# Patient Record
Sex: Male | Born: 1985 | Race: White | Hispanic: No | Marital: Single | Smoking: Current every day smoker
Health system: Southern US, Community
[De-identification: ages and names within clinical notes are randomized; demographics above are authoritative.]

## PROBLEM LIST (undated history)

## (undated) DIAGNOSIS — F1220 Cannabis dependence, uncomplicated: Secondary | ICD-10-CM

## (undated) DIAGNOSIS — E109 Type 1 diabetes mellitus without complications: Secondary | ICD-10-CM

## (undated) DIAGNOSIS — F12188 Cannabis abuse with other cannabis-induced disorder: Secondary | ICD-10-CM

## (undated) HISTORY — PX: TONSILLECTOMY: SUR1361

---

## 2014-10-04 ENCOUNTER — Inpatient Hospital Stay (HOSPITAL_COMMUNITY): Payer: Self-pay

## 2014-10-04 ENCOUNTER — Encounter (HOSPITAL_COMMUNITY): Payer: Self-pay | Admitting: Emergency Medicine

## 2014-10-04 ENCOUNTER — Inpatient Hospital Stay (HOSPITAL_COMMUNITY)
Admission: EM | Admit: 2014-10-04 | Discharge: 2014-10-04 | DRG: 639 | Disposition: A | Discharge status: Home / Self Care | Payer: Self-pay | Attending: Internal Medicine | Admitting: Internal Medicine

## 2014-10-04 DIAGNOSIS — Z794 Long term (current) use of insulin: Secondary | ICD-10-CM

## 2014-10-04 DIAGNOSIS — E860 Dehydration: Secondary | ICD-10-CM | POA: Diagnosis present

## 2014-10-04 DIAGNOSIS — D72829 Elevated white blood cell count, unspecified: Secondary | ICD-10-CM | POA: Diagnosis present

## 2014-10-04 DIAGNOSIS — E119 Type 2 diabetes mellitus without complications: Secondary | ICD-10-CM

## 2014-10-04 DIAGNOSIS — F17210 Nicotine dependence, cigarettes, uncomplicated: Secondary | ICD-10-CM | POA: Diagnosis present

## 2014-10-04 DIAGNOSIS — E872 Acidosis, unspecified: Secondary | ICD-10-CM

## 2014-10-04 DIAGNOSIS — E1110 Type 2 diabetes mellitus with ketoacidosis without coma: Secondary | ICD-10-CM | POA: Diagnosis present

## 2014-10-04 DIAGNOSIS — E1310 Other specified diabetes mellitus with ketoacidosis without coma: Principal | ICD-10-CM | POA: Diagnosis present

## 2014-10-04 DIAGNOSIS — IMO0001 Reserved for inherently not codable concepts without codable children: Secondary | ICD-10-CM

## 2014-10-04 LAB — COMPREHENSIVE METABOLIC PANEL
ALBUMIN: 4.7 g/dL (ref 3.5–5.2)
ALT: 13 U/L (ref 0–53)
ANION GAP: 33 — AB (ref 5–15)
AST: 14 U/L (ref 0–37)
Alkaline Phosphatase: 102 U/L (ref 39–117)
BUN: 21 mg/dL (ref 6–23)
CALCIUM: 9.6 mg/dL (ref 8.4–10.5)
CO2: 8 mEq/L — CL (ref 19–32)
CREATININE: 0.86 mg/dL (ref 0.50–1.35)
Chloride: 91 mEq/L — ABNORMAL LOW (ref 96–112)
GFR calc Af Amer: >90 mL/min (ref >90)
GFR calc non Af Amer: >90 mL/min (ref >90)
Glucose, Bld: 348 mg/dL — ABNORMAL HIGH (ref 70–99)
Potassium: 5.1 mEq/L (ref 3.7–5.3)
Sodium: 132 mEq/L — ABNORMAL LOW (ref 137–147)
Total Bilirubin: 0.9 mg/dL (ref 0.3–1.2)
Total Protein: 7.9 g/dL (ref 6.0–8.3)

## 2014-10-04 LAB — BLOOD GAS, VENOUS
Acid-base deficit: 22.5 mmol/L — ABNORMAL HIGH (ref 0.0–2.0)
Bicarbonate: 4.9 mEq/L — ABNORMAL LOW (ref 20.0–24.0)
O2 SAT: 96.1 %
PH VEN: 7.17 — AB (ref 7.250–7.300)
PO2 VEN: 95.2 mmHg — AB (ref 30.0–45.0)
Patient temperature: 98.6
TCO2: 4.8 mmol/L (ref 0–100)
pCO2, Ven: 14 mmHg — ABNORMAL LOW (ref 45.0–50.0)

## 2014-10-04 LAB — CBC
HEMATOCRIT: 48.7 % (ref 39.0–52.0)
HEMATOCRIT: 45.8 % (ref 39.0–52.0)
Hemoglobin: 15.9 g/dL (ref 13.0–17.0)
Hemoglobin: 14.7 g/dL (ref 13.0–17.0)
MCH: 31.1 pg (ref 26.0–34.0)
MCH: 30.6 pg (ref 26.0–34.0)
MCHC: 32.6 g/dL (ref 30.0–36.0)
MCHC: 32.1 g/dL (ref 30.0–36.0)
MCV: 95.3 fL (ref 78.0–100.0)
MCV: 95.4 fL (ref 78.0–100.0)
Platelets: 231 10*3/uL (ref 150–400)
Platelets: 197 10*3/uL (ref 150–400)
RBC: 5.11 MIL/uL (ref 4.22–5.81)
RBC: 4.8 MIL/uL (ref 4.22–5.81)
RDW: 13.1 % (ref 11.5–15.5)
RDW: 12.9 % (ref 11.5–15.5)
WBC: 11.4 10*3/uL — ABNORMAL HIGH (ref 4.0–10.5)
WBC: 11.8 10*3/uL — AB (ref 4.0–10.5)

## 2014-10-04 LAB — URINALYSIS, ROUTINE W REFLEX MICROSCOPIC
Bilirubin Urine: NEGATIVE
Glucose, UA: >1000 mg/dL — AB
Hgb urine dipstick: NEGATIVE
LEUKOCYTES UA: NEGATIVE
NITRITE: NEGATIVE
Protein, ur: NEGATIVE mg/dL
Specific Gravity, Urine: 1.028 (ref 1.005–1.030)
Urobilinogen, UA: 0.2 mg/dL (ref 0.0–1.0)
pH: 5 (ref 5.0–8.0)

## 2014-10-04 LAB — BASIC METABOLIC PANEL
Anion gap: 26 — ABNORMAL HIGH (ref 5–15)
Anion gap: 23 — ABNORMAL HIGH (ref 5–15)
BUN: 19 mg/dL (ref 6–23)
BUN: 16 mg/dL (ref 6–23)
CHLORIDE: 98 meq/L (ref 96–112)
CO2: 8 meq/L — AB (ref 19–32)
CO2: 11 mEq/L — ABNORMAL LOW (ref 19–32)
Calcium: 8.4 mg/dL (ref 8.4–10.5)
Calcium: 7.8 mg/dL — ABNORMAL LOW (ref 8.4–10.5)
Chloride: 100 mEq/L (ref 96–112)
Creatinine, Ser: 0.88 mg/dL (ref 0.50–1.35)
Creatinine, Ser: 0.77 mg/dL (ref 0.50–1.35)
GFR calc Af Amer: >90 mL/min (ref >90)
GFR calc non Af Amer: >90 mL/min (ref >90)
Glucose, Bld: 261 mg/dL — ABNORMAL HIGH (ref 70–99)
Glucose, Bld: 265 mg/dL — ABNORMAL HIGH (ref 70–99)
POTASSIUM: 4.6 meq/L (ref 3.7–5.3)
Potassium: 5 mEq/L (ref 3.7–5.3)
SODIUM: 134 meq/L — AB (ref 137–147)
Sodium: 132 mEq/L — ABNORMAL LOW (ref 137–147)

## 2014-10-04 LAB — BLOOD GAS, ARTERIAL
ACID-BASE DEFICIT: 17.3 mmol/L — AB (ref 0.0–2.0)
BICARBONATE: 9 meq/L — AB (ref 20.0–24.0)
Drawn by: 235321
FIO2: 0.21 %
O2 Saturation: 97.8 %
PCO2 ART: 22.9 mmHg — AB (ref 35.0–45.0)
Patient temperature: 98.6
TCO2: 8.3 mmol/L (ref 0–100)
pH, Arterial: 7.222 — ABNORMAL LOW (ref 7.350–7.450)
pO2, Arterial: 111 mmHg — ABNORMAL HIGH (ref 80.0–100.0)

## 2014-10-04 LAB — CBG MONITORING, ED
GLUCOSE-CAPILLARY: 255 mg/dL — AB (ref 70–99)
GLUCOSE-CAPILLARY: 264 mg/dL — AB (ref 70–99)
Glucose-Capillary: 168 mg/dL — ABNORMAL HIGH (ref 70–99)
Glucose-Capillary: 220 mg/dL — ABNORMAL HIGH (ref 70–99)
Glucose-Capillary: 238 mg/dL — ABNORMAL HIGH (ref 70–99)
Glucose-Capillary: 260 mg/dL — ABNORMAL HIGH (ref 70–99)
Glucose-Capillary: 262 mg/dL — ABNORMAL HIGH (ref 70–99)
Glucose-Capillary: 322 mg/dL — ABNORMAL HIGH (ref 70–99)

## 2014-10-04 LAB — RAPID URINE DRUG SCREEN, HOSP PERFORMED
Amphetamines: NOT DETECTED
BARBITURATES: NOT DETECTED
Benzodiazepines: NOT DETECTED
COCAINE: POSITIVE — AB
OPIATES: NOT DETECTED
Tetrahydrocannabinol: NOT DETECTED

## 2014-10-04 LAB — MAGNESIUM: Magnesium: 1.8 mg/dL (ref 1.5–2.5)

## 2014-10-04 LAB — URINE MICROSCOPIC-ADD ON

## 2014-10-04 MED ORDER — SODIUM CHLORIDE 0.9 % IV SOLN
INTRAVENOUS | Status: DC
  Administered 2014-10-04: 4 [IU]/h via INTRAVENOUS
  Filled 2014-10-04: qty 2.5

## 2014-10-04 MED ORDER — DEXTROSE-NACL 5-0.45 % IV SOLN
INTRAVENOUS | Status: DC
  Administered 2014-10-04: 19:00:00 via INTRAVENOUS

## 2014-10-04 MED ORDER — SODIUM CHLORIDE 0.9 % IV SOLN
1000.0000 mL | INTRAVENOUS | Status: DC
  Administered 2014-10-04: 1000 mL via INTRAVENOUS

## 2014-10-04 MED ORDER — SODIUM CHLORIDE 0.9 % IV SOLN
1000.0000 mL | Freq: Once | INTRAVENOUS | Status: AC
  Administered 2014-10-04: 1000 mL via INTRAVENOUS

## 2014-10-04 MED ORDER — SODIUM CHLORIDE 0.9 % IV SOLN
INTRAVENOUS | Status: DC
  Administered 2014-10-04: 19:00:00 via INTRAVENOUS

## 2014-10-04 MED ORDER — ENOXAPARIN SODIUM 40 MG/0.4ML ~~LOC~~ SOLN
40.0000 mg | SUBCUTANEOUS | Status: DC
  Filled 2014-10-04: qty 0.4

## 2014-10-04 MED ORDER — SODIUM CHLORIDE 0.9 % IV BOLUS (SEPSIS)
1000.0000 mL | Freq: Once | INTRAVENOUS | Status: AC
  Administered 2014-10-04: 1000 mL via INTRAVENOUS

## 2014-10-04 MED ORDER — SODIUM CHLORIDE 0.9 % IV SOLN
INTRAVENOUS | Status: DC
  Administered 2014-10-04: 2 [IU]/h via INTRAVENOUS
  Filled 2014-10-04: qty 2.5

## 2014-10-04 MED ORDER — POTASSIUM CHLORIDE 10 MEQ/100ML IV SOLN
10.0000 meq | INTRAVENOUS | Status: AC
  Administered 2014-10-04 (×2): 10 meq via INTRAVENOUS
  Filled 2014-10-04 (×2): qty 100

## 2014-10-04 MED ORDER — SODIUM CHLORIDE 0.9 % IV SOLN
INTRAVENOUS | Status: AC
  Administered 2014-10-04: 1000 mL via INTRAVENOUS

## 2014-10-04 MED ORDER — DEXTROSE-NACL 5-0.45 % IV SOLN: INTRAVENOUS | Status: DC

## 2014-10-04 NOTE — ED Provider Notes (Signed)
CSN: [id number]     Arrival date & time 10/04/14  1358 History   First MD Initiated Contact with Patient 10/04/14 1505     Chief Complaint  Patient presents with  . Diabetic Ketoacidosis?   Marland Kitchen Hyperglycemia     (Consider location/radiation/quality/duration/timing/severity/associated sxs/prior Treatment) The history is provided by the patient and medical records. No language interpreter was used.     Zachary Vazquez is a 28 y.o. male  with a hx of IDDM presents to the Emergency Department complaining of gradual, persistent, progressively worsening generalized weakness and feeling dehydrated onset 2 days.   Pt reports he is traveling from [place] and hasn't had his insulin in 3 days.  Associated symptoms include fatigue, polyuria and polydipsia.  Patient reports history of same with his last hospitalization in March 2015. He reports he's been an insulin-dependent diabetic for the last 3 years with intermittent bouts of DKA.  No aggravating or alleviating factors. Patient denies fever, chills, headache, neck pain, chest pain, shortness of breath, abdominal pain, nausea, vomiting, diarrhea, syncope, dysuria.   Past Medical History  Diagnosis Date  . Diabetes mellitus without complication    Past Surgical History  Procedure Laterality Date  . Tonsillectomy     History reviewed. No pertinent family history. History  Substance Use Topics  . Smoking status: Current Every Day Smoker -- 1.00 packs/day    Types: Cigarettes  . Smokeless tobacco: Not on file  . Alcohol Use: Yes    Review of Systems  Constitutional: Positive for fatigue. Negative for fever, diaphoresis, appetite change and unexpected weight change.  HENT: Negative for mouth sores.   Eyes: Negative for visual disturbance.  Respiratory: Negative for cough, chest tightness, shortness of breath and wheezing.   Cardiovascular: Negative for chest pain.  Gastrointestinal: Negative for nausea, vomiting, abdominal pain, diarrhea and  constipation.  Endocrine: Positive for polydipsia and polyuria.  Genitourinary: Negative for dysuria, urgency, frequency and hematuria.  Musculoskeletal: Negative for back pain and neck stiffness.  Skin: Negative for rash.  Allergic/Immunologic: Negative for immunocompromised state.  Neurological: Positive for weakness. Negative for syncope, light-headedness and headaches.  Hematological: Does not bruise/bleed easily.  Psychiatric/Behavioral: Negative for sleep disturbance. The patient is not nervous/anxious.       Allergies  Clindamycin/lincomycin  Home Medications   Prior to Admission medications   Medication Sig Start Date End Date Taking? Authorizing Provider  insulin aspart (NOVOLOG) 100 UNIT/ML injection Inject 2-15 Units into the skin 3 (three) times daily with meals.   Yes Historical Provider, MD  insulin glargine (LANTUS) 100 UNIT/ML injection Inject 28 Units into the skin at bedtime.   Yes Historical Provider, MD   BP 102/52 mmHg  Pulse 94  Temp(Src) 97.6 F (36.4 C) (Oral)  Resp 20  Ht 6' (1.829 m)  Wt 160 lb (72.576 kg)  BMI 21.70 kg/m2  SpO2 100% Physical Exam  Constitutional: He appears well-developed and well-nourished. No distress.  Awake, alert, nontoxic appearance  HENT:  Head: Normocephalic and atraumatic.  Mouth/Throat: Oropharynx is clear and moist. Mucous membranes are dry. No oropharyngeal exudate.  Dry mucous membranes  Eyes: Conjunctivae are normal. No scleral icterus.  Neck: Normal range of motion. Neck supple.  Cardiovascular: Normal rate, regular rhythm and intact distal pulses.   Pulmonary/Chest: Effort normal and breath sounds normal. No respiratory distress. He has no wheezes.  Equal chest expansion  Abdominal: Soft. Bowel sounds are normal. He exhibits no mass. There is no tenderness. There is no rebound and no guarding.  Musculoskeletal: Normal range of motion. He exhibits no edema.  Neurological: He is alert.  Speech is clear and goal  oriented Moves extremities without ataxia  Skin: Skin is warm and dry. He is not diaphoretic.  Psychiatric: He has a normal mood and affect.  Nursing note and vitals reviewed.   ED Course  Procedures (including critical care time) Labs Review Labs Reviewed  CBC - Abnormal; Notable for the following:    WBC 11.4 (*)    All other components within normal limits  COMPREHENSIVE METABOLIC PANEL - Abnormal; Notable for the following:    Sodium 132 (*)    Chloride 91 (*)    CO2 8 (*)    Glucose, Bld 348 (*)    Anion gap 33 (*)    All other components within normal limits  URINALYSIS, ROUTINE W REFLEX MICROSCOPIC - Abnormal; Notable for the following:    Glucose, UA >1000 (*)    Ketones, ur >80 (*)    All other components within normal limits  BLOOD GAS, VENOUS - Abnormal; Notable for the following:    pH, Ven 7.170 (*)    pCO2, Ven 14.0 (*)    pO2, Ven 95.2 (*)    Bicarbonate 4.9 (*)    Acid-base deficit 22.5 (*)    All other components within normal limits  CBG MONITORING, ED - Abnormal; Notable for the following:    Glucose-Capillary 322 (*)    All other components within normal limits  CBG MONITORING, ED - Abnormal; Notable for the following:    Glucose-Capillary 255 (*)    All other components within normal limits  CBG MONITORING, ED - Abnormal; Notable for the following:    Glucose-Capillary 260 (*)    All other components within normal limits  URINE MICROSCOPIC-ADD ON    Imaging Review No results found.   EKG Interpretation None      CRITICAL CARE Performed by: Dierdre ForthMuthersbaugh, Aarik Blank Total critical care time: 40min Critical care time was exclusive of separately billable procedures and treating other patients. Critical care was necessary to treat or prevent imminent or life-threatening deterioration. Critical care was time spent personally by me on the following activities: development of treatment plan with patient and/or surrogate as well as nursing, discussions  with consultants, evaluation of patient's response to treatment, examination of patient, obtaining history from patient or surrogate, ordering and performing treatments and interventions, ordering and review of laboratory studies, ordering and review of radiographic studies, pulse oximetry and re-evaluation of patient's condition.   MDM   Final diagnoses:  Diabetic ketoacidosis without coma associated with other specified diabetes mellitus  Acidosis, metabolic  IDDM (insulin dependent diabetes mellitus)   Zachary Vazquez presents with fatigue, history of insulin-dependent diabetes and no insulin for the last 3 days.  Labs with mild leukocytosis, significantly low CO2 on the CMP and an anion gap of 33. Glucose level 348. Awaiting VBG or UA however suspect DKA. Will give fluids and plan for glucose stabilizer and admission.  4:15PM PH 7.17 and Bicarb 4.9.  Pt to be admitted.  Fluids and glucostabilizer running at this time.  6:00PM Discussed with Dr. Susie CassetteAbrol who has concerns about pH.  Will consult PCCM.  Pt reports he feels much better.    6:19 PM Disucssed with PCCM who recommends step-down bed.  Reconsulted with Dr. Susie CassetteAbrol who will accept for [facility] - pt to step-down.   The patient was discussed with and seen by Dr. Gwendolyn GrantWalden who agrees with the treatment plan.  Zachary ClientHannah Wilbert Schouten, PA-C 10/04/14 1822  Elwin MochaBlair Walden, MD 10/04/14 2118

## 2014-10-04 NOTE — ED Notes (Signed)
Pt. Giving a cup of water

## 2014-10-04 NOTE — H&P (Addendum)
[facility] History and Physical  Nayden Kunsman ZOX:[id number] DOBGwenyth Ober: 04/29/1986 DOA: 10/04/2014  Referring physician:   PCP: No primary care provider on file.   Chief Complaint: Dehydration  HPI:  28 year old male with a history of insulin-dependent diabetes mellitus who presents to the ER with persistent progressive worsening generalized weakness for the last 2 days. Patient is visiting from [place] and has not taken his insulin in the last 3 days. Denies any intercurrent illness, he will, shortness of breath, chest pain. He does complain of polyuria polydipsia and fatigue. He does have a previous history of hospitalization for DKA, most recently in March. In the ED the patient was found to have a pH of 7.17 on venous blood gas and a bicarbonate of 8.Marland Kitchen. PCCM evaluated the patient and thought that the patient could be admitted by [facility] hospitalist on a step down unit. PCCM  Aware .    Review of Systems  Constitutional: Positive for fatigue. Negative for fever, diaphoresis, appetite change and unexpected weight change.  HENT: Negative for mouth sores.  Eyes: Negative for visual disturbance.  Respiratory: Negative for cough, chest tightness, shortness of breath and wheezing.  Cardiovascular: Negative for chest pain.  Gastrointestinal: Negative for nausea, vomiting, abdominal pain, diarrhea and constipation.  Endocrine: Positive for polydipsia and polyuria.  Genitourinary: Negative for dysuria, urgency, frequency and hematuria.  Musculoskeletal: Negative for back pain and neck stiffness.  Skin: Negative for rash.  Allergic/Immunologic: Negative for immunocompromised state.  Neurological: Positive for weakness. Negative for syncope, light-headedness and headaches.  Hematological: Does not bruise/bleed easily.  Psychiatric/Behavioral: Negative for sleep disturbance. The patient is not nervous/anxious.       Past Medical History  Diagnosis Date  . Diabetes mellitus without  complication      Past Surgical History  Procedure Laterality Date  . Tonsillectomy        Social History:  reports that he has been smoking Cigarettes.  He has been smoking about 1.00 pack per day. He does not have any smokeless tobacco history on file. He reports that he drinks alcohol. His drug history is not on file.    Allergies  Allergen Reactions  . Clindamycin/Lincomycin Anaphylaxis    History reviewed. No pertinent family history.   Prior to Admission medications   Medication Sig Start Date End Date Taking? Authorizing Provider  insulin aspart (NOVOLOG) 100 UNIT/ML injection Inject 2-15 Units into the skin 3 (three) times daily with meals.   Yes Historical Provider, MD  insulin glargine (LANTUS) 100 UNIT/ML injection Inject 28 Units into the skin at bedtime.   Yes Historical Provider, MD     Physical Exam: Filed Vitals:   10/04/14 1600 10/04/14 1630 10/04/14 1700 10/04/14 1730  BP: 108/77 125/59 137/67 102/52  Pulse: 100 92 99 94  Temp:      TempSrc:      Resp:      Height:      Weight:      SpO2: 100% 100% 100% 100%   Constitutional: He appears well-developed and well-nourished. No distress.  Awake, alert, nontoxic appearance  HENT:  Head: Normocephalic and atraumatic.  Mouth/Throat: Oropharynx is clear and moist. Mucous membranes are dry. No oropharyngeal exudate.  Dry mucous membranes  Eyes: Conjunctivae are normal. No scleral icterus.  Neck: Normal range of motion. Neck supple.  Cardiovascular: Normal rate, regular rhythm and intact distal pulses.  Pulmonary/Chest: Effort normal and breath sounds normal. No respiratory distress. He has no wheezes.  Equal chest expansion  Abdominal: Soft.  Bowel sounds are normal. He exhibits no mass. There is no tenderness. There is no rebound and no guarding.  Musculoskeletal: Normal range of motion. He exhibits no edema.  Neurological: He is alert.  Speech is clear and goal oriented Moves extremities without  ataxia  Skin: Skin is warm and dry. He is not diaphoretic.  Psychiatric: He has a normal mood and affect.  Nursing note and vitals reviewed.       Labs on Admission:    Basic Metabolic Panel:  Recent Labs Lab 10/04/14 1425  NA 132*  K 5.1  CL 91*  CO2 8*  GLUCOSE 348*  BUN 21  CREATININE 0.86  CALCIUM 9.6   Liver Function Tests:  Recent Labs Lab 10/04/14 1425  AST 14  ALT 13  ALKPHOS 102  BILITOT 0.9  PROT 7.9  ALBUMIN 4.7   No results for input(s): LIPASE, AMYLASE in the last 168 hours. No results for input(s): AMMONIA in the last 168 hours. CBC:  Recent Labs Lab 10/04/14 1425  WBC 11.4*  HGB 15.9  HCT 48.7  MCV 95.3  PLT 231   Cardiac Enzymes: No results for input(s): CKTOTAL, CKMB, CKMBINDEX, TROPONINI in the last 168 hours.  BNP (last 3 results) No results for input(s): PROBNP in the last 8760 hours.    CBG:  Recent Labs Lab 10/04/14 1405 10/04/14 1656 10/04/14 1803  GLUCAP 322* 255* 260*    Radiological Exams on Admission: No results found.  EKG: Independently reviewed.  Assessment/Plan Active Problems:   DKA (diabetic ketoacidoses)   Severe Diabetic ketoacidosis Patient will be admitted to step down per DKa protocol Labs with mild leukocytosis, significantly low CO2 on the CMP and an anion gap of 33. Glucose level 348. Patient will be started on insulin drip Aggressive IV hydration, transition to D5 half normal with CBG less than 250 Repeat ABG tonight, ABG in the morning BMP every 4 hours, per DKA protocol Patient on NovoLog and Lantus at home Diabetic coordinator consult Chest x-ray to rule out underlying infection   Code Status:   full Family Communication: bedside Disposition Plan: admit   Time spent: 70 mins   [facility] CreekBROL,Chassie Pennix [facility] Pager [phone]  If 7PM-7AM, please contact night-coverage [email or web address] Password [email or web address] 10/04/2014, 6:42 PM

## 2014-10-04 NOTE — ED Notes (Addendum)
Per patient-hasn't taken insulin in three days (visiting from [place]). Feels weak, dehydrated and has been in DKA before-says this is how it feels. Denies SOB, dizziness, chest pain. Feels weak. No other questions/concerns. RR even/unlabored. Awaiting MD.

## 2014-10-04 NOTE — ED Notes (Signed)
Patient repeatedly tells this nurse that he is going home.  Educated him about the risks of leaving without insulin at home.

## 2014-10-04 NOTE — ED Notes (Signed)
Pt. Is unable to go to the restroom at this time, but is aware that we need a urine specimen.

## 2014-10-05 LAB — HEMOGLOBIN A1C
HEMOGLOBIN A1C: 12.5 % — AB (ref <5.7)
Mean Plasma Glucose: 312 mg/dL — ABNORMAL HIGH (ref <117)

## 2016-06-30 ENCOUNTER — Inpatient Hospital Stay
Admission: EM | Admit: 2016-06-30 | Discharge: 2016-07-02 | DRG: 638 | Disposition: A | Discharge status: Home / Self Care | Payer: Medicaid - Out of State | Attending: Internal Medicine | Admitting: Internal Medicine

## 2016-06-30 ENCOUNTER — Encounter: Payer: Self-pay | Admitting: *Deleted

## 2016-06-30 DIAGNOSIS — Z888 Allergy status to other drugs, medicaments and biological substances status: Secondary | ICD-10-CM

## 2016-06-30 DIAGNOSIS — N179 Acute kidney failure, unspecified: Secondary | ICD-10-CM | POA: Diagnosis present

## 2016-06-30 DIAGNOSIS — R000 Tachycardia, unspecified: Secondary | ICD-10-CM | POA: Diagnosis present

## 2016-06-30 DIAGNOSIS — R7989 Other specified abnormal findings of blood chemistry: Secondary | ICD-10-CM | POA: Diagnosis present

## 2016-06-30 DIAGNOSIS — E876 Hypokalemia: Secondary | ICD-10-CM | POA: Diagnosis present

## 2016-06-30 DIAGNOSIS — E1010 Type 1 diabetes mellitus with ketoacidosis without coma: Principal | ICD-10-CM | POA: Diagnosis present

## 2016-06-30 DIAGNOSIS — D72829 Elevated white blood cell count, unspecified: Secondary | ICD-10-CM | POA: Diagnosis present

## 2016-06-30 DIAGNOSIS — F17210 Nicotine dependence, cigarettes, uncomplicated: Secondary | ICD-10-CM | POA: Diagnosis present

## 2016-06-30 DIAGNOSIS — Z794 Long term (current) use of insulin: Secondary | ICD-10-CM

## 2016-06-30 HISTORY — DX: Type 1 diabetes mellitus without complications: E10.9

## 2016-06-30 LAB — CBC WITH DIFFERENTIAL/PLATELET
BASOS PCT: 0 %
Basophils Absolute: 0.1 10*3/uL (ref 0–0.1)
Eosinophils Absolute: 0.1 10*3/uL (ref 0–0.7)
Eosinophils Relative: 1 %
HCT: 46.6 % (ref 40.0–52.0)
HEMOGLOBIN: 15.4 g/dL (ref 13.0–18.0)
Lymphocytes Relative: 16 %
Lymphs Abs: 2.3 10*3/uL (ref 1.0–3.6)
MCH: 32.2 pg (ref 26.0–34.0)
MCHC: 32.9 g/dL (ref 32.0–36.0)
MCV: 97.8 fL (ref 80.0–100.0)
MONOS PCT: 4 %
Monocytes Absolute: 0.6 10*3/uL (ref 0.2–1.0)
NEUTROS ABS: 11.6 10*3/uL — AB (ref 1.4–6.5)
NEUTROS PCT: 79 %
Platelets: 301 10*3/uL (ref 150–440)
RBC: 4.77 MIL/uL (ref 4.40–5.90)
RDW: 13.2 % (ref 11.5–14.5)
WBC: 14.6 10*3/uL — ABNORMAL HIGH (ref 3.8–10.6)

## 2016-06-30 LAB — COMPREHENSIVE METABOLIC PANEL
ALBUMIN: 4.7 g/dL (ref 3.5–5.0)
ALK PHOS: 83 U/L (ref 38–126)
ALT: 68 U/L — AB (ref 17–63)
AST: 116 U/L — ABNORMAL HIGH (ref 15–41)
Anion gap: 26 — ABNORMAL HIGH (ref 5–15)
BILIRUBIN TOTAL: 3.3 mg/dL — AB (ref 0.3–1.2)
BUN: 19 mg/dL (ref 6–20)
CALCIUM: 9.3 mg/dL (ref 8.9–10.3)
CO2: 11 mmol/L — AB (ref 22–32)
CREATININE: 1.28 mg/dL — AB (ref 0.61–1.24)
Chloride: 97 mmol/L — ABNORMAL LOW (ref 101–111)
GFR calc Af Amer: >60 mL/min (ref >60)
GFR calc non Af Amer: >60 mL/min (ref >60)
GLUCOSE: 545 mg/dL — AB (ref 65–99)
Potassium: 4.8 mmol/L (ref 3.5–5.1)
Sodium: 134 mmol/L — ABNORMAL LOW (ref 135–145)
TOTAL PROTEIN: 7.7 g/dL (ref 6.5–8.1)

## 2016-06-30 LAB — BLOOD GAS, VENOUS
Acid-base deficit: 16.6 mmol/L — ABNORMAL HIGH (ref 0.0–2.0)
BICARBONATE: 10.7 mmol/L — AB (ref 20.0–28.0)
O2 Saturation: 62.8 %
PO2 VEN: 43 mmHg (ref 32.0–45.0)
Patient temperature: 37
pCO2, Ven: 30 mmHg — ABNORMAL LOW (ref 44.0–60.0)
pH, Ven: 7.16 — CL (ref 7.250–7.430)

## 2016-06-30 LAB — GLUCOSE, CAPILLARY: Glucose-Capillary: 577 mg/dL (ref 65–99)

## 2016-06-30 MED ORDER — ONDANSETRON HCL 4 MG/2ML IJ SOLN
4.0000 mg | Freq: Once | INTRAMUSCULAR | Status: AC
Start: 2016-06-30 — End: 2016-06-30
  Administered 2016-06-30: 4 mg via INTRAVENOUS
  Filled 2016-06-30: qty 2

## 2016-06-30 MED ORDER — SODIUM CHLORIDE 0.9 % IV SOLN
INTRAVENOUS | Status: DC
  Administered 2016-07-01: 4.6 [IU]/h via INTRAVENOUS
  Filled 2016-06-30: qty 2.5

## 2016-06-30 MED ORDER — SODIUM CHLORIDE 0.9 % IV BOLUS (SEPSIS)
2000.0000 mL | Freq: Once | INTRAVENOUS | Status: AC
  Administered 2016-06-30: 2000 mL via INTRAVENOUS

## 2016-06-30 NOTE — ED Notes (Signed)
Pt was dx with dm 4 yrs ago. Has no insurance so only takes his meds at night and does not check his sugar. Has been admitted with dka in the past. C/o n/v and weakness.

## 2016-06-30 NOTE — ED Triage Notes (Addendum)
Pt presents w/ cbg > 500, vomiting x 3, pale and weak. Pt does not monitor blood sugar at home, just takes his insulin as it is rx'd at night. Pt is pale and slightly diaphoretic, nauseated and vomiting in triage.

## 2016-06-30 NOTE — ED Provider Notes (Signed)
[facility] Emergency Department Provider Note   ____________________________________________   First MD Initiated Contact with Patient 06/30/16 2247     (approximate)  I have reviewed the triage vital signs and the nursing notes.   HISTORY  Chief Complaint Hyperglycemia and Emesis   HPI Zachary Vazquez is a 30 y.o. male with a history of insulin-dependent diabetes who is presenting to the emergency department today with nausea vomiting and weakness. He says that he knows he is in DKA. Has had DKA as recently as several months ago. The patient says he does not check his blood sugar at home but eats whatever he wants and takes his insulin without any reference from an Accu-Chek. He says that he has vomited multiple times today. Denies any pain.   Past Medical History:  Diagnosis Date  . Diabetes mellitus without complication [facility])     Patient Active Problem List   Diagnosis Date Noted  . DKA (diabetic ketoacidoses) (HCC) 10/04/2014    Past Surgical History:  Procedure Laterality Date  . TONSILLECTOMY      Prior to Admission medications   Medication Sig Start Date End Date Taking? Authorizing Provider  insulin aspart (NOVOLOG) 100 UNIT/ML injection Inject 2-15 Units into the skin 3 (three) times daily with meals.    Historical Provider, MD  insulin glargine (LANTUS) 100 UNIT/ML injection Inject 28 Units into the skin at bedtime.    Historical Provider, MD    Allergies Clindamycin/lincomycin  History reviewed. No pertinent family history.  Social History Social History  Substance Use Topics  . Smoking status: Current Every Day Smoker    Packs/day: 1.00    Types: Cigarettes  . Smokeless tobacco: Never Used  . Alcohol use Yes    Review of Systems Constitutional: No fever/chills Eyes: No visual changes. ENT: No sore throat. Cardiovascular: Denies chest pain. Respiratory: Denies shortness of breath. Gastrointestinal: No abdominal pain.     No diarrhea.  No constipation. Genitourinary: Negative for dysuria. Musculoskeletal: Negative for back pain. Skin: Negative for rash. Neurological: Negative for headaches, focal weakness or numbness.  10-point ROS otherwise negative.  ____________________________________________   PHYSICAL EXAM:  VITAL SIGNS: ED Triage Vitals  Enc Vitals Group     BP 06/30/16 2230 (!) 143/67     Pulse Rate 06/30/16 2230 (!) 116     Resp 06/30/16 2230 (!) 26     Temp 06/30/16 2230 98.3 F (36.8 C)     Temp Source 06/30/16 2230 Oral     SpO2 06/30/16 2230 98 %     Weight 06/30/16 2233 170 lb (77.1 kg)     Height 06/30/16 2233 5\' 11"  (1.803 m)     Head Circumference --      Peak Flow --      Pain Score --      Pain Loc --      Pain Edu? --      Excl. in GC? --     Constitutional: Alert and oriented. Weak and ill-appearing. Eyes: Conjunctivae are normal. PERRL. EOMI. Head: Atraumatic. Nose: No congestion/rhinnorhea. Mouth/Throat: Mucous membranes are moist.  Oropharynx non-erythematous. Neck: No stridor.   Cardiovascular: Tachycardic, regular rhythm. Grossly normal heart sounds.  Respiratory: Normal respiratory effort.  No retractions. Lungs CTAB. Gastrointestinal: Soft and nontender. No distention. No abdominal bruits. No CVA tenderness. Musculoskeletal: No lower extremity tenderness nor edema.  No joint effusions. Neurologic:  Normal speech and language. No gross focal neurologic deficits are appreciated.  Skin:  Skin is  warm, dry and intact. No rash noted. Psychiatric: Mood and affect are normal. Speech and behavior are normal.  ____________________________________________   LABS (all labs ordered are listed, but only abnormal results are displayed)  Labs Reviewed  GLUCOSE, CAPILLARY - Abnormal; Notable for the following:       Result Value   Glucose-Capillary 577 (*)    All other components within normal limits  BLOOD GAS, VENOUS - Abnormal; Notable for the following:     pH, Ven 7.16 (*)    pCO2, Ven 30 (*)    Bicarbonate 10.7 (*)    Acid-base deficit 16.6 (*)    All other components within normal limits  CBC WITH DIFFERENTIAL/PLATELET - Abnormal; Notable for the following:    WBC 14.6 (*)    Neutro Abs 11.6 (*)    All other components within normal limits  COMPREHENSIVE METABOLIC PANEL - Abnormal; Notable for the following:    Sodium 134 (*)    Chloride 97 (*)    CO2 11 (*)    Glucose, Bld 545 (*)    Creatinine, Ser 1.28 (*)    AST 116 (*)    ALT 68 (*)    Total Bilirubin 3.3 (*)    Anion gap 26 (*)    All other components within normal limits  URINALYSIS COMPLETEWITH MICROSCOPIC ([facility] ONLY)   ____________________________________________  EKG   ____________________________________________  RADIOLOGY   ____________________________________________   PROCEDURES  Procedure(s) performed:  CRITICAL CARE Performed by: Arelia LongestSchaevitz,  Namish Krise M   Total critical care time: 35 minutes  Critical care time was exclusive of separately billable procedures and treating other patients.  Critical care was necessary to treat or prevent imminent or life-threatening deterioration.  Critical care was time spent personally by me on the following activities: development of treatment plan with patient and/or surrogate as well as nursing, discussions with consultants, evaluation of patient's response to treatment, examination of patient, obtaining history from patient or surrogate, ordering and performing treatments and interventions, ordering and review of laboratory studies, ordering and review of radiographic studies, pulse oximetry and re-evaluation of patient's condition.   Procedures  Critical Care performed: ____________________________________________   INITIAL IMPRESSION / ASSESSMENT AND PLAN / ED COURSE  Pertinent labs & imaging results that were available during my care of the patient were reviewed by me and considered in my medical decision  making (see chart for details).  ----------------------------------------- 11:05 PM on 06/30/2016 -----------------------------------------  Patient still pending metabolic panel results at this time. Started on 2 L of bolused IV normal saline. Presentation consistent with DKA. Signed out to Dr. Zenda AlpersWebster. Plan to likely admit on insulin drip.  Clinical Course     ____________________________________________   FINAL CLINICAL IMPRESSION(S) / ED DIAGNOSES  DKA.    NEW MEDICATIONS STARTED DURING THIS VISIT:  New Prescriptions   No medications on file     Note:  This document was prepared using Dragon voice recognition software and may include unintentional dictation errors.    Myrna Blazeravid Matthew Kylen Ismael, MD 06/30/16 [phone]

## 2016-07-01 ENCOUNTER — Encounter: Payer: Self-pay | Admitting: Adult Health

## 2016-07-01 DIAGNOSIS — E1010 Type 1 diabetes mellitus with ketoacidosis without coma: Principal | ICD-10-CM

## 2016-07-01 LAB — HEPATIC FUNCTION PANEL
ALK PHOS: 79 U/L (ref 38–126)
ALT: 68 U/L — ABNORMAL HIGH (ref 17–63)
AST: 93 U/L — ABNORMAL HIGH (ref 15–41)
Albumin: 4.4 g/dL (ref 3.5–5.0)
BILIRUBIN TOTAL: 1.6 mg/dL — AB (ref 0.3–1.2)
Total Protein: 7.6 g/dL (ref 6.5–8.1)

## 2016-07-01 LAB — URINE DRUG SCREEN, QUALITATIVE (ARMC ONLY)
Amphetamines, Ur Screen: NOT DETECTED
BARBITURATES, UR SCREEN: NOT DETECTED
BENZODIAZEPINE, UR SCRN: NOT DETECTED
Cannabinoid 50 Ng, Ur ~~LOC~~: POSITIVE — AB
Cocaine Metabolite,Ur ~~LOC~~: NOT DETECTED
MDMA (Ecstasy)Ur Screen: NOT DETECTED
Methadone Scn, Ur: NOT DETECTED
OPIATE, UR SCREEN: NOT DETECTED
PHENCYCLIDINE (PCP) UR S: NOT DETECTED
Tricyclic, Ur Screen: NOT DETECTED

## 2016-07-01 LAB — URINALYSIS COMPLETE WITH MICROSCOPIC (ARMC ONLY)
BACTERIA UA: NONE SEEN
BILIRUBIN URINE: NEGATIVE
Hgb urine dipstick: NEGATIVE
LEUKOCYTES UA: NEGATIVE
Nitrite: NEGATIVE
Protein, ur: NEGATIVE mg/dL
RBC / HPF: NONE SEEN RBC/hpf (ref 0–5)
SQUAMOUS EPITHELIAL / LPF: NONE SEEN
Specific Gravity, Urine: 1.018 (ref 1.005–1.030)
WBC, UA: NONE SEEN WBC/hpf (ref 0–5)
pH: 5 (ref 5.0–8.0)

## 2016-07-01 LAB — GLUCOSE, CAPILLARY
GLUCOSE-CAPILLARY: 172 mg/dL — AB (ref 65–99)
GLUCOSE-CAPILLARY: 190 mg/dL — AB (ref 65–99)
GLUCOSE-CAPILLARY: 197 mg/dL — AB (ref 65–99)
GLUCOSE-CAPILLARY: 209 mg/dL — AB (ref 65–99)
GLUCOSE-CAPILLARY: 328 mg/dL — AB (ref 65–99)
GLUCOSE-CAPILLARY: 351 mg/dL — AB (ref 65–99)
GLUCOSE-CAPILLARY: 522 mg/dL — AB (ref 65–99)
Glucose-Capillary: 333 mg/dL — ABNORMAL HIGH (ref 65–99)
Glucose-Capillary: 160 mg/dL — ABNORMAL HIGH (ref 65–99)
Glucose-Capillary: 149 mg/dL — ABNORMAL HIGH (ref 65–99)
Glucose-Capillary: 159 mg/dL — ABNORMAL HIGH (ref 65–99)
Glucose-Capillary: 148 mg/dL — ABNORMAL HIGH (ref 65–99)
Glucose-Capillary: 167 mg/dL — ABNORMAL HIGH (ref 65–99)
Glucose-Capillary: 182 mg/dL — ABNORMAL HIGH (ref 65–99)
Glucose-Capillary: 297 mg/dL — ABNORMAL HIGH (ref 65–99)
Glucose-Capillary: 347 mg/dL — ABNORMAL HIGH (ref 65–99)

## 2016-07-01 LAB — MRSA PCR SCREENING: MRSA by PCR: NEGATIVE

## 2016-07-01 LAB — PHOSPHORUS: PHOSPHORUS: 2.9 mg/dL (ref 2.5–4.6)

## 2016-07-01 LAB — BASIC METABOLIC PANEL
ANION GAP: 17 — AB (ref 5–15)
ANION GAP: 11 (ref 5–15)
BUN: 17 mg/dL (ref 6–20)
BUN: 14 mg/dL (ref 6–20)
CALCIUM: 8.5 mg/dL — AB (ref 8.9–10.3)
CALCIUM: 8.2 mg/dL — AB (ref 8.9–10.3)
CHLORIDE: 110 mmol/L (ref 101–111)
CHLORIDE: 109 mmol/L (ref 101–111)
CO2: 11 mmol/L — ABNORMAL LOW (ref 22–32)
CO2: 14 mmol/L — ABNORMAL LOW (ref 22–32)
CREATININE: 1.24 mg/dL (ref 0.61–1.24)
CREATININE: 0.98 mg/dL (ref 0.61–1.24)
GFR calc non Af Amer: >60 mL/min (ref >60)
GFR calc non Af Amer: >60 mL/min (ref >60)
Glucose, Bld: 204 mg/dL — ABNORMAL HIGH (ref 65–99)
Glucose, Bld: 157 mg/dL — ABNORMAL HIGH (ref 65–99)
Potassium: 4.8 mmol/L (ref 3.5–5.1)
Potassium: 4.2 mmol/L (ref 3.5–5.1)
SODIUM: 138 mmol/L (ref 135–145)
SODIUM: 134 mmol/L — AB (ref 135–145)

## 2016-07-01 LAB — LACTIC ACID, PLASMA: Lactic Acid, Venous: 1.3 mmol/L (ref 0.5–1.9)

## 2016-07-01 LAB — LIPASE, BLOOD: LIPASE: 15 U/L (ref 11–51)

## 2016-07-01 LAB — MAGNESIUM: MAGNESIUM: 1.9 mg/dL (ref 1.7–2.4)

## 2016-07-01 LAB — HEMOGLOBIN A1C: Hgb A1c MFr Bld: 10.9 % — ABNORMAL HIGH (ref 4.0–6.0)

## 2016-07-01 MED ORDER — SODIUM CHLORIDE 0.9 % IV SOLN
INTRAVENOUS | Status: DC
  Administered 2016-07-01 (×2): via INTRAVENOUS

## 2016-07-01 MED ORDER — INSULIN ASPART 100 UNIT/ML ~~LOC~~ SOLN
0.0000 [IU] | Freq: Three times a day (TID) | SUBCUTANEOUS | Status: DC
  Administered 2016-07-01: 2 [IU] via SUBCUTANEOUS
  Administered 2016-07-01: 5 [IU] via SUBCUTANEOUS
  Filled 2016-07-01: qty 5
  Filled 2016-07-01: qty 2

## 2016-07-01 MED ORDER — INSULIN ASPART 100 UNIT/ML ~~LOC~~ SOLN
0.0000 [IU] | Freq: Three times a day (TID) | SUBCUTANEOUS | Status: DC
  Administered 2016-07-01: 7 [IU] via SUBCUTANEOUS
  Administered 2016-07-02: 1 [IU] via SUBCUTANEOUS
  Administered 2016-07-02: 2 [IU] via SUBCUTANEOUS
  Filled 2016-07-01: qty 9
  Filled 2016-07-01 (×2): qty 2
  Filled 2016-07-01: qty 1
  Filled 2016-07-01: qty 2

## 2016-07-01 MED ORDER — INSULIN REGULAR HUMAN 100 UNIT/ML IJ SOLN: 2.0000 [IU] | Freq: Three times a day (TID) | INTRAMUSCULAR | 11 refills | Status: DC

## 2016-07-01 MED ORDER — INSULIN REGULAR HUMAN 100 UNIT/ML IJ SOLN
INTRAMUSCULAR | Status: DC
  Administered 2016-07-01: 1.6 [IU]/h via INTRAVENOUS

## 2016-07-01 MED ORDER — HEPARIN SODIUM (PORCINE) 5000 UNIT/ML IJ SOLN
5000.0000 [IU] | Freq: Three times a day (TID) | INTRAMUSCULAR | Status: DC
  Administered 2016-07-01 – 2016-07-02 (×4): 5000 [IU] via SUBCUTANEOUS
  Filled 2016-07-01 (×4): qty 1

## 2016-07-01 MED ORDER — INSULIN GLARGINE 100 UNIT/ML ~~LOC~~ SOLN
48.0000 [IU] | Freq: Every day | SUBCUTANEOUS | 11 refills | Status: DC
Start: 2016-07-01 — End: 2016-07-02

## 2016-07-01 MED ORDER — POTASSIUM CHLORIDE 10 MEQ/100ML IV SOLN
10.0000 meq | INTRAVENOUS | Status: AC
  Administered 2016-07-01 (×2): 10 meq via INTRAVENOUS
  Filled 2016-07-01 (×2): qty 100

## 2016-07-01 MED ORDER — INSULIN ASPART 100 UNIT/ML ~~LOC~~ SOLN
2.0000 [IU] | Freq: Three times a day (TID) | SUBCUTANEOUS | Status: DC
  Administered 2016-07-01 – 2016-07-02 (×4): 2 [IU] via SUBCUTANEOUS
  Filled 2016-07-01 (×4): qty 2

## 2016-07-01 MED ORDER — INSULIN GLARGINE 100 UNIT/ML ~~LOC~~ SOLN
20.0000 [IU] | Freq: Once | SUBCUTANEOUS | Status: AC
  Administered 2016-07-01: 20 [IU] via SUBCUTANEOUS
  Filled 2016-07-01: qty 0.2

## 2016-07-01 MED ORDER — INSULIN GLARGINE 100 UNIT/ML ~~LOC~~ SOLN
25.0000 [IU] | Freq: Every day | SUBCUTANEOUS | Status: DC
  Administered 2016-07-01: 25 [IU] via SUBCUTANEOUS
  Filled 2016-07-01 (×4): qty 0.25

## 2016-07-01 MED ORDER — DEXTROSE-NACL 5-0.45 % IV SOLN
INTRAVENOUS | Status: DC
  Administered 2016-07-01 (×2): via INTRAVENOUS

## 2016-07-01 NOTE — ED Provider Notes (Signed)
-----------------------------------------   1:10 AM on 07/01/2016 -----------------------------------------   Blood pressure (!) 143/79, pulse (!) 126, temperature 98.3 F (36.8 C), temperature source Oral, resp. rate (!) 26, height 5\' 11"  (1.803 m), weight 170 lb (77.1 kg), SpO2 97 %.  Assuming care from Dr. Pershing ProudSchaevitz.  In short, Zachary Vazquez is a 30 y.o. male with a chief complaint of Hyperglycemia and Emesis .  Refer to the original H&P for additional details.  The current plan of care is to follow up the results of the blood work and admit the patient.  It appears that the patient is in DKA. His pH is 7.16 and his bicarbonate is 11. I did start the patient on an insulin drip. The patient has a creatinine of 1.28 and an AST of 116 with an ALT of 68. The patient remains tachycardic but I will add some maintenance fluid and admit him to the intensive care unit. I did speak with the ICU attending who accepts the patient to the intensive care unit service.    Rebecka ApleyAllison P Webster, MD 07/01/16 0111

## 2016-07-01 NOTE — ED Notes (Signed)
Glucose witnessed by April, RN

## 2016-07-01 NOTE — Progress Notes (Signed)
Dr. Allena KatzPatel notified of bmp results. Orders for long acting insulin placed. Per Dr. Allena KatzPatel, if patient does well after lunch may be discharged. SW/CM to see patient about insulin outpatient. Zachary KusterBrandi R Mansfield

## 2016-07-01 NOTE — Progress Notes (Signed)
Patient transferring to [facility]. Report called to Clydie BraunKaren. Patient to be moved shortly. Patient has no complaints at this time. Trudee KusterBrandi R Mansfield

## 2016-07-01 NOTE — ED Notes (Signed)
Insulin independently verified by the RN

## 2016-07-01 NOTE — ED Notes (Signed)
Insulin drip rate independently verified by this rn and vanessa ashley, rn.

## 2016-07-01 NOTE — Care Management Note (Signed)
Case Management Note  Patient Details  Name: Zachary Vazquez MRN: [id number] Date of Birth: [birth year]  Subjective/Objective:                  Met with patient prior to discharge to home today as he appears to be self-pay. He states he has not had Medicaid for over 2 years. He agrees to assistance with [facility], [place], and [facility]. Patient states he lives with a long-term friend and house-hold income is less that $3,000/month. He states he has transportation and able to drive. His contact number is [phone]. He states he is aware of cost for glucometer supplies at [place] and states that his glucometer works and he already has supplies. He states he does not have Lantus but has "another insulin at home". He states he has trouble paying for his diabetic meds.  Action/Plan: I have instructed patient that [place] assistance is a one-time deal and if he didn't have to use it this visit may benefit him in the future. Medication management clinic is closed today due to the holiday. Patient has been instructed to follow up with [facility] tomorrow to become established. Application to both [facility] and [place] has been shared and explained to patient. I also shared Good[employer] discount card with patient. I attempted to put in [facility] assistance for patient and it would not go through. It says that patient is receiving COBRA health coverage. Patient will NOT be able to use MATCH assistance at this time. He has been made aware. No further RNCM needs.   Expected Discharge Date:  07/03/16               Expected Discharge Plan:     In-House Referral:     Discharge planning Services  CM Consult, Medication Assistance  Post Acute Care Choice:    Choice offered to:  Patient  DME Arranged:    DME Agency:     HH Arranged:    [place] Agency:     Status of Service:  In process, will continue to follow  If discussed at Long Length of Stay Meetings, dates discussed:    Additional Comments:  Marshell Garfinkel, RN 07/01/2016, 10:10 AM

## 2016-07-01 NOTE — H&P (Signed)
PULMONARY / CRITICAL CARE MEDICINE   Name: Zachary Vazquez MRN: [id number] DOB: 01/12/1986    ADMISSION DATE:  06/30/2016   REFERRING MD: Dr. Zenda AlpersWebster  CHIEF COMPLAINT:  "I felt really sick"  HISTORY OF PRESENT ILLNESS:   This is a 30 y/o caucasian male with a known h/o T1DM who presents with nausea, vomiting and weakness. He states that he ran out of insulin. He does not monitor his blood glucose and just takes his insulin at night. At the ED his blood glucose level was 545 mg/dl with an anion gap of 26, and a venous pH of 7.16. He does not have insurance and states that it is financially challenging for him to buy his insulin.  He reports significant improvement in nausea and vomiting.   PAST MEDICAL HISTORY :  He  has a past medical history of Type 1 diabetes mellitus (HCC).  PAST SURGICAL HISTORY: He  has a past surgical history that includes Tonsillectomy.  Allergies  Allergen Reactions  . Clindamycin/Lincomycin Anaphylaxis    No current facility-administered medications on file prior to encounter.    Current Outpatient Prescriptions on File Prior to Encounter  Medication Sig  . insulin glargine (LANTUS) 100 UNIT/ML injection Inject 48 Units into the skin at bedtime.     FAMILY HISTORY:  His has no family status information on file.    SOCIAL HISTORY: He  reports that he has been smoking Cigarettes.  He has been smoking about 1.00 pack per day. He has never used smokeless tobacco. He reports that he drinks alcohol. He reports that he uses drugs, including Marijuana.  REVIEW OF SYSTEMS:   Constitutional: Negative for fever and chills.  HENT: Negative for congestion and rhinorrhea.  Eyes: Negative for redness and visual disturbance.  Respiratory: Negative for shortness of breath and wheezing.  Cardiovascular: Negative for chest pain and palpitations.  Gastrointestinal: Positive  for nausea , vomiting and abdominal cramps but negative for loose stools Genitourinary:  Negative for dysuria and urgency.  Endocrine: Reports polyuria, polyphagia but denies heat intolerance Musculoskeletal: Negative for myalgias and arthralgias.  Skin: Negative for pallor and wound.  Neurological: Negative for dizziness and headaches   SUBJECTIVE:   VITAL SIGNS: BP 139/88   Pulse (!) 157   Temp 98.3 F (36.8 C) (Oral)   Resp (!) 26   Ht 5\' 11"  (1.803 m)   Wt 170 lb (77.1 kg)   SpO2 98%   BMI 23.71 kg/m   HEMODYNAMICS:    VENTILATOR SETTINGS:    INTAKE / OUTPUT: No intake/output data recorded.  PHYSICAL EXAMINATION: General: Acutely ill looking Neuro: AAO X4, no focal deficits HEENT: [place]/AT, PERRLA, conjunctivae pink, oral mucosa dry Cardiovascular: RRR, S1/S2, no MRG Lungs:  CTAB Abdomen: Non-distended, normal BS, no palpable organomegaly Musculoskeletal:  No deformities, +rom Extremities: No cyanosis, +2 pulses, no edema Skin: Warm and dry; no rash  LABS:  BMET  Recent Labs Lab 06/30/16 2259  NA 134*  K 4.8  CL 97*  CO2 11*  BUN 19  CREATININE 1.28*  GLUCOSE 545*    Electrolytes  Recent Labs Lab 06/30/16 2259  CALCIUM 9.3    CBC  Recent Labs Lab 06/30/16 2259  WBC 14.6*  HGB 15.4  HCT 46.6  PLT 301    Coag's No results for input(s): APTT, INR in the last 168 hours.  Sepsis Markers No results for input(s): LATICACIDVEN, PROCALCITON, O2SATVEN in the last 168 hours.  ABG No results for input(s): PHART, PCO2ART, PO2ART in  the last 168 hours.  Liver Enzymes  Recent Labs Lab 06/30/16 2259  AST 116*  ALT 68*  ALKPHOS 83  BILITOT 3.3*  ALBUMIN 4.7    Cardiac Enzymes No results for input(s): TROPONINI, PROBNP in the last 168 hours.  Glucose  Recent Labs Lab 06/30/16 2220 07/01/16 0029 07/01/16 0156  GLUCAP 577* 522* 328*    Imaging No results found.   STUDIES:  None  CULTURES: None  ANTIBIOTICS: None  SIGNIFICANT EVENTS: 07/01/16>ED with nausea, vomiting and  hyperglycemia  LINES/TUBES: PIVs  DISCUSSION: 30 y/o male presenting with DKA secondary to non-adherence  ASSESSMENT / PLAN:  CARDIOVASCULAR A:  Tachycardia-2/2volume depletion P:  Hemodynamics per ICU Volume replacement per DKA protocol EKG  RENAL A:   AKI-baseline creatinine is 0.9 P:   Trend creatinine IV and oral hydration as tolerated Monitor and correct electrolyte imbalances  GASTROINTESTINAL A:   Nausea and vomiting Elevated LFTs P:   Prn zofran Resume regular diet Trend LFTs  INFECTIOUS A:   Leukocytosis-afebrile P:   Monitor CBC and culture if febrile  ENDOCRINE A:   DKA Uncontrolled T1DM   P:   IV hydration per DKA protocol Monitor and replace potassium Insulin infusion and titrate to achieve blood glucose levels200mg /dl Social service consult for medication assistance and linkage to community resources Diabetes education consult for diabetes self-management  Pulmonary A:   Metabolic acidosis P:   Monitor respiratory status closely.  No indication for respiratory support at this time    Disposition and family update: Admit to icu. No family at bedside. Patient updated on current treatment plan   Magdalene S. [facility] ANP-BC Pulmonary and Critical Care Medicine [facility] Pager [phone] or [phone] 07/01/2016, 2:50 AM   Pt seen and examined with NP, agree with assessment and plan. Acute dka, on insulin drip. Pt otherwise stable, will transfer to hospitalist service, d/w Dr. Allena Katz.   Wells Guiles, M.D.  07/01/2016

## 2016-07-01 NOTE — Discharge Instructions (Signed)
°  DIET:  °Diabetic diet ° °DISCHARGE CONDITION:  °Stable ° °ACTIVITY:  °Activity as tolerated ° °OXYGEN:  °Home Oxygen: No. °  °Oxygen Delivery: room air ° °DISCHARGE LOCATION:  °home  ° ° °ADDITIONAL DISCHARGE INSTRUCTION: ° ° °If you experience worsening of your admission symptoms, develop shortness of breath, life threatening emergency, suicidal or homicidal thoughts you must seek medical attention immediately by calling 911 or calling your MD immediately  if symptoms less severe. ° °You Must read complete instructions/literature along with all the possible adverse reactions/side effects for all the Medicines you take and that have been prescribed to you. Take any new Medicines after you have completely understood and accpet all the possible adverse reactions/side effects.  ° °Please note ° °You were cared for by a hospitalist during your hospital stay. If you have any questions about your discharge medications or the care you received while you were in the hospital after you are discharged, you can call the unit and asked to speak with the hospitalist on call if the hospitalist that took care of you is not available. Once you are discharged, your primary care physician will handle any further medical issues. Please note that NO REFILLS for any discharge medications will be authorized once you are discharged, as it is imperative that you return to your primary care physician (or establish a relationship with a primary care physician if you do not have one) for your aftercare needs so that they can reassess your need for medications and monitor your lab values. ° ° °

## 2016-07-01 NOTE — Care Management (Addendum)
Message left for patient on his phone to go online to [email or web address] for discount card on Lantus. I have faxed discharge medications to Medication management (for tomorrow) IF they can assist patient since he has COBRA. Judy Hanks RN is going to contact Dr. Allena KatzPatel about DM meds also. I have emailed Darel HongJudy for copy of discounts cards as I am blocked in our system and cannot obtain copies of these cards through Lantus web page.

## 2016-07-01 NOTE — ED Notes (Signed)
Insulin dose independently verified by this rn.

## 2016-07-01 NOTE — Progress Notes (Signed)
[facility]                                                                                                                                                                                            Patient Demographics   Zachary OberBrandon Vazquez, is a 30 y.o. male, DOB - 01/02/1986, ZOX:[id number]  Admit date - 06/30/2016   Admitting Physician Shane CrutchPradeep Ramachandran, MD  Outpatient Primary MD for the patient is No PCP Per Patient   LOS - 0  Subjective:Patient feeling still little nauseous heart rate elevated no chest pain or palpitations     Review of Systems:   CONSTITUTIONAL: No documented fever. No fatigue, weakness. No weight gain, no weight loss.  EYES: No blurry or double vision.  ENT: No tinnitus. No postnasal drip. No redness of the oropharynx.  RESPIRATORY: No cough, no wheeze, no hemoptysis. No dyspnea.  CARDIOVASCULAR: No chest pain. No orthopnea. No palpitations. No syncope.  GASTROINTESTINAL: Positive nausea, no vomiting or diarrhea. No abdominal pain. No melena or hematochezia.  GENITOURINARY: No dysuria or hematuria.  ENDOCRINE: No polyuria or nocturia. No heat or cold intolerance.  HEMATOLOGY: No anemia. No bruising. No bleeding.  INTEGUMENTARY: No rashes. No lesions.  MUSCULOSKELETAL: No arthritis. No swelling. No gout.  NEUROLOGIC: No numbness, tingling, or ataxia. No seizure-type activity.  PSYCHIATRIC: No anxiety. No insomnia. No ADD.    Vitals:   Vitals:   07/01/16 0900 07/01/16 1000 07/01/16 1100 07/01/16 1200  BP: 120/81 (!) 95/51 104/60 114/73  Pulse: (!) 113 (!) 101 (!) 103 (!) 102  Resp: (!) 23 19 18 20   Temp:    98.1 F (36.7 C)  TempSrc:    Oral  SpO2: 99% 97% 97% 97%  Weight:      Height:        Wt Readings from Last 3 Encounters:  07/01/16 153 lb 7 oz (69.6 kg)  10/04/14 160 lb (72.6 kg)     Intake/Output Summary (Last 24 hours) at 07/01/16 1309 Last data filed at 07/01/16 1100  Gross per 24 hour   Intake          1188.23 ml  Output              625 ml  Net           563.23 ml    Physical Exam:   GENERAL: Pleasant-appearing in no apparent distress.  HEAD, EYES, EARS, NOSE AND THROAT: Atraumatic, normocephalic. Extraocular muscles are intact. Pupils equal and reactive to light. Sclerae anicteric. No conjunctival injection. No oro-pharyngeal erythema.  NECK: Supple. There is no jugular  venous distention. No bruits, no lymphadenopathy, no thyromegaly.  HEART: Regular rate and rhythm,. No murmurs, no rubs, no clicks.  LUNGS: Clear to auscultation bilaterally. No rales or rhonchi. No wheezes.  ABDOMEN: Soft, flat, nontender, nondistended. Has good bowel sounds. No hepatosplenomegaly appreciated.  EXTREMITIES: No evidence of any cyanosis, clubbing, or peripheral edema.  +2 pedal and radial pulses bilaterally.  NEUROLOGIC: The patient is alert, awake, and oriented x3 with no focal motor or sensory deficits appreciated bilaterally.  SKIN: Moist and warm with no rashes appreciated.  Psych: Not anxious, depressed LN: No inguinal LN enlargement    Antibiotics   Anti-infectives    None      Medications   Scheduled Meds: . heparin  5,000 Units Subcutaneous Q8H  . insulin aspart  0-9 Units Subcutaneous TID WC  . insulin aspart  2 Units Subcutaneous TID WC   Continuous Infusions: . sodium chloride Stopped (07/01/16 0400)  . dextrose 5 % and 0.45% NaCl Stopped (07/01/16 1224)  . insulin (NOVOLIN-R) infusion Stopped (07/01/16 1224)   PRN Meds:.   Data Review:   Micro Results Recent Results (from the past 240 hour(s))  MRSA PCR Screening     Status: None   Collection Time: 07/01/16  6:30 AM  Result Value Ref Range Status   MRSA by PCR NEGATIVE NEGATIVE Final    Comment:        The GeneXpert MRSA Assay (FDA approved for NASAL specimens only), is one component of a comprehensive MRSA colonization surveillance program. It is not intended to diagnose MRSA infection nor to  guide or monitor treatment for MRSA infections.     Radiology Reports No results found.   CBC  Recent Labs Lab 06/30/16 2259  WBC 14.6*  HGB 15.4  HCT 46.6  PLT 301  MCV 97.8  MCH 32.2  MCHC 32.9  RDW 13.2  LYMPHSABS 2.3  MONOABS 0.6  EOSABS 0.1  BASOSABS 0.1    Chemistries   Recent Labs Lab 06/30/16 2259 07/01/16 0423 07/01/16 0835  NA 134* 138 134*  K 4.8 4.8 4.2  CL 97* 110 109  CO2 11* 11* 14*  GLUCOSE 545* 204* 157*  BUN 19 17 14   CREATININE 1.28* 1.24 0.98  CALCIUM 9.3 8.5* 8.2*  MG  --  1.9  --   AST 116* 93*  --   ALT 68* 68*  --   ALKPHOS 83 79  --   BILITOT 3.3* 1.6*  --    ------------------------------------------------------------------------------------------------------------------ estimated creatinine clearance is 109.5 mL/min (by C-G formula based on SCr of 0.98 mg/dL). ------------------------------------------------------------------------------------------------------------------ No results for input(s): HGBA1C in the last 72 hours. ------------------------------------------------------------------------------------------------------------------ No results for input(s): CHOL, HDL, LDLCALC, TRIG, CHOLHDL, LDLDIRECT in the last 72 hours. ------------------------------------------------------------------------------------------------------------------ No results for input(s): TSH, T4TOTAL, T3FREE, THYROIDAB in the last 72 hours.  Invalid input(s): FREET3 ------------------------------------------------------------------------------------------------------------------ No results for input(s): VITAMINB12, FOLATE, FERRITIN, TIBC, IRON, RETICCTPCT in the last 72 hours.  Coagulation profile No results for input(s): INR, PROTIME in the last 168 hours.  No results for input(s): DDIMER in the last 72 hours.  Cardiac Enzymes No results for input(s): CKMB, TROPONINI, MYOGLOBIN in the last 168 hours.  Invalid input(s):  CK ------------------------------------------------------------------------------------------------------------------ Invalid input(s): POCBNP    Assessment & Plan   Pt is 30 y.o with DM1 admitted with DKA  1. DKA resolved Patient restarted on half of dose of his regular insulin this morning I will give him rest of the insulin dose today in the evening and then  tomorrow he'll need to start his usual home dose  Heart rate elevated I'll continue fluids Will need social worker assistance with discharge medications Will unable to get this done today  2. Sinus tachycardia continue IV fluids monitoring     Code Status Orders        Start     Ordered   07/01/16 0243  Full code  Continuous     07/01/16 0245    Code Status History    Date Active Date Inactive Code Status Order ID Comments User Context   10/04/2014  6:41 PM 10/05/2014  3:15 AM Full Code [id number]  Richarda Overlie, MD ED           Consults  None DVT Prophylaxis  Lovenox  Lab Results  Component Value Date   PLT 301 06/30/2016     Time Spent in minutes   Greater than 50% of time spent in care coordination and counseling patient regarding the condition and plan of care.   Auburn Bilberry M.D on 07/01/2016 at 1:09 PM  Between 7am to 6pm - Pager - [phone]  After 6pm go to [email or web address] - password [email or web address] [facility]  [facility]   Office  [phone]

## 2016-07-01 NOTE — Progress Notes (Signed)
Inpatient Diabetes Program Recommendations  AACE/ADA: New Consensus Statement on Inpatient Glycemic Control (2015)  Target Ranges:  Prepandial:   less than 140 mg/dL      Peak postprandial:   less than 180 mg/dL (1-2 hours)      Critically ill patients:  140 - 180 mg/dL   Lab Results  Component Value Date   GLUCAP 197 (H) 07/01/2016   HGBA1C 12.5 (H) 10/04/2014    Review of Glycemic Control  Diabetes history: DM 1 Outpatient Diabetes medications:  Current orders for Inpatient glycemic control: Lantus 20 units  Inpatient Diabetes Program Recommendations:  Patient is type 1, so please consider increase in Lantus insulin to 80% of basal home dose =38 units, adding meal coverage 4-5 units if eats 50%, and Novolog correction 0-9 units tid. If correction ordered, please give when insulin drip discontinued. Spoke with case manager and patient is unsure what type of insulin he has @ home. Sent copy of copay card for free month of Lantus per [employer] and also copy of 0$ copay card to case manager and she plans to find cards for patient in case he needs on discharge. Discussed with Dr. Allena KatzPatel and plans to have patient take additional insulin after discharged home.  Thank you, Zachary FischerJudy E. Jocabed Cheese, RN, MSN, CDE Inpatient Glycemic Control Team Team Pager [phone] (8am-5pm) 07/01/2016 11:44 AM

## 2016-07-02 LAB — BASIC METABOLIC PANEL
Anion gap: 5 (ref 5–15)
BUN: 13 mg/dL (ref 6–20)
CALCIUM: 8.2 mg/dL — AB (ref 8.9–10.3)
CO2: 24 mmol/L (ref 22–32)
Chloride: 107 mmol/L (ref 101–111)
Creatinine, Ser: 0.76 mg/dL (ref 0.61–1.24)
GFR calc Af Amer: >60 mL/min (ref >60)
GLUCOSE: 192 mg/dL — AB (ref 65–99)
POTASSIUM: 3.3 mmol/L — AB (ref 3.5–5.1)
Sodium: 136 mmol/L (ref 135–145)

## 2016-07-02 LAB — GLUCOSE, CAPILLARY
GLUCOSE-CAPILLARY: 190 mg/dL — AB (ref 65–99)
Glucose-Capillary: 137 mg/dL — ABNORMAL HIGH (ref 65–99)

## 2016-07-02 LAB — HEMOGLOBIN A1C: HEMOGLOBIN A1C: 11.5 % — AB (ref 4.0–6.0)

## 2016-07-02 MED ORDER — INSULIN ASPART 100 UNIT/ML ~~LOC~~ SOLN: 0.0000 [IU] | Freq: Three times a day (TID) | SUBCUTANEOUS | 11 refills | Status: DC

## 2016-07-02 MED ORDER — INSULIN ASPART 100 UNIT/ML ~~LOC~~ SOLN: 2.0000 [IU] | Freq: Three times a day (TID) | SUBCUTANEOUS | 11 refills | Status: DC

## 2016-07-02 MED ORDER — INSULIN GLARGINE 100 UNIT/ML ~~LOC~~ SOLN: 30.0000 [IU] | Freq: Every day | SUBCUTANEOUS | 11 refills | Status: DC

## 2016-07-02 NOTE — Care Management (Signed)
Verified that patient does not have COBRA insurance, and is in fact a self pay patient.  Plan for patient to discharge with Lantus and Novolog.  I have spoken with Medication Management and they will be able to provide both Lantus and Novolog at discharge.  Patient states that he has DM supplies at home.  DM educator has met with patient and provided resources for supplies.  Patient is aware that he needs to complete applications for Medication Management and [place] and follow up in order to continue receiving assistance.  RNCM signing off

## 2016-07-02 NOTE — Discharge Summary (Signed)
Zachary Vazquez, is a 30 y.o. male  DOB 08/14/1986  MRN [id number].  Admission date:  06/30/2016  Admitting Physician  Shane CrutchPradeep Ramachandran, MD  Discharge Date:  07/02/2016   Primary MD  No PCP Per Patient  Recommendations for primary care physician for things to follow:  [facility]. His manager has given [facility].   Admission Diagnosis  Diabetic ketoacidosis without coma associated with type 1 diabetes mellitus (HCC) [E10.10]   Discharge Diagnosis  Diabetic ketoacidosis without coma associated with type 1 diabetes mellitus (HCC) [E10.10]    Active Problems:   DKA, type 1 (HCC)      Past Medical History:  Diagnosis Date  . Type 1 diabetes mellitus (HCC)     Past Surgical History:  Procedure Laterality Date  . TONSILLECTOMY         History of present illness and  Hospital Course:     Kindly see H&P for history of present illness and admission details, please review complete Labs, Consult reports and Test reports for all details in brief  HPI  from the history and physical done on the day of admission  30 year old the male patient with history of type 1 diabetes mellitus came in because of nausea, vomiting, DKA. Patient out of insulin, and he came here admitted for DKA so he is admitted to ICU. Anion gap 26 on admission, pH 7.16, blood glucose 545.  Hospital Course  #1 diabetes mellitus type 1 with DKA: Patient received insulin drip., IV fluids, admitted to intensive intensive care unit, followed the blood sugars every 1 hour as per protocol. Ration done in gap closed with help of IV insulin, IV hydration. Patient is moved out of ICU. Seen by diabetic coordinator. Patient acute renal failure, metabolic acidosis all improved with IV insulin, IV hydration.  Patient hemoglobin A1c 10.9. Patient has no primary endocrinologist, he is financially challenged. Patient has been having issues with getting the insulin . Patient is given prescription for Lantus, SSI with coverage, mealtime insulin coverage. Patient will get the Lantus, insulin from medication management clinic. And the according to the case manager Stephanie  medication management has enough supply of Lantus. If the patient does not qualify for medication assistance going forward, he will need to be switching to generic 70/30 and regular insulin. He understands this.  We also gave  H[facility]. #2 hypokalemia: Very mild expected to improve with diet alone. #3 acute renal failure' improved.   Discharge Condition: stable   Follow UP  Follow-up Information    case manager to provide with list Follow up in 5 day(s).        [facility]. Schedule an appointment as soon as possible for a visit in 8 day(s).             Discharge Instructions  and  Discharge Medications       Medication List    STOP taking these medications   insulin regular 100 units/mL injection Commonly known as:  NOVOLIN R,HUMULIN R     TAKE these medications   insulin aspart 100 UNIT/ML injection Commonly known as:  novoLOG Inject 0-9 Units into the skin 4 (four) times daily -  before meals and at bedtime.   insulin aspart 100 UNIT/ML injection Commonly known as:  novoLOG Inject 2 Units into the skin 3 (three) times daily with meals.   insulin glargine 100 UNIT/ML injection Commonly known as:  LANTUS Inject 0.3 mLs (30 Units total) into the skin at bedtime. What changed:  how much to take         Diet and Activity recommendation: See Discharge Instructions above   Consults obtained - none   Major procedures and Radiology Reports - PLEASE review detailed and final reports for all details, in brief -     No results found.  Micro Results    Recent Results (from  the past 240 hour(s))  MRSA PCR Screening     Status: None   Collection Time: 07/01/16  6:30 AM  Result Value Ref Range Status   MRSA by PCR NEGATIVE NEGATIVE Final    Comment:        The GeneXpert MRSA Assay (FDA approved for NASAL specimens only), is one component of a comprehensive MRSA colonization surveillance program. It is not intended to diagnose MRSA infection nor to guide or monitor treatment for MRSA infections.        Today   Subjective:   [name] today has no headache,no chest abdominal pain,no new weakness tingling or numbness, feels much better wants to go home today.   Objective:   Blood pressure 119/75, pulse 70, temperature 98.4 F (36.9 C), temperature source Oral, resp. rate 16, height 6' (1.829 m), weight 69.6 kg (153 lb 7 oz), SpO2 99 %.   Intake/Output Summary (Last 24 hours) at 07/02/16 1250 Last data filed at 07/02/16 0900  Gross per 24 hour  Intake          2640.08 ml  Output                0 ml  Net          2640.08 ml    Exam Awake Alert, Oriented x 3, No new F.N deficits, Normal affect [place].AT,PERRAL Supple Neck,No JVD, No cervical lymphadenopathy appriciated.  Symmetrical Chest wall movement, Good air movement bilaterally, CTAB RRR,No Gallops,Rubs or new Murmurs, No Parasternal Heave +ve B.Sounds, Abd Soft, Non tender, No organomegaly appriciated, No rebound -guarding or rigidity. No Cyanosis, Clubbing or edema, No new Rash or bruise  Data Review   CBC w Diff: Lab Results  Component Value Date   WBC 14.6 (H) 06/30/2016   HGB 15.4 06/30/2016   HCT 46.6 06/30/2016   PLT 301 06/30/2016   LYMPHOPCT 16 06/30/2016   MONOPCT 4 06/30/2016   EOSPCT 1 06/30/2016   BASOPCT 0 06/30/2016    CMP: Lab Results  Component Value Date   NA 136 07/02/2016   K 3.3 (L) 07/02/2016   CL 107 07/02/2016   CO2 24 07/02/2016   BUN 13 07/02/2016   CREATININE 0.76 07/02/2016   PROT 7.6 07/01/2016   ALBUMIN 4.4 07/01/2016   BILITOT 1.6 (H)  07/01/2016   ALKPHOS 79 07/01/2016   AST 93 (H) 07/01/2016   ALT 68 (H) 07/01/2016  .   Total Time in preparing paper work, data evaluation and todays exam - 35 minutes  Lejend Dalby M.D on 07/02/2016 at 12:50 PM    Note: This dictation was prepared with Dragon dictation along with smaller phrase technology. Any transcriptional errors that result from this process are unintentional.

## 2016-07-02 NOTE — Progress Notes (Signed)
Patient discharged to home as ordered. Patient given hard copy of the prescriptions and instructed to take hard copy to medication management clinic to obtain medication. Patient also instructed to [facility]. Patient is alert and oriented, no acute distress noted. IV discontinued site clean dry and intact. Telemetry discontinued as ordered.

## 2016-07-02 NOTE — Care Management (Signed)
[facility] from ICU to her unit. I'm told that these coupons can be used with private insurance; patient apparently is paying for [employer] insurance. I have not heard [facility] from [facility] as they were closed yesterday and have not opened yet today.

## 2016-07-02 NOTE — Progress Notes (Signed)
Spoke with patient about diabetes and home regimen for diabetes control. Patient reports that he was diagnosed with DM1 about 4 years ago and he initially had [place]. However, he has not had any type of insurance for over 2 years. Patient currently does not have a PCP and has [place] about 2[place]3 weeks and is planning to stay here. Patient has been on Lantus and Novolog in the past but most recently he was taking 50/50 insulin that he got from a friend because he was not able to afford to go to the doctor or to get insulin. Patient reports that he was taking 50/50 50 units at bedtime. Discussed insulin in detail (basal, bolus, correction) and how they are generally taken for glycemic control.  Patient reports that he has not been checking his glucose but he states that he has at least 2 glucometers and he has test strips along with other testing supplies at home.  Inquired about prior A1C and patient reports that his last A1C that was checked by a doctor was in the 8 or 9% range. Discussed A1C results (10.9% on 07/01/16) and explained that his current A1C indicates an average glucose of 266 mg/dl over the past 2-3 months. Discussed glucose and A1C goals. Discussed basic pathophysiology of DM Type 1, basic home care, importance of checking CBGs and maintaining good CBG control to prevent long-term and short-term complications. Stressed to the patient that he was very young and if he did not get his diabetes under control he will be at very high risk of developing complications in years to come. Discussed importance of checking CBGs and maintaining good CBG control to prevent long-term and short-term complications. Explained how hyperglycemia leads to damage within blood vessels which lead to the common complications seen with uncontrolled diabetes. Discussed impact of nutrition, exercise, stress, sickness, and medications on diabetes control. Discussed Novolin 70/30, R, NPH which can be  purchased at [facility] for $25 per vial without a prescription. Provided patient with handout information on [employer] products.  Patient was very appreciative of information discussed and states that he truly wants to take care of his diabetes but the lack of insurance is a barrier to being able to afford medication and seeing doctors. Provided emotional support and discussed the [facility] and encouraged patient to fill out the application he has at bedside for both to see if he could qualify for assistance with medication cost and for follow up at [facility].  Patient verbalized understanding of information discussed and he states that he has no further questions at this time related to diabetes.   Discuss patient outpatient plan with Judeth CornfieldStephanie, RN, CM and with Dr. Luberta MutterKonidena. Since [facility] has Lantus and Novolog patient will be discharged on Lantus and Novolog at this time. If patient does not qualify for medication assistant going forward, patient will need to be switched to generic 70/30 and Regular. Informed patient of discharge plan.  Thanks, Orlando PennerMarie Jermesha Sottile, RN, MSN, CDE Diabetes Coordinator Inpatient Diabetes Program [phone] (Team Pager from 8am to 5pm) [phone] ([facility] office) [phone] [facility] office) [phone] [facility] office)

## 2016-07-31 ENCOUNTER — Ambulatory Visit: Payer: Medicaid - Out of State

## 2016-10-07 ENCOUNTER — Inpatient Hospital Stay
Admission: EM | Admit: 2016-10-07 | Discharge: 2016-10-08 | DRG: 639 | Disposition: A | Discharge status: Home / Self Care | Payer: Self-pay | Attending: Internal Medicine | Admitting: Internal Medicine

## 2016-10-07 DIAGNOSIS — F17210 Nicotine dependence, cigarettes, uncomplicated: Secondary | ICD-10-CM | POA: Diagnosis present

## 2016-10-07 DIAGNOSIS — R112 Nausea with vomiting, unspecified: Secondary | ICD-10-CM

## 2016-10-07 DIAGNOSIS — R1011 Right upper quadrant pain: Secondary | ICD-10-CM | POA: Diagnosis present

## 2016-10-07 DIAGNOSIS — E1010 Type 1 diabetes mellitus with ketoacidosis without coma: Principal | ICD-10-CM | POA: Diagnosis present

## 2016-10-07 DIAGNOSIS — R109 Unspecified abdominal pain: Secondary | ICD-10-CM

## 2016-10-07 DIAGNOSIS — N62 Hypertrophy of breast: Secondary | ICD-10-CM | POA: Diagnosis present

## 2016-10-07 DIAGNOSIS — Z794 Long term (current) use of insulin: Secondary | ICD-10-CM

## 2016-10-07 DIAGNOSIS — E1110 Type 2 diabetes mellitus with ketoacidosis without coma: Secondary | ICD-10-CM | POA: Diagnosis present

## 2016-10-07 DIAGNOSIS — T383X6A Underdosing of insulin and oral hypoglycemic [antidiabetic] drugs, initial encounter: Secondary | ICD-10-CM | POA: Diagnosis present

## 2016-10-07 DIAGNOSIS — Y929 Unspecified place or not applicable: Secondary | ICD-10-CM

## 2016-10-07 DIAGNOSIS — R197 Diarrhea, unspecified: Secondary | ICD-10-CM

## 2016-10-07 DIAGNOSIS — Z91138 Patient's unintentional underdosing of medication regimen for other reason: Secondary | ICD-10-CM

## 2016-10-07 LAB — COMPREHENSIVE METABOLIC PANEL
ALK PHOS: 73 U/L (ref 38–126)
ALT: 34 U/L (ref 17–63)
ANION GAP: 23 — AB (ref 5–15)
AST: 35 U/L (ref 15–41)
Albumin: 5.4 g/dL — ABNORMAL HIGH (ref 3.5–5.0)
BUN: 20 mg/dL (ref 6–20)
CALCIUM: 9.8 mg/dL (ref 8.9–10.3)
CO2: 14 mmol/L — AB (ref 22–32)
Chloride: 97 mmol/L — ABNORMAL LOW (ref 101–111)
Creatinine, Ser: 1.24 mg/dL (ref 0.61–1.24)
GFR calc non Af Amer: >60 mL/min (ref >60)
Glucose, Bld: 303 mg/dL — ABNORMAL HIGH (ref 65–99)
Potassium: 3.8 mmol/L (ref 3.5–5.1)
SODIUM: 134 mmol/L — AB (ref 135–145)
TOTAL PROTEIN: 8.9 g/dL — AB (ref 6.5–8.1)
Total Bilirubin: 2.4 mg/dL — ABNORMAL HIGH (ref 0.3–1.2)

## 2016-10-07 LAB — CBC WITH DIFFERENTIAL/PLATELET
Basophils Absolute: 0 10*3/uL (ref 0–0.1)
Basophils Relative: 0 %
EOS ABS: 0 10*3/uL (ref 0–0.7)
EOS PCT: 0 %
HCT: 53.9 % — ABNORMAL HIGH (ref 40.0–52.0)
HEMOGLOBIN: 18 g/dL (ref 13.0–18.0)
LYMPHS ABS: 1.7 10*3/uL (ref 1.0–3.6)
Lymphocytes Relative: 15 %
MCH: 31.2 pg (ref 26.0–34.0)
MCHC: 33.4 g/dL (ref 32.0–36.0)
MCV: 93.4 fL (ref 80.0–100.0)
MONO ABS: 0.4 10*3/uL (ref 0.2–1.0)
MONOS PCT: 4 %
Neutro Abs: 9.2 10*3/uL — ABNORMAL HIGH (ref 1.4–6.5)
Neutrophils Relative %: 81 %
PLATELETS: 304 10*3/uL (ref 150–440)
RBC: 5.77 MIL/uL (ref 4.40–5.90)
RDW: 12.4 % (ref 11.5–14.5)
WBC: 11.4 10*3/uL — ABNORMAL HIGH (ref 3.8–10.6)

## 2016-10-07 LAB — BASIC METABOLIC PANEL
ANION GAP: 11 (ref 5–15)
BUN: 19 mg/dL (ref 6–20)
CHLORIDE: 103 mmol/L (ref 101–111)
CO2: 21 mmol/L — AB (ref 22–32)
CREATININE: 1.04 mg/dL (ref 0.61–1.24)
Calcium: 8.8 mg/dL — ABNORMAL LOW (ref 8.9–10.3)
GFR calc non Af Amer: >60 mL/min (ref >60)
Glucose, Bld: 121 mg/dL — ABNORMAL HIGH (ref 65–99)
POTASSIUM: 3.7 mmol/L (ref 3.5–5.1)
SODIUM: 135 mmol/L (ref 135–145)

## 2016-10-07 LAB — GLUCOSE, CAPILLARY
Glucose-Capillary: 286 mg/dL — ABNORMAL HIGH (ref 65–99)
Glucose-Capillary: 146 mg/dL — ABNORMAL HIGH (ref 65–99)

## 2016-10-07 MED ORDER — SODIUM CHLORIDE 0.9 % IV BOLUS (SEPSIS)
1000.0000 mL | Freq: Once | INTRAVENOUS | Status: AC
  Administered 2016-10-07: 1000 mL via INTRAVENOUS

## 2016-10-07 MED ORDER — ONDANSETRON HCL 4 MG/2ML IJ SOLN
4.0000 mg | Freq: Once | INTRAMUSCULAR | Status: AC
  Administered 2016-10-07: 4 mg via INTRAVENOUS

## 2016-10-07 MED ORDER — SODIUM CHLORIDE 0.9 % IV SOLN
INTRAVENOUS | Status: DC
  Filled 2016-10-07: qty 2.5

## 2016-10-07 MED ORDER — ONDANSETRON HCL 4 MG/2ML IJ SOLN
INTRAMUSCULAR | Status: AC
  Filled 2016-10-07: qty 2

## 2016-10-07 MED ORDER — ONDANSETRON HCL 4 MG/2ML IJ SOLN
4.0000 mg | Freq: Once | INTRAMUSCULAR | Status: AC
  Administered 2016-10-07: 4 mg via INTRAVENOUS
  Filled 2016-10-07: qty 2

## 2016-10-07 NOTE — ED Notes (Signed)
Pt ambulatory to triage without difficulty or distress noted; pt st concerned he may be in DKA; charge nurse notified; will check FSBS and obtain IV access for IVF administration

## 2016-10-07 NOTE — ED Triage Notes (Signed)
Pt presents to ED via POV in WC with c/o DKA-like s/x's. Pt reports being a Type 1 diabetic and having N/V/D x3 days. Pt reports last time checking his CBG was sometime around lunchtime yesterday. Pt is actively vomiting in Triage, denies any change in urinary frequency. Pt reports last BM was 1 week ago prior to diarrhea today. Pt denies any c/o CP or SHOB. Pt reports last dose of Insulin was 2 hrs ago (pt reports 5U Humalog and 60U of 50/50). Pt CGB in Triage is 286 mg/dL.

## 2016-10-07 NOTE — ED Notes (Addendum)
Per Dr. Emmit PomfretHugelmeyer hold Insulin Drip until second basic metabolic panel results.

## 2016-10-07 NOTE — ED Notes (Addendum)
Pt c/o hyperglycemia, pt states he had n/v since yesterday afternoon.  Pt states he has been sleepy and fatigue, checking glucose at home has been high. Pt A&Ox4. Pt in NAD at this time. VS stable

## 2016-10-07 NOTE — ED Provider Notes (Signed)
[facility] Emergency Department Provider Note  ____________________________________________  Time seen: Approximately 9:56 PM  I have reviewed the triage vital signs and the nursing notes.   HISTORY  Chief Complaint Diabetic Ketoacidosis    HPI Gwenyth OberBrandon Gitlin is a 30 y.o. male of a history of type 1 diabetes presenting with nausea vomiting and diarrhea, DKA. The patient reports that he got home from work at 5:30 PM yesterday and immediately had multiple episodes of nausea and vomiting with diffuse abdominal cramping. He slept for more than 20 hours, and then "I knew I was in DKA so I took a bowl load of insulin to try to not have to come to the ER." However, his symptoms persisted. He denies any fever, chills, urinary symptoms.   Past Medical History:  Diagnosis Date  . Type 1 diabetes mellitus [facility](HCC)     Patient Active Problem List   Diagnosis Date Noted  . DKA, type 1 (HCC) 07/01/2016  . DKA (diabetic ketoacidoses) (HCC) 10/04/2014    Past Surgical History:  Procedure Laterality Date  . TONSILLECTOMY      Current Outpatient Rx  . Order #: [id number] Class: Print  . Order #: [id number] Class: Print  . Order #: [id number] Class: Print    Allergies Clindamycin/lincomycin  No family history on file.  Social History Social History  Substance Use Topics  . Smoking status: Current Every Day Smoker    Packs/day: 1.00    Years: 15.00    Types: Cigarettes  . Smokeless tobacco: Never Used  . Alcohol use No    Review of Systems Constitutional: No fever/chills.No lightheadedness or syncope Eyes: No visual changes. ENT: No sore throat. No congestion or rhinorrhea. Cardiovascular: Denies chest pain. Denies palpitations. Respiratory: Denies shortness of breath.  No cough. Gastrointestinal: Positive mild diffuse abdominal cramping.  Positive nausea, positive vomiting.  Positive diarrhea.  No constipation. Genitourinary: Negative for  dysuria. Musculoskeletal: Negative for back pain. Skin: Negative for rash. Neurological: Negative for headaches. No focal numbness, tingling or weakness.  Endocrine:Positive DKA  10-point ROS otherwise negative.  ____________________________________________   PHYSICAL EXAM:  VITAL SIGNS: ED Triage Vitals  Enc Vitals Group     BP 10/07/16 2033 (!) 115/101     Pulse Rate 10/07/16 2033 (!) 135     Resp 10/07/16 2033 20     Temp 10/07/16 2033 98.6 F (37 C)     Temp Source 10/07/16 2033 Oral     SpO2 10/07/16 2033 97 %     Weight 10/07/16 2033 150 lb (68 kg)     Height 10/07/16 2033 6' (1.829 m)     Head Circumference --      Peak Flow --      Pain Score 10/07/16 2034 0     Pain Loc --      Pain Edu? --      Excl. in GC? --     Constitutional: Alert and oriented. Well appearing and in no acute distress. Answers questions appropriately. Eyes: Conjunctivae are normal.  EOMI. No scleral icterus. Head: Atraumatic. Nose: No congestion/rhinnorhea. Mouth/Throat: Mucous membranes are Dry.  Neck: No stridor.  Supple.   Cardiovascular: Normal rate, regular rhythm. No murmurs, rubs or gallops.  Respiratory: Normal respiratory effort.  No accessory muscle use or retractions. Lungs CTAB.  No wheezes, rales or ronchi. Gastrointestinal: Soft, and nondistended.  Minimal tenderness in the right upper quadrant without Murphy sign. No guarding or rebound.  No peritoneal signs. Musculoskeletal: No LE edema.  Neurologic:  A&Ox3.  Speech is clear.  Face and smile are symmetric.  EOMI.  Moves all extremities well. Skin:  Skin is warm, dry and intact. No rash noted. Psychiatric: Mood and affect are normal. Speech and behavior are normal.  Normal judgement.  ____________________________________________   LABS (all labs ordered are listed, but only abnormal results are displayed)  Labs Reviewed  GLUCOSE, CAPILLARY - Abnormal; Notable for the following:       Result Value   Glucose-Capillary  286 (*)    All other components within normal limits  CBC WITH DIFFERENTIAL/PLATELET - Abnormal; Notable for the following:    WBC 11.4 (*)    HCT 53.9 (*)    Neutro Abs 9.2 (*)    All other components within normal limits  COMPREHENSIVE METABOLIC PANEL - Abnormal; Notable for the following:    Sodium 134 (*)    Chloride 97 (*)    CO2 14 (*)    Glucose, Bld 303 (*)    Total Protein 8.9 (*)    Albumin 5.4 (*)    Total Bilirubin 2.4 (*)    Anion gap 23 (*)    All other components within normal limits  URINALYSIS, COMPLETE (UACMP) WITH MICROSCOPIC  BLOOD GAS, VENOUS   ____________________________________________  EKG  Not indicated ____________________________________________  RADIOLOGY  No results found.  ____________________________________________   PROCEDURES  Procedure(s) performed: None  Procedures  Critical Care performed: No ____________________________________________   INITIAL IMPRESSION / ASSESSMENT AND PLAN / ED COURSE  Pertinent labs & imaging results that were available during my care of the patient were reviewed by me and considered in my medical decision making (see chart for details).  30 y.o. L with a history of type 1 diabetes and recurrent DKA resenting with nausea vomiting and diarrhea, and DKA. Overall, the patient has stable vital signs and a reassuring examination. He has some minimal tenderness in the right upper quadrant, but it is less likely that he is having an acute gallbladder pathology. I have let the hospitalist no, so that he can receive serial abdominal examinations. In the meantime, we will initiate an insulin drip, intravenous fluids, and symptomatic treatment. The patient is admitted to the hospital for further evaluation and treatment.  ____________________________________________  FINAL CLINICAL IMPRESSION(S) / ED DIAGNOSES  Final diagnoses:  Diabetic ketoacidosis without coma associated with type 1 diabetes mellitus (HCC)   Nausea vomiting and diarrhea    Clinical Course       NEW MEDICATIONS STARTED DURING THIS VISIT:  New Prescriptions   No medications on file      Rockne MenghiniAnne-Caroline Bich Mchaney, MD 10/07/16 2202

## 2016-10-07 NOTE — ED Notes (Signed)
Family at bedside. 

## 2016-10-08 LAB — BLOOD GAS, VENOUS
ACID-BASE DEFICIT: 6.5 mmol/L — AB (ref 0.0–2.0)
BICARBONATE: 19.7 mmol/L — AB (ref 20.0–28.0)
PCO2 VEN: 41 mmHg — AB (ref 44.0–60.0)
PH VEN: 7.29 (ref 7.250–7.430)
Patient temperature: 37

## 2016-10-08 LAB — GLUCOSE, CAPILLARY
GLUCOSE-CAPILLARY: 63 mg/dL — AB (ref 65–99)
GLUCOSE-CAPILLARY: 77 mg/dL (ref 65–99)
Glucose-Capillary: 129 mg/dL — ABNORMAL HIGH (ref 65–99)
Glucose-Capillary: 198 mg/dL — ABNORMAL HIGH (ref 65–99)
Glucose-Capillary: 198 mg/dL — ABNORMAL HIGH (ref 65–99)
Glucose-Capillary: 255 mg/dL — ABNORMAL HIGH (ref 65–99)

## 2016-10-08 LAB — CBC
HEMATOCRIT: 42 % (ref 40.0–52.0)
HEMOGLOBIN: 14.8 g/dL (ref 13.0–18.0)
MCH: 32.2 pg (ref 26.0–34.0)
MCHC: 35.3 g/dL (ref 32.0–36.0)
MCV: 91.3 fL (ref 80.0–100.0)
Platelets: 241 10*3/uL (ref 150–440)
RBC: 4.6 MIL/uL (ref 4.40–5.90)
RDW: 12.2 % (ref 11.5–14.5)
WBC: 10.7 10*3/uL — ABNORMAL HIGH (ref 3.8–10.6)

## 2016-10-08 LAB — PHOSPHORUS: PHOSPHORUS: 3.2 mg/dL (ref 2.5–4.6)

## 2016-10-08 LAB — URINALYSIS, COMPLETE (UACMP) WITH MICROSCOPIC
BACTERIA UA: NONE SEEN
Bilirubin Urine: NEGATIVE
Hgb urine dipstick: NEGATIVE
Ketones, ur: 80 mg/dL — AB
LEUKOCYTES UA: NEGATIVE
NITRITE: NEGATIVE
PH: 6 (ref 5.0–8.0)
PROTEIN: NEGATIVE mg/dL
SPECIFIC GRAVITY, URINE: 1.03 (ref 1.005–1.030)
Squamous Epithelial / LPF: NONE SEEN

## 2016-10-08 LAB — COMPREHENSIVE METABOLIC PANEL
ALBUMIN: 3.7 g/dL (ref 3.5–5.0)
ALT: 20 U/L (ref 17–63)
AST: 16 U/L (ref 15–41)
Alkaline Phosphatase: 47 U/L (ref 38–126)
Anion gap: 9 (ref 5–15)
BUN: 18 mg/dL (ref 6–20)
CHLORIDE: 102 mmol/L (ref 101–111)
CO2: 22 mmol/L (ref 22–32)
CREATININE: 0.82 mg/dL (ref 0.61–1.24)
Calcium: 8.4 mg/dL — ABNORMAL LOW (ref 8.9–10.3)
GFR calc Af Amer: >60 mL/min (ref >60)
GLUCOSE: 218 mg/dL — AB (ref 65–99)
POTASSIUM: 3.8 mmol/L (ref 3.5–5.1)
SODIUM: 133 mmol/L — AB (ref 135–145)
Total Bilirubin: 1.3 mg/dL — ABNORMAL HIGH (ref 0.3–1.2)
Total Protein: 6.4 g/dL — ABNORMAL LOW (ref 6.5–8.1)

## 2016-10-08 LAB — MAGNESIUM: Magnesium: 1.6 mg/dL — ABNORMAL LOW (ref 1.7–2.4)

## 2016-10-08 MED ORDER — INSULIN GLARGINE 100 UNIT/ML ~~LOC~~ SOLN
30.0000 [IU] | Freq: Every day | SUBCUTANEOUS | Status: DC
  Administered 2016-10-08: 30 [IU] via SUBCUTANEOUS
  Filled 2016-10-08 (×2): qty 0.3

## 2016-10-08 MED ORDER — SENNOSIDES-DOCUSATE SODIUM 8.6-50 MG PO TABS: 1.0000 | ORAL_TABLET | Freq: Every evening | ORAL | Status: DC | PRN

## 2016-10-08 MED ORDER — ONDANSETRON HCL 4 MG/2ML IJ SOLN: 4.0000 mg | Freq: Four times a day (QID) | INTRAMUSCULAR | Status: DC | PRN

## 2016-10-08 MED ORDER — INSULIN ASPART 100 UNIT/ML ~~LOC~~ SOLN
4.0000 [IU] | Freq: Three times a day (TID) | SUBCUTANEOUS | Status: DC
  Administered 2016-10-08: 4 [IU] via SUBCUTANEOUS
  Filled 2016-10-08: qty 4

## 2016-10-08 MED ORDER — BISACODYL 5 MG PO TBEC
5.0000 mg | DELAYED_RELEASE_TABLET | Freq: Every day | ORAL | Status: DC | PRN
  Filled 2016-10-08: qty 1

## 2016-10-08 MED ORDER — ONDANSETRON HCL 4 MG PO TABS: 4.0000 mg | ORAL_TABLET | Freq: Four times a day (QID) | ORAL | Status: DC | PRN

## 2016-10-08 MED ORDER — MAGNESIUM CITRATE PO SOLN
1.0000 | Freq: Once | ORAL | Status: DC | PRN
  Filled 2016-10-08: qty 296

## 2016-10-08 MED ORDER — KCL IN DEXTROSE-NACL 20-5-0.9 MEQ/L-%-% IV SOLN
INTRAVENOUS | Status: DC
  Administered 2016-10-08 (×2): via INTRAVENOUS
  Filled 2016-10-08 (×6): qty 1000

## 2016-10-08 MED ORDER — ACETAMINOPHEN 650 MG RE SUPP: 650.0000 mg | Freq: Four times a day (QID) | RECTAL | Status: DC | PRN

## 2016-10-08 MED ORDER — SODIUM CHLORIDE 0.9% FLUSH
3.0000 mL | Freq: Two times a day (BID) | INTRAVENOUS | Status: DC
  Administered 2016-10-08: 3 mL via INTRAVENOUS

## 2016-10-08 MED ORDER — INSULIN NPH ISOPHANE & REGULAR (70-30) 100 UNIT/ML ~~LOC~~ SUSP: 20.0000 [IU] | Freq: Two times a day (BID) | SUBCUTANEOUS | 2 refills | Status: DC

## 2016-10-08 MED ORDER — ACETAMINOPHEN 325 MG PO TABS: 650.0000 mg | ORAL_TABLET | Freq: Four times a day (QID) | ORAL | Status: DC | PRN

## 2016-10-08 MED ORDER — ZOLPIDEM TARTRATE 5 MG PO TABS: 5.0000 mg | ORAL_TABLET | Freq: Every evening | ORAL | Status: DC | PRN

## 2016-10-08 MED ORDER — OXYCODONE HCL 5 MG PO TABS: 5.0000 mg | ORAL_TABLET | ORAL | Status: DC | PRN

## 2016-10-08 MED ORDER — ALBUTEROL SULFATE (2.5 MG/3ML) 0.083% IN NEBU: 2.5000 mg | INHALATION_SOLUTION | Freq: Four times a day (QID) | RESPIRATORY_TRACT | Status: DC | PRN

## 2016-10-08 MED ORDER — INSULIN ASPART 100 UNIT/ML ~~LOC~~ SOLN
0.0000 [IU] | Freq: Three times a day (TID) | SUBCUTANEOUS | Status: DC
  Administered 2016-10-08: 5 [IU] via SUBCUTANEOUS
  Administered 2016-10-08: 2 [IU] via SUBCUTANEOUS
  Filled 2016-10-08: qty 2
  Filled 2016-10-08: qty 5

## 2016-10-08 MED ORDER — INSULIN ASPART 100 UNIT/ML ~~LOC~~ SOLN: 0.0000 [IU] | Freq: Every day | SUBCUTANEOUS | Status: DC

## 2016-10-08 NOTE — Progress Notes (Signed)
Inpatient Diabetes Program Recommendations  AACE/ADA: New Consensus Statement on Inpatient Glycemic Control (2015)  Target Ranges:  Prepandial:   less than 140 mg/dL      Peak postprandial:   less than 180 mg/dL (1-2 hours)      Critically ill patients:  140 - 180 mg/dL   Results for Zachary Vazquez, Choice (MRN [id number]) as of 10/08/2016 10:08  Ref. Range 10/07/2016 20:26 10/07/2016 22:26 10/08/2016 00:34 10/08/2016 02:03 10/08/2016 02:40 10/08/2016 03:18 10/08/2016 08:02  Glucose-Capillary Latest Ref Range: 65 - 99 mg/dL 409286 (H) 811146 (H) 77 63 (L) 129 (H) 198 (H) 198 (H)   Review of Glycemic Control  Diabetes history: DM1 Outpatient Diabetes medications: 50/50 60 units at bedtime Current orders for Inpatient glycemic control: Lantus 30 units QHS, Novolog 0-9 units TID with meals, Novolog 0-5 units QHS  Inpatient Diabetes Program Recommendations: Insulin - Basal: Please consider discontinuing Lantus and ordering 70/30 20 units BID (will provide a total of 28 units for basal and 12 units for meal coverage per day) starting with breakfast on 10/09/16 since patient has already received Lantus 30 units this morning.  Outpatient Referral: Encouraged patient to [facility]. Insulin-Meal Coverage: Please consider ordering Novolog 4 units TID with meals for meal coverage if patient eats at least 50% of meal. However, please discontinue meal coverage after supper today if 70/30 ordered as recommended and started tomorrow morning.  Spoke with patient about diabetes and home regimen for diabetes control. Diabetes Coordinator talked with patient on 07/02/16 during last hospital admission for DKA (see note for details). Patient reports that he was diagnosed with DM1 about 4 years ago and he initially had [place]. However, he has not had any type of insurance for over 2 years. Patient currently does not have a PCP and no insurance.   Patient states that he is not able to get insurance with his current job.  Patient has been on Lantus and Novolog in the past but most recently he was taking 50/50 insulin that he got from a friend because he was not able to afford to go to the doctor or to get insulin. Patient reports that he was taking 50/50 60 units at bedtime. Discussed insulin in detail (basal, bolus, correction) and how they are generally taken for glycemic control especially for a person with Type 1 diabetes. Discussed 50/50 insulin and explained that taking it once a day is not sufficient for insulin needs since he makes no insulin at all and it does not last 24 hours.  Discussed glucose and A1C goals. Discussed basic pathophysiology of DM Type 1, basic home care, importance of checking CBGs and maintaining good CBG control to prevent long-term and short-term complications. Stressed to the patient that he was very young and if he did not get his diabetes under control he will be at very high risk of developing complications in years to come. Discussed importance of checking CBGs and maintaining good CBG control to prevent long-term and short-term complications. Explained how hyperglycemia leads to damage within blood vessels which lead to the common complications seen with uncontrolled diabetes. Discussed impact of nutrition, exercise, stress, sickness, and medications on diabetes control. Discussed NOVOLIN 70/30, NOVOLIN R, NOVOLIN NPH which can be purchased at [facility] for $25 per vial without a prescription.  Patient stated that the 50/50 insulin he had would expire in February and he plans to get rid of it once it expires and  is planning to start using NOVOLIN 70/30 insulin. Patient was very appreciative of information discussed and states that he truly wants to take care of his diabetes but the lack of insurance is a barrier to being able to afford medication and seeing doctors.  Inquired if [facility] after discharge from the hospital in September and he states that he was told he made too much money for any assistance. Encouraged patient to check with both the [facility] and [facility] again to see if he can get any assistance with medications and can establish care. Patient [place] so also [facility]. Strongly encourage patient to establish care with MD to assist with improving glycemic control. Also encouraged patient to consider using NOVOLIN 70/30 BID since [facility] for $25 per vial.   Patient verbalized understanding of information discussed and he states that he has no further questions at this time related to diabetes.   Thanks, Orlando PennerMarie Nancie Bocanegra, RN, MSN, CDE Diabetes Coordinator Inpatient Diabetes Program [phone] (Team Pager from 8am to 5pm)

## 2016-10-08 NOTE — Care Management Note (Signed)
Case Management Note  Patient Details  Name: Zachary Vazquez MRN: [id number] Date of Birth: Apr 15, 1986  Subjective/Objective: Met with patient at bedside. He is laying in bed and talkative. I reminded him last visit that he was referred to [facility] and [facility]. He reports he did not follow up with them. New application give and email sent to both agencies with patient referral. He does reports he is working and making $15 dollars per hour. Diabetes Coordinator has given him other resources if the [facility] and [facility] fall though. Encouarged patient to request lowest cost insulin in the event he has to pay out of pocket. He has a glucometer. Has been using a friends insulin.                  Action/Plan Please order Novolin 70/30 per Diabetes Coordinator recommendations due to low cost.     Expected Discharge Date:                  Expected Discharge Plan:  Home/Self Care  In-House Referral:     Discharge planning Services  CM Consult, [facility], Medication Assistance  Post Acute Care Choice:    Choice offered to:     DME Arranged:    DME Agency:     HH Arranged:    HH Agency:     Status of Service:  Completed, signed off  If discussed at [employer], dates discussed:    Additional Comments:  Jolly Mango, RN 10/08/2016, 11:51 AM

## 2016-10-08 NOTE — Progress Notes (Signed)
Discharge instructions reviewed with patient. MD called to clarify insulin regimen as patient transitioning to 70/30. Dr. Renae GlossWieting returned call and questions answered regarding administration of medication. Patient verbalized understanding and encouraged to still keep blood sugar log to take with him to next appointment. Patient states that he will continue to look for a PCP. Patient reminded to watch for signs of hypoglycemia, hyperglycemia, diabetic neuropathy and changes in vision. Vital signs stable. IVx2 dc'ed, catheter intact. Telemetry discontinued- CCMD tech aware. Patient requesting to walk out- Patient states that he is calling ride to come pick him up who will be here momentarily. Rx given to patient as well as work note. Patient left the unit ambulatory with this nurse.

## 2016-10-08 NOTE — Progress Notes (Signed)
Patient ID: Zachary Vazquez, male   DOB: 05/31/1986, 30 y.o.   MRN: [id number]  [facility] [place] at [facility]        Zachary OberBrandon Belasco was admitted to the Hospital on 10/07/2016 and Discharged  10/08/2016 and should be excused from work/school   for 4  days starting 10/07/2016 , may return to work/school without any restrictions.  Alford HighlandWIETING, Sherhonda Gaspar M.D on 10/08/2016,at 11:31 AM  [facility] - [place] at [facility]    Office  [phone]

## 2016-10-08 NOTE — ED Notes (Signed)
Pt provided with graham crackers, peanut butter, regular sprite, and whole milk to assist with sugar. CBG 63 at this time.

## 2016-10-08 NOTE — Discharge Summary (Addendum)
[facility] - [place] at [facility]   PATIENT NAME: Zachary Vazquez    MR#:  [id number]  DATE OF BIRTH:  05/19/1986  DATE OF ADMISSION:  10/07/2016 ADMITTING PHYSICIAN: Tonye RoyaltyAlexis Hugelmeyer, DO  DATE OF DISCHARGE: 10/08/2016  PRIMARY CARE PHYSICIAN: [facility]   ADMISSION DIAGNOSIS:  Nausea vomiting and diarrhea [R11.2, R19.7] Abdominal pain [R10.9] Diabetic ketoacidosis without coma associated with type 1 diabetes mellitus (HCC) [E10.10]  DISCHARGE DIAGNOSIS:  Active Problems:   DKA (diabetic ketoacidoses) (HCC)   SECONDARY DIAGNOSIS:   Past Medical History:  Diagnosis Date  . Type 1 diabetes mellitus (HCC)     HOSPITAL COURSE:   1. Diabetic ketoacidosis. Patient was given IV fluid hydration and started on an insulin drip. Lantus insulin was prescribed with short acting insulin.  Hemoglobin A1c was ordered but still pending. Seen by diabetic coordinator and they recommended Novolin 70/30 insulin 20 units twice a day which I will start tomorrow. Patient states that he has glucometer and all his diabetic supplies at home. Note for work prescribed. 2. Abdominal pain, nausea vomiting diarrhea likely all secondary to diabetic ketoacidosis this has resolved and he tolerated diet. He refused ultrasound of the abdomen today. 3. Right-sided gynecomastia felt like a cystic type lesion right breast. Patient states that it's been there his entire life. Follow-up this as outpatient as needed.  DISCHARGE CONDITIONS:   Satisfactory  CONSULTS OBTAINED:   none  DRUG ALLERGIES:   Allergies  Allergen Reactions  . Clindamycin/Lincomycin Anaphylaxis    DISCHARGE MEDICATIONS:   Current Discharge Medication List    START taking these medications   Details  insulin NPH-regular Human (NOVOLIN 70/30) (70-30) 100 UNIT/ML injection Inject 20 Units into the skin 2 (two) times daily with a meal. Qty: 10 mL, Refills: 2      CONTINUE these medications which have NOT  CHANGED   Details  insulin aspart (NOVOLOG) 100 UNIT/ML injection Inject 0-9 Units into the skin 4 (four) times daily -  before meals and at bedtime. Qty: 10 mL, Refills: 11    insulin glargine (LANTUS) 100 UNIT/ML injection Inject 0.3 mLs (30 Units total) into the skin at bedtime. Qty: 10 mL, Refills: 11         DISCHARGE INSTRUCTIONS:   Follow-up open or clinic 2 weeks  If you experience worsening of your admission symptoms, develop shortness of breath, life threatening emergency, suicidal or homicidal thoughts you must seek medical attention immediately by calling 911 or calling your MD immediately  if symptoms less severe.  You Must read complete instructions/literature along with all the possible adverse reactions/side effects for all the Medicines you take and that have been prescribed to you. Take any new Medicines after you have completely understood and accept all the possible adverse reactions/side effects.   Please note  You were cared for by a hospitalist during your hospital stay. If you have any questions about your discharge medications or the care you received while you were in the hospital after you are discharged, you can call the unit and asked to speak with the hospitalist on call if the hospitalist that took care of you is not available. Once you are discharged, your primary care physician will handle any further medical issues. Please note that NO REFILLS for any discharge medications will be authorized once you are discharged, as it is imperative that you return to your primary care physician (or establish a relationship with a primary care physician if you do not have  one) for your aftercare needs so that they can reassess your need for medications and monitor your lab values.    Today   CHIEF COMPLAINT:   Chief Complaint  Patient presents with  . Diabetic Ketoacidosis    HISTORY OF PRESENT ILLNESS:  Zachary Vazquez  is a 30 y.o. male with a known history of  Diabetes presents with diabetic ketoacidosis   VITAL SIGNS:  Blood pressure 129/71, pulse 79, temperature 97.3 F (36.3 C), temperature source Oral, resp. rate 16, height 6' (1.829 m), weight 68 kg (150 lb), SpO2 98 %.  I/O:   Intake/Output Summary (Last 24 hours) at 10/08/16 1401 Last data filed at 10/08/16 1300  Gross per 24 hour  Intake          2331.67 ml  Output              450 ml  Net          1881.67 ml    PHYSICAL EXAMINATION:  GENERAL:  30 y.o.-year-old patient lying in the bed with no acute distress.  EYES: Pupils equal, round, reactive to light and accommodation. No scleral icterus. Extraocular muscles intact.  HEENT: Head atraumatic, normocephalic. Oropharynx and nasopharynx clear.  NECK:  Supple, no jugular venous distention. No thyroid enlargement, no tenderness.  LUNGS: Normal breath sounds bilaterally, no wheezing, rales,rhonchi or crepitation. No use of accessory muscles of respiration.  CARDIOVASCULAR: S1, S2 normal. No murmurs, rubs, or gallops.  ABDOMEN: Soft, non-tender, non-distended. Bowel sounds present. No organomegaly or mass.  EXTREMITIES: No pedal edema, cyanosis, or clubbing.  NEUROLOGIC: Cranial nerves II through XII are intact. Muscle strength 5/5 in all extremities. Sensation intact. Gait not checked.  PSYCHIATRIC: The patient is alert and oriented x 3.  SKIN: No obvious rash, lesion, or ulcer. Right breast cystic lesion mobile without pain to palpation  DATA REVIEW:   CBC  Recent Labs Lab 10/08/16 0445  WBC 10.7*  HGB 14.8  HCT 42.0  PLT 241    Chemistries   Recent Labs Lab 10/08/16 0445  NA 133*  K 3.8  CL 102  CO2 22  GLUCOSE 218*  BUN 18  CREATININE 0.82  CALCIUM 8.4*  MG 1.6*  AST 16  ALT 20  ALKPHOS 47  BILITOT 1.3*     Microbiology Results  Results for orders placed or performed during the hospital encounter of 06/30/16  MRSA PCR Screening     Status: None   Collection Time: 07/01/16  6:30 AM  Result Value Ref  Range Status   MRSA by PCR NEGATIVE NEGATIVE Final    Comment:        The GeneXpert MRSA Assay (FDA approved for NASAL specimens only), is one component of a comprehensive MRSA colonization surveillance program. It is not intended to diagnose MRSA infection nor to guide or monitor treatment for MRSA infections.        Management plans discussed with the patient, And he is in agreement.  CODE STATUS:     Code Status Orders        Start     Ordered   10/08/16 0200  Full code  Continuous     10/08/16 0159    Code Status History    Date Active Date Inactive Code Status Order ID Comments User Context   07/01/2016  2:46 AM 07/02/2016  6:17 PM Full Code [id number]  Lewie Loron, NP ED   10/04/2014  6:41 PM 10/05/2014  3:15 AM Full Code [id number]  Richarda OverlieNayana Abrol, MD ED      TOTAL TIME TAKING CARE OF THIS PATIENT: 35 minutes.    Alford HighlandWIETING, Swati Granberry M.D on 10/08/2016 at 2:01 PM  Between 7am to 6pm - Pager - [phone]  After 6pm go to [email or web address] - password [email or web address]  [facility] Office  [phone]  CC: Primary care physician; [facility]

## 2016-10-08 NOTE — H&P (Addendum)
[facility] [place] @ [facility] Admission History and Physical [employer], D.O.  ---------------------------------------------------------------------------------------------------------------------   PATIENT NAME: Zachary Vazquez MR#: [id number] DATE OF BIRTH: 26-Feb-1986 DATE OF ADMISSION: 10/07/2016 PRIMARY CARE PHYSICIAN: No PCP Per Patient  REQUESTING/REFERRING PHYSICIAN: ED Dr. Sharma Covert  CHIEF COMPLAINT: Chief Complaint  Patient presents with  . Diabetic Ketoacidosis    HISTORY OF PRESENT ILLNESS:  Zachary Vazquez is a 30 y.o. male with a known history of Type 1 diabetes, insulin-dependent since age 70 presents to the emergency department complaining of DKA.  Patient was in a usual state of health until yesterday when he describes generalized weakness, muscle aches, nausea, vomiting, nonbloody nonbilious, diffuse abdominal cramping worse at the right upper quadrant and midepigastrium. He reports that prior to the onset of his symptoms he did not take his insulin because he was drunk and forgot. This is similar to his episodes of DKA in the past. Prior to coming to the emergency department he took "a boatload of insulin" to avoid an emergency department visit however symptoms persisted prompting ED visit.   Patient is mostly compliant with his medications. However he has no insurance, no primary care provider and therefore obtaining his medications has been more difficult recently.  Last episode of DKA was in 2015.  Of note he reports chronic severe constipation with one bowel movement every 6 days and only if he takes Ex-Lax. He denies any baseline abdominal pain, distention. But he does report blood in his stools when he has hard bowel movements. He has not sought any medical attention for his constipation.  Otherwise there has been no change in status. Patient has been taking medication as prescribed and there has been no recent change in medication or diet.  There has been  no recent illness, travel or sick contacts.    Patient denies fevers/chills, chest pain, shortness of breath, N/V/C/D, dysuria/frequency, changes in mental status.   EMS/ED COURSE:   Patient received insulin drip, Zofran, IV fluids.  PAST MEDICAL HISTORY: Past Medical History:  Diagnosis Date  . Type 1 diabetes mellitus (HCC)       PAST SURGICAL HISTORY: Past Surgical History:  Procedure Laterality Date  . TONSILLECTOMY        SOCIAL HISTORY: Social History  Substance Use Topics  . Smoking status: Current Every Day Smoker    Packs/day: 1.00    Years: 15.00    Types: Cigarettes  . Smokeless tobacco: Never Used  . Alcohol use No   Patient reports that he binge drinks socially about once per month.    FAMILY HISTORY: Family History  Problem Relation Age of Onset  . Hypertension Mother   . Diabetes Mellitus I Brother      MEDICATIONS AT HOME: Prior to Admission medications   Medication Sig Start Date End Date Taking? Authorizing Provider  insulin aspart (NOVOLOG) 100 UNIT/ML injection Inject 0-9 Units into the skin 4 (four) times daily -  before meals and at bedtime. 07/02/16   Katha Hamming, MD  insulin aspart (NOVOLOG) 100 UNIT/ML injection Inject 2 Units into the skin 3 (three) times daily with meals. 07/02/16   Katha Hamming, MD  insulin glargine (LANTUS) 100 UNIT/ML injection Inject 0.3 mLs (30 Units total) into the skin at bedtime. 07/02/16   Katha Hamming, MD      DRUG ALLERGIES: Allergies  Allergen Reactions  . Clindamycin/Lincomycin Anaphylaxis     REVIEW OF SYSTEMS: CONSTITUTIONAL: Positive fatigue, weakness, Negative fever, chills, weight gain/loss, headache EYES: No blurry  or double vision. ENT: No tinnitus, postnasal drip, redness or soreness of the oropharynx. RESPIRATORY: No dyspnea, cough, wheeze, hemoptysis. CARDIOVASCULAR: No chest pain, orthopnea, palpitations, syncope. GASTROINTESTINAL: Positive nausea, vomiting,  constipation,abdominal pain, hematochezia with constipation.. No hematemesis, melena. GENITOURINARY: No dysuria, frequency, hematuria. ENDOCRINE: No polyuria or nocturia. No heat or cold intolerance. HEMATOLOGY: No anemia, bruising, bleeding. INTEGUMENTARY: No rashes, ulcers, lesions. MUSCULOSKELETAL: No pain, arthritis, swelling, gout. NEUROLOGIC: No numbness, tingling, weakness or ataxia. No seizure-type activity. PSYCHIATRIC: No anxiety, depression, insomnia.  PHYSICAL EXAMINATION: VITAL SIGNS: Blood pressure 108/71, pulse (!) 106, temperature 98.6 F (37 C), temperature source Oral, resp. rate 18, height 6' (1.829 m), weight 68 kg (150 lb), SpO2 100 %.  GENERAL: 30 y.o.-year-old white male patient, well-developed, well-nourished lying in the bed in no acute distress.  Pleasant and cooperative.   HEENT: Head atraumatic, normocephalic. Pupils equal, round, reactive to light and accommodation. No scleral icterus. Extraocular muscles intact. Oropharynx is clear. Mucus membranes moist. NECK: Supple, full range of motion. No JVD, no bruit heard. No cervical lymphadenopathy. CHEST: Normal breath sounds bilaterally. No wheezing, rales, rhonchi or crackles. No use of accessory muscles of respiration.  No reproducible chest wall tenderness.  CARDIOVASCULAR: S1, S2 normal. No murmurs, rubs, or gallops appreciated. Cap refill <2 seconds. ABDOMEN: Soft,  nondistended, positive right upper quadrant tenderness to palpation. No rebound, guarding, rigidity. Normoactive bowel sounds present in all four quadrants. No organomegaly or mass. EXTREMITIES: Full range of motion. No pedal edema, cyanosis, or clubbing. NEUROLOGIC: Cranial nerves II through XII are grossly intact with no focal sensorimotor deficit. Muscle strength 5/5 in all extremities. Sensation intact. Gait not checked. PSYCHIATRIC: The patient is alert and oriented x 3. Normal affect, mood, thought content. SKIN: Warm, dry, and intact without  obvious rash, lesion, or ulcer.  LABORATORY PANEL:  CBC  Recent Labs Lab 10/07/16 2028  WBC 11.4*  HGB 18.0  HCT 53.9*  PLT 304   ----------------------------------------------------------------------------------------------------------------- Chemistries  Recent Labs Lab 10/07/16 2028 10/07/16 2320  NA 134* 135  K 3.8 3.7  CL 97* 103  CO2 14* 21*  GLUCOSE 303* 121*  BUN 20 19  CREATININE 1.24 1.04  CALCIUM 9.8 8.8*  AST 35  --   ALT 34  --   ALKPHOS 73  --   BILITOT 2.4*  --    ------------------------------------------------------------------------------------------------------------------ Cardiac Enzymes No results for input(s): TROPONINI in the last 168 hours. ------------------------------------------------------------------------------------------------------------------  IMPRESSION AND PLAN:  This is a 30 y.o. male with a history of Type 1 diabetes, constipation now being admitted with:  1. DKA, mild-Admit to MedSurg with telemetry monitoring for aggressive IV fluid hydration and electrolyte monitoring, blood glucose checks q2h. Follow-up BMP done in the emergency department revealed closure the anion gap with a normal glucose. We'll therefore overlap IV insulin with subcutaneous and change his fluids to D5 normal saline with 20 potassium. Repeat BMP in 4 hours.  We'll check hemoglobin A1c. I have also contacted care management regarding insurance issues and obtaining medications. 2. Right upper quadrant abdominal pain with elevated bilirubin-we'll obtain abdominal ultrasound. 3. Chronic constipation with hematochezia (not actively bleeding)-likely secondary to gastroparesis. Bowel regimen for constipation. Monitor for bleeding.  Patient only outpatient GI follow-up to address.   Diet/Nutrition: Carb modified  Fluids: D5NS with KCl DVT Px: SCDs and early ambulation Code Status: Full  All the records are reviewed and case discussed with ED provider.  Management plans discussed with the patient and/or family who express understanding and agree with  plan of care.   TOTAL TIME TAKING CARE OF THIS PATIENT: 60 minutes.   Kayon Dozier D.O. on 10/08/2016 at 12:45 AM Between 7am to 6pm - Pager - [phone] After 6pm go to [email or web address] - [email or web address] [email or web address] [facility] [place] [facility] Office [phone] CC: Primary care physician; No PCP Per Patient     Note: This dictation was prepared with Dragon dictation along with smaller phrase technology. Any transcriptional errors that result from this process are unintentional.

## 2016-10-08 NOTE — Progress Notes (Signed)
Ultrasound called to inquire about patient's NPO status and when the last time patient had eaten. Patient stated that the last time he ate was around 0330 this morning. Patient was advised not to eat, however patient states that US was for his gallbladder and patient is not currently having any issues with his gallbladder and is refusing Ultrasound at this time. US tech made aware of patient's refusal.

## 2016-10-09 LAB — HEMOGLOBIN A1C
Hgb A1c MFr Bld: 11.8 % — ABNORMAL HIGH (ref 4.8–5.6)
MEAN PLASMA GLUCOSE: 292 mg/dL

## 2017-03-30 ENCOUNTER — Encounter (HOSPITAL_COMMUNITY): Payer: Self-pay

## 2017-03-30 ENCOUNTER — Inpatient Hospital Stay (HOSPITAL_COMMUNITY): Payer: Self-pay

## 2017-03-30 ENCOUNTER — Inpatient Hospital Stay (HOSPITAL_COMMUNITY)
Admission: EM | Admit: 2017-03-30 | Discharge: 2017-03-30 | DRG: 638 | Discharge status: Against Medical Advice | Payer: Self-pay | Attending: Internal Medicine | Admitting: Internal Medicine

## 2017-03-30 DIAGNOSIS — E0810 Diabetes mellitus due to underlying condition with ketoacidosis without coma: Secondary | ICD-10-CM

## 2017-03-30 DIAGNOSIS — F17210 Nicotine dependence, cigarettes, uncomplicated: Secondary | ICD-10-CM | POA: Diagnosis present

## 2017-03-30 DIAGNOSIS — R9431 Abnormal electrocardiogram [ECG] [EKG]: Secondary | ICD-10-CM | POA: Diagnosis present

## 2017-03-30 DIAGNOSIS — Z5321 Procedure and treatment not carried out due to patient leaving prior to being seen by health care provider: Secondary | ICD-10-CM

## 2017-03-30 DIAGNOSIS — E1010 Type 1 diabetes mellitus with ketoacidosis without coma: Principal | ICD-10-CM | POA: Diagnosis present

## 2017-03-30 DIAGNOSIS — F17200 Nicotine dependence, unspecified, uncomplicated: Secondary | ICD-10-CM | POA: Diagnosis present

## 2017-03-30 DIAGNOSIS — E109 Type 1 diabetes mellitus without complications: Secondary | ICD-10-CM | POA: Diagnosis present

## 2017-03-30 DIAGNOSIS — N179 Acute kidney failure, unspecified: Secondary | ICD-10-CM | POA: Diagnosis present

## 2017-03-30 DIAGNOSIS — D72829 Elevated white blood cell count, unspecified: Secondary | ICD-10-CM | POA: Diagnosis present

## 2017-03-30 DIAGNOSIS — D72828 Other elevated white blood cell count: Secondary | ICD-10-CM

## 2017-03-30 DIAGNOSIS — Z720 Tobacco use: Secondary | ICD-10-CM

## 2017-03-30 DIAGNOSIS — Z833 Family history of diabetes mellitus: Secondary | ICD-10-CM

## 2017-03-30 DIAGNOSIS — E1110 Type 2 diabetes mellitus with ketoacidosis without coma: Secondary | ICD-10-CM | POA: Diagnosis present

## 2017-03-30 DIAGNOSIS — Z881 Allergy status to other antibiotic agents status: Secondary | ICD-10-CM

## 2017-03-30 LAB — ECHOCARDIOGRAM COMPLETE
E decel time: 239 msec
E/e' ratio: 5.84
FS: 37 % (ref 28–44)
Height: 71 in
IVS/LV PW RATIO, ED: 0.94
LA ID, A-P, ES: 30 mm
LA diam end sys: 30 mm
LA diam index: 1.58 cm/m2
LA vol A4C: 21.7 ml
LA vol index: 15.8 mL/m2
LA vol: 30.1 mL
LV E/e' medial: 5.84
LV E/e'average: 5.84
LV PW d: 8.79 mm — AB (ref 0.6–1.1)
LV e' LATERAL: 13 cm/s
LVOT SV: 59 mL
LVOT VTI: 18.9 cm
LVOT area: 3.14 cm2
LVOT diameter: 20 mm
LVOT peak grad rest: 5 mmHg
LVOT peak vel: 117 cm/s
Lateral S' vel: 13 cm/s
MV Dec: 239
MV Peak grad: 2 mmHg
MV pk A vel: 57.5 m/s
MV pk E vel: 75.9 m/s
TAPSE: 21.7 mm
TDI e' lateral: 13
TDI e' medial: 12.3
Weight: 2511.48 oz

## 2017-03-30 LAB — COMPREHENSIVE METABOLIC PANEL
ALK PHOS: 52 U/L (ref 38–126)
ALT: 24 U/L (ref 17–63)
AST: 19 U/L (ref 15–41)
Albumin: 3.2 g/dL — ABNORMAL LOW (ref 3.5–5.0)
Anion gap: 10 (ref 5–15)
BUN: 12 mg/dL (ref 6–20)
CALCIUM: 8.1 mg/dL — AB (ref 8.9–10.3)
CO2: 18 mmol/L — AB (ref 22–32)
CREATININE: 0.89 mg/dL (ref 0.61–1.24)
Chloride: 108 mmol/L (ref 101–111)
Glucose, Bld: 137 mg/dL — ABNORMAL HIGH (ref 65–99)
Potassium: 3.5 mmol/L (ref 3.5–5.1)
SODIUM: 136 mmol/L (ref 135–145)
Total Bilirubin: 1.3 mg/dL — ABNORMAL HIGH (ref 0.3–1.2)
Total Protein: 5.3 g/dL — ABNORMAL LOW (ref 6.5–8.1)

## 2017-03-30 LAB — CBG MONITORING, ED
GLUCOSE-CAPILLARY: 443 mg/dL — AB (ref 65–99)
GLUCOSE-CAPILLARY: 305 mg/dL — AB (ref 65–99)
GLUCOSE-CAPILLARY: 260 mg/dL — AB (ref 65–99)
GLUCOSE-CAPILLARY: 195 mg/dL — AB (ref 65–99)
Glucose-Capillary: 498 mg/dL — ABNORMAL HIGH (ref 65–99)

## 2017-03-30 LAB — BASIC METABOLIC PANEL
ANION GAP: 12 (ref 5–15)
ANION GAP: 26 — AB (ref 5–15)
ANION GAP: 9 (ref 5–15)
BUN: 17 mg/dL (ref 6–20)
BUN: 12 mg/dL (ref 6–20)
BUN: 9 mg/dL (ref 6–20)
CALCIUM: 10.1 mg/dL (ref 8.9–10.3)
CO2: 15 mmol/L — ABNORMAL LOW (ref 22–32)
CO2: 21 mmol/L — AB (ref 22–32)
CO2: 18 mmol/L — ABNORMAL LOW (ref 22–32)
CREATININE: 1.59 mg/dL — AB (ref 0.61–1.24)
Calcium: 8.2 mg/dL — ABNORMAL LOW (ref 8.9–10.3)
Calcium: 8.3 mg/dL — ABNORMAL LOW (ref 8.9–10.3)
Chloride: 93 mmol/L — ABNORMAL LOW (ref 101–111)
Chloride: 107 mmol/L (ref 101–111)
Chloride: 103 mmol/L (ref 101–111)
Creatinine, Ser: 1.11 mg/dL (ref 0.61–1.24)
Creatinine, Ser: 0.99 mg/dL (ref 0.61–1.24)
GFR calc non Af Amer: 57 mL/min — ABNORMAL LOW (ref >60)
GFR calc non Af Amer: >60 mL/min (ref >60)
GFR calc non Af Amer: >60 mL/min (ref >60)
Glucose, Bld: 436 mg/dL — ABNORMAL HIGH (ref 65–99)
Glucose, Bld: 162 mg/dL — ABNORMAL HIGH (ref 65–99)
Glucose, Bld: 300 mg/dL — ABNORMAL HIGH (ref 65–99)
POTASSIUM: 4.1 mmol/L (ref 3.5–5.1)
Potassium: 4.9 mmol/L (ref 3.5–5.1)
Potassium: 3.8 mmol/L (ref 3.5–5.1)
Sodium: 134 mmol/L — ABNORMAL LOW (ref 135–145)
Sodium: 137 mmol/L (ref 135–145)
Sodium: 133 mmol/L — ABNORMAL LOW (ref 135–145)

## 2017-03-30 LAB — GLUCOSE, CAPILLARY
GLUCOSE-CAPILLARY: 129 mg/dL — AB (ref 65–99)
GLUCOSE-CAPILLARY: 271 mg/dL — AB (ref 65–99)
Glucose-Capillary: 165 mg/dL — ABNORMAL HIGH (ref 65–99)
Glucose-Capillary: 142 mg/dL — ABNORMAL HIGH (ref 65–99)
Glucose-Capillary: 129 mg/dL — ABNORMAL HIGH (ref 65–99)
Glucose-Capillary: 134 mg/dL — ABNORMAL HIGH (ref 65–99)
Glucose-Capillary: 129 mg/dL — ABNORMAL HIGH (ref 65–99)
Glucose-Capillary: 182 mg/dL — ABNORMAL HIGH (ref 65–99)
Glucose-Capillary: 237 mg/dL — ABNORMAL HIGH (ref 65–99)

## 2017-03-30 LAB — URINALYSIS, ROUTINE W REFLEX MICROSCOPIC
BACTERIA UA: NONE SEEN
Bilirubin Urine: NEGATIVE
Glucose, UA: >=500 mg/dL — AB
Hgb urine dipstick: NEGATIVE
KETONES UR: 80 mg/dL — AB
Leukocytes, UA: NEGATIVE
NITRITE: NEGATIVE
PH: 5 (ref 5.0–8.0)
PROTEIN: NEGATIVE mg/dL
Specific Gravity, Urine: 1.026 (ref 1.005–1.030)
Squamous Epithelial / LPF: NONE SEEN

## 2017-03-30 LAB — CBC
HCT: 50.2 % (ref 39.0–52.0)
HCT: 37.9 % — ABNORMAL LOW (ref 39.0–52.0)
HEMOGLOBIN: 12.6 g/dL — AB (ref 13.0–17.0)
HEMOGLOBIN: 16.9 g/dL (ref 13.0–17.0)
MCH: 29.9 pg (ref 26.0–34.0)
MCH: 31 pg (ref 26.0–34.0)
MCHC: 33.7 g/dL (ref 30.0–36.0)
MCHC: 33.2 g/dL (ref 30.0–36.0)
MCV: 92.1 fL (ref 78.0–100.0)
MCV: 90 fL (ref 78.0–100.0)
Platelets: 327 10*3/uL (ref 150–400)
Platelets: 200 10*3/uL (ref 150–400)
RBC: 5.45 MIL/uL (ref 4.22–5.81)
RBC: 4.21 MIL/uL — AB (ref 4.22–5.81)
RDW: 12.4 % (ref 11.5–15.5)
RDW: 12.5 % (ref 11.5–15.5)
WBC: 17.4 10*3/uL — ABNORMAL HIGH (ref 4.0–10.5)
WBC: 11.6 10*3/uL — AB (ref 4.0–10.5)

## 2017-03-30 LAB — I-STAT VENOUS BLOOD GAS, ED
Acid-base deficit: 14 mmol/L — ABNORMAL HIGH (ref 0.0–2.0)
Bicarbonate: 12.6 mmol/L — ABNORMAL LOW (ref 20.0–28.0)
O2 SAT: 17 %
PH VEN: 7.209 — AB (ref 7.250–7.430)
TCO2: 14 mmol/L (ref 0–100)
pCO2, Ven: 31.7 mmHg — ABNORMAL LOW (ref 44.0–60.0)
pO2, Ven: 16 mmHg — CL (ref 32.0–45.0)

## 2017-03-30 LAB — HIV ANTIBODY (ROUTINE TESTING W REFLEX): HIV Screen 4th Generation wRfx: NONREACTIVE

## 2017-03-30 LAB — PHOSPHORUS: Phosphorus: 4.9 mg/dL — ABNORMAL HIGH (ref 2.5–4.6)

## 2017-03-30 LAB — MAGNESIUM: Magnesium: 1.8 mg/dL (ref 1.7–2.4)

## 2017-03-30 LAB — MRSA PCR SCREENING: MRSA by PCR: NEGATIVE

## 2017-03-30 MED ORDER — POTASSIUM CHLORIDE 10 MEQ/100ML IV SOLN
10.0000 meq | INTRAVENOUS | Status: AC
  Administered 2017-03-30: 10 meq via INTRAVENOUS
  Filled 2017-03-30 (×2): qty 100

## 2017-03-30 MED ORDER — HEPARIN SODIUM (PORCINE) 5000 UNIT/ML IJ SOLN
5000.0000 [IU] | Freq: Three times a day (TID) | INTRAMUSCULAR | Status: DC
  Administered 2017-03-30 (×2): 5000 [IU] via SUBCUTANEOUS
  Filled 2017-03-30 (×2): qty 1

## 2017-03-30 MED ORDER — INSULIN GLARGINE 100 UNIT/ML ~~LOC~~ SOLN
10.0000 [IU] | Freq: Every day | SUBCUTANEOUS | Status: DC
  Administered 2017-03-30: 10 [IU] via SUBCUTANEOUS
  Filled 2017-03-30: qty 0.1

## 2017-03-30 MED ORDER — INSULIN REGULAR HUMAN 100 UNIT/ML IJ SOLN
INTRAMUSCULAR | Status: DC
  Administered 2017-03-30: 3.8 [IU]/h via INTRAVENOUS
  Filled 2017-03-30: qty 1

## 2017-03-30 MED ORDER — SODIUM CHLORIDE 0.9 % IV SOLN: INTRAVENOUS | Status: DC

## 2017-03-30 MED ORDER — SODIUM CHLORIDE 0.9 % IV SOLN
INTRAVENOUS | Status: DC
  Filled 2017-03-30: qty 1

## 2017-03-30 MED ORDER — SODIUM CHLORIDE 0.9 % IV BOLUS (SEPSIS)
1000.0000 mL | Freq: Once | INTRAVENOUS | Status: AC
  Administered 2017-03-30: 1000 mL via INTRAVENOUS

## 2017-03-30 MED ORDER — DEXTROSE-NACL 5-0.45 % IV SOLN
INTRAVENOUS | Status: DC
Start: 2017-03-30 — End: 2017-03-30
  Administered 2017-03-30: 1000 mL via INTRAVENOUS

## 2017-03-30 MED ORDER — INSULIN ASPART 100 UNIT/ML ~~LOC~~ SOLN
0.0000 [IU] | SUBCUTANEOUS | Status: DC
  Administered 2017-03-30: 2 [IU] via SUBCUTANEOUS
  Administered 2017-03-30: 3 [IU] via SUBCUTANEOUS

## 2017-03-30 MED ORDER — SODIUM CHLORIDE 0.9 % IV BOLUS (SEPSIS)
2000.0000 mL | Freq: Once | INTRAVENOUS | Status: AC
  Administered 2017-03-30: 2000 mL via INTRAVENOUS

## 2017-03-30 MED ORDER — INSULIN ASPART PROT & ASPART (70-30 MIX) 100 UNIT/ML ~~LOC~~ SUSP
18.0000 [IU] | Freq: Two times a day (BID) | SUBCUTANEOUS | Status: DC
  Filled 2017-03-30: qty 10

## 2017-03-30 MED ORDER — PROMETHAZINE HCL 25 MG/ML IJ SOLN
25.0000 mg | Freq: Once | INTRAMUSCULAR | Status: AC
  Administered 2017-03-30: 25 mg via INTRAVENOUS
  Filled 2017-03-30: qty 1

## 2017-03-30 MED ORDER — DEXTROSE-NACL 5-0.45 % IV SOLN: INTRAVENOUS | Status: DC

## 2017-03-30 NOTE — Progress Notes (Signed)
Patient tolerating Carb Mod diet.  No nausea present.  Patient requesting to be discharged.  MD notified.  No response at this time.  Next shift informed.  Peri MarisAndrew Maddox Hlavaty, MBA, BSN, RN

## 2017-03-30 NOTE — Progress Notes (Signed)
Inpatient Diabetes Program Recommendations  AACE/ADA: New Consensus Statement on Inpatient Glycemic Control (2015)  Target Ranges:  Prepandial:   less than 140 mg/dL      Peak postprandial:   less than 180 mg/dL (1-2 hours)      Critically ill patients:  140 - 180 mg/dL   Review of Glycemic Control  Diabetes history: DM 1  Consult for Resources Patient has been seen 07/02/16 and 10/08/16 and was spoken to at length about [employer] insulins which included Regular, NPH and 70/30. Unsure why he was taking only regular insulin. He was counseled on his insulin requirements and needs with Diabetes 1. He was given copay cards at one time but since patient no longer has commercial insurance they will no longer be an option. Dosing patient on 70/30 insulin at time of transition or basal bolus with NPH and Regular unless [facility] is the options that are available at this time.   Consider 70/30 18 units BID at time of transition when acidosis clears, in addition to Novolog Sensitive Correction.  Thanks,  Zachary DeemShannon Emmanuell Kantz RN, MSN, [facility] Inpatient Diabetes Coordinator Team Pager [phone] (8a-5p)

## 2017-03-30 NOTE — ED Triage Notes (Signed)
Pt endorses "I think I'm in DKA" Pt has been vomiting all day. Has not taken cbg. Tachycardic and vomiting in triage.

## 2017-03-30 NOTE — Plan of Care (Signed)
Problem: Education: Goal: Ability to describe self-care measures that may prevent or decrease complications (Diabetes Survival Skills Education) will improve Outcome: Progressing Discussed the reason for keeping the patient NPO with some teach back displayed

## 2017-03-30 NOTE — Progress Notes (Signed)
  Echocardiogram 2D Echocardiogram has been performed.  Arvil ChacoFoster, Lynise Porr 03/30/2017, 1:05 PM

## 2017-03-30 NOTE — Progress Notes (Signed)
PROGRESS NOTE    Anias Bartol  ZOX:[id number] DOB: Jun 12, 1986 DOA: 03/30/2017 PCP: Patient, No Pcp Per   Brief Narrative:  31 y.o. WM PMHx type 1 diabetes mellitus, history of previous DKA episodes who is coming to the ER with complaints of multiple episodes of nausea and vomiting since Friday evening. He denies fever, chills, but complains of fatigue and dyspnea, which has improved since IVF and IV insulin started. He denies sore throat, productive cough, chest pain, dizziness, palpitations, orthopnea, pitting edema of the lower extremities. He denies abdominal pain, diarrhea, constipation, melena or hematochezia. He denies dysuria, frequency or hematuria. He mentions that he has been doing sliding scale insulin, but does not use a long-acting insulin to provide his basal metabolic needs. Per patient, this is due to lack of health insurance and stated that he buys his regular insulin from [facility] without a prescription.  ED Course: The patient received NS 2,000 mL bolus, phenergan 25 mg IV and was started on an insulin infusion. Lab work showed WBC of 17.4, hemoglobin 16.9 g/dL and platelets 811. A venous gas showed a pH of 7.20, bicarbonate of 12.6 and a base deficit of 14. Sodium was 134, potassium 4.9, chloride 93, bicarbonate 15 with an anion gap of 26 mmol/L. BUN was 17, creatinine 1.59 ( in December 2017 was 0.82) and glucose of 436 mg/dL. EKG shows sinus tachycardia, left axis deviation and age undetermined anterior infarct.   Subjective: 6/3  A/O 4, states feels much better than at admission. States uses NovoLog N and NovoLog R from [place].    Assessment & Plan:   Principal Problem:   DKA (diabetic ketoacidosis) (HCC) Active Problems:   Tobacco use disorder   Type 1 diabetes mellitus (HCC)   Leukocytosis   Abnormal EKG   AKI (acute kidney injury) (HCC)   DKA (diabetic ketoacidosis) (HCC)   Type 1 diabetes mellitus (HCC) uncontrolled with complication -Resolved -09/2016  Hemoglobin A1c= 11.8: New hemoglobin A1c pending -Will start patient on NovoLog 70/3018 units BID per diabetic coordinator recommendation -Sensitive SSI -Spoke at length with patient on sequela of having uncontrolled diabetes to include blindness, increased risk of MI/CVA, impotence, neuropathy, early death.    AKI (acute kidney injury) (HCC) -Secondary to emesis and decreased oral intake. -Continue IV fluids. Lab Results  Component Value Date   CREATININE 0.99 03/30/2017   CREATININE 0.89 03/30/2017   CREATININE 1.11 03/30/2017  -Resolved    Tobacco abuse -Declined nicotine replacement therapy. -Will provide with smoking cessation information.      Leukocytosis -He denies fever or symptoms of infection. He may be hemoconcentrated. -Follow-up WBC count later this morning, monitor for fever or other signs of infection.  Abnormal EKG -Echocardiogram. Shows normal LV function see results below   DVT prophylaxis: Heparin SQ. Code Status: Full code. Family Communication:  none Disposition Plan:  DC on 6/4 after LCSW has spoken with patient concerning with medication      Consultants:  NA    Procedures/Significant Events:  6/3 echocardiogram:Left ventricle: The cavity size was normal. LVEF =60%- 65%.     VENTILATOR SETTINGS: NA   Cultures NA  Antimicrobials: NA   Devices NA    LINES / TUBES:  NA    Continuous Infusions: . sodium chloride    . dextrose 5 % and 0.45% NaCl Stopped (03/30/17 0545)  . dextrose 5 % and 0.45% NaCl 125 mL/hr at 03/30/17 0545  . insulin (NOVOLIN-R) infusion 1.4 Units/hr (03/30/17 0757)  . potassium chloride 10  mEq (03/30/17 0653)     Objective: Vitals:   03/30/17 0513 03/30/17 0600 03/30/17 0700 03/30/17 0747  BP: 118/74 111/65 106/68 (!) 89/50  Pulse: (!) 117 99 98 (!) 110  Resp: 17 18 19 19   Temp: 99.1 F (37.3 C)   98.7 F (37.1 C)  TempSrc: Oral   Oral  SpO2: 99% 98% 97% 97%  Weight:      Height:         Intake/Output Summary (Last 24 hours) at 03/30/17 0758 Last data filed at 03/30/17 9147  Gross per 24 hour  Intake          6362.33 ml  Output             1000 ml  Net          5362.33 ml   Filed Weights   03/30/17 0032 03/30/17 0507  Weight: 150 lb (68 kg) 156 lb 15.5 oz (71.2 kg)    Examination:  General: A/O 4, NAD, No acute respiratory distress Eyes: negative scleral hemorrhage, negative anisocoria, negative icterus ENT: Negative Runny nose, negative gingival bleeding, Neck:  Negative scars, masses, torticollis, lymphadenopathy, JVD Lungs: Clear to auscultation bilaterally without wheezes or crackles Cardiovascular: Regular rate and rhythm without murmur gallop or rub normal S1 and S2 Abdomen: negative abdominal pain, nondistended, positive soft, bowel sounds, no rebound, no ascites, no appreciable mass Extremities: No significant cyanosis, clubbing, or edema bilateral lower extremities Skin: Negative rashes, lesions, ulcers Psychiatric:  Negative depression, negative anxiety, negative fatigue, negative mania  Central nervous system:  Cranial nerves II through XII intact, tongue/uvula midline, all extremities muscle strength 5/5, sensation intact throughout, negative dysarthria, negative expressive aphasia, negative receptive aphasia.  .     Data Reviewed: Care during the described time interval was provided by me .  I have reviewed this patient's available data, including medical history, events of note, physical examination, and all test results as part of my evaluation. I have personally reviewed and interpreted all radiology studies.  CBC:  Recent Labs Lab 03/30/17 0048  WBC 17.4*  HGB 16.9  HCT 50.2  MCV 92.1  PLT 327   Basic Metabolic Panel:  Recent Labs Lab 03/30/17 0048 03/30/17 0604  NA 134* 137  K 4.9 4.1  CL 93* 107  CO2 15* 18*  GLUCOSE 436* 162*  BUN 17 12  CREATININE 1.59* 1.11  CALCIUM 10.1 8.2*  MG 1.8  --   PHOS 4.9*  --     GFR: Estimated Creatinine Clearance: 98.9 mL/min (by C-G formula based on SCr of 1.11 mg/dL). Liver Function Tests: No results for input(s): AST, ALT, ALKPHOS, BILITOT, PROT, ALBUMIN in the last 168 hours. No results for input(s): LIPASE, AMYLASE in the last 168 hours. No results for input(s): AMMONIA in the last 168 hours. Coagulation Profile: No results for input(s): INR, PROTIME in the last 168 hours. Cardiac Enzymes: No results for input(s): CKTOTAL, CKMB, CKMBINDEX, TROPONINI in the last 168 hours. BNP (last 3 results) No results for input(s): PROBNP in the last 8760 hours. HbA1C: No results for input(s): HGBA1C in the last 72 hours. CBG:  Recent Labs Lab 03/30/17 0209 03/30/17 0302 03/30/17 0425 03/30/17 0534 03/30/17 0649  GLUCAP 305* 260* 195* 165* 142*   Lipid Profile: No results for input(s): CHOL, HDL, LDLCALC, TRIG, CHOLHDL, LDLDIRECT in the last 72 hours. Thyroid Function Tests: No results for input(s): TSH, T4TOTAL, FREET4, T3FREE, THYROIDAB in the last 72 hours. Anemia Panel: No results for input(s): VITAMINB12,  FOLATE, FERRITIN, TIBC, IRON, RETICCTPCT in the last 72 hours. Urine analysis:    Component Value Date/Time   COLORURINE STRAW (A) 03/30/2017 0440   APPEARANCEUR CLEAR 03/30/2017 0440   LABSPEC 1.026 03/30/2017 0440   PHURINE 5.0 03/30/2017 0440   GLUCOSEU >=500 (A) 03/30/2017 0440   HGBUR NEGATIVE 03/30/2017 0440   BILIRUBINUR NEGATIVE 03/30/2017 0440   KETONESUR 80 (A) 03/30/2017 0440   PROTEINUR NEGATIVE 03/30/2017 0440   UROBILINOGEN 0.2 10/04/2014 1549   NITRITE NEGATIVE 03/30/2017 0440   LEUKOCYTESUR NEGATIVE 03/30/2017 0440   Sepsis Labs: @LABRCNTIP (procalcitonin:4,lacticidven:4)  ) Recent Results (from the past 240 hour(s))  MRSA PCR Screening     Status: None   Collection Time: 03/30/17  5:13 AM  Result Value Ref Range Status   MRSA by PCR NEGATIVE NEGATIVE Final    Comment:        The GeneXpert MRSA Assay (FDA approved for  NASAL specimens only), is one component of a comprehensive MRSA colonization surveillance program. It is not intended to diagnose MRSA infection nor to guide or monitor treatment for MRSA infections.          Radiology Studies: No results found.      Scheduled Meds: . heparin  5,000 Units Subcutaneous Q8H   Continuous Infusions: . sodium chloride    . dextrose 5 % and 0.45% NaCl Stopped (03/30/17 0545)  . dextrose 5 % and 0.45% NaCl 125 mL/hr at 03/30/17 0545  . insulin (NOVOLIN-R) infusion 1.4 Units/hr (03/30/17 0757)  . potassium chloride 10 mEq (03/30/17 0653)     LOS: 0 days    Time spent: 40 minutes    Bradie Sangiovanni, Roselind MessierURTIS J, MD [facility] Pager [phone]   If 7PM-7AM, please contact night-coverage [email or web address] Password [email or web address] 03/30/2017, 7:58 AM

## 2017-03-30 NOTE — ED Provider Notes (Signed)
[facility]-EMERGENCY DEPT Provider Note   CSN: [id number] Arrival date & time: 03/30/17  0024  By signing my name below, I, Phillips Climes, attest that this documentation has been prepared under the direction and in the presence of Bobette Mo, MD . Electronically Signed: Phillips Climes, Scribe. 03/30/2017. 3:23 AM.  History   Chief Complaint Chief Complaint  Patient presents with  . Hyperglycemia    HPI Comments Zachary Vazquez is a 31 y.o. male with a PMHx significant for Type 1 diabetes, who presents to the Emergency Department with complaints of nausea and 6 episodes of vomiting x1 day. Unable to keep anything down. Pt with hx of DKA and multiple admissions.  States that sx feel similar. Pt denies experiencing any other acute sx, including fever or diarrhea.  Pt does not regularly check his blood sugar and is not long acting insulin, just takes sliding scale insulin.  He is without health insurance and buys insulin from [facility] without a prescription.   The history is provided by the patient and medical records. No language interpreter was used.    Past Medical History:  Diagnosis Date  . Type 1 diabetes mellitus [facility])     Patient Active Problem List   Diagnosis Date Noted  . DKA (diabetic ketoacidosis) (HCC) 03/30/2017  . Tobacco use disorder 03/30/2017  . Type 1 diabetes mellitus (HCC) 03/30/2017  . Leukocytosis 03/30/2017  . DKA, type 1 (HCC) 07/01/2016  . DKA (diabetic ketoacidoses) (HCC) 10/04/2014    Past Surgical History:  Procedure Laterality Date  . TONSILLECTOMY       Home Medications    Prior to Admission medications   Medication Sig Start Date End Date Taking? Authorizing Provider  insulin NPH-regular Human (NOVOLIN 70/30) (70-30) 100 UNIT/ML injection Inject 20 Units into the skin 2 (two) times daily with a meal. 10/08/16  Yes Alford Highland, MD    Family History Family History  Problem Relation Age of Onset  . Hypertension Mother   . Diabetes  Mellitus I Brother     Social History Social History  Substance Use Topics  . Smoking status: Current Every Day Smoker    Packs/day: 1.00    Years: 15.00    Types: Cigarettes  . Smokeless tobacco: Never Used  . Alcohol use No     Allergies   Clindamycin/lincomycin   Review of Systems Review of Systems  Constitutional: Negative for chills and fever.  Respiratory: Negative for shortness of breath.   Cardiovascular: Negative for chest pain.  Gastrointestinal: Positive for nausea and vomiting. Negative for diarrhea.  All other systems reviewed and are negative.  Physical Exam Updated Vital Signs BP 119/76   Pulse (!) 126   Temp 97.6 F (36.4 C) (Oral)   Resp 19   Ht 5\' 11"  (1.803 m)   Wt 68 kg (150 lb)   SpO2 100%   BMI 20.92 kg/m   Physical Exam  Constitutional: He is oriented to person, place, and time. He appears well-developed and well-nourished.  Mildly dehydrated.  HENT:  Head: Normocephalic and atraumatic.   Mucous membranes slightly dry.  Eyes: EOM are normal. Pupils are equal, round, and reactive to light.  Neck: Neck supple.  Cardiovascular: Regular rhythm.   Slightly tachycardic.  Pulmonary/Chest: No respiratory distress. He has no wheezes.  Abdominal: Soft. There is no tenderness.  Musculoskeletal: Normal range of motion. He exhibits no edema.  Neurological: He is alert and oriented to person, place, and time. No cranial nerve deficit.  Skin: Skin is warm and dry.  Psychiatric: He has a normal mood and affect.  Nursing note and vitals reviewed.  ED Treatments / Results  DIAGNOSTIC STUDIES: Oxygen Saturation is 100% on room air, normal by my interpretation.    COORDINATION OF CARE: 2:29 AM Discussed treatment plan with pt at bedside and pt agreed to plan.  Pt without insurance or PCP, so plan to get him resources.   Labs (all labs ordered are listed, but only abnormal results are displayed) Labs Reviewed  CBC - Abnormal; Notable for the  following:       Result Value   WBC 17.4 (*)    All other components within normal limits  BASIC METABOLIC PANEL - Abnormal; Notable for the following:    Sodium 134 (*)    Chloride 93 (*)    CO2 15 (*)    Glucose, Bld 436 (*)    Creatinine, Ser 1.59 (*)    GFR calc non Af Amer 57 (*)    Anion gap 26 (*)    All other components within normal limits  PHOSPHORUS - Abnormal; Notable for the following:    Phosphorus 4.9 (*)    All other components within normal limits  CBG MONITORING, ED - Abnormal; Notable for the following:    Glucose-Capillary 498 (*)    All other components within normal limits  CBG MONITORING, ED - Abnormal; Notable for the following:    Glucose-Capillary 443 (*)    All other components within normal limits  CBG MONITORING, ED - Abnormal; Notable for the following:    Glucose-Capillary 305 (*)    All other components within normal limits  I-STAT VENOUS BLOOD GAS, ED - Abnormal; Notable for the following:    pH, Ven 7.209 (*)    pCO2, Ven 31.7 (*)    pO2, Ven 16.0 (*)    Bicarbonate 12.6 (*)    Acid-base deficit 14.0 (*)    All other components within normal limits  CBG MONITORING, ED - Abnormal; Notable for the following:    Glucose-Capillary 260 (*)    All other components within normal limits  MAGNESIUM  URINALYSIS, ROUTINE W REFLEX MICROSCOPIC  BLOOD GAS, VENOUS    EKG  EKG Interpretation  Date/Time:  Sunday March 30 2017 00:34:20 EDT Ventricular Rate:  155 PR Interval:  114 QRS Duration: 94 QT Interval:  334 QTC Calculation: 536 R Axis:   -90 Text Interpretation:  Sinus tachycardia Left axis deviation Anterior infarct , age undetermined Abnormal ECG No previous ECGs available Confirmed by Richardean CanalYao, Devansh Riese H [phone]([id number]) on 03/30/2017 1:50:06 AM       Radiology No results found.  Procedures Procedures (including critical care time)  Medications Ordered in ED Medications  dextrose 5 %-0.45 % sodium chloride infusion (not administered)  insulin  regular (NOVOLIN R,HUMULIN R) 100 Units in sodium chloride 0.9 % 100 mL (1 Units/mL) infusion (3.8 Units/hr Intravenous New Bag/Given 03/30/17 0119)  sodium chloride 0.9 % bolus 2,000 mL (0 mLs Intravenous Stopped 03/30/17 0312)  promethazine (PHENERGAN) injection 25 mg (25 mg Intravenous Given 03/30/17 0110)     Initial Impression / Assessment and Plan / ED Course  I have reviewed the triage vital signs and the nursing notes.  Pertinent labs & imaging results that were available during my care of the patient were reviewed by me and considered in my medical decision making (see chart for details).    Zachary Vazquez is a 31 y.o. male here with vomiting. Has type  1 DM and is uncompliant with meds and doesn't check FS regularly. I am concerned for DKA. Will get labs, UA, VBG. Will hydrate and start insulin drip.   3 am VBG showed pH 7.2. Bicarb 12. Chemistry showed AG 26. Started on [employer]. Hospitalist to admit for DKA.    I personally performed the services described in this documentation, which was scribed in my presence. The recorded information has been reviewed and is accurate.   Final Clinical Impressions(s) / ED Diagnoses   Final diagnoses:  None    New Prescriptions New Prescriptions   No medications on file     Charlynne Pander, MD 03/30/17 [phone]

## 2017-03-30 NOTE — ED Notes (Signed)
Collected blood for vbg

## 2017-03-30 NOTE — H&P (Addendum)
History and Physical    Zachary Vazquez ZOX:[id number] DOB: 04/19/1986 DOA: 03/30/2017  PCP: Patient, No Pcp Per   Patient coming from: Home.  I have personally briefly [facility]  Chief Complaint: " I feel like having DKA"  HPI: Zachary OberBrandon Vazquez is a 31 y.o. male with medical history significant of type 1 diabetes mellitus, history of previous DKA episodes who is coming to the ER with complaints of multiple episodes of nausea and vomiting since Friday evening. He denies fever, chills, but complains of fatigue and dyspnea, which has improved since IVF and IV insulin started. He denies sore throat, productive cough, chest pain, dizziness, palpitations, orthopnea, pitting edema of the lower extremities. He denies abdominal pain, diarrhea, constipation, melena or hematochezia. He denies dysuria, frequency or hematuria. He mentions that he has been doing sliding scale insulin, but does not use a long-acting insulin to provide his basal metabolic needs. Per patient, this is due to lack of health insurance and stated that he [facility].  ED Course: The patient received NS 2,000 mL bolus, phenergan 25 mg IV and was started on an insulin infusion. Lab work showed WBC of 17.4, hemoglobin 16.9 g/dL and platelets [id number]. A venous gas showed a pH of 7.20, bicarbonate of 12.6 and a base deficit of 14. Sodium was 134, potassium 4.9, chloride 93, bicarbonate 15 with an anion gap of 26 mmol/L. BUN was 17, creatinine 1.59 ( in December 2017 was 0.82) and glucose of 436 mg/dL. EKG shows sinus tachycardia, left axis deviation and age undetermined anterior infarct.  Review of Systems: As per HPI otherwise 10 point review of systems negative.   Past Medical History:  Diagnosis Date  . Type 1 diabetes mellitus (HCC)     Past Surgical History:  Procedure Laterality Date  . TONSILLECTOMY       reports that he has been  smoking Cigarettes.  He has a 15.00 pack-year smoking history. He has never used smokeless tobacco. He reports that he uses drugs, including Marijuana. He reports that he does not drink alcohol.  Allergies  Allergen Reactions  . Clindamycin/Lincomycin Anaphylaxis    Family History  Problem Relation Age of Onset  . Hypertension Mother   . Diabetes Mellitus I Brother     Prior to Admission medications   Medication Sig Start Date End Date Taking? Authorizing Provider  insulin NPH-regular Human (NOVOLIN 70/30) (70-30) 100 UNIT/ML injection Inject 20 Units into the skin 2 (two) times daily with a meal. 10/08/16  Yes Alford HighlandWieting, Richard, MD    Physical Exam: Vitals:   03/30/17 0032 03/30/17 0115 03/30/17 0116 03/30/17 0116  BP:  119/76    Pulse:   (!) 126   Resp:  18 19   Temp:      TempSrc:      SpO2:   97% 100%  Weight: 68 kg (150 lb)     Height: 5\' 11"  (1.803 m)      Constitutional: NAD, calm, comfortable Eyes: PERRL, lids and conjunctivae normal ENMT: Mucous membranes are mildly dry. Posterior pharynx clear of any exudate or lesions. Neck: normal, supple, no masses, no thyromegaly Respiratory: clear to auscultation bilaterally, no wheezing, no crackles. Normal respiratory effort. No accessory muscle use.  Cardiovascular: Tachycardic at 116 BPM, no murmurs / rubs / gallops. No extremity edema. 2+ pedal pulses. No carotid bruits.  Abdomen: Soft, no tenderness, no masses palpated. No hepatosplenomegaly. Bowel sounds positive.  Musculoskeletal: no clubbing / cyanosis. Good ROM, no contractures. Normal muscle tone.  Skin: no rashes, lesions, ulcers on limited skin exam. Neurologic: CN 2-12 grossly intact. Sensation intact, DTR normal. Strength 5/5 in all 4.  Psychiatric: Normal judgment and insight. Alert and oriented x 4. Normal mood.    Labs on Admission: I have personally reviewed following labs and imaging studies  CBC:  Recent Labs Lab 03/30/17 0048  WBC 17.4*  HGB 16.9    HCT 50.2  MCV 92.1  PLT 327   Basic Metabolic Panel:  Recent Labs Lab 03/30/17 0048  NA 134*  K 4.9  CL 93*  CO2 15*  GLUCOSE 436*  BUN 17  CREATININE 1.59*  CALCIUM 10.1   GFR: Estimated Creatinine Clearance: 65.9 mL/min (A) (by C-G formula based on SCr of 1.59 mg/dL (H)). Liver Function Tests: No results for input(s): AST, ALT, ALKPHOS, BILITOT, PROT, ALBUMIN in the last 168 hours. No results for input(s): LIPASE, AMYLASE in the last 168 hours. No results for input(s): AMMONIA in the last 168 hours. Coagulation Profile: No results for input(s): INR, PROTIME in the last 168 hours. Cardiac Enzymes: No results for input(s): CKTOTAL, CKMB, CKMBINDEX, TROPONINI in the last 168 hours. BNP (last 3 results) No results for input(s): PROBNP in the last 8760 hours. HbA1C: No results for input(s): HGBA1C in the last 72 hours. CBG:  Recent Labs Lab 03/30/17 0039 03/30/17 0102 03/30/17 0209  GLUCAP 498* 443* 305*   Lipid Profile: No results for input(s): CHOL, HDL, LDLCALC, TRIG, CHOLHDL, LDLDIRECT in the last 72 hours. Thyroid Function Tests: No results for input(s): TSH, T4TOTAL, FREET4, T3FREE, THYROIDAB in the last 72 hours. Anemia Panel: No results for input(s): VITAMINB12, FOLATE, FERRITIN, TIBC, IRON, RETICCTPCT in the last 72 hours. Urine analysis:    Component Value Date/Time   COLORURINE YELLOW (A) 10/08/2016 1235   APPEARANCEUR CLEAR (A) 10/08/2016 1235   LABSPEC 1.030 10/08/2016 1235   PHURINE 6.0 10/08/2016 1235   GLUCOSEU >=500 (A) 10/08/2016 1235   HGBUR NEGATIVE 10/08/2016 1235   BILIRUBINUR NEGATIVE 10/08/2016 1235   KETONESUR 80 (A) 10/08/2016 1235   PROTEINUR NEGATIVE 10/08/2016 1235   UROBILINOGEN 0.2 10/04/2014 1549   NITRITE NEGATIVE 10/08/2016 1235   LEUKOCYTESUR NEGATIVE 10/08/2016 1235    Radiological Exams on Admission: No results found.  EKG: Independently reviewed. Vent. rate 155 BPM PR interval 114 ms QRS duration 94 ms QT/QTc  334/536 ms P-R-T axes 86 270 83 Sinus tachycardia Left axis deviation Anterior infarct , age undetermined Abnormal ECG  Assessment/Plan Principal Problem:   DKA (diabetic ketoacidosis) (HCC)   Type 1 diabetes mellitus (HCC) Admit to stepdown/inpatient. Keep nothing by mouth for now. Continue IV hydration. Continue insulin infusion. Monitor CBG, electrolytes and anion gap. The patient should be on a long-acting insulin formulation to provide for basal insulin needs and avoid unnecessary DKA episodes.  Active Problems:   AKI (acute kidney injury) (HCC) Secondary to emesis and decreased oral intake. Continue IV fluids. Follow-up renal function and electrolytes.    Tobacco use disorder Declined nicotine replacement therapy. Will provide with smoking cessation information.      Leukocytosis He denies fever or symptoms of infection. He may be hemoconcentrated. Follow-up WBC count later this morning, monitor for fever or other signs of infection.    Abnormal EKG Check echocardiogram.   DVT prophylaxis: Heparin SQ. Code Status: Full code. Family Communication:  Disposition Plan: Admit to SDU for insulin infusion and rehydration. Consults called:  Admission status:  Inpatient/SDU.   Bobette Mo MD [facility] Pager [phone].  If 7PM-7AM, please contact night-coverage [email or web address] Password [email or web address]  03/30/2017, 2:55 AM

## 2017-03-30 NOTE — ED Notes (Signed)
Report attempted however RN did not answer her phone.

## 2017-03-30 NOTE — Progress Notes (Signed)
MD called back trying to get patient setup with programs for his long acting medications and assistance since insulin drip was just stopped this afternoon.  Explained to the patient the side effects that can happen based his discharge against medical advice.  Patient worried about missing work for the money and even an excuse from the doctor would not help.  Dr. Joseph ArtWoods aware of the situation and had a long conversation with the pros and cons with the patient earlier in the day.  Patient is alert and oriented to person, place, time and situation.  Patient is stable with last vital signs charted (see EPIC flow sheet) in stable condition leaving walking of his own accord.

## 2017-03-31 LAB — HEMOGLOBIN A1C
Hgb A1c MFr Bld: 15.3 % — ABNORMAL HIGH (ref 4.8–5.6)
Mean Plasma Glucose: 392 mg/dL

## 2017-04-01 NOTE — Discharge Summary (Signed)
Physician Discharge Summary  Xan Ingraham WJX:[id number] DOB: [birth year] DOA: 03/30/2017  PCP: Patient, No Pcp Per  Admit date: 03/30/2017 Discharge date: 04/01/2017  Admitted From: Home Disposition:  Home AMA  Recommendations for Outpatient Follow-up:  DKA: Patient was counseled extensively on sequela of not controlling his diabetes to include loss of vision, neuropathy, impotence, increased risk of MI/CVA, early death. Patient does not have appropriate medication at home unable to purchase appropriate medication. Patient was counseled that we were attempting to have LCSW see him for placement on [facility] program, or other assistance in obtaining proper medication prior to discharge. Patient refused to stay in order to obtain proper medication left AMA     Home Health:No Equipment/Devices:  Discharge Condition: Stable CODE STATUS: Full Diet recommendation: American diabetic Association  Brief/Interim Summary: 31 y.o.WM PMHx type 1 diabetes mellitus, history of previous DKA episodes who is coming to the ER with complaints of multiple episodes of nausea and vomiting since Friday evening. He denies fever, chills, but complains of fatigue and dyspnea, which has improved since IVF and IV insulin started. He denies sore throat, productive cough, chest pain, dizziness, palpitations, orthopnea, pitting edema of the lower extremities. He denies abdominal pain, diarrhea, constipation, melena or hematochezia. He denies dysuria, frequency or hematuria. He mentions that he has been doing sliding scale insulin, but does not use a long-acting insulin to provide his basal metabolic needs. Per patient, this is due to lack of health insurance and stated that he buys his regular insulin from [facility] without a prescription.  ED Course:The patient received NS 2,000 mL bolus, phenergan 25 mg IV and was started on an insulin infusion. Lab work showed WBC of 17.4, hemoglobin 16.9 g/dL and platelets 213. Avenous gas  showed a pH of 7.20, bicarbonate of 12.6 and a base deficit of 14. Sodium was 134, potassium 4.9, chloride 93, bicarbonate 15 with an anion gap of 26 mmol/L. BUN was 17, creatinine 1.59 ( in December 2017 was 0.82) and glucose of 436 mg/dL. EKG shows sinus tachycardia, left axis deviation and age undetermined anterior infarct.  Patient does not have appropriate medication at home unable to purchase appropriate medication. Patient was counseled that we were attempting to have LCSW see him for placement on [facility] program, or other assistance in obtaining proper medication prior to discharge. Patient refused to stay in order to obtain proper medication left AMA   Discharge Diagnoses:  Principal Problem:   DKA (diabetic ketoacidosis) (HCC) Active Problems:   Tobacco use disorder   Type 1 diabetes mellitus (HCC)   Leukocytosis   Abnormal EKG   AKI (acute kidney injury) [facility])    Discharge Instructions   Allergies as of 03/30/2017      Reactions   Clindamycin/lincomycin Anaphylaxis      Medication List    ASK your doctor about these medications   insulin NPH-regular Human (70-30) 100 UNIT/ML injection Commonly known as:  NOVOLIN 70/30 Inject 20 Units into the skin 2 (two) times daily with a meal.       Allergies  Allergen Reactions  . Clindamycin/Lincomycin Anaphylaxis    Consultations:  Procedures/Studies:   Subjective:   Discharge Exam: Vitals:   03/30/17 1900 03/30/17 1949  BP: 135/85 139/70  Pulse: 87 89  Resp: 20 20  Temp:  98.8 F (37.1 C)   Vitals:   03/30/17 1700 03/30/17 1800 03/30/17 1900 03/30/17 1949  BP: 124/88 (!) 142/80 135/85 139/70  Pulse: 89 86 87 89  Resp: (!) 21 18  20 20  Temp:    98.8 F (37.1 C)  TempSrc:    Oral  SpO2: 99% 98% 100%   Weight:      Height:        General: Pt is alert, awake, not in acute distress Cardiovascular: RRR, S1/S2 +, no rubs, no gallops Respiratory: CTA bilaterally, no wheezing, no rhonchi Abdominal: Soft,  NT, ND, bowel sounds + Extremities: no edema, no cyanosis    The results of significant diagnostics from this hospitalization (including imaging, microbiology, ancillary and laboratory) are listed below for reference.     Microbiology: Recent Results (from the past 240 hour(s))  MRSA PCR Screening     Status: None   Collection Time: 03/30/17  5:13 AM  Result Value Ref Range Status   MRSA by PCR NEGATIVE NEGATIVE Final    Comment:        The GeneXpert MRSA Assay (FDA approved for NASAL specimens only), is one component of a comprehensive MRSA colonization surveillance program. It is not intended to diagnose MRSA infection nor to guide or monitor treatment for MRSA infections.      Labs: BNP (last 3 results) No results for input(s): BNP in the last 8760 hours. Basic Metabolic Panel:  Recent Labs Lab 03/30/17 0048 03/30/17 0604 03/30/17 0944 03/30/17 1652  NA 134* 137 136 133*  K 4.9 4.1 3.5 3.8  CL 93* 107 108 103  CO2 15* 18* 18* 21*  GLUCOSE 436* 162* 137* 300*  BUN 17 12 12 9   CREATININE 1.59* 1.11 0.89 0.99  CALCIUM 10.1 8.2* 8.1* 8.3*  MG 1.8  --   --   --   PHOS 4.9*  --   --   --    Liver Function Tests:  Recent Labs Lab 03/30/17 0944  AST 19  ALT 24  ALKPHOS 52  BILITOT 1.3*  PROT 5.3*  ALBUMIN 3.2*   No results for input(s): LIPASE, AMYLASE in the last 168 hours. No results for input(s): AMMONIA in the last 168 hours. CBC:  Recent Labs Lab 03/30/17 0048 03/30/17 0944  WBC 17.4* 11.6*  HGB 16.9 12.6*  HCT 50.2 37.9*  MCV 92.1 90.0  PLT 327 200   Cardiac Enzymes: No results for input(s): CKTOTAL, CKMB, CKMBINDEX, TROPONINI in the last 168 hours. BNP: Invalid input(s): POCBNP CBG:  Recent Labs Lab 03/30/17 1013 03/30/17 1121 03/30/17 1355 03/30/17 1532 03/30/17 1947  GLUCAP 134* 129* 182* 237* 271*   D-Dimer No results for input(s): DDIMER in the last 72 hours. Hgb A1c  Recent Labs  03/30/17 1224  HGBA1C 15.3*    Lipid Profile No results for input(s): CHOL, HDL, LDLCALC, TRIG, CHOLHDL, LDLDIRECT in the last 72 hours. Thyroid function studies No results for input(s): TSH, T4TOTAL, T3FREE, THYROIDAB in the last 72 hours.  Invalid input(s): FREET3 Anemia work up No results for input(s): VITAMINB12, FOLATE, FERRITIN, TIBC, IRON, RETICCTPCT in the last 72 hours. Urinalysis    Component Value Date/Time   COLORURINE STRAW (A) 03/30/2017 0440   APPEARANCEUR CLEAR 03/30/2017 0440   LABSPEC 1.026 03/30/2017 0440   PHURINE 5.0 03/30/2017 0440   GLUCOSEU >=500 (A) 03/30/2017 0440   HGBUR NEGATIVE 03/30/2017 0440   BILIRUBINUR NEGATIVE 03/30/2017 0440   KETONESUR 80 (A) 03/30/2017 0440   PROTEINUR NEGATIVE 03/30/2017 0440   UROBILINOGEN 0.2 10/04/2014 1549   NITRITE NEGATIVE 03/30/2017 0440   LEUKOCYTESUR NEGATIVE 03/30/2017 0440   Sepsis Labs Invalid input(s): PROCALCITONIN,  WBC,  LACTICIDVEN Microbiology Recent Results (from the  past 240 hour(s))  MRSA PCR Screening     Status: None   Collection Time: 03/30/17  5:13 AM  Result Value Ref Range Status   MRSA by PCR NEGATIVE NEGATIVE Final    Comment:        The GeneXpert MRSA Assay (FDA approved for NASAL specimens only), is one component of a comprehensive MRSA colonization surveillance program. It is not intended to diagnose MRSA infection nor to guide or monitor treatment for MRSA infections.      Time coordinating discharge: Over 30 minutes  SIGNED:   Drema Dallas, MD  [facility] 04/01/2017, 6:35 AM Pager   If 7PM-7AM, please contact night-coverage [email or web address] Password [email or web address]

## 2017-12-02 ENCOUNTER — Inpatient Hospital Stay (HOSPITAL_COMMUNITY)
Admission: EM | Admit: 2017-12-02 | Discharge: 2017-12-04 | DRG: 639 | Disposition: A | Discharge status: Home / Self Care | Payer: Self-pay | Attending: Internal Medicine | Admitting: Internal Medicine

## 2017-12-02 ENCOUNTER — Other Ambulatory Visit: Payer: Self-pay

## 2017-12-02 DIAGNOSIS — R112 Nausea with vomiting, unspecified: Secondary | ICD-10-CM

## 2017-12-02 DIAGNOSIS — Z599 Problem related to housing and economic circumstances, unspecified: Secondary | ICD-10-CM

## 2017-12-02 DIAGNOSIS — R110 Nausea: Secondary | ICD-10-CM

## 2017-12-02 DIAGNOSIS — Z9119 Patient's noncompliance with other medical treatment and regimen: Secondary | ICD-10-CM

## 2017-12-02 DIAGNOSIS — R000 Tachycardia, unspecified: Secondary | ICD-10-CM

## 2017-12-02 DIAGNOSIS — E872 Acidosis: Secondary | ICD-10-CM

## 2017-12-02 DIAGNOSIS — Z9114 Patient's other noncompliance with medication regimen: Secondary | ICD-10-CM

## 2017-12-02 DIAGNOSIS — E860 Dehydration: Secondary | ICD-10-CM | POA: Diagnosis present

## 2017-12-02 DIAGNOSIS — D72829 Elevated white blood cell count, unspecified: Secondary | ICD-10-CM | POA: Diagnosis present

## 2017-12-02 DIAGNOSIS — E8729 Other acidosis: Secondary | ICD-10-CM

## 2017-12-02 DIAGNOSIS — E109 Type 1 diabetes mellitus without complications: Secondary | ICD-10-CM | POA: Diagnosis present

## 2017-12-02 DIAGNOSIS — E876 Hypokalemia: Secondary | ICD-10-CM | POA: Diagnosis present

## 2017-12-02 DIAGNOSIS — E1110 Type 2 diabetes mellitus with ketoacidosis without coma: Secondary | ICD-10-CM | POA: Diagnosis present

## 2017-12-02 DIAGNOSIS — F17210 Nicotine dependence, cigarettes, uncomplicated: Secondary | ICD-10-CM | POA: Diagnosis present

## 2017-12-02 DIAGNOSIS — Z833 Family history of diabetes mellitus: Secondary | ICD-10-CM

## 2017-12-02 DIAGNOSIS — E1010 Type 1 diabetes mellitus with ketoacidosis without coma: Principal | ICD-10-CM | POA: Diagnosis present

## 2017-12-02 DIAGNOSIS — Z881 Allergy status to other antibiotic agents status: Secondary | ICD-10-CM

## 2017-12-02 LAB — BASIC METABOLIC PANEL WITH GFR
Anion gap: 20 — ABNORMAL HIGH (ref 5–15)
BUN: 21 mg/dL — ABNORMAL HIGH (ref 6–20)
CO2: 18 mmol/L — ABNORMAL LOW (ref 22–32)
Calcium: 9.5 mg/dL (ref 8.9–10.3)
Chloride: 98 mmol/L — ABNORMAL LOW (ref 101–111)
Creatinine, Ser: 1.13 mg/dL (ref 0.61–1.24)
GFR calc Af Amer: >60 mL/min
GFR calc non Af Amer: >60 mL/min
Glucose, Bld: 253 mg/dL — ABNORMAL HIGH (ref 65–99)
Potassium: 4.4 mmol/L (ref 3.5–5.1)
Sodium: 136 mmol/L (ref 135–145)

## 2017-12-02 LAB — BASIC METABOLIC PANEL
Anion gap: 16 — ABNORMAL HIGH (ref 5–15)
Anion gap: 13 (ref 5–15)
BUN: 20 mg/dL (ref 6–20)
BUN: 16 mg/dL (ref 6–20)
CALCIUM: 8.4 mg/dL — AB (ref 8.9–10.3)
CO2: 18 mmol/L — ABNORMAL LOW (ref 22–32)
CO2: 17 mmol/L — ABNORMAL LOW (ref 22–32)
CREATININE: 1.25 mg/dL — AB (ref 0.61–1.24)
CREATININE: 0.93 mg/dL (ref 0.61–1.24)
Calcium: 8.1 mg/dL — ABNORMAL LOW (ref 8.9–10.3)
Chloride: 103 mmol/L (ref 101–111)
Chloride: 107 mmol/L (ref 101–111)
GFR calc Af Amer: >60 mL/min (ref >60)
GFR calc Af Amer: >60 mL/min (ref >60)
Glucose, Bld: 286 mg/dL — ABNORMAL HIGH (ref 65–99)
Glucose, Bld: 160 mg/dL — ABNORMAL HIGH (ref 65–99)
Potassium: 4.1 mmol/L (ref 3.5–5.1)
Potassium: 4.1 mmol/L (ref 3.5–5.1)
SODIUM: 137 mmol/L (ref 135–145)
Sodium: 137 mmol/L (ref 135–145)

## 2017-12-02 LAB — CBC
HCT: 45.6 % (ref 39.0–52.0)
Hemoglobin: 15.9 g/dL (ref 13.0–17.0)
MCH: 31.1 pg (ref 26.0–34.0)
MCHC: 34.9 g/dL (ref 30.0–36.0)
MCV: 89.1 fL (ref 78.0–100.0)
Platelets: 281 K/uL (ref 150–400)
RBC: 5.12 MIL/uL (ref 4.22–5.81)
RDW: 12.2 % (ref 11.5–15.5)
WBC: 12.3 K/uL — ABNORMAL HIGH (ref 4.0–10.5)

## 2017-12-02 LAB — URINALYSIS, ROUTINE W REFLEX MICROSCOPIC
Bilirubin Urine: NEGATIVE
Glucose, UA: >=500 mg/dL — AB
Hgb urine dipstick: NEGATIVE
Ketones, ur: 80 mg/dL — AB
Leukocytes, UA: NEGATIVE
Nitrite: NEGATIVE
PH: 5 (ref 5.0–8.0)
Protein, ur: NEGATIVE mg/dL
SPECIFIC GRAVITY, URINE: 1.028 (ref 1.005–1.030)

## 2017-12-02 LAB — CBG MONITORING, ED
GLUCOSE-CAPILLARY: 211 mg/dL — AB (ref 65–99)
GLUCOSE-CAPILLARY: 228 mg/dL — AB (ref 65–99)
Glucose-Capillary: 325 mg/dL — ABNORMAL HIGH (ref 65–99)
Glucose-Capillary: 360 mg/dL — ABNORMAL HIGH (ref 65–99)
Glucose-Capillary: 326 mg/dL — ABNORMAL HIGH (ref 65–99)
Glucose-Capillary: 197 mg/dL — ABNORMAL HIGH (ref 65–99)
Glucose-Capillary: 138 mg/dL — ABNORMAL HIGH (ref 65–99)
Glucose-Capillary: 280 mg/dL — ABNORMAL HIGH (ref 65–99)
Glucose-Capillary: 288 mg/dL — ABNORMAL HIGH (ref 65–99)

## 2017-12-02 LAB — I-STAT VENOUS BLOOD GAS, ED
Acid-base deficit: 7 mmol/L — ABNORMAL HIGH (ref 0.0–2.0)
BICARBONATE: 17.7 mmol/L — AB (ref 20.0–28.0)
O2 Saturation: 93 %
PCO2 VEN: 32 mmHg — AB (ref 44.0–60.0)
TCO2: 19 mmol/L — AB (ref 22–32)
pH, Ven: 7.35 (ref 7.250–7.430)
pO2, Ven: 71 mmHg — ABNORMAL HIGH (ref 32.0–45.0)

## 2017-12-02 LAB — BETA-HYDROXYBUTYRIC ACID: Beta-Hydroxybutyric Acid: 6.97 mmol/L — ABNORMAL HIGH (ref 0.05–0.27)

## 2017-12-02 LAB — MAGNESIUM: MAGNESIUM: 1.5 mg/dL — AB (ref 1.7–2.4)

## 2017-12-02 LAB — RAPID URINE DRUG SCREEN, HOSP PERFORMED
AMPHETAMINES: NOT DETECTED
BARBITURATES: NOT DETECTED
BENZODIAZEPINES: NOT DETECTED
Cocaine: NOT DETECTED
Opiates: NOT DETECTED
TETRAHYDROCANNABINOL: POSITIVE — AB

## 2017-12-02 LAB — HEMOGLOBIN A1C
Hgb A1c MFr Bld: 11.3 % — ABNORMAL HIGH (ref 4.8–5.6)
MEAN PLASMA GLUCOSE: 277.61 mg/dL

## 2017-12-02 LAB — PHOSPHORUS: Phosphorus: 2.9 mg/dL (ref 2.5–4.6)

## 2017-12-02 MED ORDER — SODIUM CHLORIDE 0.9 % IV BOLUS (SEPSIS)
1000.0000 mL | Freq: Once | INTRAVENOUS | Status: AC
  Administered 2017-12-02: 1000 mL via INTRAVENOUS

## 2017-12-02 MED ORDER — DEXTROSE-NACL 5-0.45 % IV SOLN: INTRAVENOUS | Status: DC

## 2017-12-02 MED ORDER — POTASSIUM CHLORIDE 10 MEQ/100ML IV SOLN
10.0000 meq | INTRAVENOUS | Status: AC
  Administered 2017-12-02: 10 meq via INTRAVENOUS
  Filled 2017-12-02: qty 100

## 2017-12-02 MED ORDER — SODIUM CHLORIDE 0.9 % IV SOLN
INTRAVENOUS | Status: DC
  Administered 2017-12-02: 3 [IU]/h via INTRAVENOUS
  Filled 2017-12-02: qty 1

## 2017-12-02 MED ORDER — DEXTROSE 50 % IV SOLN: 25.0000 mL | INTRAVENOUS | Status: DC | PRN

## 2017-12-02 MED ORDER — ONDANSETRON HCL 4 MG/2ML IJ SOLN
INTRAMUSCULAR | Status: AC
  Filled 2017-12-02: qty 2

## 2017-12-02 MED ORDER — HEPARIN SODIUM (PORCINE) 5000 UNIT/ML IJ SOLN
5000.0000 [IU] | Freq: Three times a day (TID) | INTRAMUSCULAR | Status: DC
  Administered 2017-12-03 – 2017-12-04 (×4): 5000 [IU] via SUBCUTANEOUS
  Filled 2017-12-02 (×4): qty 1

## 2017-12-02 MED ORDER — DEXTROSE-NACL 5-0.45 % IV SOLN
INTRAVENOUS | Status: DC
  Administered 2017-12-02: 20:00:00 via INTRAVENOUS

## 2017-12-02 MED ORDER — ONDANSETRON HCL 4 MG/2ML IJ SOLN
4.0000 mg | Freq: Once | INTRAMUSCULAR | Status: AC
  Administered 2017-12-02: 4 mg via INTRAVENOUS

## 2017-12-02 MED ORDER — SODIUM CHLORIDE 0.9 % IV SOLN
INTRAVENOUS | Status: DC
  Administered 2017-12-02 (×2): via INTRAVENOUS

## 2017-12-02 MED ORDER — PROMETHAZINE HCL 25 MG/ML IJ SOLN
12.5000 mg | Freq: Once | INTRAMUSCULAR | Status: AC
  Administered 2017-12-02: 12.5 mg via INTRAVENOUS
  Filled 2017-12-02: qty 1

## 2017-12-02 MED ORDER — ONDANSETRON HCL 4 MG/2ML IJ SOLN: 4.0000 mg | Freq: Four times a day (QID) | INTRAMUSCULAR | Status: DC | PRN

## 2017-12-02 MED ORDER — POTASSIUM CHLORIDE 10 MEQ/100ML IV SOLN
10.0000 meq | INTRAVENOUS | Status: AC
  Administered 2017-12-02 – 2017-12-03 (×3): 10 meq via INTRAVENOUS
  Filled 2017-12-02 (×3): qty 100

## 2017-12-02 MED ORDER — SODIUM CHLORIDE 0.9 % IV SOLN: INTRAVENOUS | Status: DC

## 2017-12-02 MED ORDER — METOCLOPRAMIDE HCL 5 MG/ML IJ SOLN
10.0000 mg | Freq: Four times a day (QID) | INTRAMUSCULAR | Status: DC
  Administered 2017-12-02 – 2017-12-04 (×5): 10 mg via INTRAVENOUS
  Filled 2017-12-02 (×6): qty 2

## 2017-12-02 MED ORDER — PROMETHAZINE HCL 25 MG/ML IJ SOLN: 12.5000 mg | Freq: Three times a day (TID) | INTRAMUSCULAR | Status: DC | PRN

## 2017-12-02 MED ORDER — SODIUM CHLORIDE 0.9 % IV SOLN
INTRAVENOUS | Status: AC
  Administered 2017-12-02: 18:00:00 via INTRAVENOUS

## 2017-12-02 MED ORDER — INSULIN REGULAR BOLUS VIA INFUSION
0.0000 [IU] | Freq: Three times a day (TID) | INTRAVENOUS | Status: DC
  Filled 2017-12-02: qty 10

## 2017-12-02 NOTE — ED Notes (Signed)
Pt noted to be tachycardic; reports chest pain and nausea that started after pain in his chest. Admitting MD paged

## 2017-12-02 NOTE — ED Triage Notes (Addendum)
Onset 2 days vomiting. Pt states had high blood glucose this am in 400s. Reports taking about 8 units of insulin at 10:30 am, prior to that took between 4 and 6 units of insulin.

## 2017-12-02 NOTE — ED Provider Notes (Addendum)
[facility] Provider Note   CSN: [id number] Arrival date & time: 12/02/17  1230     History   Chief Complaint No chief complaint on file.   HPI Zachary Vazquez is a 32 y.o. male.  HPI   32 year old male with type 1 diabetes.  Patient reports with 3-4 days of vomiting.  Patient's been unable to keep his sugars under control.  He has no insurance has been unable to afford insulin.  He reports he is been using insulin of other people on the streets.  Patient reports he has been vomiting no diarrhea no abdominal pain . He knew that he was can have to be hospitalized but he took 8 units of HummaLog this morning to try to prevent it.  Past Medical History:  Diagnosis Date  . Type 1 diabetes mellitus [facility](HCC)     Patient Active Problem List   Diagnosis Date Noted  . DKA (diabetic ketoacidosis) (HCC) 03/30/2017  . Tobacco use disorder 03/30/2017  . Type 1 diabetes mellitus (HCC) 03/30/2017  . Leukocytosis 03/30/2017  . Abnormal EKG 03/30/2017  . AKI (acute kidney injury) (HCC) 03/30/2017  . DKA, type 1 (HCC) 07/01/2016  . DKA (diabetic ketoacidoses) (HCC) 10/04/2014    Past Surgical History:  Procedure Laterality Date  . TONSILLECTOMY         Home Medications    Prior to Admission medications   Medication Sig Start Date End Date Taking? Authorizing Provider  insulin NPH-regular Human (NOVOLIN 70/30) (70-30) 100 UNIT/ML injection Inject 20 Units into the skin 2 (two) times daily with a meal. 10/08/16   Alford HighlandWieting, Richard, MD    Family History Family History  Problem Relation Age of Onset  . Hypertension Mother   . Diabetes Mellitus I Brother     Social History Social History   Tobacco Use  . Smoking status: Current Every Day Smoker    Packs/day: 1.00    Years: 15.00    Pack years: 15.00    Types: Cigarettes  . Smokeless tobacco: Never Used  Substance Use Topics  . Alcohol use: No  . Drug use: Yes    Types: Marijuana   Comment: occ     Allergies   Clindamycin/lincomycin   Review of Systems Review of Systems  Constitutional: Positive for fatigue. Negative for activity change and fever.  Respiratory: Negative for shortness of breath.   Cardiovascular: Negative for chest pain.  Gastrointestinal: Positive for nausea and vomiting. Negative for abdominal pain.  Endocrine: Positive for polydipsia.  All other systems reviewed and are negative.    Physical Exam Updated Vital Signs BP (!) 150/86 (BP Location: Right Arm)   Pulse (!) 111   Temp 98.8 F (37.1 C) (Oral)   Resp 16   SpO2 98%   Physical Exam  Constitutional: He is oriented to person, place, and time. He appears well-nourished.  Dry mucous membranes.  Actively vomiting.  HENT:  Head: Normocephalic.  Eyes: Conjunctivae are normal.  Cardiovascular: Regular rhythm.  Tachycardia.  Pulmonary/Chest: Breath sounds normal. No respiratory distress.  tachypnic  Abdominal: Soft. He exhibits no distension.  Neurological: He is oriented to person, place, and time.  Skin: Skin is warm and dry. He is not diaphoretic.  Psychiatric: He has a normal mood and affect. His behavior is normal.     ED Treatments / Results  Labs (all labs ordered are listed, but only abnormal results are displayed) Labs Reviewed  BASIC METABOLIC PANEL - Abnormal; Notable for the  following components:      Result Value   Chloride 98 (*)    CO2 18 (*)    Glucose, Bld 253 (*)    BUN 21 (*)    Anion gap 20 (*)    All other components within normal limits  CBC - Abnormal; Notable for the following components:   WBC 12.3 (*)    All other components within normal limits  CBG MONITORING, ED - Abnormal; Notable for the following components:   Glucose-Capillary 228 (*)    All other components within normal limits  CBG MONITORING, ED - Abnormal; Notable for the following components:   Glucose-Capillary 325 (*)    All other components within normal limits  URINALYSIS,  ROUTINE W REFLEX MICROSCOPIC  I-STAT VENOUS BLOOD GAS, ED    EKG  EKG Interpretation None       Radiology No results found.  Procedures Procedures (including critical care time)  CRITICAL CARE Performed by: Arlana Hove Total critical care time: 45 minutes Critical care time was exclusive of separately billable procedures and treating other patients. Critical care was necessary to treat or prevent imminent or life-threatening deterioration. Critical care was time spent personally by me on the following activities: development of treatment plan with patient and/or surrogate as well as nursing, discussions with consultants, evaluation of patient's response to treatment, examination of patient, obtaining history from patient or surrogate, ordering and performing treatments and interventions, ordering and review of laboratory studies, ordering and review of radiographic studies, pulse oximetry and re-evaluation of patient's condition.   Medications Ordered in ED Medications  dextrose 5 %-0.45 % sodium chloride infusion (not administered)  insulin regular bolus via infusion 0-10 Units (not administered)  insulin regular (NOVOLIN R,HUMULIN R) 100 Units in sodium chloride 0.9 % 100 mL (1 Units/mL) infusion (not administered)  dextrose 50 % solution 25 mL (not administered)  0.9 %  sodium chloride infusion (not administered)  sodium chloride 0.9 % bolus 1,000 mL (not administered)     Initial Impression / Assessment and Plan / ED Course  I have reviewed the triage vital signs and the nursing notes.  Pertinent labs & imaging results that were available during my care of the patient were reviewed by me and considered in my medical decision making (see chart for details).     32 year old male with type 1 diabetes.  Patient reports with 3-4 days of vomiting.  Patient's been unable to keep his sugars under control.  He has no insurance has been unable to afford insulin.  He  reports he is been using insulin of other people on the streets.  Patient reports he has been vomiting no diarrhea no abdominal pain . He knew that he was can have to be hospitalized but he took 8 units of HummaLog this morning to try to prevent it  3:35 PM All the patient's sugars are only 320, he has a anion gap of 20.  Will require insulin drip.  Will admit.  Final Clinical Impressions(s) / ED Diagnoses   Final diagnoses:  None    ED Discharge Orders    None       Abelino Derrick, MD 12/02/17 1536    Abelino Derrick, MD 12/02/17 1537

## 2017-12-02 NOTE — ED Triage Notes (Signed)
Blood glucose now 228.

## 2017-12-02 NOTE — H&P (Signed)
History and Physical    Zachary Vazquez ZOX:[id number] DOB: 01/26/1986 DOA: 12/02/2017  PCP: Patient, No Pcp Per   Patient coming from: Home  Chief Complaint: Nausea, Vomiting, Feeling like going into DKA  HPI: Zachary Vazquez is a 32 y.o. male with medical history significant of Type 1 Diabetes Mellitus Type 2, Hx of Multiple episodes of DKA,  And other comorbids who presented to the ED with Nausea and Vomiting since Saturday. Patient states he does not have a PCP and has not taken his Lantus Insulin in "quite a while." States he gets Insulin from his friends and took 12 units of Humalog this AM. He states he has not been around any body that has been sick. He does not have a pharmacy but got his Insulin previously at [facility]. He denied any CP, SOB, Diarrhea, Abdominal Pain or any urinary symptoms and states he "just can't keep anything down." [facility] was called to admit for DKA.  ED Course: He was given 1 Liter of NS, had a VBG and basic blood work done, and was started on Insulin gtt. Was also given IV Zofran for his Nausea and Vomiting. I asked EDP to give another liter bolus and obtain a UDS.    Review of Systems: As per HPI otherwise 10 point review of systems negative. No lightheadedness or dizziness.   Past Medical History:  Diagnosis Date  . Type 1 diabetes mellitus (HCC)    Past Surgical History:  Procedure Laterality Date  . TONSILLECTOMY     SOCIAL HISTORY  reports that he has been smoking cigarettes.  He has a 15.00 pack-year smoking history. he has never used smokeless tobacco. He reports that he uses drugs. Drug: Marijuana. He reports that he does not drink alcohol.  Allergies  Allergen Reactions  . Clindamycin/Lincomycin Anaphylaxis   Family History  Problem Relation Age of Onset  . Hypertension Mother   . Diabetes Mellitus I Brother    Prior to Admission medications   Medication Sig Start Date End Date Taking? Authorizing Provider  insulin NPH-regular Human (NOVOLIN  70/30) (70-30) 100 UNIT/ML injection Inject 20 Units into the skin 2 (two) times daily with a meal. 10/08/16  Yes Alford Highland, MD   Physical Exam: Vitals:   12/02/17 1338 12/02/17 1518  BP: 114/83 (!) 150/86  Pulse: (!) 128 (!) 111  Resp:  16  Temp: 98.8 F (37.1 C)   TempSrc: Oral   SpO2: 98% 98%   Constitutional: WN/WD Caucasian male who is actively nauseous and vomiting  Eyes: Lids and conjunctivae normal, sclerae anicteric  ENMT: External Ears, Nose appear normal. Grossly normal hearing. Mucous membranes are dry.   Neck: Appears normal, supple, no cervical masses, normal ROM, no appreciable thyromegaly, no JVD Respiratory: Diminished to auscultation bilaterally, no wheezing, rales, rhonchi or crackles. Normal respiratory effort and patient is not tachypenic. No accessory muscle use.  Cardiovascular: Tachycardic Rate and Regular Rhythm, no murmurs / rubs / gallops. S1 and S2 auscultated. No extremity edema.  Abdomen: Soft, non-tender, non-distended. No masses palpated. No appreciable hepatosplenomegaly. Bowel sounds positive x4.  GU: Deferred. Musculoskeletal: No clubbing / cyanosis of digits/nails. No joint deformity upper and lower extremities. .  Skin: No rashes, lesions, ulcers on a limited skin evaluation. No induration; Warm and dry.  Neurologic: CN 2-12 grossly intact with no focal deficits. Romberg sign cerebellar reflexes not assessed.  Psychiatric: Normal judgment and insight. Alert and oriented x 3. Normal mood and appropriate affect.   Labs on Admission: I  have personally reviewed following labs and imaging studies  CBC: Recent Labs  Lab 12/02/17 1344  WBC 12.3*  HGB 15.9  HCT 45.6  MCV 89.1  PLT 281   Basic Metabolic Panel: Recent Labs  Lab 12/02/17 1344  NA 136  K 4.4  CL 98*  CO2 18*  GLUCOSE 253*  BUN 21*  CREATININE 1.13  CALCIUM 9.5   GFR: CrCl cannot be calculated (Unknown ideal weight.). Liver Function Tests: No results for input(s):  AST, ALT, ALKPHOS, BILITOT, PROT, ALBUMIN in the last 168 hours. No results for input(s): LIPASE, AMYLASE in the last 168 hours. No results for input(s): AMMONIA in the last 168 hours. Coagulation Profile: No results for input(s): INR, PROTIME in the last 168 hours. Cardiac Enzymes: No results for input(s): CKTOTAL, CKMB, CKMBINDEX, TROPONINI in the last 168 hours. BNP (last 3 results) No results for input(s): PROBNP in the last 8760 hours. HbA1C: No results for input(s): HGBA1C in the last 72 hours. CBG: Recent Labs  Lab 12/02/17 1334 12/02/17 1507 12/02/17 1553 12/02/17 1658  GLUCAP 228* 325* 360* 326*   Lipid Profile: No results for input(s): CHOL, HDL, LDLCALC, TRIG, CHOLHDL, LDLDIRECT in the last 72 hours. Thyroid Function Tests: No results for input(s): TSH, T4TOTAL, FREET4, T3FREE, THYROIDAB in the last 72 hours. Anemia Panel: No results for input(s): VITAMINB12, FOLATE, FERRITIN, TIBC, IRON, RETICCTPCT in the last 72 hours. Urine analysis:    Component Value Date/Time   COLORURINE STRAW (A) 03/30/2017 0440   APPEARANCEUR CLEAR 03/30/2017 0440   LABSPEC 1.026 03/30/2017 0440   PHURINE 5.0 03/30/2017 0440   GLUCOSEU >=500 (A) 03/30/2017 0440   HGBUR NEGATIVE 03/30/2017 0440   BILIRUBINUR NEGATIVE 03/30/2017 0440   KETONESUR 80 (A) 03/30/2017 0440   PROTEINUR NEGATIVE 03/30/2017 0440   UROBILINOGEN 0.2 10/04/2014 1549   NITRITE NEGATIVE 03/30/2017 0440   LEUKOCYTESUR NEGATIVE 03/30/2017 0440   Sepsis Labs: !!!!!!!!!!!!!!!!!!!!!!!!!!!!!!!!!!!!!!!!!!!! @LABRCNTIP (procalcitonin:4,lacticidven:4) )No results found for this or any previous visit (from the past 240 hour(s)).   Radiological Exams on Admission: No results found.  EKG: Not done on Admission so have ordered one.  Assessment/Plan Active Problems:   DKA (diabetic ketoacidoses) (HCC)   DKA, type 1 (HCC)   Type 1 diabetes mellitus (HCC)   Leukocytosis   Increased anion gap metabolic acidosis   Nausea  and vomiting   Sinus tachycardia  DKA in the setting of Diabetes Mellitus Type 1 2/2 to Non-compliance of Insulin due to prohibitive Cost -Place in SDU Obs -NPO as patient is actively vomiting -VBG showed 7.350/ -Bolused 1 Liter in ED; Asked EDP to provide additional Bolus and I ordered another one -DKA Glucostabilizer Protocol with Insulin gtt -IVF with NS at 100 mL after boluses -Changed IVF to D5W 1/2 NS when Sugars are <250 -BMP's q4h to check CO2 -Transition of Insulin gtt to long acting insulin when CO2 is >20 x2 -q1h CBG's; CBG's ranging from 228-360 -DKA Phase 1 Protocol; Transition to Phase 2 when Appropriate  -Check UDS   Anion Gap Metabolic Acidosis 2/2 to DKA -AG was 20; CO2 was 18 -See Above  Nausea and Vomiting -C/w IVF Rehydration -NPO at this time -C/w Antiemetics with Zofran and Promethazine PRN  Leukocytosis -WBC was 12.3 -? Hemoconcentration from Nausea/Vomiting/Dehydraiton -Check Blood Cx x2 and Urinalysis  -Afebrile. No S/Sx of Infection -Repeat CBC in AM  Sinus Tachycardia -Likely from Dehydration  -C/w IVF Rehydration -EKG not done on admission so will order  DVT prophylaxis: Heparin 5,000 units sq  q8h Code Status: FULL CODE Family Communication: No family present at bedside Disposition Plan: Anticipate D/C Home Consults called: None Admission status: Telemetry Obs  Severity of Illness: The appropriate patient status for this patient is OBSERVATION. Observation status is judged to be reasonable and necessary in order to provide the required intensity of service to ensure the patient's safety. The patient's presenting symptoms, physical exam findings, and initial radiographic and laboratory data in the context of their medical condition is felt to place them at decreased risk for further clinical deterioration. Furthermore, it is anticipated that the patient will be medically stable for discharge from the hospital within 2 midnights of admission.  The following factors support the patient status of observation.   " The patient's presenting symptoms include Nausea, Vomiting, Dehydration. " The physical exam findings include Dry mucous membranes. " The initial radiographic and laboratory data are slightly elevated.  Merlene Laughter, D.O. [facility] Pager [phone]  If 7PM-7AM, please contact night-coverage [email or web address] Password [facility]  12/02/2017, 5:31 PM

## 2017-12-03 ENCOUNTER — Other Ambulatory Visit: Payer: Self-pay

## 2017-12-03 ENCOUNTER — Encounter (HOSPITAL_COMMUNITY): Payer: Self-pay | Admitting: General Practice

## 2017-12-03 DIAGNOSIS — G43A0 Cyclical vomiting, not intractable: Secondary | ICD-10-CM

## 2017-12-03 DIAGNOSIS — Z9114 Patient's other noncompliance with medication regimen: Secondary | ICD-10-CM

## 2017-12-03 LAB — GLUCOSE, CAPILLARY
GLUCOSE-CAPILLARY: 122 mg/dL — AB (ref 65–99)
GLUCOSE-CAPILLARY: 131 mg/dL — AB (ref 65–99)
GLUCOSE-CAPILLARY: 134 mg/dL — AB (ref 65–99)
GLUCOSE-CAPILLARY: 139 mg/dL — AB (ref 65–99)
GLUCOSE-CAPILLARY: 168 mg/dL — AB (ref 65–99)
GLUCOSE-CAPILLARY: 176 mg/dL — AB (ref 65–99)
GLUCOSE-CAPILLARY: 184 mg/dL — AB (ref 65–99)
GLUCOSE-CAPILLARY: 236 mg/dL — AB (ref 65–99)
Glucose-Capillary: 243 mg/dL — ABNORMAL HIGH (ref 65–99)
Glucose-Capillary: 210 mg/dL — ABNORMAL HIGH (ref 65–99)
Glucose-Capillary: 175 mg/dL — ABNORMAL HIGH (ref 65–99)
Glucose-Capillary: 224 mg/dL — ABNORMAL HIGH (ref 65–99)
Glucose-Capillary: 154 mg/dL — ABNORMAL HIGH (ref 65–99)
Glucose-Capillary: 209 mg/dL — ABNORMAL HIGH (ref 65–99)
Glucose-Capillary: 151 mg/dL — ABNORMAL HIGH (ref 65–99)

## 2017-12-03 LAB — BASIC METABOLIC PANEL
ANION GAP: 13 (ref 5–15)
Anion gap: 10 (ref 5–15)
Anion gap: 14 (ref 5–15)
Anion gap: 8 (ref 5–15)
Anion gap: 14 (ref 5–15)
BUN: 10 mg/dL (ref 6–20)
BUN: 12 mg/dL (ref 6–20)
BUN: 5 mg/dL — AB (ref 6–20)
BUN: 7 mg/dL (ref 6–20)
CALCIUM: 7.4 mg/dL — AB (ref 8.9–10.3)
CALCIUM: 7.7 mg/dL — AB (ref 8.9–10.3)
CALCIUM: 8.2 mg/dL — AB (ref 8.9–10.3)
CHLORIDE: 105 mmol/L (ref 101–111)
CHLORIDE: 108 mmol/L (ref 101–111)
CHLORIDE: 105 mmol/L (ref 101–111)
CO2: 14 mmol/L — AB (ref 22–32)
CO2: 16 mmol/L — AB (ref 22–32)
CO2: 17 mmol/L — AB (ref 22–32)
CO2: 18 mmol/L — AB (ref 22–32)
CO2: 19 mmol/L — AB (ref 22–32)
CREATININE: 0.78 mg/dL (ref 0.61–1.24)
CREATININE: 0.79 mg/dL (ref 0.61–1.24)
CREATININE: 0.91 mg/dL (ref 0.61–1.24)
Calcium: 8.1 mg/dL — ABNORMAL LOW (ref 8.9–10.3)
Calcium: 8.2 mg/dL — ABNORMAL LOW (ref 8.9–10.3)
Chloride: 109 mmol/L (ref 101–111)
Chloride: 109 mmol/L (ref 101–111)
Creatinine, Ser: 0.84 mg/dL (ref 0.61–1.24)
Creatinine, Ser: 0.69 mg/dL (ref 0.61–1.24)
GFR calc Af Amer: >60 mL/min (ref >60)
GFR calc Af Amer: >60 mL/min (ref >60)
GFR calc Af Amer: >60 mL/min (ref >60)
GFR calc Af Amer: >60 mL/min (ref >60)
GFR calc non Af Amer: >60 mL/min (ref >60)
GFR calc non Af Amer: >60 mL/min (ref >60)
GFR calc non Af Amer: >60 mL/min (ref >60)
GLUCOSE: 166 mg/dL — AB (ref 65–99)
GLUCOSE: 191 mg/dL — AB (ref 65–99)
Glucose, Bld: 285 mg/dL — ABNORMAL HIGH (ref 65–99)
Glucose, Bld: 159 mg/dL — ABNORMAL HIGH (ref 65–99)
Glucose, Bld: 221 mg/dL — ABNORMAL HIGH (ref 65–99)
POTASSIUM: 3.6 mmol/L (ref 3.5–5.1)
POTASSIUM: 3.4 mmol/L — AB (ref 3.5–5.1)
Potassium: 4.1 mmol/L (ref 3.5–5.1)
Potassium: 3.8 mmol/L (ref 3.5–5.1)
Potassium: 3.5 mmol/L (ref 3.5–5.1)
SODIUM: 135 mmol/L (ref 135–145)
SODIUM: 135 mmol/L (ref 135–145)
SODIUM: 137 mmol/L (ref 135–145)
Sodium: 136 mmol/L (ref 135–145)
Sodium: 136 mmol/L (ref 135–145)

## 2017-12-03 LAB — CBG MONITORING, ED
GLUCOSE-CAPILLARY: 139 mg/dL — AB (ref 65–99)
GLUCOSE-CAPILLARY: 153 mg/dL — AB (ref 65–99)
GLUCOSE-CAPILLARY: 156 mg/dL — AB (ref 65–99)
GLUCOSE-CAPILLARY: 168 mg/dL — AB (ref 65–99)
Glucose-Capillary: 163 mg/dL — ABNORMAL HIGH (ref 65–99)
Glucose-Capillary: 172 mg/dL — ABNORMAL HIGH (ref 65–99)
Glucose-Capillary: 154 mg/dL — ABNORMAL HIGH (ref 65–99)

## 2017-12-03 LAB — MRSA PCR SCREENING: MRSA BY PCR: NEGATIVE

## 2017-12-03 MED ORDER — POTASSIUM CHLORIDE CRYS ER 20 MEQ PO TBCR
40.0000 meq | EXTENDED_RELEASE_TABLET | Freq: Once | ORAL | Status: AC
  Administered 2017-12-03: 40 meq via ORAL
  Filled 2017-12-03: qty 2

## 2017-12-03 MED ORDER — SODIUM CHLORIDE 0.9 % IV BOLUS (SEPSIS)
1000.0000 mL | Freq: Once | INTRAVENOUS | Status: AC
  Administered 2017-12-03: 1000 mL via INTRAVENOUS

## 2017-12-03 NOTE — ED Notes (Signed)
Pt CBG was 154, notified Jessica(RN)

## 2017-12-03 NOTE — Progress Notes (Signed)
PROGRESS NOTE  Jahred Tatar MWU:[id number] DOB: [birth year] DOA: 12/02/2017 PCP: Patient, No Pcp Per  HPI/Recap of past 24 hours: Vaden Becherer is a 32 y.o. male with medical history significant of Type 1 Diabetes Mellitus, Multiple episodes of DKA, who presented to the ED with Nausea and Vomiting of 3 days duration. Admits to running out of his insulin. 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 Admitted for DKA.  12/03/17: Patient seen and examined at his bedside. He reports he feels better. Denies vomiting or abdominal pain. Admits to polyuria.   Assessment/Plan: Active Problems:   DKA (diabetic ketoacidoses) (HCC)   DKA, type 1 (HCC)   Type 1 diabetes mellitus (HCC)   Leukocytosis   Increased anion gap metabolic acidosis   Nausea and vomiting   Sinus tachycardia  DKA 2/2 to insulin non compliance -type 1 diabetes -A1C 11.3 (12/02/17) -Report she needs help filing for insurance to afford his insulin -Case manager to assist in medication needs -continue DKA protocol -IV fluid NS -BMP q4H  Anion gap metabolic acidosis 2/2 to DKA -management as stated above  Hypokalemia -K+ 3.4 -replete as indicated -repeat BMP am  Leukocytosis, most likely reactive -wbc 12.3 -no sign of active infection -CBC am  Sinus tachycardia, resolved  Medical noncompliance 2/2 financial constraint -case manager to assist in medication/insurance needs    Code Status: full  Family Communication: none at bedside  Disposition Plan: Home when stable    Consultants:  none  Procedures:  none  Antimicrobials:  none  DVT prophylaxis:  sq 5000 u tid   Objective: Vitals:   12/03/17 0630 12/03/17 0700 12/03/17 0715 12/03/17 0730  BP: 105/63 122/84  108/64  Pulse: (!) 103  95 100  Resp: 19  17 19   Temp:      TempSrc:      SpO2: 98%  98% 98%    Intake/Output Summary (Last 24 hours) at 12/03/2017 1011 Last data filed at 12/03/2017 1000 Gross per 24 hour  Intake 6250 ml  Output 2050  ml  Net 4200 ml   There were no vitals filed for this visit.  Exam:   General:  32 yo CM NAD A&O  x3   Cardiovascular: RRR no rubs or gallops  Respiratory: CTA no wheezes or rales  Abdomen: soft NT ND NBS x4   Musculoskeletal: non focal  Skin: trace edema in LE bilaterally  Psychiatry: Mood is appropriate for condition and setting   Data Reviewed: CBC: Recent Labs  Lab 12/02/17 1344  WBC 12.3*  HGB 15.9  HCT 45.6  MCV 89.1  PLT 281   Basic Metabolic Panel: Recent Labs  Lab 12/02/17 1731 12/02/17 2038 12/03/17 0041 12/03/17 0333 12/03/17 0848  NA 137 137 136 136 135  K 4.1 4.1 4.1 3.8 3.6  CL 103 107 109 109 105  CO2 18* 17* 17* 14* 16*  GLUCOSE 286* 160* 191* 166* 285*  BUN 20 16 12 10 7   CREATININE 1.25* 0.93 0.91 0.79 0.84  CALCIUM 8.4* 8.1* 7.7* 7.4* 8.1*  MG 1.5*  --   --   --   --   PHOS 2.9  --   --   --   --    GFR: CrCl cannot be calculated (Unknown ideal weight.). Liver Function Tests: No results for input(s): AST, ALT, ALKPHOS, BILITOT, PROT, ALBUMIN in the last 168 hours. No results for input(s): LIPASE, AMYLASE in the last 168 hours. No results for input(s): AMMONIA in the last 168 hours.  Coagulation Profile: No results for input(s): INR, PROTIME in the last 168 hours. Cardiac Enzymes: No results for input(s): CKTOTAL, CKMB, CKMBINDEX, TROPONINI in the last 168 hours. BNP (last 3 results) No results for input(s): PROBNP in the last 8760 hours. HbA1C: Recent Labs    12/02/17 1731  HGBA1C 11.3*   CBG: Recent Labs  Lab 12/03/17 0520 12/03/17 0623 12/03/17 0728 12/03/17 0835 12/03/17 0957  GLUCAP 163* 172* 154* 236* 243*   Lipid Profile: No results for input(s): CHOL, HDL, LDLCALC, TRIG, CHOLHDL, LDLDIRECT in the last 72 hours. Thyroid Function Tests: No results for input(s): TSH, T4TOTAL, FREET4, T3FREE, THYROIDAB in the last 72 hours. Anemia Panel: No results for input(s): VITAMINB12, FOLATE, FERRITIN, TIBC, IRON,  RETICCTPCT in the last 72 hours. Urine analysis:    Component Value Date/Time   COLORURINE YELLOW 12/02/2017 1731   APPEARANCEUR CLEAR 12/02/2017 1731   LABSPEC 1.028 12/02/2017 1731   PHURINE 5.0 12/02/2017 1731   GLUCOSEU >=500 (A) 12/02/2017 1731   HGBUR NEGATIVE 12/02/2017 1731   BILIRUBINUR NEGATIVE 12/02/2017 1731   KETONESUR 80 (A) 12/02/2017 1731   PROTEINUR NEGATIVE 12/02/2017 1731   UROBILINOGEN 0.2 10/04/2014 1549   NITRITE NEGATIVE 12/02/2017 1731   LEUKOCYTESUR NEGATIVE 12/02/2017 1731   Sepsis Labs: @LABRCNTIP (procalcitonin:4,lacticidven:4)  )No results found for this or any previous visit (from the past 240 hour(s)).    Studies: No results found.  Scheduled Meds: . heparin  5,000 Units Subcutaneous Q8H  . metoCLOPramide (REGLAN) injection  10 mg Intravenous Q6H    Continuous Infusions: . sodium chloride Stopped (12/03/17 0758)  . dextrose 5 % and 0.45% NaCl 125 mL/hr at 12/03/17 1000  . insulin (NOVOLIN-R) infusion 0.9 Units/hr (12/03/17 0729)     LOS: 0 days     Darlin Droparole N Hall, MD [facility] Pager [phone]  If 7PM-7AM, please contact night-coverage [email or web address] Password [email or web address] 12/03/2017, 10:11 AM

## 2017-12-03 NOTE — ED Notes (Signed)
Report given to 2C 

## 2017-12-03 NOTE — ED Notes (Addendum)
Checked CBG 139, informed RN Adrain and RN Gren[place]ittany

## 2017-12-03 NOTE — ED Notes (Signed)
Maintain Insulin Drip at 1unit/Hour per Stabilizer.

## 2017-12-03 NOTE — Progress Notes (Signed)
Inpatient Diabetes Program Recommendations  AACE/ADA: New Consensus Statement on Inpatient Glycemic Control (2015)  Target Ranges:  Prepandial:   less than 140 mg/dL      Peak postprandial:   less than 180 mg/dL (1-2 hours)      Critically ill patients:  140 - 180 mg/dL   Lab Results  Component Value Date   GLUCAP 210 (H) 12/03/2017   HGBA1C 11.3 (H) 12/02/2017    Review of Glycemic Control  Diabetes history: DM1 Outpatient Diabetes medications: Lantus 30-35 units qd + Humalog 2-4 units tid meal coverage Current orders for Inpatient glycemic control: IV insulin drip per glucostabilizer  Inpatient Diabetes Program Recommendations:    -When patient ready for transition, give basal insulin 2 hrs. Prior to D/C of IV insulin and cover CBG with correction @ time IV drip discontinued.   Spoke with patient @ length @ bedside. Patient has been taking insulin from his friend's insulin supply and recently changed to Levemir instead of Lantus. Reviewed with patient difference in Lantus and Levemir regarding how long the insulin lasts. 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 Reviewed with patient A1c of 11.3 (average blood glucose 278 over the past 2-3 months time). Case management consult to review if patient appropriate [facility]. Reviewed with patient to speak to his physician regularly on CBG results and insulin needs. Patient has glucometer and strips along with insulin pen needles @ home.   Thank you, Billy FischerJudy E. Dorothea Yow, RN, MSN, CDE  Diabetes Coordinator Inpatient Glycemic Control Team Team Pager [phone] (8am-5pm) 12/03/2017 11:49 AM

## 2017-12-04 DIAGNOSIS — E1010 Type 1 diabetes mellitus with ketoacidosis without coma: Principal | ICD-10-CM

## 2017-12-04 DIAGNOSIS — R112 Nausea with vomiting, unspecified: Secondary | ICD-10-CM

## 2017-12-04 DIAGNOSIS — R000 Tachycardia, unspecified: Secondary | ICD-10-CM

## 2017-12-04 DIAGNOSIS — E872 Acidosis: Secondary | ICD-10-CM

## 2017-12-04 DIAGNOSIS — D72829 Elevated white blood cell count, unspecified: Secondary | ICD-10-CM

## 2017-12-04 LAB — GLUCOSE, CAPILLARY
GLUCOSE-CAPILLARY: 114 mg/dL — AB (ref 65–99)
GLUCOSE-CAPILLARY: 143 mg/dL — AB (ref 65–99)
GLUCOSE-CAPILLARY: 170 mg/dL — AB (ref 65–99)
GLUCOSE-CAPILLARY: 184 mg/dL — AB (ref 65–99)
GLUCOSE-CAPILLARY: 244 mg/dL — AB (ref 65–99)
Glucose-Capillary: 110 mg/dL — ABNORMAL HIGH (ref 65–99)
Glucose-Capillary: 134 mg/dL — ABNORMAL HIGH (ref 65–99)

## 2017-12-04 LAB — BASIC METABOLIC PANEL
ANION GAP: 9 (ref 5–15)
Anion gap: 7 (ref 5–15)
BUN: <5 mg/dL — ABNORMAL LOW (ref 6–20)
BUN: <5 mg/dL — ABNORMAL LOW (ref 6–20)
CHLORIDE: 107 mmol/L (ref 101–111)
CHLORIDE: 107 mmol/L (ref 101–111)
CO2: 23 mmol/L (ref 22–32)
CO2: 24 mmol/L (ref 22–32)
Calcium: 8.3 mg/dL — ABNORMAL LOW (ref 8.9–10.3)
Calcium: 7.9 mg/dL — ABNORMAL LOW (ref 8.9–10.3)
Creatinine, Ser: 0.63 mg/dL (ref 0.61–1.24)
Creatinine, Ser: 0.63 mg/dL (ref 0.61–1.24)
GFR calc non Af Amer: >60 mL/min (ref >60)
Glucose, Bld: 150 mg/dL — ABNORMAL HIGH (ref 65–99)
Glucose, Bld: 129 mg/dL — ABNORMAL HIGH (ref 65–99)
POTASSIUM: 3.3 mmol/L — AB (ref 3.5–5.1)
POTASSIUM: 3 mmol/L — AB (ref 3.5–5.1)
SODIUM: 139 mmol/L (ref 135–145)
SODIUM: 138 mmol/L (ref 135–145)

## 2017-12-04 MED ORDER — NOVOLOG 100 UNIT/ML ~~LOC~~ SOLN: SUBCUTANEOUS | 0 refills | Status: DC

## 2017-12-04 MED ORDER — INSULIN ASPART 100 UNIT/ML ~~LOC~~ SOLN: 7.0000 [IU] | Freq: Three times a day (TID) | SUBCUTANEOUS | 0 refills | Status: DC

## 2017-12-04 MED ORDER — POTASSIUM CHLORIDE CRYS ER 20 MEQ PO TBCR
40.0000 meq | EXTENDED_RELEASE_TABLET | Freq: Three times a day (TID) | ORAL | Status: DC
  Administered 2017-12-04: 40 meq via ORAL
  Filled 2017-12-04: qty 2

## 2017-12-04 MED ORDER — INSULIN ASPART 100 UNIT/ML ~~LOC~~ SOLN
0.0000 [IU] | Freq: Three times a day (TID) | SUBCUTANEOUS | Status: DC
  Administered 2017-12-04: 5 [IU] via SUBCUTANEOUS
  Administered 2017-12-04: 3 [IU] via SUBCUTANEOUS

## 2017-12-04 MED ORDER — INSULIN ASPART 100 UNIT/ML ~~LOC~~ SOLN: SUBCUTANEOUS | 3 refills | Status: DC

## 2017-12-04 MED ORDER — INSULIN NPH ISOPHANE & REGULAR (70-30) 100 UNIT/ML ~~LOC~~ SUSP: 20.0000 [IU] | Freq: Two times a day (BID) | SUBCUTANEOUS | 0 refills | Status: DC

## 2017-12-04 MED ORDER — INSULIN GLARGINE 100 UNIT/ML ~~LOC~~ SOLN: 20.0000 [IU] | Freq: Every day | SUBCUTANEOUS | 11 refills | Status: DC

## 2017-12-04 MED ORDER — INSULIN GLARGINE 100 UNIT/ML ~~LOC~~ SOLN
20.0000 [IU] | Freq: Every day | SUBCUTANEOUS | Status: DC
  Administered 2017-12-04: 20 [IU] via SUBCUTANEOUS
  Filled 2017-12-04: qty 0.2

## 2017-12-04 MED ORDER — INSULIN ASPART 100 UNIT/ML ~~LOC~~ SOLN: 0.0000 [IU] | Freq: Every day | SUBCUTANEOUS | Status: DC

## 2017-12-04 NOTE — Care Management Note (Addendum)
Case Management Note  Patient Details  Name: Zachary OberBrandon Vazquez MRN: [id number] Date of Birth: 11/26/1985  Subjective/Objective:     Pt admitted with DKA - pt is a type 1 diabetic               Action/Plan: PTA independent from home.  Pt agreed for CM to [facility] - appt is on 2/25 at 9am.  [facility]us and novolog are available for fill today - pharmacy request that pt receive MATCH letter.  CM provided MATCH letter for pt and explained that he should ask for medication assistance at clinic pharmacy. [facility] - bedside nurse confirmed with attending that pt will discharge home on same meds PTA.    Expected Discharge Date:  12/06/17               Expected Discharge Plan:  Home/Self Care  In-House Referral:     Discharge planning Services  CM Consult, Indigent [facility] Clinic  Post Acute Care Choice:    Choice offered to:     DME Arranged:    DME Agency:     HH Arranged:    HH Agency:     Status of Service:  In process, will continue to follow  If discussed at Long Length of Stay Meetings, dates discussed:    Additional Comments: Attending informed by bedside nurse that clinic pharmacy closes at 5:15 and pt will need paper scripts in case he is not able to make it to the clinic pharmacy in time.  Pt states he has glucometer and all needed diabetic supplies Cherylann ParrClaxton, Charene Mccallister S, RN 12/04/2017, 2:26 PM

## 2017-12-04 NOTE — Progress Notes (Signed)
Inpatient Diabetes Program Recommendations  AACE/ADA: New Consensus Statement on Inpatient Glycemic Control (2015)  Target Ranges:  Prepandial:   less than 140 mg/dL      Peak postprandial:   less than 180 mg/dL (1-2 hours)      Critically ill patients:  140 - 180 mg/dL   Lab Results  Component Value Date   GLUCAP 184 (H) 12/04/2017   HGBA1C 11.3 (H) 12/02/2017    Review of Glycemic Control  Diabetes history: DM1 Outpatient Diabetes medications: Lantus 30-35 units qd + Humalog 2-4 units tid meal coverage Current orders for Inpatient glycemic control: Lantus 20 units qd + Novolog moderate correction tid + hs  Inpatient Diabetes Program Recommendations:   When patient Is eating: -Novolog 5 units tid meal coverage if eats 50% meal -Decrease Novolog correction to sensitive + hs  Thank you, Darel HongJudy E. Talena Neira, RN, MSN, CDE  Diabetes Coordinator Inpatient Glycemic Control Team Team Pager [phone] (8am-5pm) 12/04/2017 9:30 AM

## 2017-12-04 NOTE — Discharge Summary (Signed)
Discharge Summary  Zachary Vazquez UEA:[id number] DOB: [birth year]  PCP: Patient, No Pcp Per  Admit date: 12/02/2017 Discharge date: 12/04/2017  Time spent: 25 minutes  Recommendations for Outpatient Follow-up:  1. Follow up with PCP post hospitalization 2. Take your medications as prescribed  Discharge Diagnoses:  Active Hospital Problems   Diagnosis Date Noted  . Increased anion gap metabolic acidosis 12/02/2017  . Nausea and vomiting 12/02/2017  . Sinus tachycardia 12/02/2017  . Leukocytosis 03/30/2017  . Type 1 diabetes mellitus (HCC) 03/30/2017  . DKA, type 1 (HCC) 07/01/2016  . DKA (diabetic ketoacidoses) (HCC) 10/04/2014    Resolved Hospital Problems  No resolved problems to display.    Discharge Condition: stable  Diet recommendation: diabetic diet  Vitals:   12/04/17 0405 12/04/17 0800  BP: 109/69 132/89  Pulse: 78 85  Resp: (!) 22 18  Temp: 98.8 F (37.1 C) 98.8 F (37.1 C)  SpO2: 99% 99%    History of present illness:  Zachary Hurleyis a 31 y.o.malewith medical history significant ofType 1 Diabetes Mellitus, Multiple episodes of DKA, who presented to the ED with Nausea and Vomiting of 3 days duration. Admits to running out of his insulin. 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 Admitted for DKA.  On the day of discharge the patient was hemodynamically stable. Will need to follow up with PCP post hospitalization.   Hospital Course:  Active Problems:   DKA (diabetic ketoacidoses) (HCC)   DKA, type 1 (HCC)   Type 1 diabetes mellitus (HCC)   Leukocytosis   Increased anion gap metabolic acidosis   Nausea and vomiting   Sinus tachycardia   Procedures:  none  Consultations:  Diabetes coordinator  Discharge Exam: BP 132/89 (BP Location: Left Arm)   Pulse 85   Temp 98.8 F (37.1 C) (Oral)   Resp 18   SpO2 99%   General: 31 yo CM WD WN NAD A&O x 3 Cardiovascular: RRR no rubs or gallops Respiratory: CTA no wheezes or rales   Discharge  Instructions You were cared for by a hospitalist during your hospital stay. If you have any questions about your discharge medications or the care you received while you were in the hospital after you are discharged, you can call the unit and asked to speak with the hospitalist on call if the hospitalist that took care of you is not available. Once you are discharged, your primary care physician will handle any further medical issues. Please note that NO REFILLS for any discharge medications will be authorized once you are discharged, as it is imperative that you return to your primary care physician (or establish a relationship with a primary care physician if you do not have one) for your aftercare needs so that they can reassess your need for medications and monitor your lab values.   Allergies as of 12/04/2017      Reactions   Clindamycin/lincomycin Anaphylaxis      Medication List    TAKE these medications   insulin glargine 100 UNIT/ML injection Commonly known as:  LANTUS Inject 0.2 mLs (20 Units total) into the skin daily. Start taking on:  12/05/2017   insulin NPH-regular Human (70-30) 100 UNIT/ML injection Commonly known as:  NOVOLIN 70/30 Inject 20 Units into the skin 2 (two) times daily with a meal.   NOVOLOG 100 UNIT/ML injection Generic drug:  insulin aspart Take before meals tid   insulin aspart 100 UNIT/ML injection Commonly known as:  NOVOLOG Inject 7 Units into the skin 3 (  three) times daily with meals.      Allergies  Allergen Reactions  . Clindamycin/Lincomycin Anaphylaxis   Follow-up Information    [facility] Follow up on 12/22/2017.   Specialty:  Internal Medicine Why:  at 9.30am for Primary Care Appointment Contact information: [address] [place] [id number] [phone]       [facility] Follow up.   Why:  Please utilize this pharmacy at this address for medication fills/refills.   Request medication assitance for ongoing medication needs Contact information: [address] [place] [phone]           The results of significant diagnostics from this hospitalization (including imaging, microbiology, ancillary and laboratory) are listed below for reference.    Significant Diagnostic Studies: No results found.  Microbiology: Recent Results (from the past 240 hour(s))  Culture, blood (routine x 2)     Status: None (Preliminary result)   Collection Time: 12/02/17  6:27 PM  Result Value Ref Range Status   Specimen Description BLOOD SITE NOT SPECIFIED  Final   Special Requests   Final    BOTTLES DRAWN AEROBIC AND ANAEROBIC Blood Culture adequate volume   Culture   Final    NO GROWTH 2 DAYS Performed at [facility], [address] [place], [place] [address]    Report Status PENDING  Incomplete  MRSA PCR Screening     Status: None   Collection Time: 12/03/17  8:38 AM  Result Value Ref Range Status   MRSA by PCR NEGATIVE NEGATIVE Final    Comment:        The GeneXpert MRSA Assay (FDA approved for NASAL specimens only), is one component of a comprehensive MRSA colonization surveillance program. It is not intended to diagnose MRSA infection nor to guide or monitor treatment for MRSA infections. Performed at [facility], [address] [place], [place] [address]      Labs: Basic Metabolic Panel: Recent Labs  Lab 12/02/17 1731  12/03/17 0848 12/03/17 1226 12/03/17 1727 12/03/17 2254 12/04/17 0226  NA 137   < > 135 135 137 139 138  K 4.1   < > 3.6 3.4* 3.5 3.3* 3.0*  CL 103   < > 105 108 105 107 107  CO2 18*   < > 16* 19* 18* 23 24  GLUCOSE 286*   < > 285* 159* 221* 150* 129*  BUN 20   < > 7 5* <5* <5* <5*  CREATININE 1.25*   < > 0.84 0.69 0.78 0.63 0.63  CALCIUM 8.4*   < > 8.1* 8.2* 8.2* 8.3* 7.9*  MG 1.5*  --   --   --   --   --   --   PHOS 2.9  --   --   --   --   --   --    < > = values  in this interval not displayed.   Liver Function Tests: No results for input(s): AST, ALT, ALKPHOS, BILITOT, PROT, ALBUMIN in the last 168 hours. No results for input(s): LIPASE, AMYLASE in the last 168 hours. No results for input(s): AMMONIA in the last 168 hours. CBC: Recent Labs  Lab 12/02/17 1344  WBC 12.3*  HGB 15.9  HCT 45.6  MCV 89.1  PLT 281   Cardiac Enzymes: No results for input(s): CKTOTAL, CKMB, CKMBINDEX, TROPONINI in the last 168 hours. BNP: BNP (last 3 results)  No results for input(s): BNP in the last 8760 hours.  ProBNP (last 3 results) No results for input(s): PROBNP in the last 8760 hours.  CBG: Recent Labs  Lab 12/04/17 0314 12/04/17 0430 12/04/17 0533 12/04/17 0814 12/04/17 1235  GLUCAP 134* 143* 170* 184* 244*       Signed:  Darlin Drop, MD [facility] 12/04/2017, 7:40 PM

## 2017-12-04 NOTE — Progress Notes (Signed)
Pt discharged per MD order, all discharge instructions reviewed and all questions answered. Due to a delay in receiving discharge prescriptions pt stays he may not be able to obtain prescriptions tonight due to transportation limitation this late in the evening, educated patient on importance of not missing doses and to seek out one of the pharmacy provided on his match letter given to him by case management.

## 2017-12-07 LAB — CULTURE, BLOOD (ROUTINE X 2)
CULTURE: NO GROWTH
Special Requests: ADEQUATE

## 2017-12-22 ENCOUNTER — Encounter: Payer: Self-pay | Admitting: Family Medicine

## 2017-12-22 ENCOUNTER — Ambulatory Visit (INDEPENDENT_AMBULATORY_CARE_PROVIDER_SITE_OTHER): Payer: Self-pay | Admitting: Family Medicine

## 2017-12-22 VITALS — BP 124/80 | HR 98 | Temp 98.0°F | Ht 71.0 in | Wt 155.0 lb

## 2017-12-22 DIAGNOSIS — Z7689 Persons encountering health services in other specified circumstances: Secondary | ICD-10-CM

## 2017-12-22 DIAGNOSIS — Z1329 Encounter for screening for other suspected endocrine disorder: Secondary | ICD-10-CM

## 2017-12-22 DIAGNOSIS — R110 Nausea: Secondary | ICD-10-CM

## 2017-12-22 DIAGNOSIS — E109 Type 1 diabetes mellitus without complications: Secondary | ICD-10-CM

## 2017-12-22 LAB — POCT URINALYSIS DIP (DEVICE)
BILIRUBIN URINE: NEGATIVE
Hgb urine dipstick: NEGATIVE
KETONES UR: NEGATIVE mg/dL
LEUKOCYTES UA: NEGATIVE
Nitrite: NEGATIVE
Protein, ur: NEGATIVE mg/dL
Specific Gravity, Urine: >=1.03 (ref 1.005–1.030)
Urobilinogen, UA: 0.2 mg/dL (ref 0.0–1.0)
pH: 5.5 (ref 5.0–8.0)

## 2017-12-22 LAB — GLUCOSE, CAPILLARY: GLUCOSE-CAPILLARY: 209 mg/dL — AB (ref 65–99)

## 2017-12-22 MED ORDER — INSULIN GLARGINE 100 UNIT/ML ~~LOC~~ SOLN: 30.0000 [IU] | Freq: Every day | SUBCUTANEOUS | 11 refills | Status: DC

## 2017-12-22 MED ORDER — INSULIN ASPART 100 UNIT/ML ~~LOC~~ SOLN: SUBCUTANEOUS | 11 refills | Status: DC

## 2017-12-22 MED ORDER — ONDANSETRON HCL 4 MG/5ML PO SOLN: 4.0000 mg | Freq: Three times a day (TID) | ORAL | 3 refills | Status: DC | PRN

## 2017-12-22 NOTE — Patient Instructions (Addendum)
Your revised short-acting insulin regimen is as follows: Administer 5 units with breakfast, 7 units with lunch and dinner daily. Hold short acting insulin if blood sugar is less than 125. Continue Lantus 30 units at bedtime.  Return in 4 weeks for Diabetes management. Goal A1C is less than 11.0.

## 2017-12-22 NOTE — Progress Notes (Signed)
Patient ID: Zachary Vazquez, male    DOB: 10/09/1986, 32 y.o.   MRN: [id number]  PCP: Bing Neighbors, FNP  Chief Complaint  Patient presents with  . Establish Care  . Hospitalization Follow-up    Subjective:  HPI Zachary Vazquez is a 32 y.o. male with history of uncontrolled type 1 diabetes presents to establish care and hospital follow-up. Zachary Vazquez was admitted with diabetes ketoacidosis on 12/02/2017 secondary to medication non-compliance. Zachary Vazquez is uninsured and reports for this reason he inconsistently administers insulin. He was discharged 12/04/2017 after a 2 day admission for DKA and was referred her to establish with PCP. Most recent A1C 11.3 which mostly consistent with his baseline. Current regimen consists of a sliding scale short-acting insulin and Lantus 30 units. Reports that he hd a meter at home although admits that he hates sticking his finger and occasional opts not to check it before administering insulin. Denies recent hypoglycemia, abdominal pain, nausea, vomiting, neuropathy, or visual disturbances. Social History   Socioeconomic History  . Marital status: Single    Spouse name: Not on file  . Number of children: Not on file  . Years of education: Not on file  . Highest education level: Not on file  Social Needs  . Financial resource strain: Not on file  . Food insecurity - worry: Not on file  . Food insecurity - inability: Not on file  . Transportation needs - medical: Not on file  . Transportation needs - non-medical: Not on file  Occupational History  . Not on file  Tobacco Use  . Smoking status: Current Every Day Smoker    Packs/day: 1.00    Years: 15.00    Pack years: 15.00    Types: Cigarettes  . Smokeless tobacco: Never Used  Substance and Sexual Activity  . Alcohol use: No  . Drug use: Yes    Types: Marijuana    Comment: occ  . Sexual activity: Yes    Birth control/protection: None  Other Topics Concern  . Not on file  Social History Narrative  .  Not on file    Family History  Problem Relation Age of Onset  . Hypertension Mother   . Diabetes Mellitus I Brother      Review of Systems  Constitutional: Negative for fever, chills, diaphoresis, activity change, appetite change and fatigue. HENT: Negative for ear pain, nosebleeds, congestion, facial swelling, rhinorrhea, neck pain, neck stiffness and ear discharge.  Eyes: Negative for pain, discharge, redness, itching and positive for decreased vision  Respiratory: Negative for cough, choking, chest tightness, shortness of breath, wheezing and stridor.  Cardiovascular: Negative for chest pain, palpitations and leg swelling. Gastrointestinal: Negative for abdominal distention. Genitourinary: Negative for dysuria, urgency, frequency, hematuria, flank pain, decreased urine volume, difficulty urinating and dyspareunia.  Musculoskeletal: Negative for back pain, joint swelling, arthralgia and gait problem. Neurological: Negative for dizziness, tremors, seizures, syncope, facial asymmetry, speech difficulty, weakness, light-headedness, numbness and headaches.  Hematological: Negative for adenopathy. Does not bruise/bleed easily. Psychiatric/Behavioral: Negative for hallucinations, behavioral problems, confusion, dysphoric mood, decreased concentration and agitation.   Patient Active Problem List   Diagnosis Date Noted  . Increased anion gap metabolic acidosis 12/02/2017  . Nausea and vomiting 12/02/2017  . Sinus tachycardia 12/02/2017  . DKA (diabetic ketoacidosis) (HCC) 03/30/2017  . Tobacco use disorder 03/30/2017  . Type 1 diabetes mellitus (HCC) 03/30/2017  . Leukocytosis 03/30/2017  . Abnormal EKG 03/30/2017  . AKI (acute kidney injury) (HCC) 03/30/2017  . DKA, type 1 (HCC)  07/01/2016  . DKA (diabetic ketoacidoses) (HCC) 10/04/2014    Allergies  Allergen Reactions  . Clindamycin/Lincomycin Anaphylaxis    Prior to Admission medications   Medication Sig Start Date End Date  Taking? Authorizing Provider  insulin glargine (LANTUS) 100 UNIT/ML injection Inject 0.2 mLs (20 Units total) into the skin daily. 12/05/17  Yes Hall, Carole N, DO  Insulin Lispro Prot & Lispro (HUMALOG MIX 75/25 PEN [place]) Inject into the skin.   Yes [provider]  insulin aspart (NOVOLOG) 100 UNIT/ML injection Inject 7 Units into the skin 3 (three) times daily with meals. Patient not taking: Reported on 12/22/2017 12/04/17   Zachary Drop, DO  insulin NPH-regular Human (NOVOLIN 70/30) (70-30) 100 UNIT/ML injection Inject 20 Units into the skin 2 (two) times daily with a meal. Patient not taking: Reported on 12/22/2017 12/04/17   Zachary Drop, DO  NOVOLOG 100 UNIT/ML injection Take before meals tid Patient not taking: Reported on 12/22/2017 12/04/17 12/04/18  Zachary Drop, DO    Past Medical, Surgical Family and Social History reviewed and updated.    Objective:   Today's Vitals   12/22/17 0903  BP: 124/80  Pulse: 98  Temp: 98 F (36.7 C)  TempSrc: Oral  SpO2: 100%  Weight: 155 lb (70.3 kg)  Height: 5\' 11"  (1.803 m)    Wt Readings from Last 3 Encounters:  12/22/17 155 lb (70.3 kg)  03/30/17 156 lb 15.5 oz (71.2 kg)  10/07/16 150 lb (68 kg)    Physical Exam Physical Exam: Constitutional: Patient appears well-developed and well-nourished. No distress. HENT: Normocephalic, atraumatic, External right and left ear normal. Oropharynx is clear and moist.  Eyes: Conjunctivae and EOM are normal. PERRLA, no scleral icterus. Neck: Normal ROM. Neck supple. No JVD. No tracheal deviation. No thyromegaly. CVS: RRR, S1/S2 +, no murmurs, no gallops, no carotid bruit.  Pulmonary: Effort and breath sounds normal, no stridor, rhonchi, wheezes, rales.  Abdominal: Soft. BS +, no distension, tenderness, rebound or guarding.  Musculoskeletal: Normal range of motion. No edema and no tenderness.  Lymphadenopathy: No lymphadenopathy noted, cervical, inguinal or axillary Neuro: Alert. Normal  reflexes, muscle tone coordination. No cranial nerve deficit. Skin: Skin is warm and dry. No rash noted. Not diaphoretic. No erythema. No pallor. Psychiatric: Normal mood and affect. Behavior, judgment, thought content normal.   Assessment & Plan:  1. Encounter to establish care  2. Type 1 diabetes mellitus without complication (HCC), to increase medication compliance will trial a fixed short acting insulin regimen of 5 units with breakfast with a some form of food intake (doesn't routinely eat breakfast) and 7 units with lunch and dinner. Hold shortacting insulin for BS <125.  Lantus 30 units at bedtime with a high protein snack.  3. Nausea , start Zofran 4 mg every 8 hours as needed at the onset of nausea.  Increase intake of water to promote hydration.  Eat small nongreasy meals or snacks.  4. Screen thyroid disorder, check TSH panel     Meds ordered this encounter  Medications  . insulin glargine (LANTUS) 100 UNIT/ML injection    Sig: Inject 0.3 mLs (30 Units total) into the skin daily.    Dispense:  10 mL    Refill:  11    Order Specific Question:   Supervising Provider    Answer:   Quentin Angst [id number]  . insulin aspart (NOVOLOG) 100 UNIT/ML injection    Sig: 5 units daily with breakfast, 7 units with lunch, 7  units with dinner. Hold short-acting insulin if blood sugar < 125.    Dispense:  10 mL    Refill:  11    Order Specific Question:   Supervising Provider    Answer:   Quentin AngstJEGEDE, OLUGBEMIGA E [id number]]  . ondansetron (ZOFRAN) 4 MG/5ML solution    Sig: Take 5 mLs (4 mg total) by mouth every 8 (eight) hours as needed for nausea or vomiting.    Dispense:  50 mL    Refill:  3    Order Specific Question:   Supervising Provider    Answer:   Quentin AngstJEGEDE, OLUGBEMIGA E [[id number]]    Orders Placed This Encounter  Procedures  . Glucose, capillary  . Microalbumin/Creatinine Ratio, Urine  . Comprehensive metabolic panel  . CBC  . Thyroid Panel With TSH  . POCT urinalysis dip  (device)    RTC: 1 month -repeat A1C and diabetes management.   Godfrey PickKimberly S. Tiburcio PeaHarris, MSN, FNP-C [facility]  [address] [place], [place] [id number] [phone]

## 2017-12-23 ENCOUNTER — Encounter: Payer: Self-pay | Admitting: Family Medicine

## 2017-12-23 LAB — MICROALBUMIN / CREATININE URINE RATIO
Creatinine, Urine: 100.2 mg/dL
MICROALBUM., U, RANDOM: 12.7 ug/mL
Microalb/Creat Ratio: 12.7 mg/g creat (ref 0.0–30.0)

## 2017-12-23 LAB — CBC
HEMATOCRIT: 47.4 % (ref 37.5–51.0)
HEMOGLOBIN: 16.1 g/dL (ref 13.0–17.7)
MCH: 31.2 pg (ref 26.6–33.0)
MCHC: 34 g/dL (ref 31.5–35.7)
MCV: 92 fL (ref 79–97)
Platelets: 290 10*3/uL (ref 150–379)
RBC: 5.16 x10E6/uL (ref 4.14–5.80)
RDW: 13.4 % (ref 12.3–15.4)
WBC: 7.1 10*3/uL (ref 3.4–10.8)

## 2017-12-23 LAB — COMPREHENSIVE METABOLIC PANEL
ALT: 47 IU/L — AB (ref 0–44)
AST: 30 IU/L (ref 0–40)
Albumin/Globulin Ratio: 1.8 (ref 1.2–2.2)
Albumin: 4.8 g/dL (ref 3.5–5.5)
Alkaline Phosphatase: 95 IU/L (ref 39–117)
BUN/Creatinine Ratio: 21 — ABNORMAL HIGH (ref 9–20)
BUN: 20 mg/dL (ref 6–20)
Bilirubin Total: 0.6 mg/dL (ref 0.0–1.2)
CALCIUM: 10.4 mg/dL — AB (ref 8.7–10.2)
CO2: 25 mmol/L (ref 20–29)
CREATININE: 0.96 mg/dL (ref 0.76–1.27)
Chloride: 97 mmol/L (ref 96–106)
GFR calc Af Amer: 122 mL/min/{1.73_m2} (ref >59)
GFR, EST NON AFRICAN AMERICAN: 106 mL/min/{1.73_m2} (ref >59)
Globulin, Total: 2.7 g/dL (ref 1.5–4.5)
Glucose: 209 mg/dL — ABNORMAL HIGH (ref 65–99)
Potassium: 5.2 mmol/L (ref 3.5–5.2)
Sodium: 140 mmol/L (ref 134–144)
Total Protein: 7.5 g/dL (ref 6.0–8.5)

## 2017-12-23 LAB — THYROID PANEL WITH TSH
FREE THYROXINE INDEX: 2.4 (ref 1.2–4.9)
T3 Uptake Ratio: 26 % (ref 24–39)
T4, Total: 9.3 ug/dL (ref 4.5–12.0)
TSH: 2.41 u[IU]/mL (ref 0.450–4.500)

## 2017-12-23 NOTE — Progress Notes (Signed)
Mail lab letter  

## 2017-12-26 MED FILL — !LANTUS 100 UNITS/ML VIAL: 100 | 28 days supply | Qty: 10 | Fill #0

## 2017-12-26 MED FILL — !NOVOLOG 100UNITS/ML VIAL: 100/ML | 28 days supply | Qty: 10 | Fill #0

## 2018-01-05 ENCOUNTER — Ambulatory Visit: Payer: Self-pay

## 2018-01-12 ENCOUNTER — Ambulatory Visit: Payer: Self-pay | Attending: Family Medicine

## 2018-01-19 ENCOUNTER — Encounter: Payer: Self-pay | Admitting: Family Medicine

## 2018-01-19 ENCOUNTER — Ambulatory Visit (INDEPENDENT_AMBULATORY_CARE_PROVIDER_SITE_OTHER): Payer: Self-pay | Admitting: Family Medicine

## 2018-01-19 VITALS — BP 124/80 | HR 92 | Temp 98.0°F | Ht 71.0 in | Wt 167.0 lb

## 2018-01-19 DIAGNOSIS — E109 Type 1 diabetes mellitus without complications: Secondary | ICD-10-CM

## 2018-01-19 DIAGNOSIS — F17200 Nicotine dependence, unspecified, uncomplicated: Secondary | ICD-10-CM

## 2018-01-19 LAB — POCT URINALYSIS DIP (DEVICE)
Bilirubin Urine: NEGATIVE
GLUCOSE, UA: 500 mg/dL — AB
Hgb urine dipstick: NEGATIVE
Ketones, ur: NEGATIVE mg/dL
LEUKOCYTES UA: NEGATIVE
NITRITE: NEGATIVE
PROTEIN: NEGATIVE mg/dL
SPECIFIC GRAVITY, URINE: 1.02 (ref 1.005–1.030)
UROBILINOGEN UA: 0.2 mg/dL (ref 0.0–1.0)
pH: 7 (ref 5.0–8.0)

## 2018-01-19 LAB — POCT GLYCOSYLATED HEMOGLOBIN (HGB A1C): HEMOGLOBIN A1C: 10.2

## 2018-01-19 MED ORDER — INSULIN GLARGINE 100 UNIT/ML ~~LOC~~ SOLN: 35.0000 [IU] | Freq: Every day | SUBCUTANEOUS | 11 refills | Status: DC

## 2018-01-19 NOTE — Progress Notes (Signed)
Patient ID: Zachary Vazquez, male    DOB: 04/27/1986, 32 y.o.   MRN: [id number]  PCP: Zachary Vazquez  Chief Complaint  Patient presents with  . Follow-up    diabetes    Subjective:  HPI Zachary Vazquez is a 32 y.o. male with Type 1 diabetes, current everyday smoker,  presents for evaluation of one month diabetes follow-up.  Zachary Vazquez continues to experience erratic home glucose readings. He reports improved compliance with insulin medication regimen although admits to continue poor dietary choices including fat food, artificially sweetened beverages, and high carbohydrates. His last A1C 11.3, 1 month prior.  Current regimen consists of short-acting insulin 5 units with breakfast, 7 units with lunch and dinner daily, and Lantus at bedtime 30 units with a protein snack. He is uninsured and is not followed by endocrinology due to financial constraints. Denies chest pain, shortness of breath, dizziness, weakness, headaches, or recent hypoglycemia. No recent eye exam due to no health insurance.  Social History   Socioeconomic History  . Marital status: Single    Spouse name: Not on file  . Number of children: Not on file  . Years of education: Not on file  . Highest education level: Not on file  Occupational History  . Not on file  Social Needs  . Financial resource strain: Not on file  . Food insecurity:    Worry: Not on file    Inability: Not on file  . Transportation needs:    Medical: Not on file    Non-medical: Not on file  Tobacco Use  . Smoking status: Current Every Day Smoker    Packs/day: 1.00    Years: 15.00    Pack years: 15.00    Types: Cigarettes  . Smokeless tobacco: Never Used  Substance and Sexual Activity  . Alcohol use: No  . Drug use: Yes    Types: Marijuana    Comment: occ  . Sexual activity: Yes    Birth control/protection: None  Lifestyle  . Physical activity:    Days per week: Not on file    Minutes per session: Not on file  . Stress: Not on  file  Relationships  . Social connections:    Talks on phone: Not on file    Gets together: Not on file    Attends religious service: Not on file    Active member of club or organization: Not on file    Attends meetings of clubs or organizations: Not on file    Relationship status: Not on file  . Intimate partner violence:    Fear of current or ex partner: Not on file    Emotionally abused: Not on file    Physically abused: Not on file    Forced sexual activity: Not on file  Other Topics Concern  . Not on file  Social History Narrative  . Not on file    Family History  Problem Relation Age of Onset  . Hypertension Mother   . Diabetes Mellitus I Brother    Review of Systems Pertinent negatives listed in HPI Patient Active Problem List   Diagnosis Date Noted  . Nausea and vomiting 12/02/2017  . Tobacco use disorder 03/30/2017  . Type 1 diabetes mellitus (HCC) 03/30/2017  . Leukocytosis 03/30/2017    Allergies  Allergen Reactions  . Clindamycin/Lincomycin Anaphylaxis    Prior to Admission medications   Medication Sig Start Date End Date Taking? Authorizing Provider  insulin aspart (NOVOLOG) 100 UNIT/ML injection 5 units  daily with breakfast, 7 units with lunch, 7 units with dinner. Hold short-acting insulin if blood sugar < 125. 12/22/17  Yes Zachary Vazquez  insulin glargine (LANTUS) 100 UNIT/ML injection Inject 0.3 mLs (30 Units total) into the skin daily. 12/22/17  Yes Zachary Vazquez  insulin glargine (LANTUS) 100 UNIT/ML injection Inject 0.35 mLs (35 Units total) into the skin daily. 01/19/18   Zachary Vazquez    Past Medical, Surgical Family and Social History reviewed and updated.    Objective:   Today'Vazquez Vitals   01/19/18 0829  BP: 124/80  Pulse: 92  Temp: 98 F (36.7 C)  TempSrc: Oral  SpO2: 99%  Weight: 167 lb (75.8 kg)  Height: 5\' 11"  (1.803 m)    Wt Readings from Last 3 Encounters:  01/19/18 167 lb (75.8 kg)  12/22/17 155  lb (70.3 kg)  03/30/17 156 lb 15.5 oz (71.2 kg)   Physical Exam Constitutional: Patient appears well-developed and well-nourished. No distress. HENT: Normocephalic, atraumatic, External right and left ear normal. Oropharynx is clear and moist.  Eyes: Conjunctivae and EOM are normal. PERRLA, no scleral icterus. Neck: Normal ROM. Neck supple. No JVD. No tracheal deviation. No thyromegaly. CVS: RRR, S1/S2 +, no murmurs, no gallops, no carotid bruit.  Pulmonary: Effort and breath sounds normal, no stridor, rhonchi, wheezes, rales.  Abdominal: Soft. BS +, no distension, tenderness, rebound or guarding.  Musculoskeletal: Normal range of motion. No edema and no tenderness.  Lymphadenopathy: No lymphadenopathy noted, cervical, inguinal or axillary Neuro: Alert. Normal reflexes, muscle tone coordination. No cranial nerve deficit. Skin: Skin is warm and dry. No rash noted. Not diaphoretic. No erythema. No pallor. Psychiatric: Normal mood and affect. Behavior, judgment, thought content normal.     Assessment & Plan:  1. Type 1 diabetes mellitus without complication (HCC), A1C improved today 10.2. Reinforced importance of consistent compliance with medication and dietary recommendations. Continue fixed short acting insulin regimen of 5 units with breakfast with a some form of food intake (doesn't routinely eat breakfast) and 7 units with lunch and dinner. Hold shortacting insulin for BS <125. Increasing Lantus 35 units at bedtime with a high protein snack. Referring you to diabetes education for additional support and assistance with improving overall control of diabetes.  Ambulatory referral to diabetic education  2. Smoking addiction, not ready stage. Continue to reinforce the importance of cessation to improve overall health outcomes.   Orders Placed This Encounter  Procedures  . Ambulatory referral to diabetic education  . POCT glycosylated hemoglobin (Hb A1C)  . POCT urinalysis dip (device)     Meds ordered this encounter  Medications  . insulin glargine (LANTUS) 100 UNIT/ML injection    Sig: Inject 0.35 mLs (35 Units total) into the skin daily.    Dispense:  10 mL    Refill:  11    RTC: 2 months for diabetes management. Goal A1C <9   Godfrey Pick. Tiburcio Pea, MSN, Vazquez-C [facility]  [address] [place], [place] [address] [phone]

## 2018-01-19 NOTE — Progress Notes (Signed)
Patient ID: Zachary Vazquez, male    DOB: 11-21-1985, 32 y.o.   MRN: [id number]  PCP: Bing Neighbors, FNP  Chief Complaint  Patient presents with  . Follow-up    diabetes    Subjective:  HPI  Zachary Vazquez is a 32 y.o. male presents for follow up diabetes type 1. Zachary Vazquez was seen as a new patient in the clinic 4 weeks ago. He was recently restarted on insulin therapy. He had previously been non compliant with insulin and management of his diabetes. This current visits he endorses compliance with his prescribed insulin regimen. He endorses that he hasn't been compliant dietary management for control of his diabetes. He denies chest pain, palpitations, or shortness of breath.   Social History   Socioeconomic History  . Marital status: Single    Spouse name: Not on file  . Number of children: Not on file  . Years of education: Not on file  . Highest education level: Not on file  Occupational History  . Not on file  Social Needs  . Financial resource strain: Not on file  . Food insecurity:    Worry: Not on file    Inability: Not on file  . Transportation needs:    Medical: Not on file    Non-medical: Not on file  Tobacco Use  . Smoking status: Current Every Day Smoker    Packs/day: 1.00    Years: 15.00    Pack years: 15.00    Types: Cigarettes  . Smokeless tobacco: Never Used  Substance and Sexual Activity  . Alcohol use: No  . Drug use: Yes    Types: Marijuana    Comment: occ  . Sexual activity: Yes    Birth control/protection: None  Lifestyle  . Physical activity:    Days per week: Not on file    Minutes per session: Not on file  . Stress: Not on file  Relationships  . Social connections:    Talks on phone: Not on file    Gets together: Not on file    Attends religious service: Not on file    Active member of club or organization: Not on file    Attends meetings of clubs or organizations: Not on file    Relationship status: Not on file  . Intimate  partner violence:    Fear of current or ex partner: Not on file    Emotionally abused: Not on file    Physically abused: Not on file    Forced sexual activity: Not on file  Other Topics Concern  . Not on file  Social History Narrative  . Not on file    Family History  Problem Relation Age of Onset  . Hypertension Mother   . Diabetes Mellitus I Brother      Review of Systems  Constitutional: Negative.   Eyes: Negative for visual disturbance.  Respiratory: Negative.   Cardiovascular: Negative.   Gastrointestinal: Negative.   Neurological: Negative.     Patient Active Problem List   Diagnosis Date Noted  . Nausea and vomiting 12/02/2017  . Tobacco use disorder 03/30/2017  . Type 1 diabetes mellitus (HCC) 03/30/2017  . Leukocytosis 03/30/2017    Allergies  Allergen Reactions  . Clindamycin/Lincomycin Anaphylaxis    Prior to Admission medications   Medication Sig Start Date End Date Taking? Authorizing Provider  insulin aspart (NOVOLOG) 100 UNIT/ML injection 5 units daily with breakfast, 7 units with lunch, 7 units with dinner. Hold short-acting insulin if blood  sugar < 125. 12/22/17  Yes Bing NeighborsHarris, Kimberly S, FNP  insulin glargine (LANTUS) 100 UNIT/ML injection Inject 0.3 mLs (30 Units total) into the skin daily. 12/22/17  Yes Bing NeighborsHarris, Kimberly S, FNP  insulin glargine (LANTUS) 100 UNIT/ML injection Inject 0.35 mLs (35 Units total) into the skin daily. 01/19/18   Bing NeighborsHarris, Kimberly S, FNP    Past Medical, Surgical Family and Social History reviewed and updated.    Objective:   Today's Vitals   01/19/18 0829  BP: 124/80  Pulse: 92  Temp: 98 F (36.7 C)  TempSrc: Oral  SpO2: 99%  Weight: 167 lb (75.8 kg)  Height: 5\' 11"  (1.803 m)    Wt Readings from Last 3 Encounters:  01/19/18 167 lb (75.8 kg)  12/22/17 155 lb (70.3 kg)  03/30/17 156 lb 15.5 oz (71.2 kg)    Physical Exam  Constitutional: He is oriented to person, place, and time. He appears well-developed  and well-nourished.  HENT:  Head: Normocephalic and atraumatic.  Cardiovascular: Normal rate, regular rhythm and normal heart sounds.  Pulmonary/Chest: Effort normal and breath sounds normal.  Neurological: He is alert and oriented to person, place, and time.  Psychiatric: He has a normal mood and affect. His behavior is normal. Judgment and thought content normal.           Assessment & Plan:  1. Type 1 diabetes mellitus without complication (HCC) - POCT glycosylated hemoglobin (Hb A1C) - Ambulatory referral to diabetic education - insulin glargine (LANTUS) 100 UNIT/ML injection; Inject 0.35 mLs (35 Units total) into the skin daily.  Dispense: 10 mL; Refill: 11  2. Smoking addiction Discussed with patient on cutting back on cigarette consumption to eventually quit smoking   If symptoms worsen or do not improve, return for follow-up, follow-up with PCP, or at the emergency department if severity of symptoms warrant a higher level of care. Legrand Pittsiffany Finnean Cerami FNP Student

## 2018-01-19 NOTE — Patient Instructions (Addendum)
I am increasing your lantus from 30 units to 35 units for improvement of blood sugars  I am referring you to diabetes education to help with diet and management of blood glucose  Continue to monitor blood sugars consistently   Type 1 Diabetes Mellitus, Self Care, Adult When you have type 1 diabetes (type 1 diabetes mellitus), you must keep your blood sugar (glucose) under control. You can do this with:  Insulin.  Nutrition.  Exercise.  Lifestyle changes.  Other medicines, if needed.  Support from your doctors and others.  How do I manage my blood sugar?  Check your blood sugar every day, as often as told.  Call your doctor if your blood sugar is above your goal numbers for 2 tests in a row.  Have your A1c (hemoglobin A1c) level checked at least twice a year. Have it checked more often if your doctor tells you to. Your doctor will set treatment goals for you. Generally, you should have these blood sugar levels:  Before meals (preprandial): 80-130 mg/dL (4.4-7.2 mmol/L).  After meals (postprandial): below 180 mg/dL (10 mmol/L).  A1c level: less than 7%.  What do I need to know about high blood sugar? High blood sugar is called hyperglycemia. Know the signs of high blood sugar. Signs may include:  Feeling: ? Thirsty. ? Hungry. ? Very tired.  Needing to pee (urinate) more than usual.  Blurry vision.  What do I need to know about low blood sugar? Low blood sugar is called hypoglycemia. This is when blood sugar is at or below 70 mg/dL (3.9 mmol/L). Symptoms may include:  Feeling: ? Hungry. ? Worried or nervous (anxious). ? Sweaty and clammy. ? Confused. ? Dizzy. ? Sleepy. ? Sick to your stomach (nauseous).  Having: ? A fast heartbeat. ? A headache. ? A change in your vision. ? Jerky movements that you cannot control (seizure). ? Nightmares. ? Tingling or no feeling (numbness) around the mouth, lips, or tongue.  Having trouble with: ? Talking. ? Paying  attention (concentrating). ? Moving (coordination). ? Sleeping.  Shaking.  Passing out (fainting).  Getting upset easily (irritability).  Treating low blood sugar  To treat low blood sugar, eat or drink something sugary right away. If you can think clearly and swallow safely, follow the 15:15 rule:  Take 15 grams of a fast-acting carb (carbohydrate). Some fast-acting carbs are: ? 1 tube of glucose gel. ? 3 sugar tablets (glucose pills). ? 6-8 pieces of hard candy. ? 4 oz (120 mL) of fruit juice. ? 4 oz (120 mL) regular (not diet) soda.  Check your blood sugar 15 minutes after you take the carb.  If your blood sugar is still at or below 70 mg/dL (3.9 mmol/L), take 15 grams of a carb again.  If your blood sugar does not go above 70 mg/dL (3.9 mmol/L) after 3 tries, get help right away.  After your blood sugar goes back to normal, eat a meal or a snack within 1 hour.  Treating very low blood sugar If your blood sugar is at or below 54 mg/dL (3 mmol/L), you have very low blood sugar (severe hypoglycemia). This is an emergency. Do not wait to see if the symptoms will go away. Get medical help right away. Call your local emergency services (911 in the [place] Do not drive yourself to the hospital. If you have very low blood sugar and you cannot eat or drink, you may need a glucagon shot (injection). A family member or friend  should learn how to check your blood sugar and how to give you a glucagon shot. Ask your doctor if you need to have a glucagon shot kit at home. What else is important to manage my diabetes? Medicine  Take insulin and diabetes medicines as told.  Adjust your insulin and medicines as told.  Do not run out of insulin or medicines. Having diabetes can put you at risk for other long-term (chronic) conditions. These may include heart disease and kidney disease. Your doctor may prescribe medicines to help prevent problems from diabetes. Food  Make healthy food  choices. These include: ? Chicken, fish, egg whites, and beans. ? Oats, whole wheat, bulgur, brown rice, quinoa, and millet. ? Fresh fruits and vegetables. ? Low-fat dairy products. ? Nuts, avocado, olive oil, and canola oil.  Meet with a food specialist (registered dietitian). He or she can help you make an eating plan that is right for you.  Follow instructions from your doctor about what you cannot eat or drink.  Drink enough fluid to keep your pee (urine) clear or pale yellow.  Eat healthy snacks between healthy meals.  Keep track of carbs that you eat. Do this by reading food labels and learning food serving sizes.  Follow your sick day plan when you cannot eat or drink normally. Make this plan with your doctor so it is ready to use. Activity   Exercise at least 3 times a week.  Do not go more than 2 days without exercising.  Talk with your doctor before you start a new exercise. Your doctor may need to adjust your insulin, medicines, or food. Lifestyle   Do not use any tobacco products. These include cigarettes, chewing tobacco, and e-cigarettes. If you need help quitting, ask your doctor.  Ask your doctor how much alcohol is safe for you.  Learn to deal with stress. If you need help with this, ask your doctor. Body care  Stay up to date with your shots (immunizations).  Have your eyes and feet checked by a doctor as often as told.  Check your skin and feet every day. Check for cuts, bruises, redness, blisters, or sores.  Brush your teeth and gums two times a day, and floss at least one time a day.  Go to the dentist least one time every 6 months.  Stay at a healthy weight. General instructions   Take over-the-counter and prescription medicines only as told by your doctor.  Share your diabetes care plan with: ? Your work or school. ? People you live with.  Check your pee (urine) for ketones: ? When you are sick. ? As told by your doctor.  Carry a card  or wear jewelry that says that you have diabetes.  Ask your doctor: ? Do I need to meet with a diabetes educator? ? Where can I find a support group for people with diabetes?  Keep all follow-up visits as told by your doctor. This is important. Where to find more information: To learn more about diabetes, visit:  American Diabetes Association: www.diabetes.org  American Association of Diabetes Educators: www.diabeteseducator.org/patient-resources  This information is not intended to replace advice given to you by your health care provider. Make sure you discuss any questions you have with your health care provider. Document Released: 02/05/2016 Document Revised: 03/21/2016 Document Reviewed: 11/17/2015 Elsevier Interactive Patient Education  2018 [employer].   Smoking Tobacco Information Smoking tobacco will very likely harm your health. Tobacco contains a poisonous (toxic), colorless chemical called  nicotine. Nicotine affects the brain and makes tobacco addictive. This change in your brain can make it hard to stop smoking. Tobacco also has other toxic chemicals that can hurt your body and raise your risk of many cancers. How can smoking tobacco affect me? Smoking tobacco can increase your chances of having serious health conditions, such as:  Cancer. Smoking is most commonly associated with lung cancer, but can lead to cancer in other parts of the body.  Chronic obstructive pulmonary disease (COPD). This is a long-term lung condition that makes it hard to breathe. It also gets worse over time.  High blood pressure (hypertension), heart disease, stroke, or heart attack.  Lung infections, such as pneumonia.  Cataracts. This is when the lenses in the eyes become clouded.  Digestive problems. This may include peptic ulcers, heartburn, and gastroesophageal reflux disease (GERD).  Oral health problems, such as gum disease and tooth loss.  Loss of taste and smell.  Smoking can  affect your appearance by causing:  Wrinkles.  Yellow or stained teeth, fingers, and fingernails.  Smoking tobacco can also affect your social life.  Many workplaces, [employer], hotels, and public places are tobacco-free. This means that you may experience challenges in finding places to smoke when away from home.  The cost of a smoking habit can be expensive. Expenses for someone who smokes come in two ways: ? You spend money on a regular basis to buy tobacco. ? Your health care costs in the long-term are higher if you smoke.  Tobacco smoke can also affect the health of those around you. Children of smokers have greater chances of: ? Sudden infant death syndrome (SIDS). ? Ear infections. ? Lung infections.  What lifestyle changes can be made?  Do not start smoking. Quit if you already do.  To quit smoking: ? Make a plan to quit smoking and commit yourself to it. Look for programs to help you and ask your health care provider for recommendations and ideas. ? Talk with your health care provider about using nicotine replacement medicines to help you quit. Medicine replacement medicines include gum, lozenges, patches, sprays, or pills. ? Do not replace cigarette smoking with electronic cigarettes, which are commonly called e-cigarettes. The safety of e-cigarettes is not known, and some may contain harmful chemicals. ? Avoid places, people, or situations that tempt you to smoke. ? If you try to quit but return to smoking, don't give up hope. It is very common for people to try a number of times before they fully succeed. When you feel ready again, give it another try.  Quitting smoking might affect the way you eat as well as your weight. Be prepared to monitor your eating habits. Get support in planning and following a healthy diet.  Ask your health care provider about having regular tests (screenings) to check for cancer. This may include blood tests, imaging tests, and other  tests.  Exercise regularly. Consider taking walks, joining a gym, or doing yoga or exercise classes.  Develop skills to manage your stress. These skills include meditation. What are the benefits of quitting smoking? By quitting smoking, you may:  Lower your risk of getting cancer and other diseases caused by smoking.  Live longer.  Breathe better.  Lower your blood pressure and heart rate.  Stop your addiction to tobacco.  Stop creating secondhand smoke that hurts other people.  Improve your sense of taste and smell.  Look better over time, due to having fewer wrinkles and less staining.  What can happen if changes are not made? If you do not stop smoking, you may:  Get cancer and other diseases.  Develop COPD or other long-term (chronic) lung conditions.  Develop serious problems with your heart and blood vessels (cardiovascular system).  Need more tests to screen for problems caused by smoking.  Have higher, long-term healthcare costs from medicines or treatments related to smoking.  Continue to have worsening changes in your lungs, mouth, and nose.  Where to find support: To get support to quit smoking, consider:  Asking your health care provider for more information and resources.  Taking classes to learn more about quitting smoking.  Looking for local organizations that offer resources about quitting smoking.  Joining a support group for people who want to quit smoking in your local community.  Where to find more information: You may find more information about quitting smoking from:  HelpGuide.org: www.helpguide.org/articles/addictions/how-to-quit-smoking.htm  https://hall.com/: smokefree.gov  American Lung Association: www.lung.org  Contact a health care provider if:  You have problems breathing.  Your lips, nose, or fingers turn blue.  You have chest pain.  You are coughing up blood.  You feel faint or you pass out.  You have other noticeable  changes that cause you to worry. Summary  Smoking tobacco can negatively affect your health, the health of those around you, your finances, and your social life.  Do not start smoking. Quit if you already do. If you need help quitting, ask your health care provider.  Think about joining a support group for people who want to quit smoking in your local community. There are many effective programs that will help you to quit this behavior. This information is not intended to replace advice given to you by your health care provider. Make sure you discuss any questions you have with your health care provider. Document Released: 10/29/2016 Document Revised: 10/29/2016 Document Reviewed: 10/29/2016 Elsevier Interactive Patient Education  [employer].

## 2018-02-13 ENCOUNTER — Ambulatory Visit: Payer: Self-pay | Admitting: *Deleted

## 2018-02-20 MED FILL — !LANTUS 100 UNITS/ML VIAL: 100 | 28 days supply | Qty: 10 | Fill #0

## 2018-03-24 ENCOUNTER — Ambulatory Visit: Payer: Self-pay | Admitting: Family Medicine

## 2018-04-01 ENCOUNTER — Encounter: Payer: Self-pay | Admitting: Family Medicine

## 2018-04-01 ENCOUNTER — Ambulatory Visit (INDEPENDENT_AMBULATORY_CARE_PROVIDER_SITE_OTHER): Payer: Self-pay | Admitting: Family Medicine

## 2018-04-01 VITALS — BP 122/80 | HR 82 | Temp 98.0°F | Ht 71.0 in | Wt 162.0 lb

## 2018-04-01 DIAGNOSIS — E109 Type 1 diabetes mellitus without complications: Secondary | ICD-10-CM

## 2018-04-01 DIAGNOSIS — R7309 Other abnormal glucose: Secondary | ICD-10-CM

## 2018-04-01 DIAGNOSIS — Z09 Encounter for follow-up examination after completed treatment for conditions other than malignant neoplasm: Secondary | ICD-10-CM

## 2018-04-01 LAB — POCT URINALYSIS DIP (MANUAL ENTRY)
Bilirubin, UA: NEGATIVE
Blood, UA: NEGATIVE
Glucose, UA: 500 mg/dL — AB
Leukocytes, UA: NEGATIVE
Nitrite, UA: NEGATIVE
Spec Grav, UA: 1.025 (ref 1.010–1.025)
Urobilinogen, UA: 0.2 E.U./dL
pH, UA: 6 (ref 5.0–8.0)

## 2018-04-01 LAB — GLUCOSE, POCT (MANUAL RESULT ENTRY): POC Glucose: 281 mg/dl — AB (ref 70–99)

## 2018-04-01 LAB — POCT GLYCOSYLATED HEMOGLOBIN (HGB A1C): Hemoglobin A1C: 9.1 % — AB (ref 4.0–5.6)

## 2018-04-01 MED ORDER — INSULIN GLARGINE 100 UNIT/ML ~~LOC~~ SOLN: 30.0000 [IU] | Freq: Every day | SUBCUTANEOUS | 11 refills | Status: DC

## 2018-04-01 MED ORDER — INSULIN ASPART 100 UNIT/ML ~~LOC~~ SOLN: SUBCUTANEOUS | 11 refills | Status: DC

## 2018-04-01 MED FILL — !NOVOLOG 100UNITS/ML VIAL: 100/ML | 28 days supply | Qty: 10 | Fill #0

## 2018-04-01 MED FILL — !LANTUS 100 UNITS/ML VIAL: 100 | 28 days supply | Qty: 10 | Fill #0

## 2018-04-04 NOTE — Progress Notes (Signed)
   Subjective:    Patient ID: Zachary Vazquez, male    DOB: 05/09/1986, 32 y.o.   MRN: [id number]   PCP: Raliegh IpNatalie Niana Martorana, NP  Chief Complaint  Patient presents with  . Follow-up    diabetes   HPI  Mrs. Zachary Vazquez has a history of Type 1 Diabetes. He is here today for follow up.   Current Status: He states that he is doing well. He continues to take his insulin as directed. He denies lightheadedness, sweating, confusion, heart palpitations, and shakiness. Denies fevers chills, fatigue, weight loss, and night sweats. He denies visual changes, headaches, dizziness, and falls.   He reports no chest pain, cough, and shortness of breath.   Denies any GI symptoms.   He denies pain today.   Review of Systems  Constitutional: Negative.   HENT: Negative.   Eyes: Negative.   Respiratory: Negative.   Cardiovascular: Negative.   Gastrointestinal: Negative.   Endocrine: Negative.   Genitourinary: Negative.   Musculoskeletal: Negative.   Skin: Negative.   Allergic/Immunologic: Negative.   Neurological: Negative.   Hematological: Negative.   Psychiatric/Behavioral: Negative.    Objective:   Physical Exam  Constitutional: He is oriented to person, place, and time. He appears well-developed and well-nourished.  HENT:  Head: Normocephalic and atraumatic.  Right Ear: External ear normal.  Left Ear: External ear normal.  Nose: Nose normal.  Mouth/Throat: Oropharynx is clear and moist.  Eyes: Pupils are equal, round, and reactive to light. Conjunctivae and EOM are normal.  Neck: Normal range of motion. Neck supple.  Cardiovascular: Normal rate, regular rhythm, normal heart sounds and intact distal pulses.  Pulmonary/Chest: Effort normal and breath sounds normal.  Abdominal: Soft. Bowel sounds are normal.  Musculoskeletal: Normal range of motion.  Neurological: He is alert and oriented to person, place, and time.  Skin: Skin is warm and dry. Capillary refill takes less than 2 seconds.   Psychiatric: He has a normal mood and affect. His behavior is normal. Judgment and thought content normal.  Nursing note and vitals reviewed.  Assessment & Plan:   1. Type 1 diabetes mellitus without complication (HCC) Glucose is elevated at 281 and Hgb A1c is 9.1, decreased from 10.2 on 01/19/2018. He will continue to decrease foods high in sugars and carbohydrates.  - POCT glycosylated hemoglobin (Hb A1C) - POCT urinalysis dipstick - insulin aspart (NOVOLOG) 100 UNIT/ML injection; 5 units daily with breakfast, 7 units with lunch, 7 units with dinner. Hold short-acting insulin if blood sugar < 125.  Dispense: 10 mL; Refill: 11 - insulin glargine (LANTUS) 100 UNIT/ML injection; Inject 0.3 mLs (30 Units total) into the skin daily.  Dispense: 10 mL; Refill: 11  2. Elevated glucose - POCT glucose (manual entry)  3. Follow up He will follow up in 3 months.   Meds ordered this encounter  Medications  . insulin aspart (NOVOLOG) 100 UNIT/ML injection    Sig: 5 units daily with breakfast, 7 units with lunch, 7 units with dinner. Hold short-acting insulin if blood sugar < 125.    Dispense:  10 mL    Refill:  11  . insulin glargine (LANTUS) 100 UNIT/ML injection    Sig: Inject 0.3 mLs (30 Units total) into the skin daily.    Dispense:  10 mL    Refill:  11   Raliegh IpNatalie Lenoard Helbert,  MSN, FNP-BC Patient [facility] [address]  [place], [place] [id number] [phone]

## 2018-05-08 MED FILL — !LANTUS 100 UNITS/ML VIAL: 100 | 56 days supply | Qty: 20 | Fill #1

## 2018-07-03 ENCOUNTER — Ambulatory Visit: Payer: Self-pay | Admitting: Family Medicine

## 2018-07-06 ENCOUNTER — Ambulatory Visit: Payer: Self-pay | Admitting: Family Medicine

## 2018-07-09 MED FILL — $LANTUS 100 UNITS/ML VIAL: 100 | 100 days supply | Qty: 30 | Fill #2

## 2018-07-13 ENCOUNTER — Ambulatory Visit: Payer: Self-pay | Admitting: Family Medicine

## 2018-07-15 ENCOUNTER — Ambulatory Visit: Payer: Self-pay | Admitting: Family Medicine

## 2018-07-22 ENCOUNTER — Ambulatory Visit (INDEPENDENT_AMBULATORY_CARE_PROVIDER_SITE_OTHER): Payer: Self-pay | Admitting: Family Medicine

## 2018-07-22 ENCOUNTER — Encounter: Payer: Self-pay | Admitting: Family Medicine

## 2018-07-22 VITALS — BP 122/76 | HR 94 | Temp 98.2°F | Ht 71.0 in | Wt 163.0 lb

## 2018-07-22 DIAGNOSIS — Z09 Encounter for follow-up examination after completed treatment for conditions other than malignant neoplasm: Secondary | ICD-10-CM

## 2018-07-22 DIAGNOSIS — E109 Type 1 diabetes mellitus without complications: Secondary | ICD-10-CM

## 2018-07-22 DIAGNOSIS — N529 Male erectile dysfunction, unspecified: Secondary | ICD-10-CM

## 2018-07-22 LAB — POCT URINALYSIS DIP (MANUAL ENTRY)
Bilirubin, UA: NEGATIVE
Blood, UA: NEGATIVE
Glucose, UA: >=1000 mg/dL — AB
Leukocytes, UA: NEGATIVE
Nitrite, UA: NEGATIVE
Protein Ur, POC: NEGATIVE mg/dL
Spec Grav, UA: >=1.03 — AB (ref 1.010–1.025)
Urobilinogen, UA: 1 E.U./dL
pH, UA: 6 (ref 5.0–8.0)

## 2018-07-22 LAB — POCT GLYCOSYLATED HEMOGLOBIN (HGB A1C): Hemoglobin A1C: 9.4 % — AB (ref 4.0–5.6)

## 2018-07-22 LAB — GLUCOSE, POCT (MANUAL RESULT ENTRY): POC Glucose: 246 mg/dl — AB (ref 70–99)

## 2018-07-22 MED ORDER — SILDENAFIL CITRATE 100 MG PO TABS: 50.0000 mg | ORAL_TABLET | Freq: Every day | ORAL | 11 refills | Status: DC | PRN

## 2018-07-22 NOTE — Progress Notes (Signed)
Follow Up  Subjective:    Patient ID: Zachary Vazquez, male    DOB: 06/26/1986, 32 y.o.   MRN: [id number]   No chief complaint on file.  HPI Zachary Vazquez is a 32 year old with a past medical history of Diabetes Type I. He is here for follow up today.   Current Status: Since his last office visit, he is doing well with no complaints. He states that he has intermittent tingling over his entire body. He does not take Novolog and he does not check his blood glucose. He denies increased thirst, frequent urination, hunger, fatigue, blurred vision, excessive hunger, excessive thirst, weight gain, weight loss, and poor wound healing.   He denies fevers, chills,  recent infections, weight loss, and night sweats. He has not had any headaches, dizziness, and falls. No chest pain, heart palpitations, cough and shortness of breath reported. No reports of GI problems such as nausea, vomiting, diarrhea, and constipation. He has no reports of blood in stools, dysuria and hematuria. No depression or anxiety reported. He denies pain today.   Review of Systems  Constitutional: Negative.   HENT: Negative.   Respiratory: Negative.   Cardiovascular: Negative.   Gastrointestinal: Negative.   Genitourinary: Negative.   Musculoskeletal: Negative.   Skin: Negative.   Neurological: Negative.   Psychiatric/Behavioral: Negative.      Objective:   Physical Exam  Constitutional: He is oriented to person, place, and time.  Cardiovascular: Normal rate, regular rhythm, normal heart sounds and intact distal pulses.  Pulmonary/Chest: Effort normal and breath sounds normal.  Abdominal: Soft. Bowel sounds are normal.  Musculoskeletal: Normal range of motion.  Neurological: He is alert and oriented to person, place, and time.  Skin: Skin is dry.  Psychiatric: He has a normal mood and affect. His behavior is normal. Judgment and thought content normal.    Assessment & Plan:   1. Type 1 diabetes mellitus without  complication (HCC) Hgb A1c is stable at 9.4 today. He will continue Insulin as prescribed. He will continue to decrease foods/beverages high in sugars and carbs and follow Heart Healthy or DASH diet. Increase physical activity to at least 30 minutes cardio exercise daily. We will further assess Urinalysis.  - POCT glycosylated hemoglobin (Hb A1C) - POCT urinalysis dipstick - POCT glucose (manual entry)  2. Erectile dysfunction, unspecified erectile dysfunction type We will initiate a trial of Sildenafil today. Patient cautioned not to take nitrates with medication. Monitor.  - sildenafil (VIAGRA) 100 MG tablet; Take 0.5-1 tablets (50-100 mg total) by mouth daily as needed for erectile dysfunction.  Dispense: 5 tablet; Refill: 11  3. Follow up He will follow up in 2 months   Meds ordered this encounter  Medications  . sildenafil (VIAGRA) 100 MG tablet    Sig: Take 0.5-1 tablets (50-100 mg total) by mouth daily as needed for erectile dysfunction.    Dispense:  5 tablet    Refill:  11   Raliegh Ip,  MSN, [facility] Patient [facility] [address] [place], [place] [address] [phone]

## 2018-07-22 NOTE — Patient Instructions (Signed)
Sildenafil tablets (Viagra) What is this medicine? SILDENAFIL (sil DEN a fil) is used to treat erection problems in men. This medicine may be used for other purposes; ask your health care provider or pharmacist if you have questions. COMMON BRAND NAME(S): Viagra What should I tell my health care provider before I take this medicine? They need to know if you have any of these conditions: -bleeding disorders -eye or vision problems, including a rare inherited eye disease called retinitis pigmentosa -anatomical deformation of the penis, Peyronie's disease, or history of priapism (painful and prolonged erection) -heart disease, angina, a history of heart attack, irregular heart beats, or other heart problems -high or low blood pressure -history of blood diseases, like sickle cell anemia or leukemia -history of stomach bleeding -kidney disease -liver disease -stroke -an unusual or allergic reaction to sildenafil, other medicines, foods, dyes, or preservatives -pregnant or trying to get pregnant -breast-feeding How should I use this medicine? Take this medicine by mouth with a glass of water. Follow the directions on the prescription label. The dose is usually taken 1 hour before sexual activity. You should not take the dose more than once per day. Do not take your medicine more often than directed. Talk to your pediatrician regarding the use of this medicine in children. This medicine is not used in children for this condition. Overdosage: If you think you have taken too much of this medicine contact a poison control center or emergency room at once. NOTE: This medicine is only for you. Do not share this medicine with others. What if I miss a dose? This does not apply. Do not take double or extra doses. What may interact with this medicine? Do not take this medicine with any of the following medications: -cisapride -nitrates like amyl nitrite, isosorbide dinitrate, isosorbide mononitrate,  nitroglycerin -riociguat This medicine may also interact with the following medications: -antiviral medicines for HIV or AIDS -bosentan -certain medicines for benign prostatic hyperplasia (BPH) -certain medicines for blood pressure -certain medicines for fungal infections like ketoconazole and itraconazole -cimetidine -erythromycin -rifampin This list may not describe all possible interactions. Give your health care provider a list of all the medicines, herbs, non-prescription drugs, or dietary supplements you use. Also tell them if you smoke, drink alcohol, or use illegal drugs. Some items may interact with your medicine. What should I watch for while using this medicine? If you notice any changes in your vision while taking this drug, call your doctor or health care professional as soon as possible. Stop using this medicine and call your health care provider right away if you have a loss of sight in one or both eyes. Contact your doctor or health care professional right away if you have an erection that lasts longer than 4 hours or if it becomes painful. This may be a sign of a serious problem and must be treated right away to prevent permanent damage. If you experience symptoms of nausea, dizziness, chest pain or arm pain upon initiation of sexual activity after taking this medicine, you should refrain from further activity and call your doctor or health care professional as soon as possible. Do not drink alcohol to excess (examples, 5 glasses of wine or 5 shots of whiskey) when taking this medicine. When taken in excess, alcohol can increase your chances of getting a headache or getting dizzy, increasing your heart rate or lowering your blood pressure. Using this medicine does not protect you or your partner against HIV infection (the virus that causes   AIDS) or other sexually transmitted diseases. What side effects may I notice from receiving this medicine? Side effects that you should report  to your doctor or health care professional as soon as possible: -allergic reactions like skin rash, itching or hives, swelling of the face, lips, or tongue -breathing problems -changes in hearing -changes in vision -chest pain -fast, irregular heartbeat -prolonged or painful erection -seizures Side effects that usually do not require medical attention (report to your doctor or health care professional if they continue or are bothersome): -back pain -dizziness -flushing -headache -indigestion -muscle aches -nausea -stuffy or runny nose This list may not describe all possible side effects. Call your doctor for medical advice about side effects. You may report side effects to FDA at 1-800-FDA-1088. Where should I keep my medicine? Keep out of reach of children. Store at room temperature between 15 and 30 degrees C (59 and 86 degrees F). Throw away any unused medicine after the expiration date. NOTE: This sheet is a summary. It may not cover all possible information. If you have questions about this medicine, talk to your doctor, pharmacist, or health care provider.  2018 Elsevier/Gold Standard (2015-09-27 12:00:25)  

## 2018-07-23 ENCOUNTER — Telehealth: Payer: Self-pay

## 2018-07-23 DIAGNOSIS — N529 Male erectile dysfunction, unspecified: Secondary | ICD-10-CM

## 2018-07-23 MED ORDER — SILDENAFIL CITRATE 100 MG PO TABS: 50.0000 mg | ORAL_TABLET | Freq: Every day | ORAL | 2 refills | Status: DC | PRN

## 2018-07-23 NOTE — Telephone Encounter (Signed)
Called pharmacy and cancel Viagra with the 11 refills

## 2018-08-24 ENCOUNTER — Telehealth: Payer: Self-pay

## 2018-08-24 DIAGNOSIS — E109 Type 1 diabetes mellitus without complications: Secondary | ICD-10-CM

## 2018-08-24 MED ORDER — INSULIN GLARGINE 100 UNIT/ML ~~LOC~~ SOLN: 30.0000 [IU] | Freq: Every day | SUBCUTANEOUS | 11 refills | Status: DC

## 2018-08-24 NOTE — Telephone Encounter (Signed)
[facility]

## 2018-08-25 MED FILL — NovoLOG 100 UNIT/ML SOLN: 100 | 28 days supply | Qty: 10 | Fill #1

## 2018-09-18 ENCOUNTER — Ambulatory Visit: Payer: Self-pay | Admitting: Family Medicine

## 2018-09-23 ENCOUNTER — Ambulatory Visit: Payer: Self-pay | Admitting: Family Medicine

## 2018-09-29 ENCOUNTER — Ambulatory Visit: Payer: Self-pay | Admitting: Family Medicine

## 2018-10-19 ENCOUNTER — Ambulatory Visit: Payer: Self-pay | Admitting: Family Medicine

## 2018-10-30 ENCOUNTER — Encounter: Payer: Self-pay | Admitting: Family Medicine

## 2018-10-30 ENCOUNTER — Ambulatory Visit (INDEPENDENT_AMBULATORY_CARE_PROVIDER_SITE_OTHER): Payer: Self-pay | Admitting: Family Medicine

## 2018-10-30 VITALS — BP 129/83 | HR 107 | Temp 99.0°F | Resp 14 | Ht 71.0 in | Wt 169.0 lb

## 2018-10-30 DIAGNOSIS — R7309 Other abnormal glucose: Secondary | ICD-10-CM

## 2018-10-30 DIAGNOSIS — N529 Male erectile dysfunction, unspecified: Secondary | ICD-10-CM

## 2018-10-30 DIAGNOSIS — E109 Type 1 diabetes mellitus without complications: Secondary | ICD-10-CM

## 2018-10-30 DIAGNOSIS — Z09 Encounter for follow-up examination after completed treatment for conditions other than malignant neoplasm: Secondary | ICD-10-CM

## 2018-10-30 LAB — POCT GLYCOSYLATED HEMOGLOBIN (HGB A1C): Hemoglobin A1C: 7.9 % — AB (ref 4.0–5.6)

## 2018-10-30 LAB — POCT URINALYSIS DIPSTICK
Bilirubin, UA: NEGATIVE
Blood, UA: NEGATIVE
Glucose, UA: POSITIVE — AB
Ketones, UA: 15
Leukocytes, UA: NEGATIVE
Nitrite, UA: NEGATIVE
Protein, UA: NEGATIVE
Spec Grav, UA: 1.015 (ref 1.010–1.025)
Urobilinogen, UA: 0.2 E.U./dL
pH, UA: 5.5 (ref 5.0–8.0)

## 2018-10-30 LAB — GLUCOSE, POCT (MANUAL RESULT ENTRY): POC Glucose: 364 mg/dl — AB (ref 70–99)

## 2018-10-30 MED ORDER — SILDENAFIL CITRATE 100 MG PO TABS: 50.0000 mg | ORAL_TABLET | Freq: Every day | ORAL | 3 refills | Status: DC | PRN

## 2018-10-30 MED ORDER — INSULIN ASPART 100 UNIT/ML ~~LOC~~ SOLN: SUBCUTANEOUS | 11 refills | Status: DC

## 2018-10-30 MED ORDER — INSULIN GLARGINE 100 UNIT/ML ~~LOC~~ SOLN: 30.0000 [IU] | Freq: Every day | SUBCUTANEOUS | 11 refills | Status: DC

## 2018-10-30 NOTE — Progress Notes (Signed)
ADVISED PATIENT WE WOULD NEED TO GIVE HIM INSULIN SINCE HIS BLOOD SUGAR WAS OVER 250. PATIENT REFUSED, STATES "I AM GOING STRAIGHT HOME I WILL TAKE CARE OF IT WHEN I GET THERE"

## 2018-10-30 NOTE — Progress Notes (Signed)
Follow Up  Subjective:    Patient ID: Zachary Vazquez, male    DOB: [birth year], 33 y.o.   MRN: [id number]   Chief Complaint  Patient presents with  . Diabetes   HPI  Zachary Vazquez is a 33 year old male with a past medical history of Diabetes Type 1.  He is here today for follow up.   Current Status: Since his last office visit, he is doing well with no complaints. He states that he has not taken his insulin with breakfast or lunch today. He report frequent urination. He denies blurred vision, excessive hunger, excessive thirst, weight gain, weight loss, and poor wound healing.   He denies fevers, chills, fatigue, recent infections, weight loss, and night sweats. He has not had any headaches, visual changes, dizziness, and falls. No chest pain, heart palpitations, cough and shortness of breath reported. No reports of GI problems such as nausea, vomiting, diarrhea, and constipation. He has no reports of blood in stools, dysuria and hematuria. No depression or anxiety reported. He denies pain today.   Review of Systems  Constitutional: Negative.   HENT: Negative.   Eyes: Negative.   Respiratory: Negative.   Cardiovascular: Negative.   Gastrointestinal: Negative.   Endocrine: Negative.   Genitourinary: Negative.   Musculoskeletal: Negative.   Skin: Negative.   Allergic/Immunologic: Negative.   Neurological: Negative.   Hematological: Negative.   Psychiatric/Behavioral: Negative.    Objective:   Physical Exam Vitals signs and nursing note reviewed.  Constitutional:      Appearance: Normal appearance. He is normal weight.  HENT:     Head: Normocephalic and atraumatic.     Right Ear: External ear normal.     Left Ear: External ear normal.     Nose: Nose normal.     Mouth/Throat:     Mouth: Mucous membranes are moist.     Pharynx: Oropharynx is clear.  Eyes:     Conjunctiva/sclera: Conjunctivae normal.  Neck:     Musculoskeletal: Normal range of motion and neck supple.   Cardiovascular:     Rate and Rhythm: Normal rate and regular rhythm.     Pulses: Normal pulses.     Heart sounds: Normal heart sounds.  Pulmonary:     Effort: Pulmonary effort is normal.     Breath sounds: Normal breath sounds.  Abdominal:     General: Abdomen is flat. Bowel sounds are normal.     Palpations: Abdomen is soft.  Musculoskeletal: Normal range of motion.  Skin:    General: Skin is warm and dry.     Capillary Refill: Capillary refill takes less than 2 seconds.  Neurological:     General: No focal deficit present.     Mental Status: He is alert and oriented to person, place, and time.  Psychiatric:        Mood and Affect: Mood normal.        Behavior: Behavior normal.        Thought Content: Thought content normal.        Judgment: Judgment normal.    Assessment & Plan:   1. Type 1 diabetes mellitus without complication (HCC) Hgb A1c is improved at 7.9, from 9.4 on 07/22/2018. He will continue insulin as prescribed. He will continue to decrease foods/beverages high in sugars and carbs and follow Heart Healthy or DASH diet. Increase physical activity to at least 30 minutes cardio exercise daily.  - Glucose (CBG) - HgB A1c - Urinalysis Dipstick - insulin aspart (NOVOLOG)  100 UNIT/ML injection; 5 units daily with breakfast, 7 units with lunch, 7 units with dinner. Hold short-acting insulin if blood sugar < 125.  Dispense: 10 mL; Refill: 11 - insulin glargine (LANTUS) 100 UNIT/ML injection; Inject 0.3 mLs (30 Units total) into the skin daily.  Dispense: 10 mL; Refill: 11  2. Elevated glucose Blood glucose is at 364 today. Patient refused Humalog injection in office today, and states that he will give himself dosage of insulin once he gets home.   3. Erectile dysfunction, unspecified erectile dysfunction type - sildenafil (VIAGRA) 100 MG tablet; Take 0.5-1 tablets (50-100 mg total) by mouth daily as needed for erectile dysfunction.  Dispense: 5 tablet; Refill: 3  4.  Follow up He will follow up in 3 months.   Meds ordered this encounter  Medications  . insulin aspart (NOVOLOG) 100 UNIT/ML injection    Sig: 5 units daily with breakfast, 7 units with lunch, 7 units with dinner. Hold short-acting insulin if blood sugar < 125.    Dispense:  10 mL    Refill:  11  . insulin glargine (LANTUS) 100 UNIT/ML injection    Sig: Inject 0.3 mLs (30 Units total) into the skin daily.    Dispense:  10 mL    Refill:  11  . sildenafil (VIAGRA) 100 MG tablet    Sig: Take 0.5-1 tablets (50-100 mg total) by mouth daily as needed for erectile dysfunction.    Dispense:  5 tablet    Refill:  3   Raliegh IpNatalie Alphonso Gregson,  MSN, FNP-C [facility] [address]  [place], [place] [id number] [phone]

## 2018-11-02 MED FILL — !LANTUS 100 UNITS/ML VIAL: 100 | 30 days supply | Qty: 10 | Fill #0

## 2018-11-02 MED FILL — SILDENAFIL CITRATE 100 MG T: 100 | 15 days supply | Qty: 5 | Fill #0

## 2018-11-02 MED FILL — !NOVOLOG 100UNITS/ML VIAL: 100/ML | 28 days supply | Qty: 10 | Fill #2

## 2018-12-15 ENCOUNTER — Telehealth: Payer: Self-pay

## 2018-12-15 DIAGNOSIS — E109 Type 1 diabetes mellitus without complications: Secondary | ICD-10-CM

## 2018-12-15 MED ORDER — INSULIN GLARGINE 100 UNIT/ML ~~LOC~~ SOLN: 30.0000 [IU] | Freq: Every day | SUBCUTANEOUS | 11 refills | Status: DC

## 2018-12-15 MED ORDER — INSULIN ASPART 100 UNIT/ML ~~LOC~~ SOLN: SUBCUTANEOUS | 11 refills | Status: DC

## 2018-12-15 NOTE — Telephone Encounter (Signed)
Patient notified

## 2018-12-16 MED FILL — !NOVOLOG 100UNITS/ML VIAL: 100/ML | 28 days supply | Qty: 10 | Fill #0

## 2018-12-16 MED FILL — $LANTUS 100 UNITS/ML VIAL: 100 | 84 days supply | Qty: 30 | Fill #1

## 2019-01-29 ENCOUNTER — Ambulatory Visit: Payer: Self-pay | Admitting: Family Medicine

## 2019-03-12 ENCOUNTER — Ambulatory Visit: Payer: Self-pay | Admitting: Family Medicine

## 2019-03-26 ENCOUNTER — Telehealth: Payer: Self-pay

## 2019-03-26 NOTE — Telephone Encounter (Signed)
Patient advise that the insulin has 11 refills so a new script is not needed at this time

## 2019-03-31 MED FILL — $novoLOG 100 UNITS/ML VIAL: 100 | 84 days supply | Qty: 30 | Fill #1

## 2019-03-31 MED FILL — $LANTUS 100 UNITS/ML VIAL: 100 | 84 days supply | Qty: 30 | Fill #2

## 2019-04-02 ENCOUNTER — Other Ambulatory Visit: Payer: Self-pay

## 2019-04-02 ENCOUNTER — Ambulatory Visit (INDEPENDENT_AMBULATORY_CARE_PROVIDER_SITE_OTHER): Payer: Self-pay | Admitting: Family Medicine

## 2019-04-02 ENCOUNTER — Encounter: Payer: Self-pay | Admitting: Family Medicine

## 2019-04-02 VITALS — BP 128/78 | HR 82 | Temp 98.8°F | Wt 185.8 lb

## 2019-04-02 DIAGNOSIS — E109 Type 1 diabetes mellitus without complications: Secondary | ICD-10-CM

## 2019-04-02 DIAGNOSIS — R110 Nausea: Secondary | ICD-10-CM

## 2019-04-02 DIAGNOSIS — F17200 Nicotine dependence, unspecified, uncomplicated: Secondary | ICD-10-CM

## 2019-04-02 DIAGNOSIS — Z09 Encounter for follow-up examination after completed treatment for conditions other than malignant neoplasm: Secondary | ICD-10-CM

## 2019-04-02 LAB — POCT GLYCOSYLATED HEMOGLOBIN (HGB A1C): Hemoglobin A1C: 7.6 % — AB (ref 4.0–5.6)

## 2019-04-02 NOTE — Progress Notes (Signed)
[facility]   Established Patient Office Visit  Subjective:  Patient ID: Zachary Vazquez, male    DOB: 03/01/1986  Age: 33 y.o. MRN: [id number]  CC: No chief complaint on file.   HPI Zachary OberBrandon Hart is a 33 year old male who presents for Follow Up today.   Past Medical History:  Diagnosis Date  . Type 1 diabetes mellitus (HCC)    Current Status: Since hIs last office visit, he is doing well with no complaints. He has not been checking his blood glucose levels regularly. He states that he can tell when his glucose levels are out of normal range. He states that she takes his medications as prescribed. He denies fatigue, frequent urination, blurred vision, excessive hunger, excessive thirst, weight gain, weight loss, and poor wound healing. He continues to check his feet regularly.   He denies fevers, chills, recent infections, weight loss, and night sweats. He has not had any headaches, dizziness, and falls. No chest pain, heart palpitations, cough and shortness of breath reported. No reports of GI problems such as nausea, vomiting, diarrhea, and constipation. He has no reports of blood in stools, dysuria and hematuria. No depression or anxiety reported. He denies pain today.   Past Surgical History:  Procedure Laterality Date  . TONSILLECTOMY      Family History  Problem Relation Age of Onset  . Hypertension Mother   . Diabetes Mellitus I Brother     Social History   Socioeconomic History  . Marital status: Single    Spouse name: Not on file  . Number of children: Not on file  . Years of education: Not on file  . Highest education level: Not on file  Occupational History  . Not on file  Social Needs  . Financial resource strain: Not on file  . Food insecurity:    Worry: Not on file    Inability: Not on file  . Transportation needs:    Medical: Not on file    Non-medical: Not on file  Tobacco Use  . Smoking status:  Current Every Day Smoker    Packs/day: 1.00    Years: 15.00    Pack years: 15.00    Types: Cigarettes  . Smokeless tobacco: Never Used  Substance and Sexual Activity  . Alcohol use: No  . Drug use: Yes    Types: Marijuana    Comment: occ  . Sexual activity: Yes    Birth control/protection: None  Lifestyle  . Physical activity:    Days per week: Not on file    Minutes per session: Not on file  . Stress: Not on file  Relationships  . Social connections:    Talks on phone: Not on file    Gets together: Not on file    Attends religious service: Not on file    Active member of club or organization: Not on file    Attends meetings of clubs or organizations: Not on file    Relationship status: Not on file  . Intimate partner violence:    Fear of current or ex partner: Not on file    Emotionally abused: Not on file    Physically abused: Not on file    Forced sexual activity: Not on file  Other Topics Concern  . Not on file  Social History Narrative  . Not on file    Outpatient Medications Prior to Visit  Medication Sig Dispense Refill  . insulin aspart (NOVOLOG)  100 UNIT/ML injection 5 units daily with breakfast, 7 units with lunch, 7 units with dinner. Hold short-acting insulin if blood sugar < 125. 10 mL 11  . insulin glargine (LANTUS) 100 UNIT/ML injection Inject 0.3 mLs (30 Units total) into the skin daily. 10 mL 11  . sildenafil (VIAGRA) 100 MG tablet Take 0.5-1 tablets (50-100 mg total) by mouth daily as needed for erectile dysfunction. 5 tablet 3   No facility-administered medications prior to visit.     Allergies  Allergen Reactions  . Clindamycin/Lincomycin Anaphylaxis    ROS Review of Systems  Constitutional: Negative.   HENT: Negative.   Eyes: Negative.   Respiratory: Negative.   Cardiovascular: Negative.   Gastrointestinal: Negative.   Endocrine: Negative.   Genitourinary: Negative.   Musculoskeletal: Negative.   Skin: Negative.    Allergic/Immunologic: Negative.   Neurological: Negative.   Hematological: Negative.   Psychiatric/Behavioral: Negative.       Objective:    Physical Exam  Constitutional: He is oriented to person, place, and time. He appears well-developed and well-nourished.  HENT:  Head: Normocephalic and atraumatic.  Eyes: Conjunctivae are normal.  Neck: Normal range of motion. Neck supple.  Cardiovascular: Normal rate, regular rhythm, normal heart sounds and intact distal pulses.  Pulmonary/Chest: Effort normal and breath sounds normal.  Abdominal: Soft. Bowel sounds are normal.  Musculoskeletal: Normal range of motion.  Neurological: He is alert and oriented to person, place, and time. He has normal reflexes.  Skin: Skin is warm and dry.  Psychiatric: He has a normal mood and affect. His behavior is normal. Judgment and thought content normal.  Nursing note and vitals reviewed.   BP 128/78   Pulse 82   Temp 98.8 F (37.1 C)   Wt 185 lb 12.8 oz (84.3 kg)   SpO2 99%   BMI 25.91 kg/m  Wt Readings from Last 3 Encounters:  04/02/19 185 lb 12.8 oz (84.3 kg)  10/30/18 169 lb (76.7 kg)  07/22/18 163 lb (73.9 kg)     Health Maintenance Due  Topic Date Due  . PNEUMOCOCCAL POLYSACCHARIDE VACCINE AGE 51-64 HIGH RISK  04/04/1989  . OPHTHALMOLOGY EXAM  04/04/1997  . TETANUS/TDAP  04/04/2006  . FOOT EXAM  12/22/2018  . URINE MICROALBUMIN  12/22/2018    There are no preventive care reminders to display for this patient.  Lab Results  Component Value Date   TSH 2.410 12/22/2017   Lab Results  Component Value Date   WBC 7.1 12/22/2017   HGB 16.1 12/22/2017   HCT 47.4 12/22/2017   MCV 92 12/22/2017   PLT 290 12/22/2017   Lab Results  Component Value Date   NA 140 12/22/2017   K 5.2 12/22/2017   CO2 25 12/22/2017   GLUCOSE 209 (H) 12/22/2017   BUN 20 12/22/2017   CREATININE 0.96 12/22/2017   BILITOT 0.6 12/22/2017   ALKPHOS 95 12/22/2017   AST 30 12/22/2017   ALT 47 (H)  12/22/2017   PROT 7.5 12/22/2017   ALBUMIN 4.8 12/22/2017   CALCIUM 10.4 (H) 12/22/2017   ANIONGAP 7 12/04/2017   No results found for: CHOL No results found for: HDL No results found for: LDLCALC No results found for: TRIG No results found for: CHOLHDL Lab Results  Component Value Date   HGBA1C 7.6 (A) 04/02/2019      Assessment & Plan:   1. Type 1 diabetes mellitus without complication (HCC) The current medical regimen is effective; Hgb A1c is stable at 7.6 today; continue  present plan and medications as prescribed. He will continue to decrease foods/beverages high in sugars and carbs and follow Heart Healthy or DASH diet. Increase physical activity to at least 30 minutes cardio exercise daily.  - HgB A1c  2. Nausea Stable.   3. Smoking addiction Discussed benefits of quitting smoking.   4. Follow up He will follow up in 6 months.   No orders of the defined types were placed in this encounter.   Orders Placed This Encounter  Procedures  . HgB A1c    Referral Orders  No referral(s) requested today    Raliegh Ip,  MSN, FNP-BC [facility] [address] [place], [place] [phone]    Problem List Items Addressed This Visit      Endocrine   Type 1 diabetes mellitus (HCC) - Primary   Relevant Orders   HgB A1c (Completed)    Other Visit Diagnoses    Nausea       Smoking addiction       Follow up          No orders of the defined types were placed in this encounter.   Follow-up: Return in about 6 months (around 10/02/2019).    [name], FNP

## 2019-05-03 ENCOUNTER — Other Ambulatory Visit: Payer: Self-pay

## 2019-05-03 ENCOUNTER — Encounter (HOSPITAL_COMMUNITY): Payer: Self-pay

## 2019-05-03 ENCOUNTER — Observation Stay (HOSPITAL_COMMUNITY)
Admission: EM | Admit: 2019-05-03 | Discharge: 2019-05-05 | Disposition: A | Discharge status: Home / Self Care | Payer: Self-pay | Attending: Internal Medicine | Admitting: Internal Medicine

## 2019-05-03 ENCOUNTER — Emergency Department (HOSPITAL_COMMUNITY): Payer: Self-pay

## 2019-05-03 DIAGNOSIS — F17210 Nicotine dependence, cigarettes, uncomplicated: Secondary | ICD-10-CM | POA: Insufficient documentation

## 2019-05-03 DIAGNOSIS — R9431 Abnormal electrocardiogram [ECG] [EKG]: Secondary | ICD-10-CM | POA: Insufficient documentation

## 2019-05-03 DIAGNOSIS — E860 Dehydration: Secondary | ICD-10-CM | POA: Insufficient documentation

## 2019-05-03 DIAGNOSIS — Z8249 Family history of ischemic heart disease and other diseases of the circulatory system: Secondary | ICD-10-CM | POA: Insufficient documentation

## 2019-05-03 DIAGNOSIS — Z9111 Patient's noncompliance with dietary regimen: Secondary | ICD-10-CM | POA: Insufficient documentation

## 2019-05-03 DIAGNOSIS — N179 Acute kidney failure, unspecified: Secondary | ICD-10-CM | POA: Insufficient documentation

## 2019-05-03 DIAGNOSIS — R112 Nausea with vomiting, unspecified: Secondary | ICD-10-CM

## 2019-05-03 DIAGNOSIS — Z794 Long term (current) use of insulin: Secondary | ICD-10-CM | POA: Insufficient documentation

## 2019-05-03 DIAGNOSIS — E872 Acidosis: Secondary | ICD-10-CM | POA: Insufficient documentation

## 2019-05-03 DIAGNOSIS — E1110 Type 2 diabetes mellitus with ketoacidosis without coma: Secondary | ICD-10-CM | POA: Diagnosis present

## 2019-05-03 DIAGNOSIS — Z1159 Encounter for screening for other viral diseases: Secondary | ICD-10-CM | POA: Insufficient documentation

## 2019-05-03 DIAGNOSIS — E8729 Other acidosis: Secondary | ICD-10-CM

## 2019-05-03 DIAGNOSIS — K920 Hematemesis: Secondary | ICD-10-CM

## 2019-05-03 DIAGNOSIS — E1043 Type 1 diabetes mellitus with diabetic autonomic (poly)neuropathy: Secondary | ICD-10-CM | POA: Insufficient documentation

## 2019-05-03 DIAGNOSIS — E109 Type 1 diabetes mellitus without complications: Secondary | ICD-10-CM

## 2019-05-03 DIAGNOSIS — Z79899 Other long term (current) drug therapy: Secondary | ICD-10-CM | POA: Insufficient documentation

## 2019-05-03 DIAGNOSIS — I252 Old myocardial infarction: Secondary | ICD-10-CM | POA: Insufficient documentation

## 2019-05-03 DIAGNOSIS — E1010 Type 1 diabetes mellitus with ketoacidosis without coma: Principal | ICD-10-CM | POA: Insufficient documentation

## 2019-05-03 LAB — CBC
HCT: 51.7 % (ref 39.0–52.0)
Hemoglobin: 17.5 g/dL — ABNORMAL HIGH (ref 13.0–17.0)
MCH: 31 pg (ref 26.0–34.0)
MCHC: 33.8 g/dL (ref 30.0–36.0)
MCV: 91.5 fL (ref 80.0–100.0)
Platelets: 422 10*3/uL — ABNORMAL HIGH (ref 150–400)
RBC: 5.65 MIL/uL (ref 4.22–5.81)
RDW: 11.9 % (ref 11.5–15.5)
WBC: 13.8 10*3/uL — ABNORMAL HIGH (ref 4.0–10.5)
nRBC: 0 % (ref 0.0–0.2)

## 2019-05-03 LAB — TYPE AND SCREEN
ABO/RH(D): A POS
Antibody Screen: NEGATIVE

## 2019-05-03 LAB — COMPREHENSIVE METABOLIC PANEL
ALT: 21 U/L (ref 0–44)
AST: 24 U/L (ref 15–41)
Albumin: 5 g/dL (ref 3.5–5.0)
Alkaline Phosphatase: 76 U/L (ref 38–126)
Anion gap: 25 — ABNORMAL HIGH (ref 5–15)
BUN: 16 mg/dL (ref 6–20)
CO2: 18 mmol/L — ABNORMAL LOW (ref 22–32)
Calcium: 10.1 mg/dL (ref 8.9–10.3)
Chloride: 93 mmol/L — ABNORMAL LOW (ref 98–111)
Creatinine, Ser: 1.52 mg/dL — ABNORMAL HIGH (ref 0.61–1.24)
GFR calc Af Amer: >60 mL/min (ref >60)
GFR calc non Af Amer: 60 mL/min — ABNORMAL LOW (ref >60)
Glucose, Bld: 182 mg/dL — ABNORMAL HIGH (ref 70–99)
Potassium: 4.3 mmol/L (ref 3.5–5.1)
Sodium: 136 mmol/L (ref 135–145)
Total Bilirubin: 2.3 mg/dL — ABNORMAL HIGH (ref 0.3–1.2)
Total Protein: 8.2 g/dL — ABNORMAL HIGH (ref 6.5–8.1)

## 2019-05-03 LAB — LIPASE, BLOOD: Lipase: 18 U/L (ref 11–51)

## 2019-05-03 LAB — CBG MONITORING, ED
Glucose-Capillary: 150 mg/dL — ABNORMAL HIGH (ref 70–99)
Glucose-Capillary: 167 mg/dL — ABNORMAL HIGH (ref 70–99)

## 2019-05-03 MED ORDER — ONDANSETRON HCL 4 MG/2ML IJ SOLN
INTRAMUSCULAR | Status: AC
  Filled 2019-05-03: qty 2

## 2019-05-03 MED ORDER — HALOPERIDOL LACTATE 5 MG/ML IJ SOLN
2.0000 mg | Freq: Once | INTRAMUSCULAR | Status: AC
  Administered 2019-05-03: 2 mg via INTRAVENOUS
  Filled 2019-05-03: qty 1

## 2019-05-03 MED ORDER — PANTOPRAZOLE SODIUM 40 MG IV SOLR
40.0000 mg | Freq: Once | INTRAVENOUS | Status: AC
  Administered 2019-05-03: 40 mg via INTRAVENOUS
  Filled 2019-05-03: qty 40

## 2019-05-03 MED ORDER — SODIUM CHLORIDE 0.9 % IV BOLUS
2000.0000 mL | Freq: Once | INTRAVENOUS | Status: AC
  Administered 2019-05-03: 22:00:00 2000 mL via INTRAVENOUS

## 2019-05-03 MED ORDER — ONDANSETRON HCL 4 MG/2ML IJ SOLN
4.0000 mg | Freq: Once | INTRAMUSCULAR | Status: AC
  Administered 2019-05-03: 22:00:00 4 mg via INTRAVENOUS

## 2019-05-03 NOTE — ED Triage Notes (Signed)
Pt arrives POV for eval of vomiting, abd pain and general weakness. Pt is tachy to 150s in the waiting room, vomiting blood tinged emesis. States he has been feeling poorly since Saturday. Reports compliance with meds.

## 2019-05-03 NOTE — ED Provider Notes (Signed)
[facility] Provider Note   CSN: [id number] Arrival date & time: 05/03/19  2050    History   Chief Complaint Chief Complaint  Patient presents with  . Emesis    HPI Zachary Vazquez is a 33 y.o. male.     The history is provided by the patient and medical records.  Emesis Severity:  Severe Duration:  3 days Timing:  Constant Number of daily episodes:  Innumerable Quality:  Coffee grounds Able to tolerate:  Liquids and solids Progression:  Worsening Chronicity:  Recurrent Recent urination:  Decreased Relieved by:  Nothing Worsened by:  Nothing Ineffective treatments:  None tried Associated symptoms: abdominal pain (epigastric)   Associated symptoms: no arthralgias, no chills, no cough, no diarrhea, no fever, no headaches, no myalgias, no sore throat and no URI   Risk factors: diabetes   Risk factors: no alcohol use, not pregnant, no prior abdominal surgery, no sick contacts and no suspect food intake     Past Medical History:  Diagnosis Date  . Type 1 diabetes mellitus [facility](HCC)     Patient Active Problem List   Diagnosis Date Noted  . Nausea & vomiting 05/04/2019  . DKA, type 1 (HCC) 05/04/2019  . DKA (diabetic ketoacidoses) (HCC) 05/04/2019  . Nausea and vomiting 12/02/2017  . Tobacco use disorder 03/30/2017  . Type 1 diabetes mellitus (HCC) 03/30/2017  . Leukocytosis 03/30/2017    Past Surgical History:  Procedure Laterality Date  . TONSILLECTOMY          Home Medications    Prior to Admission medications   Medication Sig Start Date End Date Taking? Authorizing Provider  insulin aspart (NOVOLOG) 100 UNIT/ML injection 5 units daily with breakfast, 7 units with lunch, 7 units with dinner. Hold short-acting insulin if blood sugar < 125. Patient taking differently: Inject 5-20 Units into the skin 3 (three) times daily as needed for high blood sugar.  12/15/18  Yes Kallie LocksStroud, Natalie M, FNP  insulin glargine (LANTUS) 100  UNIT/ML injection Inject 0.3 mLs (30 Units total) into the skin daily. 12/15/18  Yes Kallie LocksStroud, Natalie M, FNP  sildenafil (VIAGRA) 100 MG tablet Take 0.5-1 tablets (50-100 mg total) by mouth daily as needed for erectile dysfunction. 10/30/18  Yes Kallie LocksStroud, Natalie M, FNP    Family History Family History  Problem Relation Age of Onset  . Hypertension Mother   . Diabetes Mellitus I Brother     Social History Social History   Tobacco Use  . Smoking status: Current Every Day Smoker    Packs/day: 1.00    Years: 15.00    Pack years: 15.00    Types: Cigarettes  . Smokeless tobacco: Never Used  Substance Use Topics  . Alcohol use: No  . Drug use: Yes    Types: Marijuana    Comment: occ     Allergies   Clindamycin/lincomycin   Review of Systems Review of Systems  Constitutional: Positive for activity change, appetite change, diaphoresis and fatigue. Negative for chills and fever.  HENT: Negative.  Negative for sore throat.   Eyes: Negative.   Respiratory: Negative.  Negative for cough.   Cardiovascular: Negative.   Gastrointestinal: Positive for abdominal pain (epigastric), nausea and vomiting. Negative for diarrhea.  Endocrine: Negative.   Genitourinary: Negative.   Musculoskeletal: Negative.  Negative for arthralgias and myalgias.  Neurological: Negative for headaches.  All other systems reviewed and are negative.    Physical Exam Updated Vital Signs BP 101/82 (BP Location: Left Arm)  Pulse (!) 110   Temp 98.7 F (37.1 C) (Oral)   Resp 19   Ht  (1.778 m)   Wt 82 kg   SpO2 99%   BMI 25.94 kg/m   Physical Exam Vitals signs and nursing note reviewed.  Constitutional:      Appearance: He is ill-appearing and diaphoretic.  HENT:     Head: Normocephalic and atraumatic.     Nose: No congestion or rhinorrhea.     Mouth/Throat:     Mouth: Mucous membranes are dry.  Eyes:     Extraocular Movements: Extraocular movements intact.     Pupils: Pupils are equal,  round, and reactive to light.  Neck:     Musculoskeletal: Normal range of motion.  Cardiovascular:     Rate and Rhythm: Tachycardia present.     Pulses: Normal pulses.  Pulmonary:     Effort: Pulmonary effort is normal.  Abdominal:     General: There is no distension.     Tenderness: There is no abdominal tenderness.  Musculoskeletal: Normal range of motion.     Right lower leg: No edema.     Left lower leg: No edema.  Skin:    Coloration: Skin is ashen.     Comments: Cool extremities  Neurological:     General: No focal deficit present.     Mental Status: He is alert and oriented to person, place, and time.      ED Treatments / Results  Labs (all labs ordered are listed, but only abnormal results are displayed) Labs Reviewed  COMPREHENSIVE METABOLIC PANEL - Abnormal; Notable for the following components:      Result Value   Chloride 93 (*)    CO2 18 (*)    Glucose, Bld 182 (*)    Creatinine, Ser 1.52 (*)    Total Protein 8.2 (*)    Total Bilirubin 2.3 (*)    GFR calc non Af Amer 60 (*)    Anion gap 25 (*)    All other components within normal limits  CBC - Abnormal; Notable for the following components:   WBC 13.8 (*)    Hemoglobin 17.5 (*)    Platelets 422 (*)    All other components within normal limits  URINALYSIS, ROUTINE W REFLEX MICROSCOPIC - Abnormal; Notable for the following components:   Glucose, UA >=500 (*)    Ketones, ur 80 (*)    Protein, ur 30 (*)    Bacteria, UA RARE (*)    All other components within normal limits  RAPID URINE DRUG SCREEN, HOSP PERFORMED - Abnormal; Notable for the following components:   Tetrahydrocannabinol POSITIVE (*)    All other components within normal limits  LACTIC ACID, PLASMA - Abnormal; Notable for the following components:   Lactic Acid, Venous 3.6 (*)    All other components within normal limits  COMPREHENSIVE METABOLIC PANEL - Abnormal; Notable for the following components:   CO2 15 (*)    Glucose, Bld 351 (*)     Creatinine, Ser 1.73 (*)    Calcium 8.8 (*)    Total Bilirubin 2.4 (*)    GFR calc non Af Amer 51 (*)    GFR calc Af Amer 59 (*)    Anion gap 22 (*)    All other components within normal limits  CBC - Abnormal; Notable for the following components:   WBC 20.7 (*)    All other components within normal limits  RAPID URINE DRUG SCREEN, HOSP PERFORMED -  Abnormal; Notable for the following components:   Tetrahydrocannabinol POSITIVE (*)    All other components within normal limits  BASIC METABOLIC PANEL - Abnormal; Notable for the following components:   CO2 12 (*)    Glucose, Bld 284 (*)    BUN 22 (*)    Creatinine, Ser 1.69 (*)    Calcium 8.5 (*)    GFR calc non Af Amer 53 (*)    Anion gap 21 (*)    All other components within normal limits  BASIC METABOLIC PANEL - Abnormal; Notable for the following components:   CO2 15 (*)    Glucose, Bld 200 (*)    Creatinine, Ser 1.49 (*)    Calcium 8.6 (*)    All other components within normal limits  BASIC METABOLIC PANEL - Abnormal; Notable for the following components:   CO2 19 (*)    Glucose, Bld 152 (*)    Creatinine, Ser 1.31 (*)    Calcium 8.8 (*)    All other components within normal limits  HEMOGLOBIN A1C - Abnormal; Notable for the following components:   Hgb A1c MFr Bld 6.9 (*)    All other components within normal limits  GLUCOSE, CAPILLARY - Abnormal; Notable for the following components:   Glucose-Capillary 134 (*)    All other components within normal limits  GLUCOSE, CAPILLARY - Abnormal; Notable for the following components:   Glucose-Capillary 137 (*)    All other components within normal limits  GLUCOSE, CAPILLARY - Abnormal; Notable for the following components:   Glucose-Capillary 149 (*)    All other components within normal limits  GLUCOSE, CAPILLARY - Abnormal; Notable for the following components:   Glucose-Capillary 155 (*)    All other components within normal limits  GLUCOSE, CAPILLARY - Abnormal;  Notable for the following components:   Glucose-Capillary 165 (*)    All other components within normal limits  CBG MONITORING, ED - Abnormal; Notable for the following components:   Glucose-Capillary 150 (*)    All other components within normal limits  CBG MONITORING, ED - Abnormal; Notable for the following components:   Glucose-Capillary 167 (*)    All other components within normal limits  CBG MONITORING, ED - Abnormal; Notable for the following components:   Glucose-Capillary 302 (*)    All other components within normal limits  CBG MONITORING, ED - Abnormal; Notable for the following components:   Glucose-Capillary 299 (*)    All other components within normal limits  CBG MONITORING, ED - Abnormal; Notable for the following components:   Glucose-Capillary 222 (*)    All other components within normal limits  CBG MONITORING, ED - Abnormal; Notable for the following components:   Glucose-Capillary 190 (*)    All other components within normal limits  CBG MONITORING, ED - Abnormal; Notable for the following components:   Glucose-Capillary 162 (*)    All other components within normal limits  CBG MONITORING, ED - Abnormal; Notable for the following components:   Glucose-Capillary 184 (*)    All other components within normal limits  CBG MONITORING, ED - Abnormal; Notable for the following components:   Glucose-Capillary 172 (*)    All other components within normal limits  SARS CORONAVIRUS 2 (HOSPITAL ORDER, PERFORMED IN [facility])  MRSA PCR SCREENING  LIPASE, BLOOD  HIV ANTIBODY (ROUTINE TESTING W REFLEX)  TROPONIN I (HIGH SENSITIVITY)  TROPONIN I (HIGH SENSITIVITY)  BASIC METABOLIC PANEL  MAGNESIUM  TYPE AND SCREEN  ABO/RH  EKG EKG Interpretation  Date/Time:  Monday May 03 2019 21:13:56 EDT Ventricular Rate:  142 PR Interval:  130 QRS Duration: 82 QT Interval:  274 QTC Calculation: 421 R Axis:   -59 Text Interpretation:  Sinus tachycardia  Biatrial enlargement Left axis deviation Pulmonary disease pattern Septal infarct , age undetermined Abnormal ECG No significant change since last tracing Confirmed by Dorie Rank [phone]) on 05/03/2019 11:34:55 PM   Radiology Dg Abd Acute 2+v W 1v Chest  Result Date: 05/03/2019 CLINICAL DATA:  Nausea and vomiting and abdominal pain for several days EXAM: DG ABDOMEN ACUTE W/ 1V CHEST COMPARISON:  None. FINDINGS: Cardiac shadow is within normal limits. The lungs are clear bilaterally. No acute bony abnormality is noted. Scattered large and small bowel gas is noted. No obstructive changes are seen. No free air is noted. No bony abnormality is seen. IMPRESSION: No acute abnormality in the chest and abdomen. Electronically Signed   By: Inez Catalina M.D.   On: 05/03/2019 22:55    Procedures Procedures (including critical care time)  Medications Ordered in ED Medications  0.9 %  sodium chloride infusion ( Intravenous Stopped 05/04/19 0643)  0.9 %  sodium chloride infusion ( Intravenous Stopped 05/04/19 0429)  dextrose 5 %-0.45 % sodium chloride infusion ( Intravenous New Bag/Given 05/04/19 2059)  insulin regular, human (MYXREDLIN) 100 units/ 100 mL infusion (0 Units/hr Intravenous Stopped 05/04/19 1648)  ondansetron (ZOFRAN) injection 4 mg (4 mg Intravenous Given 05/04/19 0547)  metoCLOPramide (REGLAN) injection 10 mg (10 mg Intravenous Given 05/04/19 1707)  insulin glargine (LANTUS) injection 25 Units (25 Units Subcutaneous Given 05/04/19 1500)  insulin aspart (novoLOG) injection 0-15 Units (3 Units Subcutaneous Given 05/04/19 1707)  insulin aspart (novoLOG) injection 3 Units (3 Units Subcutaneous Not Given 05/04/19 1707)  sodium chloride 0.9 % bolus 2,000 mL (0 mLs Intravenous Stopped 05/04/19 0025)  ondansetron (ZOFRAN) injection 4 mg (4 mg Intravenous Given 05/03/19 2226)  pantoprazole (PROTONIX) injection 40 mg (40 mg Intravenous Given 05/03/19 2339)  haloperidol lactate (HALDOL) injection 2 mg (2 mg Intravenous Given  05/03/19 2339)  promethazine (PHENERGAN) injection 12.5 mg (12.5 mg Intravenous Given 05/04/19 0058)  sodium chloride 0.9 % bolus 1,000 mL (0 mLs Intravenous Stopped 05/04/19 0429)     Initial Impression / Assessment and Plan / ED Course  I have reviewed the triage vital signs and the nursing notes.  Pertinent labs & imaging results that were available during my care of the patient were reviewed by me and considered in my medical decision making (see chart for details).  Clinical Course as of May 03 2326  Mon May 03, 2019  2348 Anion gap(!): 25 [AH]    Clinical Course User Index [AH] Margarita Mail, PA-C       SW:FUXNATFT VS: BP 101/82 (BP Location: Left Arm)   Pulse (!) 110   Temp 98.7 F (37.1 C) (Oral)   Resp 19   Ht 5\' 10"  (1.778 m)   Wt 82 kg   SpO2 99%   BMI 25.94 kg/m   DD:UKGURKY is gathered by Patient and EMR. DDX:The emergent differential diagnosis for vomiting includes, but is not limited to ACS/MI, Boerhaave's, DKA, Intracranial Hemorrhage, Ischemic bowel, Meningitis, Sepsis, Acute radiation syndrome, Acute gastric dilation, Acetaminophen toxicity, Adrenal insufficiency, Appendicitis, Aspirin toxicity, Bowel obstruction/ileus, Carbon monoxide poisoning, Cholecystitis, CNS tumor. Digoxin toxicity, Electrolyte abnormalities, Elevated ICP, Gastric outlet obstruction, Hyperemesis gravidarum, Pancreatitis, Peritonitis, Ruptured viscus, Testicular torsion/ovarian torsion, Theophyline toxicity, Biliary colic, Cannabinoid hyperemesis syndrome, Chemotherapy, Disulfiram effect, Erythromycin, ETOH,  Gastritis, Gastroenteritis, Gastroparesis, Hepatitis, Ibuprofen, Ipecac toxicity, Labyrinthitis, Migraine, Motion sickness, Narcotic withdrawal, Thyroid, Pregnancy, Peptic ulcer disease, Renal colic, and UTI Labs: I reviewed the labs which show High anion gap acidosis this is likely due to massive dehydration and lactic acidosis although euglycemic DKA is in the differential.-elevated  creatinine with AKI, elevated glucose, bicarb of 18.  His white blood cell count is 13.8, hemoglobin at 17.5 may be from polycythemia from daily smoking versus volume contraction.  Patient's urinalysis is still pending Imaging: I personally reviewed the images (cute abdominal series) which show(s) no acute abnormalities, no evidence of small bowel obstruction EKG:  EKG Interpretation  Date/Time:  Monday May 03 2019 21:13:56 EDT Ventricular Rate:  142 PR Interval:  130 QRS Duration: 82 QT Interval:  274 QTC Calculation: 421 R Axis:   -59 Text Interpretation:  Sinus tachycardia Biatrial enlargement Left axis deviation Pulmonary disease pattern Septal infarct , age undetermined Abnormal ECG No significant change since last tracing Confirmed by Linwood DibblesKnapp, Jon [phone]) on 05/03/2019 11:34:55 PM       MDM:.Patient intractable vomiting, anion gap acidosis, no obvious small bowel obstruction on abdominal x-ray.  He has a nontender abdomen.  Patient will need admission for fluid resuscitation and management of his vomiting. Patient disposition: Admit Patient condition: unchanged. The patient appears reasonably stabilized for admission considering the current resources, flow, and capabilities available in the ED at this time, and I doubt any other [facility] further screening and/or treatment in the ED prior to admission.   Final Clinical Impressions(s) / ED Diagnoses   Final diagnoses:  Intractable vomiting with nausea, unspecified vomiting type  Hematemesis with nausea  High anion gap metabolic acidosis    ED Discharge Orders    None       Arthor CaptainHarris, Javan Gonzaga, PA-C 05/04/19 2327    Linwood DibblesKnapp, Jon, MD 05/06/19 1416

## 2019-05-04 ENCOUNTER — Encounter (HOSPITAL_COMMUNITY): Payer: Self-pay | Admitting: Internal Medicine

## 2019-05-04 DIAGNOSIS — E1010 Type 1 diabetes mellitus with ketoacidosis without coma: Secondary | ICD-10-CM | POA: Diagnosis present

## 2019-05-04 DIAGNOSIS — E109 Type 1 diabetes mellitus without complications: Secondary | ICD-10-CM

## 2019-05-04 DIAGNOSIS — R112 Nausea with vomiting, unspecified: Secondary | ICD-10-CM | POA: Diagnosis present

## 2019-05-04 DIAGNOSIS — E1110 Type 2 diabetes mellitus with ketoacidosis without coma: Secondary | ICD-10-CM | POA: Diagnosis present

## 2019-05-04 LAB — MRSA PCR SCREENING: MRSA by PCR: NEGATIVE

## 2019-05-04 LAB — BASIC METABOLIC PANEL
Anion gap: 21 — ABNORMAL HIGH (ref 5–15)
Anion gap: 15 (ref 5–15)
Anion gap: 12 (ref 5–15)
BUN: 22 mg/dL — ABNORMAL HIGH (ref 6–20)
BUN: 18 mg/dL (ref 6–20)
BUN: 16 mg/dL (ref 6–20)
CO2: 12 mmol/L — ABNORMAL LOW (ref 22–32)
CO2: 15 mmol/L — ABNORMAL LOW (ref 22–32)
CO2: 19 mmol/L — ABNORMAL LOW (ref 22–32)
Calcium: 8.5 mg/dL — ABNORMAL LOW (ref 8.9–10.3)
Calcium: 8.6 mg/dL — ABNORMAL LOW (ref 8.9–10.3)
Calcium: 8.8 mg/dL — ABNORMAL LOW (ref 8.9–10.3)
Chloride: 104 mmol/L (ref 98–111)
Chloride: 108 mmol/L (ref 98–111)
Chloride: 106 mmol/L (ref 98–111)
Creatinine, Ser: 1.69 mg/dL — ABNORMAL HIGH (ref 0.61–1.24)
Creatinine, Ser: 1.49 mg/dL — ABNORMAL HIGH (ref 0.61–1.24)
Creatinine, Ser: 1.31 mg/dL — ABNORMAL HIGH (ref 0.61–1.24)
GFR calc Af Amer: >60 mL/min (ref >60)
GFR calc Af Amer: >60 mL/min (ref >60)
GFR calc Af Amer: >60 mL/min (ref >60)
GFR calc non Af Amer: 53 mL/min — ABNORMAL LOW (ref >60)
GFR calc non Af Amer: >60 mL/min (ref >60)
GFR calc non Af Amer: >60 mL/min (ref >60)
Glucose, Bld: 284 mg/dL — ABNORMAL HIGH (ref 70–99)
Glucose, Bld: 200 mg/dL — ABNORMAL HIGH (ref 70–99)
Glucose, Bld: 152 mg/dL — ABNORMAL HIGH (ref 70–99)
Potassium: 4.7 mmol/L (ref 3.5–5.1)
Potassium: 4.7 mmol/L (ref 3.5–5.1)
Potassium: 4.3 mmol/L (ref 3.5–5.1)
Sodium: 137 mmol/L (ref 135–145)
Sodium: 138 mmol/L (ref 135–145)
Sodium: 137 mmol/L (ref 135–145)

## 2019-05-04 LAB — COMPREHENSIVE METABOLIC PANEL
ALT: 21 U/L (ref 0–44)
AST: 20 U/L (ref 15–41)
Albumin: 4 g/dL (ref 3.5–5.0)
Alkaline Phosphatase: 62 U/L (ref 38–126)
Anion gap: 22 — ABNORMAL HIGH (ref 5–15)
BUN: 20 mg/dL (ref 6–20)
CO2: 15 mmol/L — ABNORMAL LOW (ref 22–32)
Calcium: 8.8 mg/dL — ABNORMAL LOW (ref 8.9–10.3)
Chloride: 99 mmol/L (ref 98–111)
Creatinine, Ser: 1.73 mg/dL — ABNORMAL HIGH (ref 0.61–1.24)
GFR calc Af Amer: 59 mL/min — ABNORMAL LOW (ref >60)
GFR calc non Af Amer: 51 mL/min — ABNORMAL LOW (ref >60)
Glucose, Bld: 351 mg/dL — ABNORMAL HIGH (ref 70–99)
Potassium: 5 mmol/L (ref 3.5–5.1)
Sodium: 136 mmol/L (ref 135–145)
Total Bilirubin: 2.4 mg/dL — ABNORMAL HIGH (ref 0.3–1.2)
Total Protein: 6.5 g/dL (ref 6.5–8.1)

## 2019-05-04 LAB — URINALYSIS, ROUTINE W REFLEX MICROSCOPIC
Bilirubin Urine: NEGATIVE
Glucose, UA: >=500 mg/dL — AB
Hgb urine dipstick: NEGATIVE
Ketones, ur: 80 mg/dL — AB
Leukocytes,Ua: NEGATIVE
Nitrite: NEGATIVE
Protein, ur: 30 mg/dL — AB
Specific Gravity, Urine: 1.022 (ref 1.005–1.030)
pH: 5 (ref 5.0–8.0)

## 2019-05-04 LAB — TROPONIN I (HIGH SENSITIVITY)
Troponin I (High Sensitivity): 10 ng/L (ref <18)
Troponin I (High Sensitivity): 11 ng/L (ref <18)

## 2019-05-04 LAB — GLUCOSE, CAPILLARY
Glucose-Capillary: 134 mg/dL — ABNORMAL HIGH (ref 70–99)
Glucose-Capillary: 137 mg/dL — ABNORMAL HIGH (ref 70–99)
Glucose-Capillary: 149 mg/dL — ABNORMAL HIGH (ref 70–99)
Glucose-Capillary: 155 mg/dL — ABNORMAL HIGH (ref 70–99)
Glucose-Capillary: 165 mg/dL — ABNORMAL HIGH (ref 70–99)

## 2019-05-04 LAB — RAPID URINE DRUG SCREEN, HOSP PERFORMED
Amphetamines: NOT DETECTED
Amphetamines: NOT DETECTED
Barbiturates: NOT DETECTED
Barbiturates: NOT DETECTED
Benzodiazepines: NOT DETECTED
Benzodiazepines: NOT DETECTED
Cocaine: NOT DETECTED
Cocaine: NOT DETECTED
Opiates: NOT DETECTED
Opiates: NOT DETECTED
Tetrahydrocannabinol: POSITIVE — AB
Tetrahydrocannabinol: POSITIVE — AB

## 2019-05-04 LAB — CBG MONITORING, ED
Glucose-Capillary: 302 mg/dL — ABNORMAL HIGH (ref 70–99)
Glucose-Capillary: 299 mg/dL — ABNORMAL HIGH (ref 70–99)
Glucose-Capillary: 222 mg/dL — ABNORMAL HIGH (ref 70–99)
Glucose-Capillary: 190 mg/dL — ABNORMAL HIGH (ref 70–99)
Glucose-Capillary: 162 mg/dL — ABNORMAL HIGH (ref 70–99)
Glucose-Capillary: 184 mg/dL — ABNORMAL HIGH (ref 70–99)
Glucose-Capillary: 172 mg/dL — ABNORMAL HIGH (ref 70–99)

## 2019-05-04 LAB — CBC
HCT: 44.4 % (ref 39.0–52.0)
Hemoglobin: 14.6 g/dL (ref 13.0–17.0)
MCH: 30.9 pg (ref 26.0–34.0)
MCHC: 32.9 g/dL (ref 30.0–36.0)
MCV: 93.9 fL (ref 80.0–100.0)
Platelets: 319 10*3/uL (ref 150–400)
RBC: 4.73 MIL/uL (ref 4.22–5.81)
RDW: 11.9 % (ref 11.5–15.5)
WBC: 20.7 10*3/uL — ABNORMAL HIGH (ref 4.0–10.5)
nRBC: 0 % (ref 0.0–0.2)

## 2019-05-04 LAB — LACTIC ACID, PLASMA: Lactic Acid, Venous: 3.6 mmol/L (ref 0.5–1.9)

## 2019-05-04 LAB — SARS CORONAVIRUS 2 BY RT PCR (HOSPITAL ORDER, PERFORMED IN ~~LOC~~ HOSPITAL LAB): SARS Coronavirus 2: NEGATIVE

## 2019-05-04 LAB — HIV ANTIBODY (ROUTINE TESTING W REFLEX): HIV Screen 4th Generation wRfx: NONREACTIVE

## 2019-05-04 LAB — ABO/RH: ABO/RH(D): A POS

## 2019-05-04 LAB — HEMOGLOBIN A1C
Hgb A1c MFr Bld: 6.9 % — ABNORMAL HIGH (ref 4.8–5.6)
Mean Plasma Glucose: 151.33 mg/dL

## 2019-05-04 MED ORDER — METOCLOPRAMIDE HCL 5 MG/ML IJ SOLN
10.0000 mg | Freq: Four times a day (QID) | INTRAMUSCULAR | Status: DC
  Administered 2019-05-04 – 2019-05-05 (×4): 10 mg via INTRAVENOUS
  Filled 2019-05-04 (×4): qty 2

## 2019-05-04 MED ORDER — INSULIN ASPART 100 UNIT/ML ~~LOC~~ SOLN
0.0000 [IU] | Freq: Three times a day (TID) | SUBCUTANEOUS | Status: DC
  Administered 2019-05-04: 3 [IU] via SUBCUTANEOUS
  Administered 2019-05-05: 5 [IU] via SUBCUTANEOUS

## 2019-05-04 MED ORDER — INSULIN REGULAR(HUMAN) IN NACL 100-0.9 UT/100ML-% IV SOLN
INTRAVENOUS | Status: DC
  Administered 2019-05-04: 04:00:00 2.4 [IU]/h via INTRAVENOUS
  Filled 2019-05-04: qty 100

## 2019-05-04 MED ORDER — ONDANSETRON HCL 4 MG/2ML IJ SOLN
4.0000 mg | Freq: Three times a day (TID) | INTRAMUSCULAR | Status: DC | PRN
  Administered 2019-05-04: 06:00:00 4 mg via INTRAVENOUS
  Filled 2019-05-04: qty 2

## 2019-05-04 MED ORDER — METOCLOPRAMIDE HCL 5 MG/ML IJ SOLN
10.0000 mg | Freq: Three times a day (TID) | INTRAMUSCULAR | Status: DC | PRN
  Administered 2019-05-04: 08:00:00 10 mg via INTRAVENOUS
  Filled 2019-05-04: qty 2

## 2019-05-04 MED ORDER — PROMETHAZINE HCL 25 MG/ML IJ SOLN
12.5000 mg | Freq: Once | INTRAMUSCULAR | Status: AC
  Administered 2019-05-04: 01:00:00 12.5 mg via INTRAVENOUS
  Filled 2019-05-04: qty 1

## 2019-05-04 MED ORDER — INSULIN ASPART 100 UNIT/ML ~~LOC~~ SOLN: 3.0000 [IU] | Freq: Three times a day (TID) | SUBCUTANEOUS | Status: DC

## 2019-05-04 MED ORDER — DEXTROSE-NACL 5-0.45 % IV SOLN
INTRAVENOUS | Status: DC
  Administered 2019-05-04 – 2019-05-05 (×3): via INTRAVENOUS

## 2019-05-04 MED ORDER — SODIUM CHLORIDE 0.9 % IV SOLN
INTRAVENOUS | Status: DC
  Administered 2019-05-04: 04:00:00 via INTRAVENOUS

## 2019-05-04 MED ORDER — INSULIN GLARGINE 100 UNIT/ML ~~LOC~~ SOLN
25.0000 [IU] | Freq: Every day | SUBCUTANEOUS | Status: DC
  Administered 2019-05-04 – 2019-05-05 (×2): 25 [IU] via SUBCUTANEOUS
  Filled 2019-05-04 (×2): qty 0.25

## 2019-05-04 MED ORDER — ENOXAPARIN SODIUM 40 MG/0.4ML ~~LOC~~ SOLN
40.0000 mg | Freq: Every day | SUBCUTANEOUS | Status: DC
  Filled 2019-05-04: qty 0.4

## 2019-05-04 MED ORDER — SODIUM CHLORIDE 0.9 % IV SOLN
INTRAVENOUS | Status: AC
  Administered 2019-05-04: 02:00:00 via INTRAVENOUS

## 2019-05-04 MED ORDER — SODIUM CHLORIDE 0.9 % IV BOLUS
1000.0000 mL | Freq: Once | INTRAVENOUS | Status: AC
  Administered 2019-05-04: 1000 mL via INTRAVENOUS

## 2019-05-04 NOTE — Progress Notes (Signed)
Inpatient Diabetes Program Recommendations  AACE/ADA: New Consensus Statement on Inpatient Glycemic Control (2015)  Target Ranges:  Prepandial:   less than 140 mg/dL      Peak postprandial:   less than 180 mg/dL (1-2 hours)      Critically ill patients:  140 - 180 mg/dL   Lab Results  Component Value Date   GLUCAP 137 (H) 05/04/2019   HGBA1C 6.9 (H) 05/04/2019    Review of Glycemic Control Results for Zachary Vazquez, Zachary Vazquez (MRN [id number]) as of 05/04/2019 14:00  Ref. Range 05/04/2019 08:49 05/04/2019 10:02 05/04/2019 11:10 05/04/2019 12:31 05/04/2019 13:45  Glucose-Capillary Latest Ref Range: 70 - 99 mg/dL 162 (H) 184 (H) 172 (H) 134 (H) 137 (H)   Diabetes history: DM 1 Outpatient Diabetes medications:  Lantus 30 units daily, Novolog 5 units with breakfast, 7 units with lunch, and 7 units with dinner Current orders for Inpatient glycemic control:  IV insulin- DKA  Inpatient Diabetes Program Recommendations:   Note that A1C=6.9% which is really great glycemic control pre-hospitalization.  Patient was hospitalized with DKA in February of last year and A1C=11.3%.   He has been seeing MD at [facility] and wellness.  Unsure of what precipitated DKA.  Once acidosis is cleared and AG closed, consider Lantus 25 units daily, Novolog sensitive tid with meals and HS, and Novolog 5 units tid with meals.  -Will see patient on 7/8 to discuss DKA, etc.   Thanks , Adah Perl, RN, BC-ADM Inpatient Diabetes Coordinator Pager [phone]

## 2019-05-04 NOTE — Progress Notes (Signed)
Patient admitted for DKA this morning.  He is also having intractable nausea and vomiting likely from gastroparesis.  Zofran thus far has not really worked much for him.  Vital signs are stable.  Slight distress due to feeling of nausea.  Some clinical signs of dehydration with dry mucous membrane.  This morning his anion gap is closed. We will prescribe Lantus 25 units right now.  I also spoke with the nurse to discontinue insulin drip in about 1 and half-2 hours from the time of administration of subcu insulin.  Will add insulin sliding scale and pre-meal coverage. Will require modified diet if he tolerates.  Reglan along with Zofran for nausea.  Should also help with his gastroparesis.  If he has enough oral intake and blood sugar maintains an adequate level, will discontinue IV fluids.  Spoke with the patient's RN.  Call with further questions as needed.  Gerlean Ren MD [facility]

## 2019-05-04 NOTE — ED Notes (Signed)
ED TO INPATIENT HANDOFF REPORT  ED Nurse Name and Phone #: Britta Mccreedy RN  S Name/Age/Gender Gwenyth Ober 33 y.o. male Room/Bed: 030C/030C  Code Status   Code Status: Full Code  Home/SNF/Other Home Patient oriented to: self Is this baseline? Yes   Triage Complete: Triage complete  Chief Complaint Ketoacidosis  Triage Note Pt arrives POV for eval of vomiting, abd pain and general weakness. Pt is tachy to 150s in the waiting room, vomiting blood tinged emesis. States he has been feeling poorly since Saturday. Reports compliance with meds.    Allergies Allergies  Allergen Reactions  . Clindamycin/Lincomycin Anaphylaxis    Level of Care/Admitting Diagnosis ED Disposition    ED Disposition Condition Comment   Admit  Hospital Area: [facility] [[id number]]  Level of Care: Progressive [102]  I expect the patient will be discharged within 24 hours: No (not a candidate for 5C-Observation unit)  Covid Evaluation: N/A  Diagnosis: DKA (diabetic ketoacidoses) [facility]) [[id number]]  Admitting Physician: Eduard Clos [phone]  Attending Physician: Eduard Clos Florian.Pax  PT Class (Do Not Modify): Observation [104]  PT Acc Code (Do Not Modify): Observation [10022]       B Medical/Surgery History Past Medical History:  Diagnosis Date  . Type 1 diabetes mellitus (HCC)    Past Surgical History:  Procedure Laterality Date  . TONSILLECTOMY       A IV Location/Drains/Wounds Patient Lines/Drains/Airways Status   Active Line/Drains/Airways    Name:   Placement date:   Placement time:   Site:   Days:   Peripheral IV 05/03/19 Right Hand   05/03/19    2215    Hand   1          Intake/Output Last 24 hours No intake or output data in the 24 hours ending 05/04/19 1100  Labs/Imaging Results for orders placed or performed during the hospital encounter of 05/03/19 (from the past 48 hour(s))  Urinalysis, Routine w reflex microscopic     Status: Abnormal    Collection Time: 05/03/19 12:53 AM  Result Value Ref Range   Color, Urine YELLOW YELLOW   APPearance CLEAR CLEAR   Specific Gravity, Urine 1.022 1.005 - 1.030   pH 5.0 5.0 - 8.0   Glucose, UA >=500 (A) NEGATIVE mg/dL   Hgb urine dipstick NEGATIVE NEGATIVE   Bilirubin Urine NEGATIVE NEGATIVE   Ketones, ur 80 (A) NEGATIVE mg/dL   Protein, ur 30 (A) NEGATIVE mg/dL   Nitrite NEGATIVE NEGATIVE   Leukocytes,Ua NEGATIVE NEGATIVE   RBC / HPF 0-5 0 - 5 RBC/hpf   WBC, UA 0-5 0 - 5 WBC/hpf   Bacteria, UA RARE (A) NONE SEEN   Mucus PRESENT    Hyaline Casts, UA PRESENT     Comment: Performed at [facility], [address], [place], [place] [address]  Rapid urine drug screen (hospital performed)     Status: Abnormal   Collection Time: 05/03/19 12:54 AM  Result Value Ref Range   Opiates NONE DETECTED NONE DETECTED   Cocaine NONE DETECTED NONE DETECTED   Benzodiazepines NONE DETECTED NONE DETECTED   Amphetamines NONE DETECTED NONE DETECTED   Tetrahydrocannabinol POSITIVE (A) NONE DETECTED   Barbiturates NONE DETECTED NONE DETECTED    Comment: (NOTE) DRUG SCREEN FOR MEDICAL PURPOSES ONLY.  IF CONFIRMATION IS NEEDED FOR ANY PURPOSE, NOTIFY LAB WITHIN 5 DAYS. LOWEST DETECTABLE LIMITS FOR URINE DRUG SCREEN Drug Class  Cutoff (ng/mL) Amphetamine and metabolites    1000 Barbiturate and metabolites    200 Benzodiazepine                 299 Tricyclics and metabolites     300 Opiates and metabolites        300 Cocaine and metabolites        300 THC                            50 Performed at [facility], [place] [address] [place], [place] [address]   CBG monitoring, ED     Status: Abnormal   Collection Time: 05/03/19  8:56 PM  Result Value Ref Range   Glucose-Capillary 150 (H) 70 - 99 mg/dL  Type and screen Dare     Status: None   Collection Time: 05/03/19  9:20 PM  Result Value Ref Range   ABO/RH(D) A POS    Antibody Screen NEG     Sample Expiration      05/06/2019,2359 Performed at [facility], [place] [address] [place], [place] [address]   ABO/Rh     Status: None (Preliminary result)   Collection Time: 05/03/19  9:20 PM  Result Value Ref Range   ABO/RH(D)      A POS Performed at [place] [address], [place], [place] [address]   CBG monitoring, ED     Status: Abnormal   Collection Time: 05/03/19  9:27 PM  Result Value Ref Range   Glucose-Capillary 167 (H) 70 - 99 mg/dL  Lipase, blood     Status: None   Collection Time: 05/03/19  9:40 PM  Result Value Ref Range   Lipase 18 11 - 51 U/L    Comment: Performed at [facility], [place] [address], [place], [place] [address]  Comprehensive metabolic panel     Status: Abnormal   Collection Time: 05/03/19  9:40 PM  Result Value Ref Range   Sodium 136 135 - 145 mmol/L   Potassium 4.3 3.5 - 5.1 mmol/L   Chloride 93 (L) 98 - 111 mmol/L   CO2 18 (L) 22 - 32 mmol/L   Glucose, Bld 182 (H) 70 - 99 mg/dL   BUN 16 6 - 20 mg/dL   Creatinine, Ser 1.52 (H) 0.61 - 1.24 mg/dL   Calcium 10.1 8.9 - 10.3 mg/dL   Total Protein 8.2 (H) 6.5 - 8.1 g/dL   Albumin 5.0 3.5 - 5.0 g/dL   AST 24 15 - 41 U/L   ALT 21 0 - 44 U/L   Alkaline Phosphatase 76 38 - 126 U/L   Total Bilirubin 2.3 (H) 0.3 - 1.2 mg/dL   GFR calc non Af Amer 60 (L) >60 mL/min   GFR calc Af Amer >60 >60 mL/min   Anion gap 25 (H) 5 - 15    Comment: Performed at [facility], [place] [address] [place] [address]  CBC     Status: Abnormal   Collection Time: 05/03/19  9:40 PM  Result Value Ref Range   WBC 13.8 (H) 4.0 - 10.5 K/uL   RBC 5.65 4.22 - 5.81 MIL/uL   Hemoglobin 17.5 (H) 13.0 - 17.0 g/dL   HCT 51.7 39.0 - 52.0 %   MCV 91.5 80.0 - 100.0 fL   MCH 31.0 26.0 - 34.0 pg   MCHC 33.8 30.0 - 36.0 g/dL  RDW 11.9 11.5 - 15.5 %   Platelets 422 (H) 150 - 400 K/uL   nRBC 0.0 0.0 - 0.2 %    Comment: Performed at [facility], [address] [place], [place] [address]   Lactic acid, plasma     Status: Abnormal   Collection Time: 05/03/19 11:53 PM  Result Value Ref Range   Lactic Acid, Venous 3.6 (HH) 0.5 - 1.9 mmol/L    Comment: CRITICAL RESULT CALLED TO, READ BACK BY AND VERIFIED WITH: [facility]T [facility],RN 05/04/19 0108 WAYK Performed at [facility], [address] [place], [place] [address]   SARS Coronavirus 2 (CEPHEID - Performed in [facility] hospital lab), Hosp Order     Status: None   Collection Time: 05/04/19 12:44 AM   Specimen: Urine, Clean Catch; Nasopharyngeal  Result Value Ref Range   SARS Coronavirus 2 NEGATIVE NEGATIVE    Comment: (NOTE) If result is NEGATIVE SARS-CoV-2 target nucleic acids are NOT DETECTED. The SARS-CoV-2 RNA is generally detectable in upper and lower  respiratory specimens during the acute phase of infection. The lowest  concentration of SARS-CoV-2 viral copies this assay can detect is 250  copies / mL. A negative result does not preclude SARS-CoV-2 infection  and should not be used as the sole basis for treatment or other  patient management decisions.  A negative result may occur with  improper specimen collection / handling, submission of specimen other  than nasopharyngeal swab, presence of viral mutation(s) within the  areas targeted by this assay, and inadequate number of viral copies  (<250 copies / mL). A negative result must be combined with clinical  observations, patient history, and epidemiological information. If result is POSITIVE SARS-CoV-2 target nucleic acids are DETECTED. The SARS-CoV-2 RNA is generally detectable in upper and lower  respiratory specimens dur ing the acute phase of infection.  Positive  results are indicative of active infection with SARS-CoV-2.  Clinical  correlation with patient history and other diagnostic information is  necessary to determine patient infection status.  Positive results do  not rule out bacterial infection or co-infection with other viruses. If result is  PRESUMPTIVE POSTIVE SARS-CoV-2 nucleic acids MAY BE PRESENT.   A presumptive positive result was obtained on the submitted specimen  and confirmed on repeat testing.  While 2019 novel coronavirus  (SARS-CoV-2) nucleic acids may be present in the submitted sample  additional confirmatory testing may be necessary for epidemiological  and / or clinical management purposes  to differentiate between  SARS-CoV-2 and other Sarbecovirus currently known to infect humans.  If clinically indicated additional testing with an alternate test  methodology [phone]([id number]) is advised. The SARS-CoV-2 RNA is generally  detectable in upper and lower respiratory sp ecimens during the acute  phase of infection. The expected result is Negative. Fact Sheet for Patients:  BoilerBrush.com.cyhttps://www.fda.gov/media/136312/download Fact Sheet for Healthcare Providers: https://pope.com/https://www.fda.gov/media/136313/download This test is not yet approved or cleared by the [place] FDA and has been authorized for detection and/or diagnosis of SARS-CoV-2 by FDA under an Emergency Use Authorization (EUA).  This EUA will remain in effect (meaning this test can be used) for the duration of the COVID-19 declaration under Section 564(b)(1) of the Act, 21 U.S.C. section 360bbb-3(b)(1), unless the authorization is terminated or revoked sooner. Performed at [facility], [address] [place], [place] [address]   Comprehensive metabolic panel     Status: Abnormal   Collection Time: 05/04/19  2:15 AM  Result Value Ref  Range   Sodium 136 135 - 145 mmol/L   Potassium 5.0 3.5 - 5.1 mmol/L   Chloride 99 98 - 111 mmol/L   CO2 15 (L) 22 - 32 mmol/L   Glucose, Bld 351 (H) 70 - 99 mg/dL   BUN 20 6 - 20 mg/dL   Creatinine, Ser 1.61 (H) 0.61 - 1.24 mg/dL   Calcium 8.8 (L) 8.9 - 10.3 mg/dL   Total Protein 6.5 6.5 - 8.1 g/dL   Albumin 4.0 3.5 - 5.0 g/dL   AST 20 15 - 41 U/L   ALT 21 0 - 44 U/L   Alkaline Phosphatase 62 38 - 126 U/L   Total  Bilirubin 2.4 (H) 0.3 - 1.2 mg/dL   GFR calc non Af Amer 51 (L) >60 mL/min   GFR calc Af Amer 59 (L) >60 mL/min   Anion gap 22 (H) 5 - 15    Comment: Performed at [facility], [address], [place], [place] [address]  Urine rapid drug screen (hosp performed)     Status: Abnormal   Collection Time: 05/04/19  3:57 AM  Result Value Ref Range   Opiates NONE DETECTED NONE DETECTED   Cocaine NONE DETECTED NONE DETECTED   Benzodiazepines NONE DETECTED NONE DETECTED   Amphetamines NONE DETECTED NONE DETECTED   Tetrahydrocannabinol POSITIVE (A) NONE DETECTED   Barbiturates NONE DETECTED NONE DETECTED    Comment: (NOTE) DRUG SCREEN FOR MEDICAL PURPOSES ONLY.  IF CONFIRMATION IS NEEDED FOR ANY PURPOSE, NOTIFY LAB WITHIN 5 DAYS. LOWEST DETECTABLE LIMITS FOR URINE DRUG SCREEN Drug Class                     Cutoff (ng/mL) Amphetamine and metabolites    1000 Barbiturate and metabolites    200 Benzodiazepine                 200 Tricyclics and metabolites     300 Opiates and metabolites        300 Cocaine and metabolites        300 THC                            50 Performed at [facility], [address], [place], [place] [address]   CBG monitoring, ED     Status: Abnormal   Collection Time: 05/04/19  4:18 AM  Result Value Ref Range   Glucose-Capillary 302 (H) 70 - 99 mg/dL  CBG monitoring, ED     Status: Abnormal   Collection Time: 05/04/19  5:14 AM  Result Value Ref Range   Glucose-Capillary 299 (H) 70 - 99 mg/dL  Basic metabolic panel     Status: Abnormal   Collection Time: 05/04/19  5:45 AM  Result Value Ref Range   Sodium 137 135 - 145 mmol/L   Potassium 4.7 3.5 - 5.1 mmol/L   Chloride 104 98 - 111 mmol/L   CO2 12 (L) 22 - 32 mmol/L   Glucose, Bld 284 (H) 70 - 99 mg/dL   BUN 22 (H) 6 - 20 mg/dL   Creatinine, Ser 1.19 (H) 0.61 - 1.24 mg/dL   Calcium 8.5 (L) 8.9 - 10.3 mg/dL   GFR calc non Af Amer 53 (L) >60 mL/min   GFR calc Af Amer >60 >60 mL/min   Anion gap  21 (H) 5 - 15    Comment: Performed at [facility], [address] [place], [place] [address]  Troponin I (High Sensitivity)     Status: None   Collection Time: 05/04/19  5:45 AM  Result Value Ref Range   Troponin I (High Sensitivity) 10 <18 ng/L    Comment: (NOTE) Elevated high sensitivity troponin I (hsTnI) values and significant  changes across serial measurements may suggest ACS but many other  chronic and acute conditions are known to elevate hsTnI results.  Refer to the Links section for chest pain algorithms and additional  guidance. Performed at [facility], [address] [place], [place] [address]   CBG monitoring, ED     Status: Abnormal   Collection Time: 05/04/19  6:36 AM  Result Value Ref Range   Glucose-Capillary 222 (H) 70 - 99 mg/dL  CBC     Status: Abnormal   Collection Time: 05/04/19  7:03 AM  Result Value Ref Range   WBC 20.7 (H) 4.0 - 10.5 K/uL   RBC 4.73 4.22 - 5.81 MIL/uL   Hemoglobin 14.6 13.0 - 17.0 g/dL   HCT 09.844.4 11.939.0 - 14.752.0 %   MCV 93.9 80.0 - 100.0 fL   MCH 30.9 26.0 - 34.0 pg   MCHC 32.9 30.0 - 36.0 g/dL   RDW 82.911.9 56.211.5 - 13.015.5 %   Platelets 319 150 - 400 K/uL   nRBC 0.0 0.0 - 0.2 %    Comment: Performed at [facility], [address] [place], [place] [address]  Troponin I (High Sensitivity)     Status: None   Collection Time: 05/04/19  7:11 AM  Result Value Ref Range   Troponin I (High Sensitivity) 11 <18 ng/L    Comment: (NOTE) Elevated high sensitivity troponin I (hsTnI) values and significant  changes across serial measurements may suggest ACS but many other  chronic and acute conditions are known to elevate hsTnI results.  Refer to the "Links" section for chest pain algorithms and additional  guidance. Performed at [facility], [address] [place], [place] [address]   CBG monitoring, ED     Status: Abnormal   Collection Time: 05/04/19  7:43 AM  Result Value Ref Range   Glucose-Capillary 190 (H) 70 -  99 mg/dL   Comment 1 Notify RN    Comment 2 Document in Chart   CBG monitoring, ED     Status: Abnormal   Collection Time: 05/04/19  8:49 AM  Result Value Ref Range   Glucose-Capillary 162 (H) 70 - 99 mg/dL   Comment 1 Notify RN    Comment 2 Document in Chart   Basic metabolic panel     Status: Abnormal   Collection Time: 05/04/19  9:40 AM  Result Value Ref Range   Sodium 138 135 - 145 mmol/L   Potassium 4.7 3.5 - 5.1 mmol/L   Chloride 108 98 - 111 mmol/L   CO2 15 (L) 22 - 32 mmol/L   Glucose, Bld 200 (H) 70 - 99 mg/dL   BUN 18 6 - 20 mg/dL   Creatinine, Ser 9.521.49 (H) 0.61 - 1.24 mg/dL   Calcium 8.6 (L) 8.9 - 10.3 mg/dL   GFR calc non Af Amer >60 >60 mL/min   GFR calc Af Amer >60 >60 mL/min   Anion gap 15 5 - 15    Comment: Performed at [facility], [address] [place], [place] [address]  Hemoglobin A1c     Status: Abnormal   Collection Time: 05/04/19  9:40 AM  Result  Value Ref Range   Hgb A1c MFr Bld 6.9 (H) 4.8 - 5.6 %    Comment: (NOTE) Pre diabetes:          5.7%-6.4% Diabetes:              >6.4% Glycemic control for   <7.0% adults with diabetes    Mean Plasma Glucose 151.33 mg/dL    Comment: Performed at [facility], [address] [place], [place] [address]  CBG monitoring, ED     Status: Abnormal   Collection Time: 05/04/19 10:02 AM  Result Value Ref Range   Glucose-Capillary 184 (H) 70 - 99 mg/dL   Dg Abd Acute 2+v W 1v Chest  Result Date: 05/03/2019 CLINICAL DATA:  Nausea and vomiting and abdominal pain for several days EXAM: DG ABDOMEN ACUTE W/ 1V CHEST COMPARISON:  None. FINDINGS: Cardiac shadow is within normal limits. The lungs are clear bilaterally. No acute bony abnormality is noted. Scattered large and small bowel gas is noted. No obstructive changes are seen. No free air is noted. No bony abnormality is seen. IMPRESSION: No acute abnormality in the chest and abdomen. Electronically Signed   By: Alcide CleverMark  Lukens M.D.   On: 05/03/2019 22:55     Pending Labs Unresulted Labs (From admission, onward)    Start     Ordered   05/11/19 0500  Creatinine, serum  (enoxaparin (LOVENOX)    CrCl >/= 30 ml/min)  Weekly,   R    Comments: while on enoxaparin therapy    05/04/19 0305   05/04/19 0533  Basic metabolic panel  Now then every 4 hours,   R (with STAT occurrences)     05/04/19 0532   05/03/19 2141  HIV Antibody (routine testing w rflx)  Once,   STAT     05/03/19 2141          Vitals/Pain Today's Vitals   05/04/19 0800 05/04/19 0900 05/04/19 0930 05/04/19 1030  BP: 124/69 110/67 108/62 (!) 102/58  Pulse: (!) 123 (!) 116 (!) 116 (!) 113  Resp: 19 19 19 20   Temp:      TempSrc:      SpO2: 98% 98% 99% 97%  Weight:      Height:      PainSc:        Isolation Precautions No active isolations  Medications Medications  0.9 %  sodium chloride infusion ( Intravenous Stopped 05/04/19 0643)  0.9 %  sodium chloride infusion ( Intravenous Stopped 05/04/19 0429)  dextrose 5 %-0.45 % sodium chloride infusion ( Intravenous New Bag/Given 05/04/19 0644)  insulin regular, human (MYXREDLIN) 100 units/ 100 mL infusion (5 Units/hr Intravenous Rate/Dose Change 05/04/19 1004)  enoxaparin (LOVENOX) injection 40 mg (has no administration in time range)  ondansetron (ZOFRAN) injection 4 mg (4 mg Intravenous Given 05/04/19 0547)  metoCLOPramide (REGLAN) injection 10 mg (10 mg Intravenous Given 05/04/19 0809)  sodium chloride 0.9 % bolus 2,000 mL (0 mLs Intravenous Stopped 05/04/19 0025)  ondansetron (ZOFRAN) injection 4 mg (4 mg Intravenous Given 05/03/19 2226)  pantoprazole (PROTONIX) injection 40 mg (40 mg Intravenous Given 05/03/19 2339)  haloperidol lactate (HALDOL) injection 2 mg (2 mg Intravenous Given 05/03/19 2339)  promethazine (PHENERGAN) injection 12.5 mg (12.5 mg Intravenous Given 05/04/19 0058)  sodium chloride 0.9 % bolus 1,000 mL (0 mLs Intravenous Stopped 05/04/19 0429)    Mobility walks Low fall risk   Focused Assessments Cardiac Assessment  Handoff:    No results found for: CKTOTAL, CKMB, CKMBINDEX,  TROPONINI No results found for: DDIMER Does the Patient currently have chest pain? No      R Recommendations: See Admitting Provider Note  Report given to:   Additional Notes:

## 2019-05-04 NOTE — ED Notes (Signed)
RN attempted to call report. Told that they were talking to  [facility] and would call back.

## 2019-05-04 NOTE — ED Notes (Signed)
Pt restful at this time without current emesis noted.

## 2019-05-04 NOTE — ED Notes (Signed)
Pt moved to hospital bed.

## 2019-05-04 NOTE — ED Notes (Signed)
RN attempted to call report. Asked if they could call me back.

## 2019-05-04 NOTE — ED Notes (Signed)
Gave pt urinal and told him if he was able to urinate then to be sure he lets [place] know. He says that he doesn't think he can because he has not been able to eat or drink since Friday

## 2019-05-04 NOTE — ED Notes (Signed)
ED TO INPATIENT HANDOFF REPORT  ED Nurse Name and Phone #: [phone] Shawna OrleansMelanie, RN  S Name/Age/Gender Zachary Vazquez 33 y.o. male Room/Bed: 030C/030C  Code Status   Code Status: Prior  Home/SNF/Other Home Patient oriented to: self, place, time and situation Is this baseline? Yes   Triage Complete: Triage complete  Chief Complaint Ketoacidosis  Triage Note Pt arrives POV for eval of vomiting, abd pain and general weakness. Pt is tachy to 150s in the waiting room, vomiting blood tinged emesis. States he has been feeling poorly since Saturday. Reports compliance with meds.    Allergies Allergies  Allergen Reactions  . Clindamycin/Lincomycin Anaphylaxis    Level of Care/Admitting Diagnosis ED Disposition    ED Disposition Condition Comment   Admit  [facility] [[id number]]  Level of Care: Progressive [102]  I expect the patient will be discharged within 24 hours: No (not a candidate for 5C-Observation unit)  Covid Evaluation: Person Under Investigation (PUI)  Diagnosis: Nausea & vomiting [277057]  Admitting Physician: Eduard ClosKAKRAKANDY, ARSHAD N [phone][[id number]]  Attending Physician: Eduard ClosKAKRAKANDY, ARSHAD N Florian.Pax[[id number]]  PT Class (Do Not Modify): Observation [104]  PT Acc Code (Do Not Modify): Observation [10022]       B Medical/Surgery History Past Medical History:  Diagnosis Date  . Type 1 diabetes mellitus (HCC)    Past Surgical History:  Procedure Laterality Date  . TONSILLECTOMY       A IV Location/Drains/Wounds Patient Lines/Drains/Airways Status   Active Line/Drains/Airways    Name:   Placement date:   Placement time:   Site:   Days:   Peripheral IV 05/03/19 Right Hand   05/03/19    2215    Hand   1          Intake/Output Last 24 hours No intake or output data in the 24 hours ending 05/04/19 0038  Labs/Imaging Results for orders placed or performed during the hospital encounter of 05/03/19 (from the past 48 hour(s))  CBG monitoring, ED      Status: Abnormal   Collection Time: 05/03/19  8:56 PM  Result Value Ref Range   Glucose-Capillary 150 (H) 70 - 99 mg/dL  Type and screen [facility]     Status: None   Collection Time: 05/03/19  9:20 PM  Result Value Ref Range   ABO/RH(D) A POS    Antibody Screen NEG    Sample Expiration      05/06/2019,2359 Performed at [facility], [address] [place], [place] [address]   ABO/Rh     Status: None (Preliminary result)   Collection Time: 05/03/19  9:20 PM  Result Value Ref Range   ABO/RH(D)      A POS Performed at [facility], [address] [place], [place] [address]   CBG monitoring, ED     Status: Abnormal   Collection Time: 05/03/19  9:27 PM  Result Value Ref Range   Glucose-Capillary 167 (H) 70 - 99 mg/dL  Lipase, blood     Status: None   Collection Time: 05/03/19  9:40 PM  Result Value Ref Range   Lipase 18 11 - 51 U/L    Comment: Performed at [facility], [address] [place], [place] [address]  Comprehensive metabolic panel     Status: Abnormal   Collection Time: 05/03/19  9:40 PM  Result Value Ref Range   Sodium 136 135 - 145 mmol/L   Potassium 4.3 3.5 - 5.1 mmol/L  Chloride 93 (L) 98 - 111 mmol/L   CO2 18 (L) 22 - 32 mmol/L   Glucose, Bld 182 (H) 70 - 99 mg/dL   BUN 16 6 - 20 mg/dL   Creatinine, Ser 6.961.52 (H) 0.61 - 1.24 mg/dL   Calcium 29.510.1 8.9 - 28.410.3 mg/dL   Total Protein 8.2 (H) 6.5 - 8.1 g/dL   Albumin 5.0 3.5 - 5.0 g/dL   AST 24 15 - 41 U/L   ALT 21 0 - 44 U/L   Alkaline Phosphatase 76 38 - 126 U/L   Total Bilirubin 2.3 (H) 0.3 - 1.2 mg/dL   GFR calc non Af Amer 60 (L) >60 mL/min   GFR calc Af Amer >60 >60 mL/min   Anion gap 25 (H) 5 - 15    Comment: Performed at [facility], [address] [place], [place] [address]  CBC     Status: Abnormal   Collection Time: 05/03/19  9:40 PM  Result Value Ref Range   WBC 13.8 (H) 4.0 - 10.5 K/uL   RBC 5.65 4.22 - 5.81 MIL/uL   Hemoglobin 17.5 (H) 13.0 - 17.0  g/dL   HCT 01.051.7 27.239.0 - 53.652.0 %   MCV 91.5 80.0 - 100.0 fL   MCH 31.0 26.0 - 34.0 pg   MCHC 33.8 30.0 - 36.0 g/dL   RDW 64.411.9 03.411.5 - 74.215.5 %   Platelets 422 (H) 150 - 400 K/uL   nRBC 0.0 0.0 - 0.2 %    Comment: Performed at [facility], [address] [place], [place] [address]   Dg Abd Acute 2+v W 1v Chest  Result Date: 05/03/2019 CLINICAL DATA:  Nausea and vomiting and abdominal pain for several days EXAM: DG ABDOMEN ACUTE W/ 1V CHEST COMPARISON:  None. FINDINGS: Cardiac shadow is within normal limits. The lungs are clear bilaterally. No acute bony abnormality is noted. Scattered large and small bowel gas is noted. No obstructive changes are seen. No free air is noted. No bony abnormality is seen. IMPRESSION: No acute abnormality in the chest and abdomen. Electronically Signed   By: Alcide CleverMark  Lukens M.D.   On: 05/03/2019 22:55    Pending Labs Unresulted Labs (From admission, onward)    Start     Ordered   05/04/19 0021  SARS Coronavirus 2 (CEPHEID - Performed in [facility] hospital lab), Hosp Order  (Asymptomatic Patients Labs)  Once,   STAT    Question:  Rule Out  Answer:  Yes   05/04/19 0020   05/03/19 2231  Lactic acid, plasma  Once,   STAT     05/03/19 2230   05/03/19 2142  Rapid urine drug screen (hospital performed)  ONCE - STAT,   STAT     05/03/19 2142   05/03/19 2136  Urinalysis, Routine w reflex microscopic  ONCE - STAT,   STAT     05/03/19 2136          Vitals/Pain Today's Vitals   05/03/19 2135 05/03/19 2200 05/03/19 2230 05/03/19 2338  BP:  103/74 137/84 136/61  Pulse:  (!) 131 (!) 138 (!) 125  Resp:      Temp:      TempSrc:      SpO2:  95% 100% 100%  Weight: 82 kg     Height: 5\' 10"  (1.778 m)     PainSc: 5        Isolation Precautions No active isolations  Medications Medications  sodium chloride 0.9 % bolus  2,000 mL (2,000 mLs Intravenous New Bag/Given 05/03/19 2224)  ondansetron (ZOFRAN) injection 4 mg (4 mg Intravenous Given 05/03/19 2226)   pantoprazole (PROTONIX) injection 40 mg (40 mg Intravenous Given 05/03/19 2339)  haloperidol lactate (HALDOL) injection 2 mg (2 mg Intravenous Given 05/03/19 2339)    Mobility walks Low fall risk   Focused Assessments GI   R Recommendations: See Admitting Provider Note  Report given to:   Additional Notes:

## 2019-05-04 NOTE — H&P (Signed)
History and Physical    Zachary Vazquez ENI:[id number] DOB: 1986/01/21 DOA: 05/03/2019  PCP: Azzie Glatter, FNP  Patient coming from: Home.  Chief Complaint: Nausea vomiting.  HPI: Zachary Vazquez is a 33 y.o. male with history of diabetes mellitus type 1 presents to the ER with complaint of ongoing nausea vomiting over the last 3 days.  Unable to keep anything.patient states that he has been taking his Lantus insulin.  Denies any blood in the vomitus denies any diarrhea or abdominal pain.  Denies fever chills chest pain or shortness of breath or productive cough.  ED Course: The ER patient was tachycardic and showing sinus tachycardia.  Acute abdominal series was unremarkable.  Patient was given fluid bolus lactate was elevated at 3.5 patient is afebrile no signs of any infection.  UA is showing ketones and large amount of glucose.  Urine drug screen is positive for marijuana.  Repeat metabolic panel including LFTs shows persistent anion gap of 22 creatinine increased to 1.7 blood glucose 351 bicarb 15 AST ALT were normal with total bilirubin 2.4.  Patient started on insulin infusion after having 3 L of fluid bolus.  Admitted for DKA and intractable nausea vomiting likely from gastroparesis but on exam abdomen appears benign.  Review of Systems: As per HPI, rest all negative.   Past Medical History:  Diagnosis Date  . Type 1 diabetes mellitus ([place])     Past Surgical History:  Procedure Laterality Date  . TONSILLECTOMY       reports that he has been smoking cigarettes. He has a 15.00 pack-year smoking history. He has never used smokeless tobacco. He reports current drug use. Drug: Marijuana. He reports that he does not drink alcohol.  Allergies  Allergen Reactions  . Clindamycin/Lincomycin Anaphylaxis    Family History  Problem Relation Age of Onset  . Hypertension Mother   . Diabetes Mellitus I Brother     Prior to Admission medications   Medication Sig Start Date End Date  Taking? Authorizing Provider  insulin aspart (NOVOLOG) 100 UNIT/ML injection 5 units daily with breakfast, 7 units with lunch, 7 units with dinner. Hold short-acting insulin if blood sugar < 125. Patient taking differently: Inject 5-20 Units into the skin 3 (three) times daily as needed for high blood sugar.  12/15/18  Yes Azzie Glatter, FNP  insulin glargine (LANTUS) 100 UNIT/ML injection Inject 0.3 mLs (30 Units total) into the skin daily. 12/15/18  Yes Azzie Glatter, FNP  sildenafil (VIAGRA) 100 MG tablet Take 0.5-1 tablets (50-100 mg total) by mouth daily as needed for erectile dysfunction. 10/30/18  Yes Azzie Glatter, FNP    Physical Exam: Constitutional: Moderately built and nourished. Vitals:   05/03/19 2200 05/03/19 2230 05/03/19 2338 05/04/19 0246  BP: 103/74 137/84 136/61 127/67  Pulse: (!) 131 (!) 138 (!) 125 (!) 122  Resp:    18  Temp:    98.2 F (36.8 C)  TempSrc:    Oral  SpO2: 95% 100% 100% 98%  Weight:      Height:       Eyes: Anicteric no pallor. ENMT: No discharge from the ears eyes nose or mouth. Neck: No mass or.  No neck rigidity. Respiratory: No rhonchi or crepitations. Cardiovascular: S1-S2 heard. Abdomen: Soft nontender bowel sounds present. Musculoskeletal: No edema.  No joint effusion. Skin: No rash. Neurologic: Alert awake oriented to time place and person.  Moves all extremities. Psychiatric: Appears normal per normal affect.   Labs on Admission:  I have personally reviewed following labs and imaging studies  CBC: Recent Labs  Lab 05/03/19 2140  WBC 13.8*  HGB 17.5*  HCT 51.7  MCV 91.5  PLT 422*   Basic Metabolic Panel: Recent Labs  Lab 05/03/19 2140 05/04/19 0215  NA 136 136  K 4.3 5.0  CL 93* 99  CO2 18* 15*  GLUCOSE 182* 351*  BUN 16 20  CREATININE 1.52* 1.73*  CALCIUM 10.1 8.8*   GFR: Estimated Creatinine Clearance: 63.3 mL/min (A) (by C-G formula based on SCr of 1.73 mg/dL (H)). Liver Function Tests: Recent Labs   Lab 05/03/19 2140 05/04/19 0215  AST 24 20  ALT 21 21  ALKPHOS 76 62  BILITOT 2.3* 2.4*  PROT 8.2* 6.5  ALBUMIN 5.0 4.0   Recent Labs  Lab 05/03/19 2140  LIPASE 18   No results for input(s): AMMONIA in the last 168 hours. Coagulation Profile: No results for input(s): INR, PROTIME in the last 168 hours. Cardiac Enzymes: No results for input(s): CKTOTAL, CKMB, CKMBINDEX, TROPONINI in the last 168 hours. BNP (last 3 results) No results for input(s): PROBNP in the last 8760 hours. HbA1C: No results for input(s): HGBA1C in the last 72 hours. CBG: Recent Labs  Lab 05/03/19 2056 05/03/19 2127  GLUCAP 150* 167*   Lipid Profile: No results for input(s): CHOL, HDL, LDLCALC, TRIG, CHOLHDL, LDLDIRECT in the last 72 hours. Thyroid Function Tests: No results for input(s): TSH, T4TOTAL, FREET4, T3FREE, THYROIDAB in the last 72 hours. Anemia Panel: No results for input(s): VITAMINB12, FOLATE, FERRITIN, TIBC, IRON, RETICCTPCT in the last 72 hours. Urine analysis:    Component Value Date/Time   COLORURINE YELLOW 05/03/2019 0053   APPEARANCEUR CLEAR 05/03/2019 0053   LABSPEC 1.022 05/03/2019 0053   PHURINE 5.0 05/03/2019 0053   GLUCOSEU >=500 (A) 05/03/2019 0053   HGBUR NEGATIVE 05/03/2019 0053   BILIRUBINUR NEGATIVE 05/03/2019 0053   BILIRUBINUR NEG 10/30/2018 1459   KETONESUR 80 (A) 05/03/2019 0053   PROTEINUR 30 (A) 05/03/2019 0053   UROBILINOGEN 0.2 10/30/2018 1459   UROBILINOGEN 0.2 01/19/2018 0838   NITRITE NEGATIVE 05/03/2019 0053   LEUKOCYTESUR NEGATIVE 05/03/2019 0053   Sepsis Labs: @LABRCNTIP (procalcitonin:4,lacticidven:4) ) Recent Results (from the past 240 hour(s))  SARS Coronavirus 2 (CEPHEID - Performed in [facility] hospital lab), Hosp Order     Status: None   Collection Time: 05/04/19 12:44 AM   Specimen: Urine, Clean Catch; Nasopharyngeal  Result Value Ref Range Status   SARS Coronavirus 2 NEGATIVE NEGATIVE Final    Comment: (NOTE) If result is  NEGATIVE SARS-CoV-2 target nucleic acids are NOT DETECTED. The SARS-CoV-2 RNA is generally detectable in upper and lower  respiratory specimens during the acute phase of infection. The lowest  concentration of SARS-CoV-2 viral copies this assay can detect is 250  copies / mL. A negative result does not preclude SARS-CoV-2 infection  and should not be used as the sole basis for treatment or other  patient management decisions.  A negative result may occur with  improper specimen collection / handling, submission of specimen other  than nasopharyngeal swab, presence of viral mutation(s) within the  areas targeted by this assay, and inadequate number of viral copies  (<250 copies / mL). A negative result must be combined with clinical  observations, patient history, and epidemiological information. If result is POSITIVE SARS-CoV-2 target nucleic acids are DETECTED. The SARS-CoV-2 RNA is generally detectable in upper and lower  respiratory specimens dur ing the acute phase of infection.  Positive  results are indicative of active infection with SARS-CoV-2.  Clinical  correlation with patient history and other diagnostic information is  necessary to determine patient infection status.  Positive results do  not rule out bacterial infection or co-infection with other viruses. If result is PRESUMPTIVE POSTIVE SARS-CoV-2 nucleic acids MAY BE PRESENT.   A presumptive positive result was obtained on the submitted specimen  and confirmed on repeat testing.  While 2019 novel coronavirus  (SARS-CoV-2) nucleic acids may be present in the submitted sample  additional confirmatory testing may be necessary for epidemiological  and / or clinical management purposes  to differentiate between  SARS-CoV-2 and other Sarbecovirus currently known to infect humans.  If clinically indicated additional testing with an alternate test  methodology [phone]([id number]) is advised. The SARS-CoV-2 RNA is generally  detectable  in upper and lower respiratory sp ecimens during the acute  phase of infection. The expected result is Negative. Fact Sheet for Patients:  BoilerBrush.com.cyhttps://www.fda.gov/media/136312/download Fact Sheet for Healthcare Providers: https://pope.com/https://www.fda.gov/media/136313/download This test is not yet approved or cleared by the [place] FDA and has been authorized for detection and/or diagnosis of SARS-CoV-2 by FDA under an Emergency Use Authorization (EUA).  This EUA will remain in effect (meaning this test can be used) for the duration of the COVID-19 declaration under Section 564(b)(1) of the Act, 21 U.S.C. section 360bbb-3(b)(1), unless the authorization is terminated or revoked sooner. Performed at [facility], [address] [place], [place] [address]      Radiological Exams on Admission: Dg Abd Acute 2+v W 1v Chest  Result Date: 05/03/2019 CLINICAL DATA:  Nausea and vomiting and abdominal pain for several days EXAM: DG ABDOMEN ACUTE W/ 1V CHEST COMPARISON:  None. FINDINGS: Cardiac shadow is within normal limits. The lungs are clear bilaterally. No acute bony abnormality is noted. Scattered large and small bowel gas is noted. No obstructive changes are seen. No free air is noted. No bony abnormality is seen. IMPRESSION: No acute abnormality in the chest and abdomen. Electronically Signed   By: Alcide CleverMark  Lukens M.D.   On: 05/03/2019 22:55    EKG: Independently reviewed.  Sinus tachycardia.  Nonspecific T changes.  Assessment/Plan Principal Problem:   DKA, type 1 (HCC) Active Problems:   Type 1 diabetes mellitus (HCC)   Nausea & vomiting   DKA (diabetic ketoacidoses) (HCC)    1. Diabetic ketoacidosis type 1 diabetes -precipitating cause not clear but not sure of patient's compliance with medications.  Patient has been started on fluid bolus insulin infusion closely follow metabolic panel to confirm anion gap getting corrected.  May need diabetic counseling. 2. Intractable nausea vomiting  likely from gastroparesis.  For now patient n.p.o. PRN antiemetics. 3. Acute renal failure likely from vomiting and dehydration.  I think patient's creatinine will improve with hydration. 4. Tobacco and marijuana abuse will get social work counseling.   DVT prophylaxis: Lovenox. Code Status: Full code. Family Communication: Discussed with patient. Disposition Plan: Home. Consults called: None. Admission status: Observation.   Eduard ClosArshad N Manfred Laspina MD [facility] Pager [phone].  If 7PM-7AM, please contact night-coverage [email or web address] Password [email or web address]  05/04/2019, 3:06 AM

## 2019-05-04 NOTE — ED Notes (Signed)
Pt alert and oriented x 4, continued emesis at this time.

## 2019-05-04 NOTE — ED Notes (Signed)
Pt states last vomiting about 60 minutes ago.  States coffee sounds good this AM.   Will attempt ice chips first.  Message also sent to MD regarding continued emesis as of 0700.  MD to review chart and order accordingly.

## 2019-05-05 LAB — BASIC METABOLIC PANEL
Anion gap: 9 (ref 5–15)
BUN: 10 mg/dL (ref 6–20)
CO2: 23 mmol/L (ref 22–32)
Calcium: 8.8 mg/dL — ABNORMAL LOW (ref 8.9–10.3)
Chloride: 102 mmol/L (ref 98–111)
Creatinine, Ser: 1.12 mg/dL (ref 0.61–1.24)
GFR calc Af Amer: >60 mL/min (ref >60)
GFR calc non Af Amer: >60 mL/min (ref >60)
Glucose, Bld: 280 mg/dL — ABNORMAL HIGH (ref 70–99)
Potassium: 3.8 mmol/L (ref 3.5–5.1)
Sodium: 134 mmol/L — ABNORMAL LOW (ref 135–145)

## 2019-05-05 LAB — GLUCOSE, CAPILLARY: Glucose-Capillary: 202 mg/dL — ABNORMAL HIGH (ref 70–99)

## 2019-05-05 LAB — MAGNESIUM: Magnesium: 1.8 mg/dL (ref 1.7–2.4)

## 2019-05-05 MED ORDER — SODIUM CHLORIDE 0.9 % IV SOLN
INTRAVENOUS | Status: DC
  Administered 2019-05-05: 06:00:00 via INTRAVENOUS

## 2019-05-05 NOTE — Discharge Summary (Signed)
Physician Discharge Summary  Zachary Vazquez ZOX:[id number] DOB: 01/23/1986 DOA: 05/03/2019  PCP: Kallie LocksStroud, Natalie M, FNP  Admit date: 05/03/2019 Discharge date: 05/05/2019  Admitted From: Home Disposition: Home  Recommendations for Outpatient Follow-up:  1. Follow up with PCP in 1-2 weeks 2. Please obtain BMP/CBC in one week your next doctors visit.  3. Resume home insulin regimen, hemoglobin A1c during this admission 6.8 4. Would recommend outpatient routine follow-up with endocrinology. 5. Educated on compliance   Discharge Condition: Stable CODE STATUS: Full code Diet recommendation: Diabetic  Brief/Interim Summary: 33 year old with history of type 1 diabetes came to the hospital with complaints of nausea and vomiting for 3 days.  Upon admission he was found to be in diabetic ketoacidosis therefore placed on insulin drip protocol.  Over the course of several hours his anion gap closed and was transitioned to subcutaneous insulin.  His hemoglobin A1c was noted to be 6.9.  His blood sugars were well controlled in the hospital.  Diabetic education was provided.  After extensive talk with the patient it appeared that he has been noncompliant with his diet recently which led to uncontrolled blood glucose.  He also had episodes of nausea and vomiting during the hospital which I suspect from mild episode of gastroparesis but improved on the day of discharge. Today patient is stable to be discharged with outpatient follow-up recommendations as stated above. No further change in insulin at this time as his hemoglobin A1c has improved with current regiment.  He just needs to remain compliant with his diet.  Discharge Diagnoses:  Principal Problem:   DKA, type 1 (HCC) Active Problems:   Type 1 diabetes mellitus (HCC)   Nausea & vomiting   DKA (diabetic ketoacidoses) (HCC)  Diabetic ketoacidosis with history of type 1 diabetes - DKA has resolved, transition to home regimen of subcutaneous insulin.   Blood glucoses are well controlled in the hospital.  Tolerating oral diet.  Hemoglobin A1c 6.9 which is improved from the past therefore no further change in his insulin regimen.  He just needs to remain compliant with his diabetes medications.  Would also benefit from outpatient follow-up with endocrinology.  Nausea vomiting, resolved -Suspect secondary to gastroparesis.  This morning he is tolerating oral diet.  Mild acute kidney injury -Secondary to dehydration.  Baseline creatinine 1.0.  Admission creatinine 1.73.  Improved with fluids.  Daily tobacco use -Counseled to quit using this.  Consultations:  None  Subjective: Feels much better.  Tolerating oral diet, wishes to go home.  Discharge Exam: Vitals:   05/05/19 0348 05/05/19 0605  BP:  119/74  Pulse: 89 83  Resp: 15 20  Temp:  98.1 F (36.7 C)  SpO2: 98% 96%   Vitals:   05/05/19 0044 05/05/19 0200 05/05/19 0348 05/05/19 0605  BP:    119/74  Pulse: (!) 140 91 89 83  Resp: (!) 27 15 15 20   Temp:    98.1 F (36.7 C)  TempSrc:    Oral  SpO2: 98% 97% 98% 96%  Weight:      Height:        General: Pt is alert, awake, not in acute distress Cardiovascular: RRR, S1/S2 +, no rubs, no gallops Respiratory: CTA bilaterally, no wheezing, no rhonchi Abdominal: Soft, NT, ND, bowel sounds + Extremities: no edema, no cyanosis  Discharge Instructions  Discharge Instructions    Diet Carb Modified   Complete by: As directed    Increase activity slowly   Complete by: As directed  Allergies as of 05/05/2019      Reactions   Clindamycin/lincomycin Anaphylaxis      Medication List    TAKE these medications   insulin aspart 100 UNIT/ML injection Commonly known as: NovoLOG 5 units daily with breakfast, 7 units with lunch, 7 units with dinner. Hold short-acting insulin if blood sugar < 125. What changed:   how much to take  how to take this  when to take this  reasons to take this  additional instructions    insulin glargine 100 UNIT/ML injection Commonly known as: LANTUS Inject 0.3 mLs (30 Units total) into the skin daily.   sildenafil 100 MG tablet Commonly known as: Viagra Take 0.5-1 tablets (50-100 mg total) by mouth daily as needed for erectile dysfunction.      Follow-up Information    [name], FNP. Schedule an appointment as soon as possible for a visit in 1 week(s).   Specialty: Family Medicine Contact information: [address] [place] [phone]          Allergies  Allergen Reactions  . Clindamycin/Lincomycin Anaphylaxis    You were cared for by a hospitalist during your hospital stay. If you have any questions about your discharge medications or the care you received while you were in the hospital after you are discharged, you can call the unit and asked to speak with the hospitalist on call if the hospitalist that took care of you is not available. Once you are discharged, your primary care physician will handle any further medical issues. Please note that no refills for any discharge medications will be authorized once you are discharged, as it is imperative that you return to your primary care physician (or establish a relationship with a primary care physician if you do not have one) for your aftercare needs so that they can reassess your need for medications and monitor your lab values.   Procedures/Studies: Dg Abd Acute 2+v W 1v Chest  Result Date: 05/03/2019 CLINICAL DATA:  Nausea and vomiting and abdominal pain for several days EXAM: DG ABDOMEN ACUTE W/ 1V CHEST COMPARISON:  None. FINDINGS: Cardiac shadow is within normal limits. The lungs are clear bilaterally. No acute bony abnormality is noted. Scattered large and small bowel gas is noted. No obstructive changes are seen. No free air is noted. No bony abnormality is seen. IMPRESSION: No acute abnormality in the chest and abdomen. Electronically Signed   By: Alcide Clever M.D.   On:  05/03/2019 22:55      The results of significant diagnostics from this hospitalization (including imaging, microbiology, ancillary and laboratory) are listed below for reference.     Microbiology: Recent Results (from the past 240 hour(s))  SARS Coronavirus 2 (CEPHEID - Performed in [facility] hospital lab), Hosp Order     Status: None   Collection Time: 05/04/19 12:44 AM   Specimen: Urine, Clean Catch; Nasopharyngeal  Result Value Ref Range Status   SARS Coronavirus 2 NEGATIVE NEGATIVE Final    Comment: (NOTE) If result is NEGATIVE SARS-CoV-2 target nucleic acids are NOT DETECTED. The SARS-CoV-2 RNA is generally detectable in upper and lower  respiratory specimens during the acute phase of infection. The lowest  concentration of SARS-CoV-2 viral copies this assay can detect is 250  copies / mL. A negative result does not preclude SARS-CoV-2 infection  and should not be used as the sole basis for treatment or other  patient management decisions.  A negative result may occur with  improper specimen collection / handling, submission of specimen other  than nasopharyngeal swab, presence of viral mutation(s) within the  areas targeted by this assay, and inadequate number of viral copies  (<250 copies / mL). A negative result must be combined with clinical  observations, patient history, and epidemiological information. If result is POSITIVE SARS-CoV-2 target nucleic acids are DETECTED. The SARS-CoV-2 RNA is generally detectable in upper and lower  respiratory specimens dur ing the acute phase of infection.  Positive  results are indicative of active infection with SARS-CoV-2.  Clinical  correlation with patient history and other diagnostic information is  necessary to determine patient infection status.  Positive results do  not rule out bacterial infection or co-infection with other viruses. If result is PRESUMPTIVE POSTIVE SARS-CoV-2 nucleic acids MAY BE PRESENT.   A  presumptive positive result was obtained on the submitted specimen  and confirmed on repeat testing.  While 2019 novel coronavirus  (SARS-CoV-2) nucleic acids may be present in the submitted sample  additional confirmatory testing may be necessary for epidemiological  and / or clinical management purposes  to differentiate between  SARS-CoV-2 and other Sarbecovirus currently known to infect humans.  If clinically indicated additional testing with an alternate test  methodology [phone]([id number]) is advised. The SARS-CoV-2 RNA is generally  detectable in upper and lower respiratory sp ecimens during the acute  phase of infection. The expected result is Negative. Fact Sheet for Patients:  BoilerBrush.com.cyhttps://www.fda.gov/media/136312/download Fact Sheet for Healthcare Providers: https://pope.com/https://www.fda.gov/media/136313/download This test is not yet approved or cleared by the [place] FDA and has been authorized for detection and/or diagnosis of SARS-CoV-2 by FDA under an Emergency Use Authorization (EUA).  This EUA will remain in effect (meaning this test can be used) for the duration of the COVID-19 declaration under Section 564(b)(1) of the Act, 21 U.S.C. section 360bbb-3(b)(1), unless the authorization is terminated or revoked sooner. Performed at [facility], [address] [place], [place] [address]   MRSA PCR Screening     Status: None   Collection Time: 05/04/19  2:49 PM   Specimen: Nasopharyngeal  Result Value Ref Range Status   MRSA by PCR NEGATIVE NEGATIVE Final    Comment:        The GeneXpert MRSA Assay (FDA approved for NASAL specimens only), is one component of a comprehensive MRSA colonization surveillance program. It is not intended to diagnose MRSA infection nor to guide or monitor treatment for MRSA infections. Performed at [facility], [address] [place], [place] [address]      Labs: BNP (last 3 results) No results for input(s): BNP in the last 8760  hours. Basic Metabolic Panel: Recent Labs  Lab 05/04/19 0215 05/04/19 0545 05/04/19 0940 05/04/19 1345 05/05/19 0212  NA 136 137 138 137 134*  K 5.0 4.7 4.7 4.3 3.8  CL 99 104 108 106 102  CO2 15* 12* 15* 19* 23  GLUCOSE 351* 284* 200* 152* 280*  BUN 20 22* 18 16 10   CREATININE 1.73* 1.69* 1.49* 1.31* 1.12  CALCIUM 8.8* 8.5* 8.6* 8.8* 8.8*  MG  --   --   --   --  1.8   Liver Function Tests: Recent Labs  Lab 05/03/19 2140 05/04/19 0215  AST 24 20  ALT 21 21  ALKPHOS 76 62  BILITOT 2.3* 2.4*  PROT 8.2* 6.5  ALBUMIN 5.0 4.0   Recent Labs  Lab 05/03/19 2140  LIPASE 18   No results for input(s): AMMONIA in  the last 168 hours. CBC: Recent Labs  Lab 05/03/19 2140 05/04/19 0703  WBC 13.8* 20.7*  HGB 17.5* 14.6  HCT 51.7 44.4  MCV 91.5 93.9  PLT 422* 319   Cardiac Enzymes: No results for input(s): CKTOTAL, CKMB, CKMBINDEX, TROPONINI in the last 168 hours. BNP: Invalid input(s): POCBNP CBG: Recent Labs  Lab 05/04/19 1345 05/04/19 1446 05/04/19 1650 05/04/19 2152 05/05/19 0839  GLUCAP 137* 149* 155* 165* 202*   D-Dimer No results for input(s): DDIMER in the last 72 hours. Hgb A1c Recent Labs    05/04/19 0940  HGBA1C 6.9*   Lipid Profile No results for input(s): CHOL, HDL, LDLCALC, TRIG, CHOLHDL, LDLDIRECT in the last 72 hours. Thyroid function studies No results for input(s): TSH, T4TOTAL, T3FREE, THYROIDAB in the last 72 hours.  Invalid input(s): FREET3 Anemia work up No results for input(s): VITAMINB12, FOLATE, FERRITIN, TIBC, IRON, RETICCTPCT in the last 72 hours. Urinalysis    Component Value Date/Time   COLORURINE YELLOW 05/03/2019 0053   APPEARANCEUR CLEAR 05/03/2019 0053   LABSPEC 1.022 05/03/2019 0053   PHURINE 5.0 05/03/2019 0053   GLUCOSEU >=500 (A) 05/03/2019 0053   HGBUR NEGATIVE 05/03/2019 0053   BILIRUBINUR NEGATIVE 05/03/2019 0053   BILIRUBINUR NEG 10/30/2018 1459   KETONESUR 80 (A) 05/03/2019 0053   PROTEINUR 30 (A)  05/03/2019 0053   UROBILINOGEN 0.2 10/30/2018 1459   UROBILINOGEN 0.2 01/19/2018 0838   NITRITE NEGATIVE 05/03/2019 0053   LEUKOCYTESUR NEGATIVE 05/03/2019 0053   Sepsis Labs Invalid input(s): PROCALCITONIN,  WBC,  LACTICIDVEN Microbiology Recent Results (from the past 240 hour(s))  SARS Coronavirus 2 (CEPHEID - Performed in [facility] hospital lab), Hosp Order     Status: None   Collection Time: 05/04/19 12:44 AM   Specimen: Urine, Clean Catch; Nasopharyngeal  Result Value Ref Range Status   SARS Coronavirus 2 NEGATIVE NEGATIVE Final    Comment: (NOTE) If result is NEGATIVE SARS-CoV-2 target nucleic acids are NOT DETECTED. The SARS-CoV-2 RNA is generally detectable in upper and lower  respiratory specimens during the acute phase of infection. The lowest  concentration of SARS-CoV-2 viral copies this assay can detect is 250  copies / mL. A negative result does not preclude SARS-CoV-2 infection  and should not be used as the sole basis for treatment or other  patient management decisions.  A negative result may occur with  improper specimen collection / handling, submission of specimen other  than nasopharyngeal swab, presence of viral mutation(s) within the  areas targeted by this assay, and inadequate number of viral copies  (<250 copies / mL). A negative result must be combined with clinical  observations, patient history, and epidemiological information. If result is POSITIVE SARS-CoV-2 target nucleic acids are DETECTED. The SARS-CoV-2 RNA is generally detectable in upper and lower  respiratory specimens dur ing the acute phase of infection.  Positive  results are indicative of active infection with SARS-CoV-2.  Clinical  correlation with patient history and other diagnostic information is  necessary to determine patient infection status.  Positive results do  not rule out bacterial infection or co-infection with other viruses. If result is PRESUMPTIVE POSTIVE SARS-CoV-2  nucleic acids MAY BE PRESENT.   A presumptive positive result was obtained on the submitted specimen  and confirmed on repeat testing.  While 2019 novel coronavirus  (SARS-CoV-2) nucleic acids may be present in the submitted sample  additional confirmatory testing may be necessary for epidemiological  and / or clinical management purposes  to differentiate between  SARS-CoV-2 and other Sarbecovirus currently known to infect humans.  If clinically indicated additional testing with an alternate test  methodology [phone]) is advised. The SARS-CoV-2 RNA is generally  detectable in upper and lower respiratory sp ecimens during the acute  phase of infection. The expected result is Negative. Fact Sheet for Patients:  StrictlyIdeas.no Fact Sheet for Healthcare Providers: BankingDealers.co.za This test is not yet approved or cleared by the [place] FDA and has been authorized for detection and/or diagnosis of SARS-CoV-2 by FDA under an Emergency Use Authorization (EUA).  This EUA will remain in effect (meaning this test can be used) for the duration of the COVID-19 declaration under Section 564(b)(1) of the Act, 21 U.S.C. section 360bbb-3(b)(1), unless the authorization is terminated or revoked sooner. Performed at [facility], [place] [address] [place], [place] [address]   MRSA PCR Screening     Status: None   Collection Time: 05/04/19  2:49 PM   Specimen: Nasopharyngeal  Result Value Ref Range Status   MRSA by PCR NEGATIVE NEGATIVE Final    Comment:        The GeneXpert MRSA Assay (FDA approved for NASAL specimens only), is one component of a comprehensive MRSA colonization surveillance program. It is not intended to diagnose MRSA infection nor to guide or monitor treatment for MRSA infections. Performed at [facility], [place] [address], [place],  [address]      Time coordinating discharge:  I have spent 35  minutes face to face with the patient and on the ward discussing the patients care, assessment, plan and disposition with other care givers. >50% of the time was devoted counseling the patient about the risks and benefits of treatment/Discharge disposition and coordinating care.   SIGNED:   Damita Lack, MD  [facility] 05/05/2019, 10:40 AM   If 7PM-7AM, please contact night-coverage [email or web address]

## 2019-05-05 NOTE — Progress Notes (Signed)
Zachary Vazquez to be D/C'd home per MD order. Discussed with the patient and all questions fully answered. Held meal coverage insulin before discharge. Patient stated he would get something to eat on the way home from the hospital. VVS, Skin clean, dry and intact without evidence of skin break down, no evidence of skin tears noted.  IV catheter discontinued intact. Site without signs and symptoms of complications. Dressing and pressure applied.  An After Visit Summary was printed and given to the patient.  Patient escorted via [place], and D/C home via private auto.  [name]  05/05/2019 9:50 AM

## 2019-05-05 NOTE — Progress Notes (Signed)
Observed HR increase to 140's and peak at 150 while assisting patient to bathroom. Inquired if patient felt like heart pounding, lightheaded or dizzy. Declined symptoms.Paged NP Schorr at [phone] and informed of tachycardia with ambulation. Will continue to monitor.   Paged NP Schorr at [phone], notified CBG 280 with AM lab draw and inquired if wanted to discontinue D5 1/2NS. New fluid orders placed.

## 2019-07-26 MED FILL — $LANTUS 100 UNITS/ML VIAL: 100 | 84 days supply | Qty: 30 | Fill #3

## 2019-07-26 MED FILL — $novoLOG 100 UNITS/ML VIAL: 100 | 84 days supply | Qty: 30 | Fill #2

## 2019-10-04 ENCOUNTER — Encounter: Payer: Self-pay | Admitting: Family Medicine

## 2019-10-04 ENCOUNTER — Ambulatory Visit (INDEPENDENT_AMBULATORY_CARE_PROVIDER_SITE_OTHER): Payer: Self-pay | Admitting: Family Medicine

## 2019-10-04 ENCOUNTER — Other Ambulatory Visit: Payer: Self-pay

## 2019-10-04 VITALS — BP 117/75 | HR 98 | Temp 97.8°F | Ht 70.0 in | Wt 192.2 lb

## 2019-10-04 DIAGNOSIS — E109 Type 1 diabetes mellitus without complications: Secondary | ICD-10-CM

## 2019-10-04 DIAGNOSIS — F17200 Nicotine dependence, unspecified, uncomplicated: Secondary | ICD-10-CM

## 2019-10-04 DIAGNOSIS — Z09 Encounter for follow-up examination after completed treatment for conditions other than malignant neoplasm: Secondary | ICD-10-CM

## 2019-10-04 DIAGNOSIS — R7309 Other abnormal glucose: Secondary | ICD-10-CM

## 2019-10-04 LAB — POCT URINALYSIS DIPSTICK
Bilirubin, UA: NEGATIVE
Blood, UA: NEGATIVE
Glucose, UA: POSITIVE — AB
Ketones, UA: NEGATIVE
Leukocytes, UA: NEGATIVE
Nitrite, UA: NEGATIVE
Protein, UA: POSITIVE — AB
Spec Grav, UA: 1.02 (ref 1.010–1.025)
Urobilinogen, UA: 2 E.U./dL — AB
pH, UA: 8.5 — AB (ref 5.0–8.0)

## 2019-10-04 LAB — GLUCOSE, POCT (MANUAL RESULT ENTRY): POC Glucose: 141 mg/dl — AB (ref 70–99)

## 2019-10-04 NOTE — Progress Notes (Signed)
Patient [name]   Established Patient Office Visit  Subjective:  Patient ID: Zachary Vazquez, male    DOB: Mar 02, 1986  Age: 33 y.o. MRN: [id number]  CC:  Chief Complaint  Patient presents with  . Follow-up    DM 1    HPI Zachary Vazquez is a 33 year old male who presents for follow up today.   Past Medical History:  Diagnosis Date  . Type 1 diabetes mellitus ([place])    Current Status: Since her last office visit, she is doing well with no complaints.  She has not been monitoring her blood glucose levels recently. She has seen low range of 50, which he ate a meal; and high of 300 since his last office visit. She denies fatigue, frequent urination, blurred vision, excessive hunger, excessive thirst, weight gain, weight loss, and poor wound healing. She continues to check her feet regularly. She denies fevers, chills, fatigue, recent infections, weight loss, and night sweats. She has not had any headaches, visual changes, dizziness, and falls. No chest pain, heart palpitations, cough and shortness of breath reported. No reports of GI problems such as nausea, vomiting, and diarrhea. He has c/o constipation. She has no reports of blood in stools, dysuria and hematuria. No depression or anxiety reported today. He denies suicidal ideations, homicidal ideations, or auditory hallucinations. She denies pain today.   Past Surgical History:  Procedure Laterality Date  . TONSILLECTOMY      Family History  Problem Relation Age of Onset  . Hypertension Mother   . Diabetes Mellitus I Brother     Social History   Socioeconomic History  . Marital status: Single    Spouse name: Not on file  . Number of children: Not on file  . Years of education: Not on file  . Highest education level: Not on file  Occupational History  . Not on file  Social Needs  . Financial resource strain: Not on file  . Food insecurity    Worry: Not on file    Inability:  Not on file  . Transportation needs    Medical: Not on file    Non-medical: Not on file  Tobacco Use  . Smoking status: Current Every Day Smoker    Packs/day: 1.00    Years: 15.00    Pack years: 15.00    Types: Cigarettes  . Smokeless tobacco: Never Used  Substance and Sexual Activity  . Alcohol use: No  . Drug use: Yes    Types: Marijuana    Comment: occ  . Sexual activity: Yes    Birth control/protection: None  Lifestyle  . Physical activity    Days per week: Not on file    Minutes per session: Not on file  . Stress: Not on file  Relationships  . Social Herbalist on phone: Not on file    Gets together: Not on file    Attends religious service: Not on file    Active member of club or organization: Not on file    Attends meetings of clubs or organizations: Not on file    Relationship status: Not on file  . Intimate partner violence    Fear of current or ex partner: Not on file    Emotionally abused: Not on file    Physically abused: Not on file    Forced sexual activity: Not on file  Other Topics Concern  . Not on file  Social History  Narrative  . Not on file    Outpatient Medications Prior to Visit  Medication Sig Dispense Refill  . insulin aspart (NOVOLOG) 100 UNIT/ML injection 5 units daily with breakfast, 7 units with lunch, 7 units with dinner. Hold short-acting insulin if blood sugar < 125. (Patient taking differently: Inject 5-20 Units into the skin 3 (three) times daily as needed for high blood sugar. ) 10 mL 11  . insulin glargine (LANTUS) 100 UNIT/ML injection Inject 0.3 mLs (30 Units total) into the skin daily. 10 mL 11  . sildenafil (VIAGRA) 100 MG tablet Take 0.5-1 tablets (50-100 mg total) by mouth daily as needed for erectile dysfunction. 5 tablet 3   No facility-administered medications prior to visit.     Allergies  Allergen Reactions  . Clindamycin/Lincomycin Anaphylaxis    ROS Review of Systems  Constitutional: Negative.    HENT: Negative.   Eyes: Negative.   Respiratory: Negative.   Cardiovascular: Negative.   Gastrointestinal: Negative.   Endocrine: Negative.   Genitourinary: Negative.   Musculoskeletal: Negative.   Skin: Negative.   Allergic/Immunologic: Negative.   Neurological: Negative.   Hematological: Negative.   Psychiatric/Behavioral: Negative.       Objective:    Physical Exam  Constitutional: He is oriented to person, place, and time. He appears well-developed and well-nourished.  HENT:  Head: Normocephalic and atraumatic.  Eyes: Conjunctivae are normal.  Neck: Normal range of motion. Neck supple.  Cardiovascular: Normal rate, regular rhythm, normal heart sounds and intact distal pulses.  Pulmonary/Chest: Effort normal and breath sounds normal.  Abdominal: Soft. Bowel sounds are normal.  Musculoskeletal: Normal range of motion.  Neurological: He is alert and oriented to person, place, and time. He has normal reflexes.  Skin: Skin is warm.  Psychiatric: He has a normal mood and affect. His behavior is normal. Judgment and thought content normal.  Nursing note and vitals reviewed.   BP 117/75   Pulse 98   Temp 97.8 F (36.6 C) (Oral)   Ht 5\' 10"  (1.778 m)   Wt 192 lb 3.2 oz (87.2 kg)   SpO2 100%   BMI 27.58 kg/m  Wt Readings from Last 3 Encounters:  10/04/19 192 lb 3.2 oz (87.2 kg)  05/03/19 180 lb 12.4 oz (82 kg)  04/02/19 185 lb 12.8 oz (84.3 kg)     Health Maintenance Due  Topic Date Due  . PNEUMOCOCCAL POLYSACCHARIDE VACCINE AGE 70-64 HIGH RISK  04/04/1989  . OPHTHALMOLOGY EXAM  04/04/1997  . TETANUS/TDAP  04/04/2006  . FOOT EXAM  12/22/2018  . URINE MICROALBUMIN  12/22/2018  . INFLUENZA VACCINE  05/29/2019    There are no preventive care reminders to display for this patient.  Lab Results  Component Value Date   TSH 2.410 12/22/2017   Lab Results  Component Value Date   WBC 20.7 (H) 05/04/2019   HGB 14.6 05/04/2019   HCT 44.4 05/04/2019   MCV 93.9  05/04/2019   PLT 319 05/04/2019   Lab Results  Component Value Date   NA 134 (L) 05/05/2019   K 3.8 05/05/2019   CO2 23 05/05/2019   GLUCOSE 280 (H) 05/05/2019   BUN 10 05/05/2019   CREATININE 1.12 05/05/2019   BILITOT 2.4 (H) 05/04/2019   ALKPHOS 62 05/04/2019   AST 20 05/04/2019   ALT 21 05/04/2019   PROT 6.5 05/04/2019   ALBUMIN 4.0 05/04/2019   CALCIUM 8.8 (L) 05/05/2019   ANIONGAP 9 05/05/2019   No results found for: CHOL No results  found for: HDL No results found for: LDLCALC No results found for: TRIG No results found for: CHOLHDL Lab Results  Component Value Date   HGBA1C 6.9 (H) 05/04/2019      Assessment & Plan:   1. Type 1 diabetes mellitus without complication (HCC) Stable. Continue medications as prescribed. He will continue medication as prescribed, to decrease foods/beverages high in sugars and carbs and follow Heart Healthy or DASH diet. Increase physical activity to at least 30 minutes cardio exercise daily.  - Urinalysis Dipstick - Glucose (CBG) - Microalbumin/Creatinine Ratio, Urine  2. Elevated glucose Blood glucose level at 141 today. Monitor.   3. Tobacco use disorder  4. Follow up He will follow up in 6 months.   No orders of the defined types were placed in this encounter.   Orders Placed This Encounter  Procedures  . Microalbumin/Creatinine Ratio, Urine  . Urinalysis Dipstick  . Glucose (CBG)    Referral Orders  No referral(s) requested today    Raliegh IpNatalie Joanna Borawski,  MSN, FNP-BC [facility] [address]  [place], [place] [address] [phone] fax  Problem List Items Addressed This Visit      Endocrine   Type 1 diabetes mellitus (HCC) - Primary   Relevant Orders   Urinalysis Dipstick (Completed)   Glucose (CBG) (Completed)   Microalbumin/Creatinine Ratio, Urine     Other   Tobacco use disorder    Other Visit Diagnoses    Elevated  glucose       Follow up          No orders of the defined types were placed in this encounter.   Follow-up: Return in about 6 months (around 04/03/2020).    Kallie LocksNatalie M Carely Nappier, FNP

## 2019-10-05 LAB — MICROALBUMIN / CREATININE URINE RATIO
Creatinine, Urine: 238.6 mg/dL
Microalb/Creat Ratio: 10 mg/g creat (ref 0–29)
Microalbumin, Urine: 24.3 ug/mL

## 2019-10-29 DIAGNOSIS — R21 Rash and other nonspecific skin eruption: Secondary | ICD-10-CM

## 2019-10-29 DIAGNOSIS — Z87898 Personal history of other specified conditions: Secondary | ICD-10-CM

## 2019-10-29 DIAGNOSIS — S81802S Unspecified open wound, left lower leg, sequela: Secondary | ICD-10-CM

## 2019-10-29 HISTORY — DX: Personal history of other specified conditions: Z87.898

## 2019-10-29 HISTORY — DX: Unspecified open wound, left lower leg, sequela: S81.802S

## 2019-10-29 HISTORY — DX: Rash and other nonspecific skin eruption: R21

## 2019-11-04 MED FILL — $novoLOG 100 UNITS/ML VIAL: 100 | 56 days supply | Qty: 20 | Fill #3

## 2019-11-04 MED FILL — !LANTUS 100 UNITS/ML VIAL: 100 | 28 days supply | Qty: 10 | Fill #0

## 2019-11-06 ENCOUNTER — Emergency Department (HOSPITAL_COMMUNITY)
Admission: EM | Admit: 2019-11-06 | Discharge: 2019-11-06 | Disposition: A | Discharge status: Home / Self Care | Payer: Self-pay | Attending: Emergency Medicine | Admitting: Emergency Medicine

## 2019-11-06 ENCOUNTER — Other Ambulatory Visit: Payer: Self-pay

## 2019-11-06 ENCOUNTER — Encounter (HOSPITAL_COMMUNITY): Payer: Self-pay | Admitting: *Deleted

## 2019-11-06 DIAGNOSIS — W57XXXA Bitten or stung by nonvenomous insect and other nonvenomous arthropods, initial encounter: Secondary | ICD-10-CM | POA: Insufficient documentation

## 2019-11-06 DIAGNOSIS — Y939 Activity, unspecified: Secondary | ICD-10-CM | POA: Insufficient documentation

## 2019-11-06 DIAGNOSIS — F17210 Nicotine dependence, cigarettes, uncomplicated: Secondary | ICD-10-CM | POA: Insufficient documentation

## 2019-11-06 DIAGNOSIS — E109 Type 1 diabetes mellitus without complications: Secondary | ICD-10-CM | POA: Insufficient documentation

## 2019-11-06 DIAGNOSIS — Y929 Unspecified place or not applicable: Secondary | ICD-10-CM | POA: Insufficient documentation

## 2019-11-06 DIAGNOSIS — Y999 Unspecified external cause status: Secondary | ICD-10-CM | POA: Insufficient documentation

## 2019-11-06 DIAGNOSIS — S80862A Insect bite (nonvenomous), left lower leg, initial encounter: Secondary | ICD-10-CM

## 2019-11-06 LAB — CBC WITH DIFFERENTIAL/PLATELET
Abs Immature Granulocytes: 0.02 10*3/uL (ref 0.00–0.07)
Basophils Absolute: 0.1 10*3/uL (ref 0.0–0.1)
Basophils Relative: 1 %
Eosinophils Absolute: 0.2 10*3/uL (ref 0.0–0.5)
Eosinophils Relative: 2 %
HCT: 47.2 % (ref 39.0–52.0)
Hemoglobin: 15.6 g/dL (ref 13.0–17.0)
Immature Granulocytes: 0 %
Lymphocytes Relative: 34 %
Lymphs Abs: 2.4 10*3/uL (ref 0.7–4.0)
MCH: 31 pg (ref 26.0–34.0)
MCHC: 33.1 g/dL (ref 30.0–36.0)
MCV: 93.8 fL (ref 80.0–100.0)
Monocytes Absolute: 0.5 10*3/uL (ref 0.1–1.0)
Monocytes Relative: 7 %
Neutro Abs: 4 10*3/uL (ref 1.7–7.7)
Neutrophils Relative %: 56 %
Platelets: 257 10*3/uL (ref 150–400)
RBC: 5.03 MIL/uL (ref 4.22–5.81)
RDW: 12.2 % (ref 11.5–15.5)
WBC: 7.2 10*3/uL (ref 4.0–10.5)
nRBC: 0 % (ref 0.0–0.2)

## 2019-11-06 LAB — COMPREHENSIVE METABOLIC PANEL
ALT: 15 U/L (ref 0–44)
AST: 16 U/L (ref 15–41)
Albumin: 4.1 g/dL (ref 3.5–5.0)
Alkaline Phosphatase: 58 U/L (ref 38–126)
Anion gap: 10 (ref 5–15)
BUN: 14 mg/dL (ref 6–20)
CO2: 27 mmol/L (ref 22–32)
Calcium: 9.4 mg/dL (ref 8.9–10.3)
Chloride: 100 mmol/L (ref 98–111)
Creatinine, Ser: 1.18 mg/dL (ref 0.61–1.24)
GFR calc Af Amer: >60 mL/min (ref >60)
GFR calc non Af Amer: >60 mL/min (ref >60)
Glucose, Bld: 168 mg/dL — ABNORMAL HIGH (ref 70–99)
Potassium: 4.5 mmol/L (ref 3.5–5.1)
Sodium: 137 mmol/L (ref 135–145)
Total Bilirubin: 0.7 mg/dL (ref 0.3–1.2)
Total Protein: 6.7 g/dL (ref 6.5–8.1)

## 2019-11-06 LAB — LACTIC ACID, PLASMA
Lactic Acid, Venous: 1.8 mmol/L (ref 0.5–1.9)
Lactic Acid, Venous: 1.3 mmol/L (ref 0.5–1.9)

## 2019-11-06 MED ORDER — CEPHALEXIN 500 MG PO CAPS: 500.0000 mg | ORAL_CAPSULE | Freq: Four times a day (QID) | ORAL | 0 refills | Status: DC

## 2019-11-06 MED ORDER — SODIUM CHLORIDE 0.9% FLUSH: 3.0000 mL | Freq: Once | INTRAVENOUS | Status: DC

## 2019-11-06 NOTE — ED Provider Notes (Signed)
[facility] EMERGENCY DEPARTMENT Provider Note   CSN: [id number] Arrival date & time: 11/06/19  0422     History Chief Complaint  Patient presents with  . Insect Bite    Zachary Vazquez is a 34 y.o. male with PMH significant for type I DM who presents to the ED with wound to the posterior aspect left lower leg.  Patient reports that he does not recall a specific bite or offending injury, but with showering 4 days ago when he noticed the wound.  He shortly thereafter noticed a large blister with clear fluid which he popped and removed.  Since then, he has noticed redness and firmness surrounding a blanched 2 x 2 centimeter lesion.  He denies any fevers, nausea or vomiting, joint pain, chills, or other systemic symptoms.  He has been applying triple antibiotic and most recently Betadine to the area over his concerns for infection given type I DM status.  HPI     Past Medical History:  Diagnosis Date  . Type 1 diabetes mellitus [facility])     Patient Active Problem List   Diagnosis Date Noted  . Nausea & vomiting 05/04/2019  . DKA, type 1 (HCC) 05/04/2019  . DKA (diabetic ketoacidoses) (HCC) 05/04/2019  . Nausea and vomiting 12/02/2017  . Tobacco use disorder 03/30/2017  . Type 1 diabetes mellitus (HCC) 03/30/2017  . Leukocytosis 03/30/2017    Past Surgical History:  Procedure Laterality Date  . TONSILLECTOMY         Family History  Problem Relation Age of Onset  . Hypertension Mother   . Diabetes Mellitus I Brother     Social History   Tobacco Use  . Smoking status: Current Every Day Smoker    Packs/day: 1.00    Years: 15.00    Pack years: 15.00    Types: Cigarettes  . Smokeless tobacco: Never Used  Substance Use Topics  . Alcohol use: No  . Drug use: Yes    Types: Marijuana    Comment: occ    Home Medications Prior to Admission medications   Medication Sig Start Date End Date Taking? Authorizing Provider  cephALEXin (KEFLEX) 500 MG capsule  Take 1 capsule (500 mg total) by mouth 4 (four) times daily. 11/06/19   Lorelee New, PA-C  insulin aspart (NOVOLOG) 100 UNIT/ML injection 5 units daily with breakfast, 7 units with lunch, 7 units with dinner. Hold short-acting insulin if blood sugar < 125. Patient taking differently: Inject 5-20 Units into the skin 3 (three) times daily as needed for high blood sugar.  12/15/18   [name], FNP  insulin glargine (LANTUS) 100 UNIT/ML injection Inject 0.3 mLs (30 Units total) into the skin daily. 12/15/18   [name], FNP  sildenafil (VIAGRA) 100 MG tablet Take 0.5-1 tablets (50-100 mg total) by mouth daily as needed for erectile dysfunction. 10/30/18   [name], FNP    Allergies    Clindamycin/lincomycin  Review of Systems   Review of Systems  Constitutional: Negative for fever.  Gastrointestinal: Negative for vomiting.  Musculoskeletal: Negative for arthralgias.  Skin: Positive for wound.    Physical Exam Updated Vital Signs BP (!) 138/96 (BP Location: Right Arm)   Pulse (!) 109   Temp 98.9 F (37.2 C) (Oral)   Resp 19   Ht 5\' 11"  (1.803 m)   Wt 86.2 kg   SpO2 100%   BMI 26.50 kg/m   Physical Exam Vitals and nursing note reviewed. Exam conducted  with a chaperone present.  Constitutional:      Appearance: Normal appearance.  HENT:     Head: Normocephalic and atraumatic.  Eyes:     General: No scleral icterus.    Conjunctiva/sclera: Conjunctivae normal.  Cardiovascular:     Rate and Rhythm: Normal rate and regular rhythm.     Pulses: Normal pulses.     Heart sounds: Normal heart sounds.  Pulmonary:     Effort: Pulmonary effort is normal.     Breath sounds: Normal breath sounds.  Musculoskeletal:     Comments: Left knee and left ankle full ROM and strength intact.  No gait abnormality.  Skin:    General: Skin is dry.     Comments: 2 x 2 centimeter blanched circular lesion without cavitation or ulceration.  Possible evidence of bite wounds.   Surrounding erythema and induration, approximately 2 cm extension from circular blanched area.  Mild TTP.  Neurological:     General: No focal deficit present.     Mental Status: He is alert and oriented to person, place, and time.     GCS: GCS eye subscore is 4. GCS verbal subscore is 5. GCS motor subscore is 6.     Cranial Nerves: No cranial nerve deficit.     Sensory: No sensory deficit.     Motor: No weakness.     Coordination: Coordination normal.     Gait: Gait normal.  Psychiatric:        Mood and Affect: Mood normal.        Behavior: Behavior normal.        Thought Content: Thought content normal.         ED Results / Procedures / Treatments   Labs (all labs ordered are listed, but only abnormal results are displayed) Labs Reviewed  COMPREHENSIVE METABOLIC PANEL - Abnormal; Notable for the following components:      Result Value   Glucose, Bld 168 (*)    All other components within normal limits  LACTIC ACID, PLASMA  CBC WITH DIFFERENTIAL/PLATELET  LACTIC ACID, PLASMA    EKG None  Radiology No results found.  Procedures Procedures (including critical care time)  Medications Ordered in ED Medications  sodium chloride flush (NS) 0.9 % injection 3 mL (has no administration in time range)    ED Course  I have reviewed the triage vital signs and the nursing notes.  Pertinent labs & imaging results that were available during my care of the patient were reviewed by me and considered in my medical decision making (see chart for details).    MDM Rules/Calculators/A&P                      Patient's wound concerning for spider bite versus other insect bite.  Based on physical exam alone, wound is initially suspicious for cigarette or cigar burn however patient adamantly denies that possibility.  Given that it was a painless injury that progressively became more tender with surrounding erythema and induration, suspect cellulitis secondary to cellulitis/insect  bite.  Patient reports tetanus is up-to-date and does not require Boostrix injection.  Cannot definitively call this a spider bite as offending insect was not positively identified.  We will discharge patient with Keflex x5 days.  Wound care instructions provided.  Also provided strict return precautions for the patient.  He voiced understanding and is agreeable to plan.   Final Clinical Impression(s) / ED Diagnoses Final diagnoses:  Insect bite of left lower leg,  initial encounter    Rx / DC Orders ED Discharge Orders         Ordered    cephALEXin (KEFLEX) 500 MG capsule  4 times daily     11/06/19 0853           Corena Herter, PA-C 11/06/19 8938    Sherwood Gambler, MD 11/06/19 [phone]

## 2019-11-06 NOTE — Discharge Instructions (Signed)
Please read the attachments on wound care.  Please take antibiotics, as prescribed.  Please return to the ED or seek medical attention should your wound continue to get worse despite antibiotics and wound care provided.  Please also return should you develop any fevers, chills, profound fatigue, nausea or vomiting, or other symptoms.

## 2019-11-06 NOTE — ED Triage Notes (Signed)
Pt noticed an inspect bite to posterior L calf on Tuesday. Pt attempted to drain area at home; redness noted to left lower leg. Hx of DM and concerned for increased infection

## 2019-11-08 MED FILL — CEPHALEXIN 500 MG CAPSULE: 500 | 5 days supply | Qty: 20 | Fill #0

## 2019-11-17 ENCOUNTER — Telehealth: Payer: Self-pay | Admitting: Family Medicine

## 2019-11-17 ENCOUNTER — Ambulatory Visit (INDEPENDENT_AMBULATORY_CARE_PROVIDER_SITE_OTHER): Payer: Self-pay | Admitting: Family Medicine

## 2019-11-17 ENCOUNTER — Emergency Department (HOSPITAL_COMMUNITY)
Admission: EM | Admit: 2019-11-17 | Discharge: 2019-11-17 | Disposition: A | Discharge status: Home / Self Care | Payer: Self-pay | Attending: Emergency Medicine | Admitting: Emergency Medicine

## 2019-11-17 ENCOUNTER — Encounter: Payer: Self-pay | Admitting: Family Medicine

## 2019-11-17 ENCOUNTER — Encounter (HOSPITAL_COMMUNITY): Payer: Self-pay | Admitting: Emergency Medicine

## 2019-11-17 ENCOUNTER — Other Ambulatory Visit: Payer: Self-pay

## 2019-11-17 VITALS — BP 128/90 | HR 114 | Temp 97.9°F | Ht 71.0 in | Wt 191.0 lb

## 2019-11-17 DIAGNOSIS — Z09 Encounter for follow-up examination after completed treatment for conditions other than malignant neoplasm: Secondary | ICD-10-CM

## 2019-11-17 DIAGNOSIS — F17210 Nicotine dependence, cigarettes, uncomplicated: Secondary | ICD-10-CM | POA: Insufficient documentation

## 2019-11-17 DIAGNOSIS — S81802D Unspecified open wound, left lower leg, subsequent encounter: Secondary | ICD-10-CM

## 2019-11-17 DIAGNOSIS — E109 Type 1 diabetes mellitus without complications: Secondary | ICD-10-CM | POA: Insufficient documentation

## 2019-11-17 DIAGNOSIS — R21 Rash and other nonspecific skin eruption: Secondary | ICD-10-CM | POA: Insufficient documentation

## 2019-11-17 DIAGNOSIS — T63301D Toxic effect of unspecified spider venom, accidental (unintentional), subsequent encounter: Secondary | ICD-10-CM

## 2019-11-17 LAB — CBC
HCT: 46.9 % (ref 39.0–52.0)
Hemoglobin: 15.9 g/dL (ref 13.0–17.0)
MCH: 31.4 pg (ref 26.0–34.0)
MCHC: 33.9 g/dL (ref 30.0–36.0)
MCV: 92.5 fL (ref 80.0–100.0)
Platelets: 273 10*3/uL (ref 150–400)
RBC: 5.07 MIL/uL (ref 4.22–5.81)
RDW: 12.3 % (ref 11.5–15.5)
WBC: 7.8 10*3/uL (ref 4.0–10.5)
nRBC: 0 % (ref 0.0–0.2)

## 2019-11-17 LAB — COMPREHENSIVE METABOLIC PANEL
ALT: 19 U/L (ref 0–44)
AST: 18 U/L (ref 15–41)
Albumin: 4.5 g/dL (ref 3.5–5.0)
Alkaline Phosphatase: 64 U/L (ref 38–126)
Anion gap: 8 (ref 5–15)
BUN: 14 mg/dL (ref 6–20)
CO2: 27 mmol/L (ref 22–32)
Calcium: 9.4 mg/dL (ref 8.9–10.3)
Chloride: 105 mmol/L (ref 98–111)
Creatinine, Ser: 0.96 mg/dL (ref 0.61–1.24)
GFR calc Af Amer: >60 mL/min (ref >60)
GFR calc non Af Amer: >60 mL/min (ref >60)
Glucose, Bld: 101 mg/dL — ABNORMAL HIGH (ref 70–99)
Potassium: 4.2 mmol/L (ref 3.5–5.1)
Sodium: 140 mmol/L (ref 135–145)
Total Bilirubin: 0.8 mg/dL (ref 0.3–1.2)
Total Protein: 7.4 g/dL (ref 6.5–8.1)

## 2019-11-17 LAB — LACTIC ACID, PLASMA: Lactic Acid, Venous: 1.3 mmol/L (ref 0.5–1.9)

## 2019-11-17 LAB — RAPID HIV SCREEN (HIV 1/2 AB+AG)
HIV 1/2 Antibodies: NONREACTIVE
HIV-1 P24 Antigen - HIV24: NONREACTIVE

## 2019-11-17 MED ORDER — DIPHENHYDRAMINE HCL 50 MG/ML IJ SOLN
25.0000 mg | Freq: Once | INTRAMUSCULAR | Status: AC
  Administered 2019-11-17: 25 mg via INTRAVENOUS
  Filled 2019-11-17: qty 1

## 2019-11-17 MED ORDER — DEXAMETHASONE SODIUM PHOSPHATE 10 MG/ML IJ SOLN
10.0000 mg | Freq: Once | INTRAMUSCULAR | Status: AC
  Administered 2019-11-17: 10 mg via INTRAVENOUS
  Filled 2019-11-17: qty 1

## 2019-11-17 MED ORDER — SODIUM CHLORIDE 0.9 % IV BOLUS
1000.0000 mL | Freq: Once | INTRAVENOUS | Status: AC
  Administered 2019-11-17: 19:00:00 1000 mL via INTRAVENOUS

## 2019-11-17 MED ORDER — SODIUM CHLORIDE 0.9 % IV BOLUS
1000.0000 mL | Freq: Once | INTRAVENOUS | Status: AC
  Administered 2019-11-17: 1000 mL via INTRAVENOUS

## 2019-11-17 MED ORDER — SULFAMETHOXAZOLE-TRIMETHOPRIM 800-160 MG PO TABS: 1.0000 | ORAL_TABLET | Freq: Two times a day (BID) | ORAL | 0 refills | Status: DC

## 2019-11-17 MED ORDER — PREDNISONE 10 MG PO TABS: ORAL_TABLET | ORAL | 0 refills | Status: DC

## 2019-11-17 MED ORDER — VANCOMYCIN HCL IN DEXTROSE 1-5 GM/200ML-% IV SOLN
1000.0000 mg | Freq: Once | INTRAVENOUS | Status: AC
  Administered 2019-11-17: 1000 mg via INTRAVENOUS
  Filled 2019-11-17: qty 200

## 2019-11-17 NOTE — ED Triage Notes (Signed)
Pt reports that he thought he had a spider bit on LLE back on 1/9 and was seen at [facility]. Was told that wasn't foresure that was a spider bite b/c didn't have the spider and no bite marks. he was put on antibiotics and finished taking them. Reports still having drainage from area and Monday started having rash on upper thigh that itches.

## 2019-11-17 NOTE — Progress Notes (Signed)
A consult was received from an ED physician for vancomycin per pharmacy dosing (for an indication other than meningitis). The patient's profile has been reviewed for ht/wt/allergies/indication/available labs. A one time order has been placed for the above antibiotics.  Further antibiotics/pharmacy consults should be ordered by admitting physician if indicated.                       Bernadene Person, PharmD, BCPS [phone] 11/17/2019, 6:38 PM

## 2019-11-17 NOTE — Discharge Instructions (Signed)
You were evaluated in the Emergency Department and after careful evaluation, we did not find any emergent condition requiring admission or further testing in the hospital.  Your rash seems to be due to a vasculitis.  It is very important that you follow-up with a dermatologist as soon as possible, please call the number provided to set up an appointment.  Please take the prednisone medication as directed starting tomorrow.  We are also covering you for infection, please take the Bactrim medication as well as directed.  Please return to the Emergency Department if you experience any worsening of your condition.  We encourage you to follow up with a primary care provider.  Thank you for allowing [place] to be a part of your care.

## 2019-11-17 NOTE — Patient Instructions (Signed)
Bacitracin; Neomycin; Polymyxin B eye ointment What is this medicine? BACITRACIN; NEOMYCIN; POLYMYXIN (bass i TRAY sin; nee oh MYE sin; pol i MIX in) is used to treat eye infections. This medicine may be used for other purposes; ask your health care provider or pharmacist if you have questions. COMMON BRAND NAME(S): Neo-Polycin, Neocidin, Neosporin, Ocu-Spore-B, Polymycin What should I tell my health care provider before I take this medicine? They need to know if you have any of these conditions:  glaucoma  recent cataract surgery  wear contact lenses  an unusual or allergic reaction to this bacitracin, neomycin, polymyxin B, other medicines, foods, dyes, or preservatives  pregnant or trying to get pregnant  breast-feeding How should I use this medicine? This medicine is only for use in the eye. Follow the directions on the prescription label. Wash hands before and after use. Tilt your head back slightly and pull your lower eyelid down with your index finger to form a pouch. Try not to touch the tip of the tube, to your eye, fingertips, or any other surface. Squeeze the end of the tube to apply a thin layer of the ointment to the inside of the lower eyelid. Close the eye gently to spread the ointment. Your vision may blur for a few minutes. Do not use your medicine more often than directed. Finish the full course prescribed by your doctor or health care professional even if you think your condition is better. Do not stop using except on the advice of your doctor or health care professional. Talk to your pediatrician regarding the use of this medicine in children. Special care may be needed. Overdosage: If you think you have taken too much of this medicine contact a poison control center or emergency room at once. NOTE: This medicine is only for you. Do not share this medicine with others. What if I miss a dose? If you miss a dose, use it as soon as you can. If it is almost time for your next  dose, use only that dose. Do not use double or extra doses. What may interact with this medicine? Interactions are not expected. Do not use any other eye products without telling your doctor or health care professional. This list may not describe all possible interactions. Give your health care provider a list of all the medicines, herbs, non-prescription drugs, or dietary supplements you use. Also tell them if you smoke, drink alcohol, or use illegal drugs. Some items may interact with your medicine. What should I watch for while using this medicine? Tell your doctor or health care professional if your symptoms do not get better or if they get worse. If you wear contact lenses, ask your doctor or health care professional when you can use your lenses again. To prevent the spread of infection, do not share eye products, towels, and washcloths with anyone else. Throw away any unused eye products. What side effects may I notice from receiving this medicine? Side effects that you should report to your doctor or health care professional as soon as possible:  allergic reactions like skin rash, itching or hives, swelling of the face, lips, or tongue  changes in vision  eye irritation, pain, sensitive to light Side effects that usually do not require medical attention (report to your doctor or health care professional if they continue or are bothersome):  eye burning, stinging, irritation This list may not describe all possible side effects. Call your doctor for medical advice about side effects. You may report  side effects to FDA at 1-800-FDA-1088. Where should I keep my medicine? Keep out of the reach of children. Store at room temperature between 15 and 30 degrees C (59 and 86 degrees F). Throw away any unused medicine after the expiration date. NOTE: This sheet is a summary. It may not cover all possible information. If you have questions about this medicine, talk to your doctor, pharmacist, or  health care provider.  2020 Elsevier/Gold Standard (2008-01-14 14:27:45) Spider Bite Spider bites are not common. Most spider bites do not cause serious problems. There are only a few types of spider bites that can cause serious health problems. What are the causes? This condition is caused when you make contact with a spider in a way that traps the spider against your skin. What are the signs or symptoms? Some spider bites may cause symptoms within 1 hour after the bite. For other spider bites, it may take 1-2 days for symptoms to appear. Symptoms include:  A raised area that is red.  Redness and swelling around the area of the bite.  Pain in the area of the bite. A few types of spiders, such as the black widow spider or the brown recluse spider, can inject poison (venom) into a bite wound. This causes more serious symptoms. Symptoms of these bites vary, and may include:  Muscle cramps.  Feeling sick to your stomach (nauseous).  Throwing up (vomiting).  Pain in your belly (abdomen).  A fever.  A skin sore (lesion) that spreads. This can break into an open wound (skin ulcer).  Feeling light-headed or dizzy. How is this treated? Many spider bites do not need treatment. If needed, treatment may include:  Icing and keeping the bite area raised (elevated).  Taking or applying over-the-counter or prescription medicines to help with symptoms such as pain and itching.  Having a tetanus shot.  Taking antibiotic medicine. Follow these instructions at home: Medicines  Take or apply over-the-counter and prescription medicines only as told by your doctor.  If you were prescribed an antibiotic medicine, take it as told by your doctor. Do not stop using it even if you start to feel better. Managing pain and swelling   If told, put ice on the bite area. ? Put ice in a plastic bag. ? Place a towel between your skin and the bag. ? Leave the ice on for 20 minutes, 2-3 times a  day.  Raise the bite area above the level of your heart while you are sitting or lying down. General instructions   Do not scratch the bite area.  Keep the bite area clean and dry. Wash the bite area with soap and water each day as told by your doctor.  Keep all follow-up visits as told by your doctor. This is important. Contact a doctor if:  Your bite does not get better after 3 days.  Your bite turns black or purple.  Near the bite, you have more: ? Redness. ? Swelling. ? Pain. Get help right away if:  You get shortness of breath or chest pain.  You have fluid, blood, or pus coming from the bite area.  You have painful muscle cramps or sudden muscle tightening (spasms).  You have belly pain.  You feel sick to your stomach or you throw up.  You feel more tired or sleepy than normal. Summary  Spider bites are not common. When spider bites do happen, most do not cause serious health problems.  Take or apply all medicines  only as told by your doctor.  Keep the bite area clean and dry. Wash the bite area with soap and water each day as told by your doctor.  Contact a doctor if you have more redness, swelling, or pain near the bite.  Get help right away if you get shortness of breath or chest pain. This information is not intended to replace advice given to you by your health care provider. Make sure you discuss any questions you have with your health care provider. Document Revised: 05/26/2018 Document Reviewed: 05/26/2018 Elsevier Patient Education  2020 [employer].

## 2019-11-17 NOTE — Progress Notes (Signed)
[facility]  Hospital Follow Up  Subjective:  Patient ID: Zachary Vazquez, male    DOB: Jul 22, 1986  Age: 35 y.o. MRN: [id number]  CC:  Chief Complaint  Patient presents with  . Wound Check    Left lower wound - Seen in ED 11/06/2019    HPI Zachary Vazquez is a 34 year old who presents for Hospital Follow Up today.   Past Medical History:  Diagnosis Date  . Type 1 diabetes mellitus (HCC)    Current Status: Since his last office visit, he has had an ED visit on 11/04/2019 for an insect bite. Today, he c/o of increased pain and spread of bite mark on his lower left leg. Patient states that he believes that he probably was bit by a spider. He was treated with an antibiotic and given instructions to use Triple antibiotic on area. He has had decreased wound healing in area an wound has spread, as he is Type I Diabetic. He also has c/o upper leg rash and mild facial rash since he was bitten.  He denies fevers, chills, recent infections, weight loss, and night sweats. He has not had any headaches, dizziness, and falls. No chest pain, heart palpitations, cough and shortness of breath reported. No reports of GI problems such as nausea, vomiting, diarrhea, and constipation. He has no reports of blood in stools, dysuria and hematuria. His anxiety is moderated today r/t spider bite. He denies fatigue, frequent urination, blurred vision, excessive hunger, excessive thirst, weight gain, weight loss. He continues to check his feet regularly.   Past Surgical History:  Procedure Laterality Date  . TONSILLECTOMY      Family History  Problem Relation Age of Onset  . Hypertension Mother   . Diabetes Mellitus I Brother     Social History   Socioeconomic History  . Marital status: Single    Spouse name: Not on file  . Number of children: Not on file  . Years of education: Not on file  . Highest education level: Not on file  Occupational History  . Not on  file  Tobacco Use  . Smoking status: Current Every Day Smoker    Packs/day: 1.00    Years: 15.00    Pack years: 15.00    Types: Cigarettes  . Smokeless tobacco: Never Used  Substance and Sexual Activity  . Alcohol use: No  . Drug use: Yes    Types: Marijuana    Comment: occ  . Sexual activity: Yes    Birth control/protection: None  Other Topics Concern  . Not on file  Social History Narrative  . Not on file   Social Determinants of Health   Financial Resource Strain:   . Difficulty of Paying Living Expenses: Not on file  Food Insecurity:   . Worried About Programme researcher, broadcasting/film/video in the Last Year: Not on file  . Ran Out of Food in the Last Year: Not on file  Transportation Needs:   . Lack of Transportation (Medical): Not on file  . Lack of Transportation (Non-Medical): Not on file  Physical Activity:   . Days of Exercise per Week: Not on file  . Minutes of Exercise per Session: Not on file  Stress:   . Feeling of Stress : Not on file  Social Connections:   . Frequency of Communication with Friends and Family: Not on file  . Frequency of Social Gatherings with Friends and Family: Not on file  .  Attends Religious Services: Not on file  . Active Member of Clubs or Organizations: Not on file  . Attends Banker Meetings: Not on file  . Marital Status: Not on file  Intimate Partner Violence:   . Fear of Current or Ex-Partner: Not on file  . Emotionally Abused: Not on file  . Physically Abused: Not on file  . Sexually Abused: Not on file    Outpatient Medications Prior to Visit  Medication Sig Dispense Refill  . cephALEXin (KEFLEX) 500 MG capsule Take 1 capsule (500 mg total) by mouth 4 (four) times daily. (Patient not taking: Reported on 11/17/2019) 20 capsule 0  . insulin aspart (NOVOLOG) 100 UNIT/ML injection 5 units daily with breakfast, 7 units with lunch, 7 units with dinner. Hold short-acting insulin if blood sugar < 125. (Patient taking differently: Inject  5-20 Units into the skin 3 (three) times daily as needed for high blood sugar. ) 10 mL 11  . insulin glargine (LANTUS) 100 UNIT/ML injection Inject 0.3 mLs (30 Units total) into the skin daily. (Patient taking differently: Inject 30 Units into the skin at bedtime. ) 10 mL 11  . sildenafil (VIAGRA) 100 MG tablet Take 0.5-1 tablets (50-100 mg total) by mouth daily as needed for erectile dysfunction. 5 tablet 3   No facility-administered medications prior to visit.    Allergies  Allergen Reactions  . Clindamycin/Lincomycin Anaphylaxis    ROS Review of Systems  Constitutional: Negative.   HENT: Negative.   Eyes: Negative.   Respiratory: Negative.   Cardiovascular: Negative.   Gastrointestinal: Negative.   Endocrine: Negative.   Genitourinary: Negative.   Musculoskeletal:       Lower left leg pain  Skin: Positive for wound.       Lower left leg pain  Allergic/Immunologic: Negative.   Neurological: Negative.   Hematological: Negative.   Psychiatric/Behavioral: Negative.       Objective:    Physical Exam  Constitutional: He is oriented to person, place, and time. He appears well-developed and well-nourished.  HENT:  Head: Normocephalic and atraumatic.  Eyes: Conjunctivae are normal.  Cardiovascular: Normal rate, regular rhythm, normal heart sounds and intact distal pulses.  Pulmonary/Chest: Effort normal and breath sounds normal.  Abdominal: Soft. Bowel sounds are normal.  Musculoskeletal:        General: Normal range of motion.     Cervical back: Normal range of motion and neck supple.  Neurological: He is alert and oriented to person, place, and time.  Skin: Skin is warm and dry. There is erythema.  Lower left leg wound with swelling, pain and discomfort.   Psychiatric: He has a normal mood and affect. His behavior is normal. Judgment and thought content normal.  Nursing note and vitals reviewed.   BP 128/90   Pulse (!) 114   Temp 97.9 F (36.6 C) (Oral)   Ht 5'  11" (1.803 m)   Wt 191 lb (86.6 kg)   SpO2 100%   BMI 26.64 kg/m  Wt Readings from Last 3 Encounters:  11/17/19 191 lb (86.6 kg)  11/06/19 190 lb (86.2 kg)  10/04/19 192 lb 3.2 oz (87.2 kg)     Health Maintenance Due  Topic Date Due  . PNEUMOCOCCAL POLYSACCHARIDE VACCINE AGE 40-64 HIGH RISK  04/04/1989  . OPHTHALMOLOGY EXAM  04/04/1997  . TETANUS/TDAP  04/04/2006  . FOOT EXAM  12/22/2018  . INFLUENZA VACCINE  05/29/2019  . HEMOGLOBIN A1C  11/04/2019    There are no preventive care reminders  to display for this patient.  Lab Results  Component Value Date   TSH 2.410 12/22/2017   Lab Results  Component Value Date   WBC 7.8 11/17/2019   HGB 15.9 11/17/2019   HCT 46.9 11/17/2019   MCV 92.5 11/17/2019   PLT 273 11/17/2019   Lab Results  Component Value Date   NA 140 11/17/2019   K 4.2 11/17/2019   CO2 27 11/17/2019   GLUCOSE 101 (H) 11/17/2019   BUN 14 11/17/2019   CREATININE 0.96 11/17/2019   BILITOT 0.8 11/17/2019   ALKPHOS 64 11/17/2019   AST 18 11/17/2019   ALT 19 11/17/2019   PROT 7.4 11/17/2019   ALBUMIN 4.5 11/17/2019   CALCIUM 9.4 11/17/2019   ANIONGAP 8 11/17/2019   No results found for: CHOL No results found for: HDL No results found for: LDLCALC No results found for: TRIG No results found for: CHOLHDL Lab Results  Component Value Date   HGBA1C 6.9 (H) 05/04/2019      Assessment & Plan:   1. Hospital discharge follow-up  2. Spider bite wound, accidental or unintentional, subsequent encounter       Worsened. He is to report to ED today.    3. Wound of left lower extremity, subsequent encounter Dressing changed today.   4. Type 1 diabetes mellitus without complication ([place])  5. Follow up He will report to ED today for further assessment.  He will follow up with our office in 1 week.   No orders of the defined types were placed in this encounter.   No orders of the defined types were placed in this encounter.   Referral  Orders  No referral(s) requested today    Kathe Becton,  MSN, FNP-BC [facility] [address] [place], [place] [address] [phone]- fax  Problem List Items Addressed This Visit      Endocrine   Type 1 diabetes mellitus [facility])    Other Visit Diagnoses    Hospital discharge follow-up    -  Primary   Spider bite wound, accidental or unintentional, subsequent encounter       Wound of left lower extremity, subsequent encounter       Follow up          No orders of the defined types were placed in this encounter.   Follow-up: Return in about 1 week (around 11/24/2019).    Azzie Glatter, FNP

## 2019-11-17 NOTE — ED Provider Notes (Signed)
[facility]-EMERGENCY DEPT [facility] Emergency Department Provider Note MRN:  [id number]  Arrival date & time: 11/17/19     Chief Complaint   Recurrent Skin Infections   History of Present Illness   Zachary Vazquez is a 34 y.o. year-old male with a history of type 1 diabetes presenting to the ED with chief complaint of rash.  Worsening rash to the lower left leg, first noticed it on 1-9 as a small blister that he popped.  He was seen in the emergency department and was advised topical antibiotics.  Rash has continued to get worse, spreading, feeling general malaise, denies fever, no chest pain or shortness of breath, no abdominal pain.  Also noticing some rash to his upper leg today as well as his face.  Review of Systems  A complete 10 system review of systems was obtained and all systems are negative except as noted in the HPI and PMH.   Patient's Health History    Past Medical History:  Diagnosis Date  . Type 1 diabetes mellitus (HCC)     Past Surgical History:  Procedure Laterality Date  . TONSILLECTOMY      Family History  Problem Relation Age of Onset  . Hypertension Mother   . Diabetes Mellitus I Brother     Social History   Socioeconomic History  . Marital status: Single    Spouse name: Not on file  . Number of children: Not on file  . Years of education: Not on file  . Highest education level: Not on file  Occupational History  . Not on file  Tobacco Use  . Smoking status: Current Every Day Smoker    Packs/day: 1.00    Years: 15.00    Pack years: 15.00    Types: Cigarettes  . Smokeless tobacco: Never Used  Substance and Sexual Activity  . Alcohol use: No  . Drug use: Yes    Types: Marijuana    Comment: occ  . Sexual activity: Yes    Birth control/protection: None  Other Topics Concern  . Not on file  Social History Narrative  . Not on file   Social Determinants of Health   Financial Resource Strain:   . Difficulty of Paying Living Expenses: Not  on file  Food Insecurity:   . Worried About Programme researcher, broadcasting/film/video in the Last Year: Not on file  . Ran Out of Food in the Last Year: Not on file  Transportation Needs:   . Lack of Transportation (Medical): Not on file  . Lack of Transportation (Non-Medical): Not on file  Physical Activity:   . Days of Exercise per Week: Not on file  . Minutes of Exercise per Session: Not on file  Stress:   . Feeling of Stress : Not on file  Social Connections:   . Frequency of Communication with Friends and Family: Not on file  . Frequency of Social Gatherings with Friends and Family: Not on file  . Attends Religious Services: Not on file  . Active Member of Clubs or Organizations: Not on file  . Attends Banker Meetings: Not on file  . Marital Status: Not on file  Intimate Partner Violence:   . Fear of Current or Ex-Partner: Not on file  . Emotionally Abused: Not on file  . Physically Abused: Not on file  . Sexually Abused: Not on file     Physical Exam   Vitals:   11/17/19 2058 11/17/19 2100  BP:  128/88  Pulse: 94  94  Resp:  18  Temp:    SpO2: 96% 96%    CONSTITUTIONAL: Well-appearing, NAD NEURO:  Alert and oriented x 3, no focal deficits EYES:  eyes equal and reactive ENT/NECK:  no LAD, no JVD CARDIO: Tachycardic rate, well-perfused, normal S1 and S2 PULM:  CTAB no wheezing or rhonchi GI/GU:  normal bowel sounds, non-distended, non-tender MSK/SPINE:  No gross deformities, no edema SKIN: See pictures below PSYCH:  Appropriate speech and behavior  Media Information   Document Information  Photos    11/17/2019 18:37  Attached To:  Hospital Encounter on 11/17/19  Source Information  Isidore Margraf, Barth Kirks, MD  [facility]Emergency Dept   Media Information   Document Information  Photos    11/17/2019 18:38  Attached To:  Hospital Encounter on 11/17/19  Source Information  Sibyl Mikula, Barth Kirks, MD  [facility]    *Additional and/or pertinent findings included in  MDM below  Diagnostic and Interventional Summary    EKG Interpretation  Date/Time:    Ventricular Rate:    PR Interval:    QRS Duration:   QT Interval:    QTC Calculation:   R Axis:     Text Interpretation:        Labs Reviewed  COMPREHENSIVE METABOLIC PANEL - Abnormal; Notable for the following components:      Result Value   Glucose, Bld 101 (*)    All other components within normal limits  CULTURE, BLOOD (SINGLE)  CBC  LACTIC ACID, PLASMA  RAPID HIV SCREEN (HIV 1/2 AB+AG)  RPR    No orders to display    Medications  sodium chloride 0.9 % bolus 1,000 mL (0 mLs Intravenous Stopped 11/17/19 2057)  sodium chloride 0.9 % bolus 1,000 mL (0 mLs Intravenous Stopped 11/17/19 2010)  vancomycin (VANCOCIN) IVPB 1000 mg/200 mL premix (0 mg Intravenous Stopped 11/17/19 2010)  diphenhydrAMINE (BENADRYL) injection 25 mg (25 mg Intravenous Given 11/17/19 2055)  dexamethasone (DECADRON) injection 10 mg (10 mg Intravenous Given 11/17/19 2056)     Procedures  /  Critical Care Procedures  ED Course and Medical Decision Making  I have reviewed the triage vital signs, the nursing notes, and pertinent available records from the EMR.  Pertinent labs & imaging results that were available during my care of the patient were reviewed by me and considered in my medical decision making (see below for details).     Atypical ulcerative rash that does not seem quite consistent with a common cellulitis.  Considering a vasculitis, also considering tertiary syphilis, primary HIV.  Patient is tachycardic and feeling general malaise and is a type I diabetic, also considering DKA, will provide fluids, IV antibiotics, awaiting laboratory assessment, may need admission.  Labs are actually reassuring, no leukocytosis, no DKA.  Heart rate improved, tachycardia resolved with IV fluids.  HIV rapid test is negative, RPR is pending.  No indication for admission or further testing here in the emergency department.  He  has an ulcerative bright red rash on his leg that is favored to be due to a vasculitis, especially given the newer involvement to the upper leg.  Will provide prednisone taper and stressed the importance of dermatology follow-up.  Thought to be much less likely a infectious process, but will cover with Bactrim as well.  Barth Kirks. Sedonia Small, [place] [email or web address]  Final Clinical Impressions(s) / ED Diagnoses     ICD-10-CM   1. Rash  R21     ED  Discharge Orders         Ordered    sulfamethoxazole-trimethoprim (BACTRIM DS) 800-160 MG tablet  2 times daily     11/17/19 2141    predniSONE (DELTASONE) 10 MG tablet     11/17/19 2141           Discharge Instructions Discussed with and Provided to Patient:     Discharge Instructions     You were evaluated in the Emergency Department and after careful evaluation, we did not find any emergent condition requiring admission or further testing in the hospital.  Your rash seems to be due to a vasculitis.  It is very important that you follow-up with a dermatologist as soon as possible, please call the number provided to set up an appointment.  Please take the prednisone medication as directed starting tomorrow.  We are also covering you for infection, please take the Bactrim medication as well as directed.  Please return to the Emergency Department if you experience any worsening of your condition.  We encourage you to follow up with a primary care provider.  Thank you for allowing [place] to be a part of your care.       Sabas Sous, MD 11/17/19 2144

## 2019-11-18 ENCOUNTER — Telehealth: Payer: Self-pay

## 2019-11-18 ENCOUNTER — Other Ambulatory Visit: Payer: Self-pay | Admitting: Family Medicine

## 2019-11-18 DIAGNOSIS — E109 Type 1 diabetes mellitus without complications: Secondary | ICD-10-CM

## 2019-11-18 DIAGNOSIS — T63301D Toxic effect of unspecified spider venom, accidental (unintentional), subsequent encounter: Secondary | ICD-10-CM

## 2019-11-18 DIAGNOSIS — S81802A Unspecified open wound, left lower leg, initial encounter: Secondary | ICD-10-CM | POA: Insufficient documentation

## 2019-11-18 DIAGNOSIS — T63301A Toxic effect of unspecified spider venom, accidental (unintentional), initial encounter: Secondary | ICD-10-CM | POA: Insufficient documentation

## 2019-11-18 DIAGNOSIS — S81802D Unspecified open wound, left lower leg, subsequent encounter: Secondary | ICD-10-CM

## 2019-11-18 LAB — RPR: RPR Ser Ql: NONREACTIVE

## 2019-11-18 MED FILL — predniSONE 10 MG TABS: 10 | 12 days supply | Qty: 30 | Fill #0

## 2019-11-18 MED FILL — ?SULFAMETHOXAZOLE-TMP DS TB: 800-160 | 7 days supply | Qty: 14 | Fill #0

## 2019-11-18 NOTE — Telephone Encounter (Signed)
I was able to make an appointment with Dermatology on 11/22/2019.

## 2019-11-18 NOTE — Telephone Encounter (Signed)
I called and tried to get him in but now it will next Monday or Tuesday. Maybe you can get him in sooner?

## 2019-11-18 NOTE — Telephone Encounter (Signed)
Patient a seen in the ED yesterday. He stated he was given a steroid injection and advised to call Dermatology for an appointment. Patient advised to call today. Per patient the wound has not changed in appearance.

## 2019-11-18 NOTE — Telephone Encounter (Signed)
Wound care will see him on 12/03/2019. They will call him if there are any cancellations for a sooner appointment.

## 2019-11-18 NOTE — Telephone Encounter (Signed)
Sent to [facility] 

## 2019-11-18 NOTE — Telephone Encounter (Signed)
Mr. Bushey called back: Dermatology cannot see him this week unless you call (ph# [phone]). Otherwise it will be next week.

## 2019-11-18 NOTE — Telephone Encounter (Signed)
-----   Message from [name], FNP sent at 11/18/2019  8:07 AM EST ----- Regarding: "Spider Bite" Zachary Vazquez, please contact patient to assess treatment and status for spider bite. Thank you.

## 2019-11-19 ENCOUNTER — Telehealth: Payer: Self-pay

## 2019-11-19 NOTE — Telephone Encounter (Signed)
Patient has been given all details. He is doing well today. Patient advised to call the [facility] Care [facility] everyday for cancellations. As of now no cancellations.

## 2019-11-22 ENCOUNTER — Ambulatory Visit: Payer: Self-pay | Admitting: Family Medicine

## 2019-11-23 ENCOUNTER — Emergency Department (HOSPITAL_COMMUNITY): Payer: Self-pay

## 2019-11-23 ENCOUNTER — Observation Stay (HOSPITAL_COMMUNITY)
Admission: EM | Admit: 2019-11-23 | Discharge: 2019-11-24 | Disposition: A | Discharge status: Home / Self Care | Payer: Self-pay | Attending: Internal Medicine | Admitting: Internal Medicine

## 2019-11-23 ENCOUNTER — Other Ambulatory Visit: Payer: Self-pay

## 2019-11-23 ENCOUNTER — Encounter (HOSPITAL_COMMUNITY): Payer: Self-pay | Admitting: Emergency Medicine

## 2019-11-23 DIAGNOSIS — Z8249 Family history of ischemic heart disease and other diseases of the circulatory system: Secondary | ICD-10-CM | POA: Insufficient documentation

## 2019-11-23 DIAGNOSIS — E875 Hyperkalemia: Secondary | ICD-10-CM | POA: Insufficient documentation

## 2019-11-23 DIAGNOSIS — F17210 Nicotine dependence, cigarettes, uncomplicated: Secondary | ICD-10-CM | POA: Insufficient documentation

## 2019-11-23 DIAGNOSIS — R000 Tachycardia, unspecified: Secondary | ICD-10-CM | POA: Insufficient documentation

## 2019-11-23 DIAGNOSIS — Z881 Allergy status to other antibiotic agents status: Secondary | ICD-10-CM | POA: Insufficient documentation

## 2019-11-23 DIAGNOSIS — R739 Hyperglycemia, unspecified: Secondary | ICD-10-CM

## 2019-11-23 DIAGNOSIS — Z833 Family history of diabetes mellitus: Secondary | ICD-10-CM | POA: Insufficient documentation

## 2019-11-23 DIAGNOSIS — Z883 Allergy status to other anti-infective agents status: Secondary | ICD-10-CM

## 2019-11-23 DIAGNOSIS — E109 Type 1 diabetes mellitus without complications: Secondary | ICD-10-CM | POA: Diagnosis present

## 2019-11-23 DIAGNOSIS — Z794 Long term (current) use of insulin: Secondary | ICD-10-CM | POA: Insufficient documentation

## 2019-11-23 DIAGNOSIS — R21 Rash and other nonspecific skin eruption: Secondary | ICD-10-CM | POA: Diagnosis present

## 2019-11-23 DIAGNOSIS — E10622 Type 1 diabetes mellitus with other skin ulcer: Secondary | ICD-10-CM | POA: Insufficient documentation

## 2019-11-23 DIAGNOSIS — L259 Unspecified contact dermatitis, unspecified cause: Principal | ICD-10-CM | POA: Insufficient documentation

## 2019-11-23 DIAGNOSIS — Z7952 Long term (current) use of systemic steroids: Secondary | ICD-10-CM | POA: Insufficient documentation

## 2019-11-23 DIAGNOSIS — L97921 Non-pressure chronic ulcer of unspecified part of left lower leg limited to breakdown of skin: Secondary | ICD-10-CM | POA: Insufficient documentation

## 2019-11-23 DIAGNOSIS — S81802A Unspecified open wound, left lower leg, initial encounter: Secondary | ICD-10-CM | POA: Diagnosis present

## 2019-11-23 DIAGNOSIS — W57XXXD Bitten or stung by nonvenomous insect and other nonvenomous arthropods, subsequent encounter: Secondary | ICD-10-CM | POA: Insufficient documentation

## 2019-11-23 DIAGNOSIS — Z20822 Contact with and (suspected) exposure to covid-19: Secondary | ICD-10-CM | POA: Insufficient documentation

## 2019-11-23 LAB — COMPREHENSIVE METABOLIC PANEL
ALT: 14 U/L (ref 0–44)
AST: 12 U/L — ABNORMAL LOW (ref 15–41)
Albumin: 4 g/dL (ref 3.5–5.0)
Alkaline Phosphatase: 59 U/L (ref 38–126)
Anion gap: 11 (ref 5–15)
BUN: 16 mg/dL (ref 6–20)
CO2: 24 mmol/L (ref 22–32)
Calcium: 9.3 mg/dL (ref 8.9–10.3)
Chloride: 100 mmol/L (ref 98–111)
Creatinine, Ser: 1.18 mg/dL (ref 0.61–1.24)
GFR calc Af Amer: >60 mL/min (ref >60)
GFR calc non Af Amer: >60 mL/min (ref >60)
Glucose, Bld: 274 mg/dL — ABNORMAL HIGH (ref 70–99)
Potassium: 5.8 mmol/L — ABNORMAL HIGH (ref 3.5–5.1)
Sodium: 135 mmol/L (ref 135–145)
Total Bilirubin: 1.4 mg/dL — ABNORMAL HIGH (ref 0.3–1.2)
Total Protein: 6.8 g/dL (ref 6.5–8.1)

## 2019-11-23 LAB — GLUCOSE, CAPILLARY: Glucose-Capillary: 213 mg/dL — ABNORMAL HIGH (ref 70–99)

## 2019-11-23 LAB — CBC WITH DIFFERENTIAL/PLATELET
Abs Immature Granulocytes: 0.02 10*3/uL (ref 0.00–0.07)
Basophils Absolute: 0.1 10*3/uL (ref 0.0–0.1)
Basophils Relative: 1 %
Eosinophils Absolute: 0.5 10*3/uL (ref 0.0–0.5)
Eosinophils Relative: 8 %
HCT: 49.9 % (ref 39.0–52.0)
Hemoglobin: 16.8 g/dL (ref 13.0–17.0)
Immature Granulocytes: 0 %
Lymphocytes Relative: 24 %
Lymphs Abs: 1.6 10*3/uL (ref 0.7–4.0)
MCH: 31.3 pg (ref 26.0–34.0)
MCHC: 33.7 g/dL (ref 30.0–36.0)
MCV: 92.9 fL (ref 80.0–100.0)
Monocytes Absolute: 0.5 10*3/uL (ref 0.1–1.0)
Monocytes Relative: 7 %
Neutro Abs: 4 10*3/uL (ref 1.7–7.7)
Neutrophils Relative %: 60 %
Platelets: 257 10*3/uL (ref 150–400)
RBC: 5.37 MIL/uL (ref 4.22–5.81)
RDW: 12.2 % (ref 11.5–15.5)
WBC: 6.6 10*3/uL (ref 4.0–10.5)
nRBC: 0 % (ref 0.0–0.2)

## 2019-11-23 LAB — SEDIMENTATION RATE: Sed Rate: 1 mm/hr (ref 0–16)

## 2019-11-23 LAB — CULTURE, BLOOD (SINGLE)
Culture: NO GROWTH
Special Requests: ADEQUATE

## 2019-11-23 LAB — CBG MONITORING, ED: Glucose-Capillary: 266 mg/dL — ABNORMAL HIGH (ref 70–99)

## 2019-11-23 LAB — URINALYSIS, ROUTINE W REFLEX MICROSCOPIC
Bacteria, UA: NONE SEEN
Bilirubin Urine: NEGATIVE
Glucose, UA: >=500 mg/dL — AB
Hgb urine dipstick: NEGATIVE
Ketones, ur: 20 mg/dL — AB
Leukocytes,Ua: NEGATIVE
Nitrite: NEGATIVE
Protein, ur: NEGATIVE mg/dL
Specific Gravity, Urine: 1.022 (ref 1.005–1.030)
pH: 5 (ref 5.0–8.0)

## 2019-11-23 LAB — HEMOGLOBIN A1C
Hgb A1c MFr Bld: 6.7 % — ABNORMAL HIGH (ref 4.8–5.6)
Mean Plasma Glucose: 145.59 mg/dL

## 2019-11-23 LAB — SARS CORONAVIRUS 2 (TAT 6-24 HRS): SARS Coronavirus 2: NEGATIVE

## 2019-11-23 LAB — LACTIC ACID, PLASMA: Lactic Acid, Venous: 1.1 mmol/L (ref 0.5–1.9)

## 2019-11-23 LAB — C-REACTIVE PROTEIN: CRP: 0.6 mg/dL (ref <1.0)

## 2019-11-23 MED ORDER — SODIUM CHLORIDE 0.9 % IV SOLN: INTRAVENOUS | Status: AC

## 2019-11-23 MED ORDER — PREDNISONE 20 MG PO TABS
40.0000 mg | ORAL_TABLET | Freq: Once | ORAL | Status: AC
  Administered 2019-11-23: 40 mg via ORAL
  Filled 2019-11-23: qty 2

## 2019-11-23 MED ORDER — PREDNISONE 20 MG PO TABS
40.0000 mg | ORAL_TABLET | Freq: Every day | ORAL | Status: DC
  Administered 2019-11-24: 40 mg via ORAL
  Filled 2019-11-23: qty 2

## 2019-11-23 MED ORDER — ACETAMINOPHEN 325 MG PO TABS: 650.0000 mg | ORAL_TABLET | Freq: Four times a day (QID) | ORAL | Status: DC | PRN

## 2019-11-23 MED ORDER — PIPERACILLIN-TAZOBACTAM 3.375 G IVPB
3.3750 g | Freq: Three times a day (TID) | INTRAVENOUS | Status: DC
  Administered 2019-11-23: 3.375 g via INTRAVENOUS
  Filled 2019-11-23 (×3): qty 50

## 2019-11-23 MED ORDER — IBUPROFEN 400 MG PO TABS: 400.0000 mg | ORAL_TABLET | Freq: Four times a day (QID) | ORAL | Status: DC | PRN

## 2019-11-23 MED ORDER — VANCOMYCIN HCL 2000 MG/400ML IV SOLN
2000.0000 mg | Freq: Once | INTRAVENOUS | Status: AC
  Administered 2019-11-23: 2000 mg via INTRAVENOUS
  Filled 2019-11-23: qty 400

## 2019-11-23 MED ORDER — SODIUM POLYSTYRENE SULFONATE 15 GM/60ML PO SUSP
30.0000 g | Freq: Once | ORAL | Status: AC
  Administered 2019-11-23: 30 g via ORAL
  Filled 2019-11-23: qty 120

## 2019-11-23 MED ORDER — VANCOMYCIN HCL 1250 MG/250ML IV SOLN
1250.0000 mg | Freq: Two times a day (BID) | INTRAVENOUS | Status: DC
  Filled 2019-11-23: qty 250

## 2019-11-23 MED ORDER — SODIUM CHLORIDE 0.9 % IV BOLUS
1000.0000 mL | Freq: Once | INTRAVENOUS | Status: AC
  Administered 2019-11-23: 1000 mL via INTRAVENOUS

## 2019-11-23 MED ORDER — ACETAMINOPHEN 650 MG RE SUPP: 650.0000 mg | Freq: Four times a day (QID) | RECTAL | Status: DC | PRN

## 2019-11-23 MED ORDER — INSULIN GLARGINE 100 UNIT/ML ~~LOC~~ SOLN
24.0000 [IU] | Freq: Every day | SUBCUTANEOUS | Status: DC
  Administered 2019-11-23: 24 [IU] via SUBCUTANEOUS
  Filled 2019-11-23 (×2): qty 0.24

## 2019-11-23 MED ORDER — INSULIN ASPART 100 UNIT/ML ~~LOC~~ SOLN
0.0000 [IU] | Freq: Three times a day (TID) | SUBCUTANEOUS | Status: DC
  Administered 2019-11-24 (×2): 7 [IU] via SUBCUTANEOUS

## 2019-11-23 MED ORDER — DIPHENHYDRAMINE HCL 25 MG PO CAPS: 25.0000 mg | ORAL_CAPSULE | Freq: Four times a day (QID) | ORAL | Status: DC | PRN

## 2019-11-23 NOTE — ED Notes (Signed)
Pt transported to Rm. 5c16 in NAD with all belongings via cart transport by tech. Pt alert, speaking in full sentences. Breathing easy, non-labored. VSS. Equal rise and fall of chest noted

## 2019-11-23 NOTE — ED Notes (Signed)
x1 unsuccessful attempt to call report. RN unavailable at this time 

## 2019-11-23 NOTE — ED Triage Notes (Signed)
Pt arrives with swollen eyes and rash to face, hand and thigh. Pt was seen at [facility] for spider bite and given IV abx. Started on bactrim and prednisone but pt states he stopped taking when he noticed eye swelling.

## 2019-11-23 NOTE — ED Notes (Signed)
Pt able to void 700 cc of urine. Urine collected, labeled with 2 pt identifiers, and sent to lab

## 2019-11-23 NOTE — Progress Notes (Signed)
Pharmacy Antibiotic Note  Zachary Vazquez is a 34 y.o. male admitted on 11/23/2019 with cellulitis.  Patient presented with swollen eyes and rash to face, hand, and thigh. Patient was recently seen at [facility] for an spider bite and was given Vancomycin 1 gm IV x1 dose on 1/20. Patient was discharged on Bactrim 800-160 mg (1 tablet PO BID) and prednisone taper.  Pharmacy has been consulted for zosyn and vancomycin dosing.  Plan: Zosyn 3.375g IV q8h (4 hour infusion).  Vancomycin 2000 gm x1 dose; then Vancomycin 1250 mg IV Q 12 hrs.   - Goal AUC 400-550.  - Expected AUC: 478.2  - SCr used: 1.18  - Obtain vancomycin levels as needed  Monitor clinical improvement and renal function  Temp (24hrs), Avg:98.2 F (36.8 C), Min:98.2 F (36.8 C), Max:98.2 F (36.8 C)  Recent Labs  Lab 11/17/19 1855 11/23/19 1544 11/23/19 1545  WBC 7.8 6.6  --   CREATININE 0.96 1.18  --   LATICACIDVEN 1.3  --  1.1    Estimated Creatinine Clearance: 95.7 mL/min (by C-G formula based on SCr of 1.18 mg/dL).    Allergies  Allergen Reactions  . Clindamycin/Lincomycin Anaphylaxis    Antimicrobials this admission: 1/26 Vancomycin >>  1/26 Zosyn >>   Dose adjustments this admission:  Thank you for allowing pharmacy to be a part of this patient's care.  Charlett Nose, PharmD  PGY1 Acute Care Pharmacy Resident 11/23/2019 4:40 PM

## 2019-11-23 NOTE — ED Notes (Signed)
Assumed care of pt. Pt alert, resting on cart in NAD. VSS on monitors. PT breathing easy, non-labored. Call light within reach. Hourly rounds completed

## 2019-11-23 NOTE — ED Notes (Signed)
Report given to Eunice, RN. All questions answered.  

## 2019-11-23 NOTE — ED Notes (Signed)
Meds and fluid bolus initiated per MAR. Name/DOB verified with pt

## 2019-11-23 NOTE — ED Provider Notes (Addendum)
[facility] EMERGENCY DEPARTMENT Provider Note   CSN: [id number] Arrival date & time: 11/23/19  1432     History Chief Complaint  Patient presents with  . Rash    Zachary Vazquez is a 34 y.o. male with history of tobacco abuse and T1DM who presents to the ED with complaints of developing rash since last ED visit. Patient reports that he first noted a yellow blister to the inner left lower leg shortly after the turn of the new year, he popped this area and then noted surrounding erythema which prompted ED visit. Seen 11/06/19- tetanus updated, started on Keflex, took this without much change, area seemed to worsen with general malaise as well as rash to L upper thigh prompting second ED visit. Seen 11/17/19- had thorough work-up including negative HIV, RPR, & single blood culture, given IV vancomycin, decadron, & fluids, discharged home with prednisone & Bactrim. He was taking prescribed medicines for about 3 days and then seemed to develop a rash to the hands and to the face with bilateral periorbital swelling, he has not taken the medicines over the past 2 days due to sxs & came in due to lack of improvement. Reports continued malaise, nausea, with poor appetite. Not a contact lens wearer. No blurry vision.  Denies fever, chills, URI symptoms, cough, sensation of throat closing, dyspnea, chest pain, abdominal pain, or diarrhea.  He states due to his poor p.o. intake he has adjusted his insulin regimen, he did take his long-acting insulin last night as well as 3 units of fast acting insulin.  Has not had any insulin today.  No p.o. intake today.  He is sexually active with multiple male partners and states that he consistently uses protection.  HPI     Past Medical History:  Diagnosis Date  . Type 1 diabetes mellitus [facility])     Patient Active Problem List   Diagnosis Date Noted  . Spider bite 11/18/2019  . Wound of left leg 11/18/2019  . Nausea & vomiting 05/04/2019  .  DKA, type 1 (HCC) 05/04/2019  . DKA (diabetic ketoacidoses) (HCC) 05/04/2019  . Nausea and vomiting 12/02/2017  . Tobacco use disorder 03/30/2017  . Type 1 diabetes mellitus (HCC) 03/30/2017  . Leukocytosis 03/30/2017    Past Surgical History:  Procedure Laterality Date  . TONSILLECTOMY         Family History  Problem Relation Age of Onset  . Hypertension Mother   . Diabetes Mellitus I Brother     Social History   Tobacco Use  . Smoking status: Current Every Day Smoker    Packs/day: 1.00    Years: 15.00    Pack years: 15.00    Types: Cigarettes  . Smokeless tobacco: Never Used  Substance Use Topics  . Alcohol use: No  . Drug use: Yes    Types: Marijuana    Comment: occ    Home Medications Prior to Admission medications   Medication Sig Start Date End Date Taking? Authorizing Provider  cephALEXin (KEFLEX) 500 MG capsule Take 1 capsule (500 mg total) by mouth 4 (four) times daily. Patient not taking: Reported on 11/17/2019 11/06/19   Lorelee New, PA-C  insulin aspart (NOVOLOG) 100 UNIT/ML injection 5 units daily with breakfast, 7 units with lunch, 7 units with dinner. Hold short-acting insulin if blood sugar < 125. Patient taking differently: Inject 5-20 Units into the skin 3 (three) times daily as needed for high blood sugar.  12/15/18   Raliegh Ip  M, FNP  insulin glargine (LANTUS) 100 UNIT/ML injection Inject 0.3 mLs (30 Units total) into the skin daily. Patient taking differently: Inject 30 Units into the skin at bedtime.  12/15/18   [name], FNP  predniSONE (DELTASONE) 10 MG tablet Take 4 tablets once a day for 3 days. Take 3 tablets once a day for the next 3 days. Take 2 tablets once a day for the next 3 days. Take 1 tablet once a day for the next 3 days. 11/17/19   Sabas Sous, MD  sildenafil (VIAGRA) 100 MG tablet Take 0.5-1 tablets (50-100 mg total) by mouth daily as needed for erectile dysfunction. 10/30/18   [name], FNP    sulfamethoxazole-trimethoprim (BACTRIM DS) 800-160 MG tablet Take 1 tablet by mouth 2 (two) times daily for 7 days. 11/17/19 11/24/19  Sabas Sous, MD    Allergies    Clindamycin/lincomycin  Review of Systems   Review of Systems  Constitutional: Positive for appetite change. Negative for chills and fever.  HENT: Negative for congestion, ear pain, sore throat, trouble swallowing and voice change.   Eyes: Negative for visual disturbance.       Positive for bilateral periorbital swelling.  Respiratory: Negative for cough, shortness of breath and stridor.   Cardiovascular: Negative for chest pain.  Gastrointestinal: Positive for nausea. Negative for abdominal pain, blood in stool, constipation, diarrhea and vomiting.  Genitourinary: Negative for dysuria.  Skin: Positive for rash.  Neurological: Negative for syncope.  All other systems reviewed and are negative.   Physical Exam Updated Vital Signs BP (!) 137/93   Pulse (!) 130   Temp 98.2 F (36.8 C) (Oral)   Resp 18   SpO2 98%   Physical Exam Vitals and nursing note reviewed.  Constitutional:      General: He is not in acute distress.    Appearance: He is well-developed. He is ill-appearing. He is not toxic-appearing.  HENT:     Head: Normocephalic and atraumatic.     Nose: Nose normal.     Mouth/Throat:     Pharynx: Oropharynx is clear. Uvula midline. No oropharyngeal exudate or posterior oropharyngeal erythema.     Comments: There is some erythema with peeling to the left lower lip.  No intraoral lesions.  Posterior oropharynx is symmetric appearing. Patient tolerating own secretions without difficulty. No trismus. No drooling. No hot potato voice. No swelling beneath the tongue, submandibular compartment is soft. Airway is patent.   Eyes:     General:        Right eye: No discharge.        Left eye: No discharge.     Extraocular Movements: Extraocular movements intact.     Conjunctiva/sclera: Conjunctivae normal.      Pupils: Pupils are equal, round, and reactive to light.     Comments: No proptosis.   Cardiovascular:     Rate and Rhythm: Regular rhythm. Tachycardia present.     Comments: 2+ symmetric DP/PT pulses. Pulmonary:     Effort: Pulmonary effort is normal. No respiratory distress.     Breath sounds: Normal breath sounds. No wheezing, rhonchi or rales.  Abdominal:     General: There is no distension.     Palpations: Abdomen is soft.     Tenderness: There is no abdominal tenderness.  Genitourinary:    Pubic Area: No rash.      Penis: Normal and circumcised. No erythema, discharge or lesions.      Testes: Normal.  Musculoskeletal:     Cervical back: Neck supple. No rigidity.     Comments: Intact active range of motion throughout bilateral upper and lower extremities.  No point/focal bony tenderness.  Lymphadenopathy:     Cervical: No cervical adenopathy.  Skin:    General: Skin is warm and dry.     Comments: As pictured below. Face: small erythematous honey crusted lesions to periorbital region bilaterally as well as to the chin.  Hands: Small erythematous papules to the web spaces of the digits, no palm/sole involvement.  Left lower extremity: Proximal left medial thigh there are small nonblanching areas of erythema. Distal medial lower leg there is a large area of erythema with degree of blistering as well as a central area of purulence.   Neurological:     Mental Status: He is alert.     Comments: Clear speech.   Psychiatric:        Behavior: Behavior normal.                   ED Results / Procedures / Treatments   Labs (all labs ordered are listed, but only abnormal results are displayed) Labs Reviewed - No data to display  EKG None  Radiology No results found.  Procedures Procedures (including critical care time)  Medications Ordered in ED Medications - No data to display  ED Course  I have reviewed the triage vital signs and the nursing  notes.  Pertinent labs & imaging results that were available during my care of the patient were reviewed by me and considered in my medical decision making (see chart for details).  Zachary Vazquez was evaluated in Emergency Department on 11/23/2019 for the symptoms described in the history of present illness. He/she was evaluated in the context of the global COVID-19 pandemic, which necessitated consideration that the patient might be at risk for infection with the SARS-CoV-2 virus that causes COVID-19. Institutional protocols and algorithms that pertain to the evaluation of patients at risk for COVID-19 are in a state of rapid change based on information released by regulatory bodies including the CDC and federal and state organizations. These policies and algorithms were followed during the patient's care in the ED.    MDM Rules/Calculators/A&P                      Patient presents to the emergency department with variable rashes with initial skin lesion appearing at the beginning of this month.  Has been on Keflex, most recently placed on Bactrim and prednisone with development of new rashes to the hands and to the face with periorbital swelling.  Patient is nontoxic-appearing, however does appear somewhat ill, he is notably tachycardic and mildly hypertensive, do not suspect hypertensive emergency, vitals otherwise WNL.  Exam is described and pictured above.  Exam does not seem consistent with anaphylaxis, there is no urticaria, he is tolerating his own secretions without difficulty, airway is intact & patent.  Patient immediately staffed with supervising physician Dr. Roslynn Amble, in agreement with no epi, will start fluids and antibiotics and obtain labs.  CBC: No anemia or leukocytosis CMP: Hyperglycemia without acidosis or anion gap elevation, not consistent with DKA.  He is hyperkalemic, however renal function is normal, will obtain EKG, continue to hydrate with fluids. Lactic acid: WNL CRP:  WNL Left lower leg x-ray unremarkable  Patient having some heart rate improvement with fluid administration. Blood pressure (!) 135/96, pulse 98, temperature 98.2 F (36.8 C), temperature source Oral,  resp. rate 15, SpO2 98 %.  Unclear definitive etiology of several rashes, question worsening infection to the left lower leg, considered SJS with recent bactrim use but other than lower lip no mucous membrane or GU involvement, negative nikolsky sign, seems less likely currently but will need continued monitoring. Treated with abx & fluids thus far. Will discuss w/ hospitalist service for admission. This is a shared visit with supervising physician Dr. Stevie Kern who has independently evaluated patient & provided guidance in evaluation/management/disposition, in agreement with care   17:34: CONSULT: Discussed with hospitalist Dr. Luberta Robertson- accepts admission.   Re-discussed with Dr. Luberta Robertson- concern for possible allergic reaction to triple abx ointment as patient reported he was applying to areas of rash- requesting 40 mg of prednisone and for nursing staff to wash patient's face with warm soapy water. Appreciate consultation. Admission orders have been placed.   This is a shared visit with supervising physician Dr. Stevie Kern who has independently evaluated patient & provided guidance in evaluation/management/disposition, in agreement with care    Final Clinical Impression(s) / ED Diagnoses Final diagnoses:  Rash  Hyperglycemia    Rx / DC Orders ED Discharge Orders    None       Cherly Anderson, PA-C 11/23/19 1902    Desmond Lope 11/23/19 2031    Milagros Loll, MD 11/23/19 2203

## 2019-11-23 NOTE — H&P (Signed)
History and Physical:    Zachary Vazquez   WVP:[id number] DOB: 09/29/1986 DOA: 11/23/2019  Referring MD/provider: PA Russella Dar PCP: [name], FNP   Patient coming from: Home  Chief Complaint: Worsening rash  History of Present Illness:   Zachary Vazquez is an 34 y.o. male with past medical history significant for DM 1 with previous history of DKA who presents with worsening rash.  Patient's disease course started on 7 January when he had a small ulcer on his lower leg of unclear etiology.  He presented to the ED and was treated with antibiotics although it did not appear infected.  He subsequently returned on January 20 with a larger area of rash around the previously small ulcer now noted to have some crusting and vesicles.  He also had some vesicles on the fingers of his right and left hand.  He was worked up for vasculitis but an RPR HIV were negative.  He was treated with Bactrim and prednisone and discharged home.  Patient now presents with worsening of rash now involving his eyelids and perioral area and much worsening of his rash on his leg.  He notes he stopped taking his Bactrim and prednisone 2 days ago.  Patient states the rash on his face started 4 days ago when he noted tingling of his eyes.  He applied Neosporin/triple antibiotic cream to his eyes as well as to his lip area.  He continue to apply triple antibiotic ointment to his leg as he had been doing for the past 2 weeks.  Yesterday he noted some vesicles and crusting around his eyes but this morning when he woke up he was unable to open his eyes at all due to swelling.  Of note patient does continue to apply the triple antibiotic ointment twice a day.  Patient denies fevers or chills.  No malaise.  No real pain around the rash in his lower extremity.  No joint aches, no back pain, no penile discharge or chancres noted.  No abdominal pain, nausea vomiting or diarrhea.  No hematuria as far as he knows.  No  flank pain.  ED Course:  The patient was noted to be tachycardic.  Also noted to have significant periorbital edema with crusting.  Multiple excellent pictures are to be found in the ED note by PA Petrocelli.  ED attending did not feel that patient had Stevens-Johnson syndrome as there was no mucous membrane involvement whatsoever.  Patient was also breathing comfortably with no difficulty.  Patient was admitted for observation to make sure he did not worsen overnight.  ROS:   ROS   Review of Systems: As per HPI  Past Medical History:   Past Medical History:  Diagnosis Date  . Type 1 diabetes mellitus (HCC)     Past Surgical History:   Past Surgical History:  Procedure Laterality Date  . TONSILLECTOMY      Social History:   Social History   Socioeconomic History  . Marital status: Single    Spouse name: Not on file  . Number of children: Not on file  . Years of education: Not on file  . Highest education level: Not on file  Occupational History  . Not on file  Tobacco Use  . Smoking status: Current Every Day Smoker    Packs/day: 1.00    Years: 15.00    Pack years: 15.00    Types: Cigarettes  . Smokeless tobacco: Never Used  Substance and Sexual Activity  .  Alcohol use: No  . Drug use: Yes    Types: Marijuana    Comment: occ  . Sexual activity: Yes    Birth control/protection: None  Other Topics Concern  . Not on file  Social History Narrative  . Not on file   Social Determinants of Health   Financial Resource Strain:   . Difficulty of Paying Living Expenses: Not on file  Food Insecurity:   . Worried About Programme researcher, broadcasting/film/video in the Last Year: Not on file  . Ran Out of Food in the Last Year: Not on file  Transportation Needs:   . Lack of Transportation (Medical): Not on file  . Lack of Transportation (Non-Medical): Not on file  Physical Activity:   . Days of Exercise per Week: Not on file  . Minutes of Exercise per Session: Not on file  Stress:   .  Feeling of Stress : Not on file  Social Connections:   . Frequency of Communication with Friends and Family: Not on file  . Frequency of Social Gatherings with Friends and Family: Not on file  . Attends Religious Services: Not on file  . Active Member of Clubs or Organizations: Not on file  . Attends Banker Meetings: Not on file  . Marital Status: Not on file  Intimate Partner Violence:   . Fear of Current or Ex-Partner: Not on file  . Emotionally Abused: Not on file  . Physically Abused: Not on file  . Sexually Abused: Not on file    Allergies   Clindamycin/lincomycin  Family history:   Family History  Problem Relation Age of Onset  . Hypertension Mother   . Diabetes Mellitus I Brother     Current Medications:   Prior to Admission medications   Medication Sig Start Date End Date Taking? Authorizing Provider  diphenhydrAMINE (BENADRYL) 25 MG tablet Take 25 mg by mouth every 6 (six) hours as needed for itching or allergies.   Yes [provider]  insulin aspart (NOVOLOG) 100 UNIT/ML injection 5 units daily with breakfast, 7 units with lunch, 7 units with dinner. Hold short-acting insulin if blood sugar < 125. Patient taking differently: Inject 5-20 Units into the skin 3 (three) times daily as needed for high blood sugar.  12/15/18  Yes [name], FNP  insulin glargine (LANTUS) 100 UNIT/ML injection Inject 0.3 mLs (30 Units total) into the skin daily. Patient taking differently: Inject 30 Units into the skin at bedtime.  12/15/18  Yes [name], FNP  predniSONE (DELTASONE) 10 MG tablet Take 4 tablets once a day for 3 days. Take 3 tablets once a day for the next 3 days. Take 2 tablets once a day for the next 3 days. Take 1 tablet once a day for the next 3 days. Patient taking differently: Take 10-40 mg by mouth See admin instructions. Take 4 tablets once a day for 3 days. Take 3 tablets once a day for the next 3 days. Take 2 tablets once a  day for the next 3 days. Take 1 tablet once a day for the next 3 days. 11/17/19  Yes Sabas Sous, MD  sulfamethoxazole-trimethoprim (BACTRIM DS) 800-160 MG tablet Take 1 tablet by mouth 2 (two) times daily for 7 days. 11/17/19 11/24/19 Yes Sabas Sous, MD    Physical Exam:   Vitals:   11/23/19 1630 11/23/19 1700 11/23/19 1800 11/23/19 1830  BP: (!) 132/94 (!) 135/96  (!) 141/90  Pulse: 99 99 98  100  Resp: 15 15 15 17   Temp:      TempSrc:      SpO2: 98% 98% 98% 98%     Physical Exam: Blood pressure (!) 141/90, pulse 100, temperature 98.2 F (36.8 C), temperature source Oral, resp. rate 17, SpO2 98 %. Gen: Young man lying in bed with significant swelling and some erythema of his eyes bilaterally with vesicles and some crusting.  Patient with no respiratory distress, able to speak in full sentences. HEENT: Swelling and erythema as noted above.  There is really no tenderness palpation to this area.  He is unable to open his eyes due to the edema however when I open his eyes for him there is no conjunctival injection.  He has EOMI.  PERRL.  He also has some crusting in the perioral area around his beard and has some swelling of his lips.  Oropharynx with no involvement, no evidence of vesicles or erythema. CVS: S1-S2 clear, no murmur rubs or gallops, no LE edema, normal pedal pulses  Respiratory:  normal effort, symmetrical excursion, CTA without adventitious sounds.  GI: NABS, soft, NT, ND, no palpable masses.  LE: Patient has a round area of aggressive redness without tenderness or swelling around a shallow ulcer with fibrinous exudate.  There are vesicles around this area of redness. Neuro: A/O x 3, Moving all extremities equally with normal strength, CN 3-12 intact, grossly nonfocal.  Psych: patient is logical and coherent, judgement and insight appear normal, mood and affect appropriate to situation. Skin: Patient has a small streak of erythema with some vesicles at the base of his  thumb.  This is also not tender.  Data Review:    Labs: Basic Metabolic Panel: Recent Labs  Lab 11/17/19 1855 11/23/19 1544  NA 140 135  K 4.2 5.8*  CL 105 100  CO2 27 24  GLUCOSE 101* 274*  BUN 14 16  CREATININE 0.96 1.18  CALCIUM 9.4 9.3   Liver Function Tests: Recent Labs  Lab 11/17/19 1855 11/23/19 1544  AST 18 12*  ALT 19 14  ALKPHOS 64 59  BILITOT 0.8 1.4*  PROT 7.4 6.8  ALBUMIN 4.5 4.0   No results for input(s): LIPASE, AMYLASE in the last 168 hours. No results for input(s): AMMONIA in the last 168 hours. CBC: Recent Labs  Lab 11/17/19 1855 11/23/19 1544  WBC 7.8 6.6  NEUTROABS  --  4.0  HGB 15.9 16.8  HCT 46.9 49.9  MCV 92.5 92.9  PLT 273 257   Cardiac Enzymes: No results for input(s): CKTOTAL, CKMB, CKMBINDEX, TROPONINI in the last 168 hours.  BNP (last 3 results) No results for input(s): PROBNP in the last 8760 hours. CBG: Recent Labs  Lab 11/23/19 1501  GLUCAP 266*    Urinalysis    Component Value Date/Time   COLORURINE YELLOW 05/03/2019 0053   APPEARANCEUR CLEAR 05/03/2019 0053   LABSPEC 1.022 05/03/2019 0053   PHURINE 5.0 05/03/2019 0053   GLUCOSEU >=500 (A) 05/03/2019 0053   HGBUR NEGATIVE 05/03/2019 0053   BILIRUBINUR Negative 10/04/2019 1540   KETONESUR 80 (A) 05/03/2019 0053   PROTEINUR Positive (A) 10/04/2019 1540   PROTEINUR 30 (A) 05/03/2019 0053   UROBILINOGEN 2.0 (A) 10/04/2019 1540   UROBILINOGEN 0.2 01/19/2018 0838   NITRITE Negative 10/04/2019 1540   NITRITE NEGATIVE 05/03/2019 0053   LEUKOCYTESUR Negative 10/04/2019 1540   LEUKOCYTESUR NEGATIVE 05/03/2019 0053      Radiographic Studies: DG Tibia/Fibula Left  Result Date: 11/23/2019 CLINICAL DATA:  Status post spider bite with subsequent swollen eyes and rash to face, hands and thigh. EXAM: LEFT TIBIA AND FIBULA - 2 VIEW COMPARISON:  None. FINDINGS: There is no evidence of fracture or other focal bone lesions. Soft tissues are unremarkable. IMPRESSION:  Negative. Electronically Signed   By: Aram Candela M.D.   On: 11/23/2019 16:15    EKG: Independently reviewed.  Ordered and pending   Assessment/Plan:   Principal Problem:   Rash and nonspecific skin eruption Active Problems:   DKA, type 1 (HCC)   Wound of left leg  34 year old man comes in with progressively worsening rash over the past 4 days involving his eyes, perioral area some areas of his hand and his leg where he had previously had what appears to be a spider bite.  Of note these rashes have only occurred on areas where he is placed triple antibiotic ointment, he has been applying this appointment over the past 3 days.  RASH I suspect this rash is secondary to an allergy to polymyxin/Neosporin Of note apparently 37 to 40% the population is in fact allergic to Neosporin I do not suspect patient has ongoing infection given no fevers chills malaise or leukocytosis and that he is already had 2 courses of oral antibiotics. I am discontinuing vancomycin and Zosyn that was started in the ED. I also do not think this patient is having a vasculitis given no systemic symptoms at all. Prednisone 40 mg daily for treatment of facial swelling and periorbital rash.  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 Code Status: Full Family Communication: No need to contact family per patient Disposition Plan: Home Consults called: None Admission status: Observation  Asante Ritacco Tublu Lache Dagher [facility]  If 7PM-7AM, please contact night-coverage [email or web address] Password [facility] 11/23/2019, 7:11 PM

## 2019-11-24 DIAGNOSIS — K121 Other forms of stomatitis: Secondary | ICD-10-CM

## 2019-11-24 DIAGNOSIS — E109 Type 1 diabetes mellitus without complications: Secondary | ICD-10-CM

## 2019-11-24 DIAGNOSIS — Z881 Allergy status to other antibiotic agents status: Secondary | ICD-10-CM

## 2019-11-24 DIAGNOSIS — R21 Rash and other nonspecific skin eruption: Secondary | ICD-10-CM

## 2019-11-24 DIAGNOSIS — F17210 Nicotine dependence, cigarettes, uncomplicated: Secondary | ICD-10-CM

## 2019-11-24 LAB — BASIC METABOLIC PANEL
Anion gap: 12 (ref 5–15)
BUN: 16 mg/dL (ref 6–20)
CO2: 20 mmol/L — ABNORMAL LOW (ref 22–32)
Calcium: 8.4 mg/dL — ABNORMAL LOW (ref 8.9–10.3)
Chloride: 101 mmol/L (ref 98–111)
Creatinine, Ser: 0.91 mg/dL (ref 0.61–1.24)
GFR calc Af Amer: >60 mL/min (ref >60)
GFR calc non Af Amer: >60 mL/min (ref >60)
Glucose, Bld: 266 mg/dL — ABNORMAL HIGH (ref 70–99)
Potassium: 4.6 mmol/L (ref 3.5–5.1)
Sodium: 133 mmol/L — ABNORMAL LOW (ref 135–145)

## 2019-11-24 LAB — CBC
HCT: 43.2 % (ref 39.0–52.0)
Hemoglobin: 14.3 g/dL (ref 13.0–17.0)
MCH: 30.5 pg (ref 26.0–34.0)
MCHC: 33.1 g/dL (ref 30.0–36.0)
MCV: 92.1 fL (ref 80.0–100.0)
Platelets: 273 10*3/uL (ref 150–400)
RBC: 4.69 MIL/uL (ref 4.22–5.81)
RDW: 12 % (ref 11.5–15.5)
WBC: 7.9 10*3/uL (ref 4.0–10.5)
nRBC: 0 % (ref 0.0–0.2)

## 2019-11-24 LAB — GLUCOSE, CAPILLARY
Glucose-Capillary: 220 mg/dL — ABNORMAL HIGH (ref 70–99)
Glucose-Capillary: 221 mg/dL — ABNORMAL HIGH (ref 70–99)

## 2019-11-24 MED ORDER — INSULIN GLARGINE 100 UNIT/ML ~~LOC~~ SOLN
30.0000 [IU] | Freq: Every day | SUBCUTANEOUS | Status: DC
  Filled 2019-11-24: qty 0.3

## 2019-11-24 MED ORDER — INSULIN ASPART 100 UNIT/ML ~~LOC~~ SOLN
5.0000 [IU] | Freq: Three times a day (TID) | SUBCUTANEOUS | Status: DC
  Administered 2019-11-24: 5 [IU] via SUBCUTANEOUS

## 2019-11-24 MED ORDER — PREDNISONE 10 MG PO TABS: ORAL_TABLET | ORAL | 0 refills | Status: DC

## 2019-11-24 NOTE — Discharge Summary (Signed)
Physician Discharge Summary  Zachary Vazquez XBJ:[id number] DOB: [birth year] DOA: 11/23/2019  PCP: [name], FNP  Admit date: 11/23/2019 Discharge date: 11/24/2019  Admitted From: Home Discharge disposition: Home   Recommendations for Outpatient Follow-Up:   1. Stop using triple antibiotic ointment 2. Wound care 3. Close follow-up with PCP to ensure healing and resolution of contact dermatitis 4. Slow steroid taper so you will need to adjust your insulin while on steroids   Discharge Diagnosis:   Principal Problem:   Rash and nonspecific skin eruption Active Problems:   Type 1 diabetes mellitus (HCC)   Wound of left leg    Discharge Condition: Improved.  Diet recommendation: Carbohydrate-modified.  Wound care: Place Xeroform gauze [place] [phone]) over the leg wound, secure with kerlex. Change daily  Code status: Full.   History of Present Illness:   Zachary Vazquez is an 34 y.o. male with past medical history significant for DM 1 with previous history of DKA who presents with worsening rash.  Patient's disease course started on 7 January when he had a small ulcer on his lower leg of unclear etiology.  He presented to the ED and was treated with antibiotics although it did not appear infected.  He subsequently returned on January 20 with a larger area of rash around the previously small ulcer now noted to have some crusting and vesicles.  He also had some vesicles on the fingers of his right and left hand.  He was worked up for vasculitis but an RPR HIV were negative.  He was treated with Bactrim and prednisone and discharged home.  Patient now presents with worsening of rash now involving his eyelids and perioral area and much worsening of his rash on his leg.  He notes he stopped taking his Bactrim and prednisone 2 days ago.  Patient states the rash on his face started 4 days ago when he noted tingling of his eyes.  He applied Neosporin/triple antibiotic cream to  his eyes as well as to his lip area.  He continue to apply triple antibiotic ointment to his leg as he had been doing for the past 2 weeks.  Yesterday he noted some vesicles and crusting around his eyes but this morning when he woke up he was unable to open his eyes at all due to swelling.  Of note patient does continue to apply the triple antibiotic ointment twice a day.  Patient denies fevers or chills.  No malaise.  No real pain around the rash in his lower extremity.  No joint aches, no back pain, no penile discharge or chancres noted.  No abdominal pain, nausea vomiting or diarrhea.  No hematuria as far as he knows.  No flank pain.  ED Course:  The patient was noted to be tachycardic.  Also noted to have significant periorbital edema with crusting.  Multiple excellent pictures are to be found in the ED note by PA Petrocelli.  ED attending did not feel that patient had Stevens-Johnson syndrome as there was no mucous membrane involvement whatsoever.  Patient was also breathing comfortably with no difficulty.  Patient was admitted for observation to make sure he did not worsen overnight.   Hospital Course by Problem:   RASH I suspect this rash is secondary to an allergy to polymyxin/Neosporin (patient also placed the ointment around his eye)-- seen by ID and they concur -instructed patient not to use ointment I do not suspect patient has ongoing infection given no fevers chills malaise  or leukocytosis and that he is already had 2 courses of oral antibiotics. 2 week steroid taper  HYPERKALEMIA resolved  DM 1 Patient is not in DKA Resume home meds (while on steroid taper may have to adjust )    Medical Consultants:  ID    Discharge Exam:   Vitals:   11/24/19 0500 11/24/19 1127  BP: 128/77 (!) 136/92  Pulse: (!) 103 (!) 102  Resp: 20 19  Temp: 98.1 F (36.7 C) 97.7 F (36.5 C)  SpO2: 97% 99%   Vitals:   11/23/19 2030 11/23/19 2123 11/24/19 0500 11/24/19 1127  BP: (!)  136/91 131/86 128/77 (!) 136/92  Pulse: (!) 103 (!) 108 (!) 103 (!) 102  Resp: 20 20 20 19   Temp:  98 F (36.7 C) 98.1 F (36.7 C) 97.7 F (36.5 C)  TempSrc:  Oral Oral Oral  SpO2: 97% 99% 97% 99%    General exam: Appears calm and comfortable.    The results of significant diagnostics from this hospitalization (including imaging, microbiology, ancillary and laboratory) are listed below for reference.     Procedures and Diagnostic Studies:   DG Tibia/Fibula Left  Result Date: 11/23/2019 CLINICAL DATA:  Status post spider bite with subsequent swollen eyes and rash to face, hands and thigh. EXAM: LEFT TIBIA AND FIBULA - 2 VIEW COMPARISON:  None. FINDINGS: There is no evidence of fracture or other focal bone lesions. Soft tissues are unremarkable. IMPRESSION: Negative. Electronically Signed   By: 11/25/2019 M.D.   On: 11/23/2019 16:15     Labs:   Basic Metabolic Panel: Recent Labs  Lab 11/17/19 1855 11/17/19 1855 11/23/19 1544 11/24/19 0529  NA 140  --  135 133*  K 4.2   < > 5.8* 4.6  CL 105  --  100 101  CO2 27  --  24 20*  GLUCOSE 101*  --  274* 266*  BUN 14  --  16 16  CREATININE 0.96  --  1.18 0.91  CALCIUM 9.4  --  9.3 8.4*   < > = values in this interval not displayed.   GFR Estimated Creatinine Clearance: 124.1 mL/min (by C-G formula based on SCr of 0.91 mg/dL). Liver Function Tests: Recent Labs  Lab 11/17/19 1855 11/23/19 1544  AST 18 12*  ALT 19 14  ALKPHOS 64 59  BILITOT 0.8 1.4*  PROT 7.4 6.8  ALBUMIN 4.5 4.0   No results for input(s): LIPASE, AMYLASE in the last 168 hours. No results for input(s): AMMONIA in the last 168 hours. Coagulation profile No results for input(s): INR, PROTIME in the last 168 hours.  CBC: Recent Labs  Lab 11/17/19 1855 11/23/19 1544 11/24/19 0529  WBC 7.8 6.6 7.9  NEUTROABS  --  4.0  --   HGB 15.9 16.8 14.3  HCT 46.9 49.9 43.2  MCV 92.5 92.9 92.1  PLT 273 257 273   Cardiac Enzymes: No results for  input(s): CKTOTAL, CKMB, CKMBINDEX, TROPONINI in the last 168 hours. BNP: Invalid input(s): POCBNP CBG: Recent Labs  Lab 11/23/19 1501 11/23/19 2140 11/24/19 0603 11/24/19 1052  GLUCAP 266* 213* 220* 221*   D-Dimer No results for input(s): DDIMER in the last 72 hours. Hgb A1c Recent Labs    11/23/19 2250  HGBA1C 6.7*   Lipid Profile No results for input(s): CHOL, HDL, LDLCALC, TRIG, CHOLHDL, LDLDIRECT in the last 72 hours. Thyroid function studies No results for input(s): TSH, T4TOTAL, T3FREE, THYROIDAB in the last 72 hours.  Invalid input(s):  FREET3 Anemia work up No results for input(s): VITAMINB12, FOLATE, FERRITIN, TIBC, IRON, RETICCTPCT in the last 72 hours. Microbiology Recent Results (from the past 240 hour(s))  Culture, blood (single) w Reflex to ID Panel     Status: None   Collection Time: 11/17/19  6:55 PM   Specimen: BLOOD LEFT FOREARM  Result Value Ref Range Status   Specimen Description   Final    BLOOD LEFT FOREARM Performed at [facility] [address], [place], [place] [address]    Special Requests   Final    BOTTLES DRAWN AEROBIC ONLY Blood Culture adequate volume Performed at [facility] [address], [place], [place] [address]    Culture   Final    NO GROWTH 5 DAYS Performed at [facility], [place] [address], [place], [place] [address]    Report Status 11/23/2019 FINAL  Final  SARS CORONAVIRUS 2 (TAT 6-24 HRS) Nasopharyngeal Nasopharyngeal Swab     Status: None   Collection Time: 11/23/19  5:53 PM   Specimen: Nasopharyngeal Swab  Result Value Ref Range Status   SARS Coronavirus 2 NEGATIVE NEGATIVE Final    Comment: (NOTE) SARS-CoV-2 target nucleic acids are NOT DETECTED. The SARS-CoV-2 RNA is generally detectable in upper and lower respiratory specimens during the acute phase of infection. Negative results do not preclude SARS-CoV-2 infection, do not rule out co-infections with other  pathogens, and should not be used as the sole basis for treatment or other patient management decisions. Negative results must be combined with clinical observations, patient history, and epidemiological information. The expected result is Negative. Fact Sheet for Patients: SugarRoll.be Fact Sheet for Healthcare Providers: https://www.woods-mathews.com/ This test is not yet approved or cleared by the [place] FDA and  has been authorized for detection and/or diagnosis of SARS-CoV-2 by FDA under an Emergency Use Authorization (EUA). This EUA will remain  in effect (meaning this test can be used) for the duration of the COVID-19 declaration under Section 56 4(b)(1) of the Act, 21 U.S.C. section 360bbb-3(b)(1), unless the authorization is terminated or revoked sooner. Performed at [facility], [place] [address], [place], [place] [address]      Discharge Instructions:   Discharge Instructions    Discharge instructions   Complete by: As directed    DO NOT USE ABX OINTMENT While on steroids your blood sugars will be higher than normal, adjust insulin as needed Wound care: Place Xeroform gauze (can get at [facility] etc) over the leg wound, secure with kerlex. Change daily.   Increase activity slowly   Complete by: As directed      Allergies as of 11/24/2019      Reactions   Clindamycin/lincomycin Anaphylaxis      Medication List    STOP taking these medications   sulfamethoxazole-trimethoprim 800-160 MG tablet Commonly known as: BACTRIM DS     TAKE these medications   diphenhydrAMINE 25 MG tablet Commonly known as: BENADRYL Take 25 mg by mouth every 6 (six) hours as needed for itching or allergies.   insulin aspart 100 UNIT/ML injection Commonly known as: NovoLOG 5 units daily with breakfast, 7 units with lunch, 7 units with dinner. Hold short-acting insulin if blood sugar < 125. What changed:   how much to  take  how to take this  when to take this  reasons to take this  additional instructions   insulin glargine 100 UNIT/ML injection Commonly known as: LANTUS Inject 0.3 mLs (30 Units  total) into the skin daily. What changed: when to take this   predniSONE 10 MG tablet Commonly known as: DELTASONE 40 mg daily x 4 days, 30 mg x3 days, 20 mg x 3 days, 10 mg x 3 days then 5 mg x 2 days then stop What changed: additional instructions         Time coordinating discharge: 35 min  Signed:  Joseph Art DO  [facility] 11/24/2019, 1:46 PM

## 2019-11-24 NOTE — Consult Note (Signed)
[facility]       Reason for Consult: rash    Referring Physician: Dr. Benjamine Mola  Principal Problem:   Rash and nonspecific skin eruption Active Problems:   Type 1 diabetes mellitus (HCC)   Wound of left leg   . insulin aspart  0-20 Units Subcutaneous TID WC  . insulin aspart  5 Units Subcutaneous TID WC  . insulin glargine  30 Units Subcutaneous QHS  . predniSONE  40 mg Oral Q breakfast    Recommendations: Avoid antibiotic ointment Continue steroids with taper per primary team   Assessment: He has a rash on his leg following previous bite from an unknown previous source that is circular, palpable, with no warmth, no tenderness, most c/w contact dermatitis from antibiotic ointment.  It has no signs of infection and he has not had fever or other systemic signs.  Additionally his rash is also in his eyes and corners of mouth which also would not be c/w infection but areas of contact with antibiotics ointment which he also put on his eyelids.     Bactrim allergy/TEN/SJS less likely with no mucous membrane involvement and rash on leg more c/w dermatitis.     Thanks for consultation.  Call with questions.   Antibiotics: None, received bactrim  HPI: Zachary Vazquez is a 34 y.o. male with a history of type 1 diabetes who came in with a rash.  Intially started on his leg after a blister formed centrally that appeared like an insect bite.  He unroofed it and clear fluid was in it.  He began putting on antibiotic ointment and it continued to spread.  Later involved his eyelids and mouth after putting on ointment.  No fever, no chills.  WBC wnl.     Review of Systems:  Constitutional: negative for fevers, chills, malaise and anorexia Gastrointestinal: negative for nausea and diarrhea Integument/breast: positive for mild pruritis  All other systems reviewed and are negative    Past Medical History:  Diagnosis Date  . Type 1 diabetes mellitus (HCC)      Social History   Tobacco Use  . Smoking status: Current Every Day Smoker    Packs/day: 1.00    Years: 15.00    Pack years: 15.00    Types: Cigarettes  . Smokeless tobacco: Never Used  Substance Use Topics  . Alcohol use: No  . Drug use: Yes    Types: Marijuana    Comment: occ    Family History  Problem Relation Age of Onset  . Hypertension Mother   . Diabetes Mellitus I Brother     Allergies  Allergen Reactions  . Clindamycin/Lincomycin Anaphylaxis    Physical Exam: Constitutional: in no apparent distress  Vitals:   11/24/19 0500 11/24/19 1127  BP: 128/77 (!) 136/92  Pulse: (!) 103 (!) 102  Resp: 20 19  Temp: 98.1 F (36.7 C) 97.7 F (36.5 C)  SpO2: 97% 99%   EYES: anicteric; eyelids with swelling bilateral, eyes with EOMI.  Some eyelid erythema ENMT: + small ulceration at both corners of mouth, no ulcerations inside mouth, tongue Cardiovascular: Cor RRR Respiratory: clear; Musculoskeletal: no pedal edema noted Skin:left leg with well circumscribed, palpable, erythematous lesion with central ulceration.  No warmth, no tenderness.   Neuro: non-focal  Lab Results  Component Value Date   WBC 7.9 11/24/2019   HGB 14.3 11/24/2019   HCT 43.2 11/24/2019   MCV 92.1 11/24/2019   PLT 273 11/24/2019  Lab Results  Component Value Date   CREATININE 0.91 11/24/2019   BUN 16 11/24/2019   NA 133 (L) 11/24/2019   K 4.6 11/24/2019   CL 101 11/24/2019   CO2 20 (L) 11/24/2019    Lab Results  Component Value Date   ALT 14 11/23/2019   AST 12 (L) 11/23/2019   ALKPHOS 59 11/23/2019     Microbiology: Recent Results (from the past 240 hour(s))  Culture, blood (single) w Reflex to ID Panel     Status: None   Collection Time: 11/17/19  6:55 PM   Specimen: BLOOD LEFT FOREARM  Result Value Ref Range Status   Specimen Description   Final    BLOOD LEFT FOREARM Performed at [facility], [place] [address] [place], [place] [address]    Special  Requests   Final    BOTTLES DRAWN AEROBIC ONLY Blood Culture adequate volume Performed at [facility] [address], [place], [place] [address]    Culture   Final    NO GROWTH 5 DAYS Performed at [facility], [place] [address] [place], [place] [address]    Report Status 11/23/2019 FINAL  Final  SARS CORONAVIRUS 2 (TAT 6-24 HRS) Nasopharyngeal Nasopharyngeal Swab     Status: None   Collection Time: 11/23/19  5:53 PM   Specimen: Nasopharyngeal Swab  Result Value Ref Range Status   SARS Coronavirus 2 NEGATIVE NEGATIVE Final    Comment: (NOTE) SARS-CoV-2 target nucleic acids are NOT DETECTED. The SARS-CoV-2 RNA is generally detectable in upper and lower respiratory specimens during the acute phase of infection. Negative results do not preclude SARS-CoV-2 infection, do not rule out co-infections with other pathogens, and should not be used as the sole basis for treatment or other patient management decisions. Negative results must be combined with clinical observations, patient history, and epidemiological information. The expected result is Negative. Fact Sheet for Patients: SugarRoll.be Fact Sheet for Healthcare Providers: https://www.woods-mathews.com/ This test is not yet approved or cleared by the [place] FDA and  has been authorized for detection and/or diagnosis of SARS-CoV-2 by FDA under an Emergency Use Authorization (EUA). This EUA will remain  in effect (meaning this test can be used) for the duration of the COVID-19 declaration under Section 56 4(b)(1) of the Act, 21 U.S.C. section 360bbb-3(b)(1), unless the authorization is terminated or revoked sooner. Performed at [facility], [place] [address], [place], [place] [address]     Zachary Vazquez W Zachary Vazquez, [facility] [email or web address] 11/24/2019, 1:05 PM

## 2019-11-24 NOTE — Progress Notes (Signed)
Briefly spoke with patient regarding steroids and glycemic control.  Reminded him that blood sugars will be higher while he is on steroids.  Encouraged him to monitor blood sugars closely and make sure to take insulin consistently to prevent hyperglycemia.  Instructed him to call PCP if blood sugars consistently high or for hypoglycemia for insulin adjustments.  Congratulated patient on current A1C of 6.7% as well. Patient seems to have a good understanding of DM management at this time.   Thanks  Beryl Meager, RN, BC-ADM Inpatient Diabetes Coordinator Pager [phone] (8a-5p)

## 2019-11-24 NOTE — Progress Notes (Signed)
Inpatient Diabetes Program Recommendations  AACE/ADA: New Consensus Statement on Inpatient Glycemic Control (2015)  Target Ranges:  Prepandial:   less than 140 mg/dL      Peak postprandial:   less than 180 mg/dL (1-2 hours)      Critically ill patients:  140 - 180 mg/dL   Lab Results  Component Value Date   GLUCAP 220 (H) 11/24/2019   HGBA1C 6.7 (H) 11/23/2019    Review of Glycemic Control  Diabetes history: DM 2 Outpatient Diabetes medications:  Lantus 30 units q HS, Novolog 5 units breakfast, 7 units lunch, 7 units dinner (hold if less than 125 mg/dL) Current orders for Inpatient glycemic control:  Lantus 24 units q HS, Novolog resistant tid with meals Prednisone 40 mg daily  Inpatient Diabetes Program Recommendations:    Note history of Type 1 DM. Please add Novolog meal coverage 5 units tid with meals.   Thanks  Beryl Meager, RN, BC-ADM Inpatient Diabetes Coordinator Pager [phone] (8a-5p)

## 2019-11-24 NOTE — Consult Note (Signed)
WOC Nurse Consult Note: Patient receiving care in [facility] 5C16.  I spoke with Dr. Benjamine Mola via telephone and expressed my concern that the "rash" that is spreading is actually untreated Erysipelas.  Dr. Benjamine Mola is going to reach out to Infectious Diseases to get their input. Reason for Consult: wound care to LLE Wound type: currently to be decided. Pressure Injury POA: Yes/No/NA Measurement: Wound bed: scaled, red.  Patient also has periorbital edema (see the many excellent photos) and a rash that also involves other areas of the body. Drainage (amount, consistency, odor)  Periwound: erythematous Dressing procedure/placement/frequency: Place Xeroform gauze [place] [phone]) over the leg wound, secure with kerlex. Change daily. Monitor the wound area(s) for worsening of condition such as: Signs/symptoms of infection,  Increase in size,  Development of or worsening of odor, Development of pain, or increased pain at the affected locations.  Notify the medical team if any of these develop.  Thank you for the consult. WOC nurse will not follow at this time.  Please re-consult the WOC team if needed.  Helmut Muster, RN, MSN, CWOCN, CNS-BC, pager [phone]

## 2019-11-25 MED FILL — predniSONE 10 MG TABS: 10 | 15 days supply | Qty: 35 | Fill #0

## 2019-11-29 ENCOUNTER — Other Ambulatory Visit: Payer: Self-pay

## 2019-11-29 ENCOUNTER — Ambulatory Visit (INDEPENDENT_AMBULATORY_CARE_PROVIDER_SITE_OTHER): Payer: Self-pay | Admitting: Family Medicine

## 2019-11-29 ENCOUNTER — Encounter: Payer: Self-pay | Admitting: Family Medicine

## 2019-11-29 VITALS — BP 124/84 | HR 112 | Temp 98.1°F | Ht 71.0 in | Wt 190.2 lb

## 2019-11-29 DIAGNOSIS — S81802D Unspecified open wound, left lower leg, subsequent encounter: Secondary | ICD-10-CM

## 2019-11-29 DIAGNOSIS — Z09 Encounter for follow-up examination after completed treatment for conditions other than malignant neoplasm: Secondary | ICD-10-CM

## 2019-11-29 DIAGNOSIS — T63301D Toxic effect of unspecified spider venom, accidental (unintentional), subsequent encounter: Secondary | ICD-10-CM

## 2019-11-29 DIAGNOSIS — E109 Type 1 diabetes mellitus without complications: Secondary | ICD-10-CM

## 2019-11-29 LAB — POCT URINALYSIS DIPSTICK
Bilirubin, UA: NEGATIVE
Blood, UA: NEGATIVE
Glucose, UA: POSITIVE — AB
Ketones, UA: NEGATIVE
Leukocytes, UA: NEGATIVE
Nitrite, UA: NEGATIVE
Protein, UA: NEGATIVE
Spec Grav, UA: >=1.03 — AB (ref 1.010–1.025)
Urobilinogen, UA: 0.2 E.U./dL
pH, UA: 6 (ref 5.0–8.0)

## 2019-11-29 LAB — CULTURE, BLOOD (ROUTINE X 2)
Culture: NO GROWTH
Special Requests: ADEQUATE

## 2019-11-29 LAB — GLUCOSE, POCT (MANUAL RESULT ENTRY): POC Glucose: 153 mg/dl — AB (ref 70–99)

## 2019-11-29 MED ORDER — INSULIN GLARGINE 100 UNIT/ML ~~LOC~~ SOLN: 30.0000 [IU] | Freq: Every day | SUBCUTANEOUS | 12 refills | Status: DC

## 2019-11-29 MED FILL — !LANTUS 100 UNITS/ML VIAL: 100 | 28 days supply | Qty: 10 | Fill #0

## 2019-11-29 NOTE — Progress Notes (Signed)
[facility]    Hospital Follow Up  Subjective:  Patient ID: Zachary Vazquez, male    DOB: [birth year]  Age: 34 y.o. MRN: [id number]  CC:  Chief Complaint  Patient presents with  . Wound Check    left calf wound    HPI Zachary Vazquez is a 34 year old male who presents for Hospital Follow Up today.   Past Medical History:  Diagnosis Date  . Type 1 diabetes mellitus (HCC)    Current Status: Since his last office visit, he has has a Hospital Admission for Leg Rash/ Insect Bite. Today, he is doing well with no complaints.  His left leg wound/rash has improved. He denies fatigue, frequent urination, blurred vision, excessive hunger, excessive thirst, weight gain, weight loss, and poor wound healing. He continues to check his feet regularly. His anxiety is mild today. He denies suicidal ideations, homicidal ideations, or auditory hallucinations. He denies fevers, chills, recent infections, weight loss, and night sweats. He has not had any headaches, dizziness, and falls. No chest pain, heart palpitations, cough and shortness of breath reported. No reports of GI problems such as nausea, vomiting, diarrhea, and constipation. He has no reports of blood in stools, dysuria and hematuria. He denies pain today.    Past Surgical History:  Procedure Laterality Date  . TONSILLECTOMY      Family History  Problem Relation Age of Onset  . Hypertension Mother   . Diabetes Mellitus I Brother     Social History   Socioeconomic History  . Marital status: Single    Spouse name: Not on file  . Number of children: Not on file  . Years of education: Not on file  . Highest education level: Not on file  Occupational History  . Not on file  Tobacco Use  . Smoking status: Current Every Day Smoker    Packs/day: 1.00    Years: 15.00    Pack years: 15.00    Types: Cigarettes  . Smokeless tobacco: Never Used  Substance and Sexual Activity  . Alcohol  use: No  . Drug use: Yes    Types: Marijuana    Comment: occ  . Sexual activity: Yes    Birth control/protection: None  Other Topics Concern  . Not on file  Social History Narrative  . Not on file   Social Determinants of Health   Financial Resource Strain:   . Difficulty of Paying Living Expenses: Not on file  Food Insecurity:   . Worried About Programme researcher, broadcasting/film/video in the Last Year: Not on file  . Ran Out of Food in the Last Year: Not on file  Transportation Needs:   . Lack of Transportation (Medical): Not on file  . Lack of Transportation (Non-Medical): Not on file  Physical Activity:   . Days of Exercise per Week: Not on file  . Minutes of Exercise per Session: Not on file  Stress:   . Feeling of Stress : Not on file  Social Connections:   . Frequency of Communication with Friends and Family: Not on file  . Frequency of Social Gatherings with Friends and Family: Not on file  . Attends Religious Services: Not on file  . Active Member of Clubs or Organizations: Not on file  . Attends Banker Meetings: Not on file  . Marital Status: Not on file  Intimate Partner Violence:   . Fear of Current or Ex-Partner: Not on file  .  Emotionally Abused: Not on file  . Physically Abused: Not on file  . Sexually Abused: Not on file    Outpatient Medications Prior to Visit  Medication Sig Dispense Refill  . diphenhydrAMINE (BENADRYL) 25 MG tablet Take 25 mg by mouth every 6 (six) hours as needed for itching or allergies.    Marland Kitchen insulin aspart (NOVOLOG) 100 UNIT/ML injection 5 units daily with breakfast, 7 units with lunch, 7 units with dinner. Hold short-acting insulin if blood sugar < 125. (Patient taking differently: Inject 5-20 Units into the skin 3 (three) times daily as needed for high blood sugar. ) 10 mL 11  . predniSONE (DELTASONE) 10 MG tablet 40 mg daily x 4 days, 30 mg x3 days, 20 mg x 3 days, 10 mg x 3 days then 5 mg x 2 days then stop 35 tablet 0  . insulin  glargine (LANTUS) 100 UNIT/ML injection Inject 0.3 mLs (30 Units total) into the skin daily. (Patient taking differently: Inject 30 Units into the skin at bedtime. ) 10 mL 11   No facility-administered medications prior to visit.    Allergies  Allergen Reactions  . Clindamycin/Lincomycin Anaphylaxis    ROS Review of Systems  Constitutional: Negative.   HENT: Negative.   Eyes: Negative.   Respiratory: Negative.   Cardiovascular: Negative.   Gastrointestinal: Negative.   Endocrine: Negative.   Genitourinary: Negative.   Musculoskeletal: Negative.   Skin: Positive for wound (left lower leg wound).  Allergic/Immunologic: Negative.   Neurological: Negative.   Hematological: Negative.   Psychiatric/Behavioral: Negative.       Objective:    Physical Exam  Constitutional: He is oriented to person, place, and time. He appears well-developed and well-nourished.  HENT:  Head: Normocephalic and atraumatic.  Eyes: Conjunctivae are normal.  Cardiovascular: Normal rate, regular rhythm, normal heart sounds and intact distal pulses.  Pulmonary/Chest: Effort normal and breath sounds normal.  Abdominal: Soft. Bowel sounds are normal.  Musculoskeletal:        General: Normal range of motion.     Cervical back: Normal range of motion and neck supple.  Neurological: He is alert and oriented to person, place, and time. He has normal reflexes.  Skin: Skin is warm and dry.  Healing rash  Psychiatric: He has a normal mood and affect. His behavior is normal. Judgment and thought content normal.  Nursing note and vitals reviewed.   BP 124/84   Pulse (!) 112   Temp 98.1 F (36.7 C) (Oral)   Ht 5\' 11"  (1.803 m)   Wt 190 lb 3.2 oz (86.3 kg)   SpO2 99%   BMI 26.53 kg/m  Wt Readings from Last 3 Encounters:  11/29/19 190 lb 3.2 oz (86.3 kg)  11/17/19 191 lb (86.6 kg)  11/06/19 190 lb (86.2 kg)     Health Maintenance Due  Topic Date Due  . PNEUMOCOCCAL POLYSACCHARIDE VACCINE AGE 48-64  HIGH RISK  04/04/1989  . OPHTHALMOLOGY EXAM  04/04/1997  . TETANUS/TDAP  04/04/2006  . FOOT EXAM  12/22/2018  . INFLUENZA VACCINE  05/29/2019    There are no preventive care reminders to display for this patient.  Lab Results  Component Value Date   TSH 2.410 12/22/2017   Lab Results  Component Value Date   WBC 7.9 11/24/2019   HGB 14.3 11/24/2019   HCT 43.2 11/24/2019   MCV 92.1 11/24/2019   PLT 273 11/24/2019   Lab Results  Component Value Date   NA 133 (L) 11/24/2019  K 4.6 11/24/2019   CO2 20 (L) 11/24/2019   GLUCOSE 266 (H) 11/24/2019   BUN 16 11/24/2019   CREATININE 0.91 11/24/2019   BILITOT 1.4 (H) 11/23/2019   ALKPHOS 59 11/23/2019   AST 12 (L) 11/23/2019   ALT 14 11/23/2019   PROT 6.8 11/23/2019   ALBUMIN 4.0 11/23/2019   CALCIUM 8.4 (L) 11/24/2019   ANIONGAP 12 11/24/2019   No results found for: CHOL No results found for: HDL No results found for: LDLCALC No results found for: TRIG No results found for: CHOLHDL Lab Results  Component Value Date   HGBA1C 6.7 (H) 11/23/2019      Assessment & Plan:    1. Hospital discharge follow-up  2. Wound of left lower extremity, subsequent encounter   Healing well.  - POCT urinalysis dipstick - POCT glucose (manual entry)  3. Spider bite wound, accidental or unintentional, subsequent encounter  4. Type 1 diabetes mellitus without complication (HCC) Stable.  - POCT urinalysis dipstick - POCT glucose (manual entry) - insulin glargine (LANTUS) 100 UNIT/ML injection; Inject 0.3 mLs (30 Units total) into the skin at bedtime.  Dispense: 10 mL; Refill: 12  5. Follow up He will follow up in 03/2020.  Meds ordered this encounter  Medications  . insulin glargine (LANTUS) 100 UNIT/ML injection    Sig: Inject 0.3 mLs (30 Units total) into the skin at bedtime.    Dispense:  10 mL    Refill:  12    Orders Placed This Encounter  Procedures  . POCT urinalysis dipstick  . POCT glucose (manual entry)     Referral Orders  No referral(s) requested today    Kathe Becton,  MSN, FNP-BC [address], [place] [address] [phone] fax   Problem List Items Addressed This Visit      Endocrine   Type 1 diabetes mellitus ([place])   Relevant Medications   insulin glargine (LANTUS) 100 UNIT/ML injection   Other Relevant Orders   POCT urinalysis dipstick (Completed)   POCT glucose (manual entry) (Completed)     Other   Spider bite   Wound of left leg   Relevant Orders   POCT urinalysis dipstick (Completed)   POCT glucose (manual entry) (Completed)    Other Visit Diagnoses    Hospital discharge follow-up    -  Primary   Follow up          Meds ordered this encounter  Medications  . insulin glargine (LANTUS) 100 UNIT/ML injection    Sig: Inject 0.3 mLs (30 Units total) into the skin at bedtime.    Dispense:  10 mL    Refill:  12    Follow-up: No follow-ups on file.    Azzie Glatter, FNP

## 2019-12-02 ENCOUNTER — Encounter (HOSPITAL_BASED_OUTPATIENT_CLINIC_OR_DEPARTMENT_OTHER): Payer: Self-pay | Admitting: Internal Medicine

## 2019-12-03 ENCOUNTER — Encounter (HOSPITAL_BASED_OUTPATIENT_CLINIC_OR_DEPARTMENT_OTHER): Payer: Self-pay | Admitting: Internal Medicine

## 2019-12-20 ENCOUNTER — Other Ambulatory Visit: Payer: Self-pay | Admitting: Family Medicine

## 2019-12-20 DIAGNOSIS — E109 Type 1 diabetes mellitus without complications: Secondary | ICD-10-CM

## 2019-12-20 MED FILL — !LANTUS 100 UNITS/ML VIAL: 100 | 28 days supply | Qty: 10 | Fill #1

## 2019-12-24 MED FILL — $novoLOG 100 UNITS/ML VIAL: 100 | 28 days supply | Qty: 10 | Fill #0

## 2019-12-28 MED FILL — !LANTUS 100 UNITS/ML VIAL: 100 | 28 days supply | Qty: 10 | Fill #1

## 2019-12-30 ENCOUNTER — Telehealth: Payer: Self-pay | Admitting: Family Medicine

## 2019-12-30 NOTE — Telephone Encounter (Signed)
Pt has concerns about healing of spider bite. Please call pt back.

## 2019-12-31 ENCOUNTER — Telehealth: Payer: Self-pay

## 2019-12-31 ENCOUNTER — Encounter: Payer: Self-pay | Admitting: Nurse Practitioner

## 2019-12-31 ENCOUNTER — Ambulatory Visit (INDEPENDENT_AMBULATORY_CARE_PROVIDER_SITE_OTHER): Payer: Self-pay | Admitting: Nurse Practitioner

## 2019-12-31 ENCOUNTER — Other Ambulatory Visit: Payer: Self-pay

## 2019-12-31 VITALS — BP 130/81 | HR 109 | Temp 98.1°F | Resp 16 | Ht 71.0 in | Wt 196.0 lb

## 2019-12-31 DIAGNOSIS — R238 Other skin changes: Secondary | ICD-10-CM

## 2019-12-31 DIAGNOSIS — S81802D Unspecified open wound, left lower leg, subsequent encounter: Secondary | ICD-10-CM

## 2019-12-31 LAB — CBC WITH DIFFERENTIAL/PLATELET
Basophils Absolute: 0 10*3/uL (ref 0.0–0.2)
Basos: 1 %
EOS (ABSOLUTE): 0.2 10*3/uL (ref 0.0–0.4)
Eos: 3 %
Hematocrit: 49.6 % (ref 37.5–51.0)
Hemoglobin: 16.5 g/dL (ref 13.0–17.7)
Lymphocytes Absolute: 1.8 10*3/uL (ref 0.7–3.1)
Lymphs: 27 %
MCH: 32 pg (ref 26.6–33.0)
MCHC: 33.3 g/dL (ref 31.5–35.7)
MCV: 96 fL (ref 79–97)
Monocytes Absolute: 0.5 10*3/uL (ref 0.1–0.9)
Monocytes: 8 %
Neutrophils Absolute: 4.1 10*3/uL (ref 1.4–7.0)
Neutrophils: 61 %
Platelets: 228 10*3/uL (ref 150–450)
RBC: 5.15 x10E6/uL (ref 4.14–5.80)
RDW: 13.8 % (ref 11.6–15.4)
WBC: 6.6 10*3/uL (ref 3.4–10.8)

## 2019-12-31 MED ORDER — SILVER SULFADIAZINE 1 % EX CREA: 1.0000 "application " | TOPICAL_CREAM | Freq: Two times a day (BID) | CUTANEOUS | 1 refills | Status: AC

## 2019-12-31 MED FILL — SSD 1% CREAM: 1 | 30 days supply | Qty: 75 | Fill #0

## 2019-12-31 NOTE — Patient Instructions (Signed)
Venous Ulcer A venous ulcer is a shallow sore on your lower leg. Venous ulcer is the most common type of lower leg ulcer. You may have venous ulcers on one leg or on both legs. This condition most often develops around your ankles. This type of ulcer may last for a long time (chronic ulcer) or it may return often (recurrent ulcer). What are the causes? This condition is caused by poor blood flow in your legs. The poor flow causes blood to pool in your legs. This can break the skin, causing an ulcer. What increases the risk? You are more likely to develop this condition if:  You are 33 years of age or older.  You are male.  You are overweight.  You are not active.  You have had a leg ulcer in the past.  You have varicose veins.  You have clots in your lower leg veins (deep vein thrombosis).  You have inflammation of your leg veins (phlebitis).  You have recently been pregnant.  You smoke. What are the signs or symptoms? The main symptom of this condition is an open sore near your ankle. Other symptoms may include:  Swelling.  Thick skin.  Fluid coming from the ulcer.  Bleeding.  Itching.  Pain and swelling. This gets worse when you stand up and feels better when you raise your leg.  Blotchy skin.  Dark skin. How is this treated? This condition may be treated by:  Keeping your leg raised (elevated).  Wearing a type of bandage or stocking to keep pressure (compression) on the veins of your leg.  Taking medicines, including antibiotic medicines.  Cleaning your ulcer and removing any dead tissue from the wound.  Using bandages and wraps that have medicines in them to cover your ulcer.  Closing the wound using a piece of skin taken from another area of your body (graft). Follow these instructions at home: Medicines  Take or apply over-the-counter and prescription medicines only as told by your doctor.  If you were prescribed an antibiotic medicine, take it  as told by your doctor. Do not stop using the antibiotic even if you start to feel better.  Ask your doctor if you should take aspirin before long trips. Wound care  Follow instructions from your doctor about how to take care of your wound. Make sure you: ? Wash your hands with soap and water before and after you change your bandage (dressing). If you cannot use soap and water, use hand sanitizer. ? Change your bandage as told by your doctor. ? If you had a skin graft, leave stitches (sutures) in place. These may need to stay in place for 2 weeks or longer. ? Ask when you should remove your bandage. If your bandage is dry and sticks to your leg when you try to remove it, moisten or wet the bandage with saline solution or water to make it easier to remove.  Once your bandage is off, check your wound each day for signs of infection. Have a caregiver do this for you if you are not able to do it yourself. Check for: ? More redness, swelling, or pain. ? More fluid or blood. ? Warmth. ? Pus or a bad smell. Activity  Do not sit for a long time without moving. Get up to take short walks every 1-2 hours. This is important. Ask for help if you feel weak or unsteady.  Ask your doctor what level of activity is safe for you.  Rest with   your legs raised during the day. If you can, keep your legs above the level of your heart for 30 minutes, 3-4 times a day, or as told by your doctor.  Do not sit with your legs crossed. General instructions   Wear elastic stockings, compression stockings, or support hose as told by your doctor.  Raise the foot of your bed as told by your doctor.  Do not use any products that contain nicotine or tobacco, such as cigarettes, e-cigarettes, and chewing tobacco. If you need help quitting, ask your doctor.  Keep all follow-up visits as told by your doctor. This is important. Contact a doctor if:  Your ulcer is getting larger or is not healing.  Your pain gets  worse. Get help right away if:  You have more redness, swelling, or pain around your ulcer.  You have more fluid or blood coming from your ulcer.  Your ulcer feels warm to the touch.  You have pus or a bad smell coming from your ulcer.  You have a fever. Summary  A venous ulcer is a shallow sore on your lower leg.  Follow instructions from your doctor about how to take care of your wound.  Check your wound each day for signs of infection.  Take over-the-counter and prescription medicines only as told by your doctor.  Keep all follow-up visits as told by your doctor. This is important. This information is not intended to replace advice given to you by your health care provider. Make sure you discuss any questions you have with your health care provider. Document Revised: 06/11/2018 Document Reviewed: 06/11/2018 Elsevier Patient Education  2020 [employer]  

## 2019-12-31 NOTE — Telephone Encounter (Signed)
Called and spoke with patient, advised that WBC was negative so no active infection showing systemically. Reminded him that it is important to keep follow up with Wound Care that is scheduled for next Wednesday. Advised that when we get wound culture results we will make him aware. Asked that he call us back if symptoms continue or worsen. Patient verbalized understanding. Thanks!

## 2019-12-31 NOTE — Telephone Encounter (Signed)
-----   Message from Barbette Merino, NP sent at 12/31/2019  2:55 PM EST ----- Please make Zachary Vazquez  Aware that his WBC is negative no active infection is showing up systemically. So he needs to follow up with wound care We will see what the culture shows. Tell him to call if the symptoms continue to worsen

## 2019-12-31 NOTE — Progress Notes (Signed)
Acute Office Visit  Subjective:    Patient ID: Zachary Vazquez, male    DOB: [birth year], 34 y.o.   MRN: [id number]  Chief Complaint  Patient presents with  . Wound Check    left lower leg.     HPI Patient is in today for recurrent rash.  He  has a past medical history of Type 1 diabetes mellitus ([place]).  January 2021 he had an area on the back of his left leg.  He admits that it was a wound that he burst.  Admits that the area had healed however one week ago the "bite" the area around was not healing.  He admits that he felt something wet.He denies any odor. He saw red streaks. He denies any fever or chills.  He admits that yesterday xeroforrm guaze .   He continues to have systemic symptoms. He has itch. He has taken the benadryl and he is not sure if this made him have a reaction. He is reaction.  His blood sugars have been "good". He denies any shortness or breath or chest pain.   He hasa blister on his left and pinky finger. He noticed this on yesterday. He denies any seasonal allergies.   Past Medical History:  Diagnosis Date  . Type 1 diabetes mellitus ([place])     Past Surgical History:  Procedure Laterality Date  . TONSILLECTOMY      Family History  Problem Relation Age of Onset  . Hypertension Mother   . Diabetes Mellitus I Brother     Social History   Socioeconomic History  . Marital status: Single    Spouse name: Not on file  . Number of children: Not on file  . Years of education: Not on file  . Highest education level: Not on file  Occupational History  . Not on file  Tobacco Use  . Smoking status: Current Every Day Smoker    Packs/day: 1.00    Years: 15.00    Pack years: 15.00    Types: Cigarettes  . Smokeless tobacco: Never Used  Substance and Sexual Activity  . Alcohol use: No  . Drug use: Yes    Types: Marijuana    Comment: occ  . Sexual activity: Yes    Birth control/protection: None  Other Topics Concern  . Not on file  Social History  Narrative  . Not on file   Social Determinants of Health   Financial Resource Strain:   . Difficulty of Paying Living Expenses: Not on file  Food Insecurity:   . Worried About Charity fundraiser in the Last Year: Not on file  . Ran Out of Food in the Last Year: Not on file  Transportation Needs:   . Lack of Transportation (Medical): Not on file  . Lack of Transportation (Non-Medical): Not on file  Physical Activity:   . Days of Exercise per Week: Not on file  . Minutes of Exercise per Session: Not on file  Stress:   . Feeling of Stress : Not on file  Social Connections:   . Frequency of Communication with Friends and Family: Not on file  . Frequency of Social Gatherings with Friends and Family: Not on file  . Attends Religious Services: Not on file  . Active Member of Clubs or Organizations: Not on file  . Attends Archivist Meetings: Not on file  . Marital Status: Not on file  Intimate Partner Violence:   . Fear of Current or Ex-Partner: Not  on file  . Emotionally Abused: Not on file  . Physically Abused: Not on file  . Sexually Abused: Not on file    Outpatient Medications Prior to Visit  Medication Sig Dispense Refill  . insulin aspart (NOVOLOG) 100 UNIT/ML injection INJECT 5 UNITS INTO THE SKIN WITH BREAKFAST, 7 UNITS WITH LUNCH, & 7 UNITS WITH DINNER. HOLD SHORT-ACTING INSULIN IF BLOOD SUGAR <125 20 mL 11  . insulin glargine (LANTUS) 100 UNIT/ML injection Inject 0.3 mLs (30 Units total) into the skin at bedtime. 10 mL 12  . diphenhydrAMINE (BENADRYL) 25 MG tablet Take 25 mg by mouth every 6 (six) hours as needed for itching or allergies.    . predniSONE (DELTASONE) 10 MG tablet 40 mg daily x 4 days, 30 mg x3 days, 20 mg x 3 days, 10 mg x 3 days then 5 mg x 2 days then stop 35 tablet 0   No facility-administered medications prior to visit.    Allergies  Allergen Reactions  . Clindamycin/Lincomycin Anaphylaxis  . Bactrim [Sulfamethoxazole-Trimethoprim]  Swelling    Eye swelling and bumps on eyes     Review of Systems  All other systems reviewed and are negative.      Objective:    Physical Exam Skin:         BP 130/81 (BP Location: Left Arm, Patient Position: Sitting, Cuff Size: Large)   Pulse (!) 109   Temp 98.1 F (36.7 C) (Oral)   Resp 16   Ht 5\' 11"  (1.803 m)   Wt 196 lb (88.9 kg)   SpO2 100%   BMI 27.34 kg/m  Wt Readings from Last 3 Encounters:  12/31/19 196 lb (88.9 kg)  11/29/19 190 lb 3.2 oz (86.3 kg)  11/17/19 191 lb (86.6 kg)    Health Maintenance Due  Topic Date Due  . PNEUMOCOCCAL POLYSACCHARIDE VACCINE AGE 48-64 HIGH RISK  04/04/1989  . OPHTHALMOLOGY EXAM  04/04/1997  . TETANUS/TDAP  04/04/2006  . FOOT EXAM  12/22/2018  . INFLUENZA VACCINE  05/29/2019    There are no preventive care reminders to display for this patient.   Lab Results  Component Value Date   TSH 2.410 12/22/2017   Lab Results  Component Value Date   WBC 6.6 12/31/2019   HGB 16.5 12/31/2019   HCT 49.6 12/31/2019   MCV 96 12/31/2019   PLT 228 12/31/2019   Lab Results  Component Value Date   NA 133 (L) 11/24/2019   K 4.6 11/24/2019   CO2 20 (L) 11/24/2019   GLUCOSE 266 (H) 11/24/2019   BUN 16 11/24/2019   CREATININE 0.91 11/24/2019   BILITOT 1.4 (H) 11/23/2019   ALKPHOS 59 11/23/2019   AST 12 (L) 11/23/2019   ALT 14 11/23/2019   PROT 6.8 11/23/2019   ALBUMIN 4.0 11/23/2019   CALCIUM 8.4 (L) 11/24/2019   ANIONGAP 12 11/24/2019   No results found for: CHOL No results found for: HDL No results found for: LDLCALC No results found for: TRIG No results found for: CHOLHDL Lab Results  Component Value Date   HGBA1C 6.7 (H) 11/23/2019       Assessment & Plan:   Problem List Items Addressed This Visit      Unprioritized   Wound of left leg - Primary   Relevant Orders   CBC with Differential/Platelet (Completed)   WOUND CULTURE    Other Visit Diagnoses    Vesicle of skin       left hand 5th digit  Meds ordered this encounter  Medications  . silver sulfADIAZINE (SILVADENE) 1 % cream    Sig: Apply 1 application topically 2 (two) times daily. The cream should be applied once to twice daily to a thickness of approximately 1/16 inch.    Dispense:  85 g    Refill:  1    Order Specific Question:   Supervising Provider    Answer:   Quentin Angst [[id number]]     Barbette Merino, NP

## 2020-01-03 LAB — WOUND CULTURE: Organism ID, Bacteria: NONE SEEN

## 2020-01-05 ENCOUNTER — Encounter (HOSPITAL_BASED_OUTPATIENT_CLINIC_OR_DEPARTMENT_OTHER): Payer: Self-pay | Attending: Physician Assistant | Admitting: Physician Assistant

## 2020-01-05 ENCOUNTER — Other Ambulatory Visit: Payer: Self-pay

## 2020-01-05 DIAGNOSIS — Z881 Allergy status to other antibiotic agents status: Secondary | ICD-10-CM | POA: Insufficient documentation

## 2020-01-05 DIAGNOSIS — Z833 Family history of diabetes mellitus: Secondary | ICD-10-CM | POA: Insufficient documentation

## 2020-01-05 DIAGNOSIS — I252 Old myocardial infarction: Secondary | ICD-10-CM | POA: Insufficient documentation

## 2020-01-05 DIAGNOSIS — Z883 Allergy status to other anti-infective agents status: Secondary | ICD-10-CM | POA: Insufficient documentation

## 2020-01-05 DIAGNOSIS — I10 Essential (primary) hypertension: Secondary | ICD-10-CM | POA: Insufficient documentation

## 2020-01-05 DIAGNOSIS — L97822 Non-pressure chronic ulcer of other part of left lower leg with fat layer exposed: Secondary | ICD-10-CM | POA: Insufficient documentation

## 2020-01-05 DIAGNOSIS — E10622 Type 1 diabetes mellitus with other skin ulcer: Secondary | ICD-10-CM | POA: Insufficient documentation

## 2020-01-05 DIAGNOSIS — Z8249 Family history of ischemic heart disease and other diseases of the circulatory system: Secondary | ICD-10-CM | POA: Insufficient documentation

## 2020-01-05 MED FILL — TRIAMCINOLONE 0.1% OINTMENT: 0.1 | 30 days supply | Qty: 454 | Fill #0

## 2020-01-05 MED FILL — KETOCONAZOLE 2% CREAM: 2 | 10 days supply | Qty: 30 | Fill #0

## 2020-01-05 MED FILL — FLUCONAZOLE 150 MG TABLET: 150 | 7 days supply | Qty: 2 | Fill #0

## 2020-01-05 NOTE — Progress Notes (Signed)
Zachary Vazquez, Zachary Vazquez ([id number]) Visit Report for 01/05/2020 Chief Complaint Document Details Patient Name: Date of Service: Zachary Vazquez, Zachary Vazquez 01/05/2020 2:45 PM Medical Record [id number] Patient Account Number: [id number] Date of Birth/Sex: Treating RN: 1986/06/14 (34 y.o. Damaris Schooner Primary Care Provider: Raliegh Ip Other Clinician: Referring Provider: Treating Provider/Extender:Stone III, Paulla Fore, Karlyn Agee in Treatment: 0 Information Obtained from: Patient Chief Complaint Left LE Ulcer Electronic Signature(s) Signed: 01/05/2020 3:49:42 PM By: Lenda Kelp PA-C Entered By: Lenda Kelp on 01/05/2020 15:49:41 -------------------------------------------------------------------------------- Debridement Details Patient Name: Date of Service: Zachary Vazquez, Zachary Vazquez 01/05/2020 2:45 PM Medical Record [id number] Patient Account Number: [id number] Date of Birth/Sex: Treating RN: 02-09-1986 (33 y.o. Damaris Schooner Primary Care Provider: Raliegh Ip Other Clinician: Referring Provider: Treating Provider/Extender:Stone III, Paulla Fore, Karlyn Agee in Treatment: 0 Debridement Performed for Wound #1 Left,Posterior Lower Leg Assessment: Performed By: Physician Lenda Kelp, PA Debridement Type: Debridement Severity of Tissue Pre Fat layer exposed Debridement: Level of Consciousness (Pre- Awake and Alert procedure): Pre-procedure Verification/Time Out Taken: Yes - 16:00 Start Time: 16:04 Pain Control: Other : benzocaine 20% Total Area Debrided (L x W): 0.7 (cm) x 0.7 (cm) = 0.49 (cm) Tissue and other material Viable, Non-Viable, Slough, Subcutaneous, Slough debrided: Level: Skin/Subcutaneous Tissue Debridement Description: Excisional Instrument: Curette Bleeding: Minimum Hemostasis Achieved: Pressure End Time: 16:10 Procedural Pain: 4 Post Procedural Pain: 2 Response to Treatment: Procedure was tolerated well Level of Consciousness Awake  and Alert (Post-procedure): Post Debridement Measurements of Total Wound Length: (cm) 0.7 Width: (cm) 0.7 Depth: (cm) 0.1 Volume: (cm) 0.038 Character of Wound/Ulcer Post Improved Debridement: Severity of Tissue Post Debridement: Fat layer exposed Post Procedure Diagnosis Same as Pre-procedure Electronic Signature(s) Signed: 01/05/2020 5:29:37 PM By: Lenda Kelp PA-C Signed: 01/05/2020 5:43:44 PM By: Zenaida Deed RN, BSN Entered By: Zenaida Deed on 01/05/2020 16:06:32 -------------------------------------------------------------------------------- HPI Details Patient Name: Date of Service: Zachary Vazquez, Zachary Vazquez 01/05/2020 2:45 PM Medical Record [id number] Patient Account Number: [id number] Date of Birth/Sex: Treating RN: [birth year] (33 y.o. Damaris Schooner Primary Care Provider: Raliegh Ip Other Clinician: Referring Provider: Treating Provider/Extender:Stone III, Paulla Fore, Karlyn Agee in Treatment: 0 History of Present Illness HPI Description: 01/05/2020 upon evaluation today patient actually appears to be doing poorly with regard to his right lower extremity secondary to a wound that he had that occurred around the first week of January. The patient tells me that he really does not know exactly what happened that precipitated the blister but he first had a blister that showed up and then the blister he popped to try to make it go away. Unfortunately this then began to worsen and breakdown even more. The long story short is that over the time from the first week of January until now he has been back and forth as Dr. Jenelle Mages to help take care of this. They have placed him on prednisone, antibiotics, they have done a wound culture, and most recently were using Silvadene. With that being said he is also have a wound culture which was performed on 12/31/2019 and showed no signs of infection. The patient also did have a white blood cell count of 6.6 that appears to be  normal and also was okay. Overall there is nothing really that showed up to indicate what exactly is going on here. Is also at one point in time use triple antibiotic ointment. Most recently he was referred to the wound center at his visit as well 11/24/2019. With that being said he unfortunately canceled that initial appointment as  he felt like things were getting better only to have them worsen again. In general the area of rash around his leg seems to be itching quite significantly. He has type 1 diabetes but otherwise no other major medical problems. Electronic Signature(s) Signed: 01/05/2020 4:29:24 PM By: Lenda Kelp PA-C Entered By: Lenda Kelp on 01/05/2020 16:10:96 -------------------------------------------------------------------------------- Physical Exam Details Patient Name: Date of Service: Zachary Vazquez, Zachary Vazquez 01/05/2020 2:45 PM Medical Record [id number] Patient Account Number: [id number] Date of Birth/Sex: Treating RN: [birth year] (33 y.o. Damaris Schooner Primary Care Provider: Raliegh Ip Other Clinician: Referring Provider: Treating Provider/Extender:Stone III, Paulla Fore, Karlyn Agee in Treatment: 0 Constitutional sitting or standing blood pressure is within target range for patient.. pulse regular and within target range for patient.Marland Kitchen respirations regular, non-labored and within target range for patient.Marland Kitchen temperature within target range for patient.. Well-nourished and well-hydrated in no acute distress. Eyes conjunctiva clear no eyelid edema noted. pupils equal round and reactive to light and accommodation. Ears, Nose, Mouth, and Throat no gross abnormality of ear auricles or external auditory canals. normal hearing noted during conversation. mucus membranes moist. Respiratory normal breathing without difficulty. Cardiovascular regular rate and rhythm with normal S1, S2. 2+ dorsalis pedis/posterior tibialis pulses. no clubbing,  cyanosis, significant edema, <3 sec cap refill. Musculoskeletal normal gait and posture. no significant deformity or arthritic changes, no loss or range of motion, no clubbing. Psychiatric this patient is able to make decisions and demonstrates good insight into disease process. Alert and Oriented x 3. pleasant and cooperative. Notes Upon inspection patient's wound actually did have some necrotic tissue in the central portion of the wound which did require sharp debridement today. He actually tolerated the sharp debridement without complication and I was able to remove the necrotic debris fortunately this wound did not go nearly as deep as I was afraid of in fact it was only about 1-2 mm below the surface which is excellent. I did get down to fairly good tissue although he does have some granulation obviously this could have to ensue he is young and healthy with good arterial flow I think he will do quite well to be honest. With regard to the rash around his wound I do believe this likely represents a fungal infection he has been using Silvadene but even so it seems to be spreading this is much more likely I think fungal and bacterial the culture for bacteria was actually negative. I am going to go ahead and place him on oral Diflucan as well as topical steroids and antifungal cream. Electronic Signature(s) Signed: 01/05/2020 4:32:06 PM By: Lenda Kelp PA-C Entered By: Lenda Kelp on 01/05/2020 16:32:04 -------------------------------------------------------------------------------- Physician Orders Details Patient Name: Date of Service: Zachary Vazquez, Zachary Vazquez 01/05/2020 2:45 PM Medical Record [id number] Patient Account Number: [id number] Date of Birth/Sex: Treating RN: 07/30/1986 (33 y.o. Damaris Schooner Primary Care Provider: Raliegh Ip Other Clinician: Referring Provider: Treating Provider/Extender:Stone III, Paulla Fore, Karlyn Agee in Treatment: 0 Verbal / Phone  Orders: No Diagnosis Coding ICD-10 Coding Code Description E11.622 Type 2 diabetes mellitus with other skin ulcer S81.802A Unspecified open wound, left lower leg, initial encounter L97.822 Non-pressure chronic ulcer of other part of left lower leg with fat layer exposed Follow-up Appointments Return Appointment in 1 week. Dressing Change Frequency Wound #1 Left,Posterior Lower Leg Change Dressing every other day. Skin Barriers/Peri-Wound Care Antifungal cream - 50:50 mixed with triamcinolone cream to periwound rash TCA Cream or Ointment Wound Cleansing Wound #1 Left,Posterior Lower Leg May shower and  wash wound with soap and water. Primary Wound Dressing Wound #1 Left,Posterior Lower Leg Silver Collagen - moisten with saline Secondary Dressing Wound #1 Left,Posterior Lower Leg Kerlix/Rolled Gauze Dry Gauze Patient Medications Allergies: clindamycin, Bactrim Notifications Medication Indication Start End triamcinolone acetonide 01/05/2020 DOSE topical 0.1 % ointment - ointment topical applied daily as directed in the clinic for your rash ketoconazole 01/05/2020 DOSE topical 2 % cream - cream topical applied daily as directed in the clinic to your rash Diflucan 01/05/2020 DOSE 1 - oral 150 mg tablet - 1 tablet oral taken today for one dose then repeat in 1 week Electronic Signature(s) Signed: 01/05/2020 4:19:53 PM By: Lenda KelpStone III, Kasim Mccorkle PA-C Entered By: Lenda KelpStone III, Asyah Candler on 01/05/2020 16:19:51 -------------------------------------------------------------------------------- Problem List Details Patient Name: Date of Service: Zachary Vazquez, Zachary Vazquez 01/05/2020 2:45 PM Medical Record [id number] Patient Account Number: [id number] Date of Birth/Sex: Treating RN: 10/04/1986 (33 y.o. Damaris SchoonerM) Boehlein, Linda Primary Care Provider: Raliegh IpStroud, Natalie Other Clinician: Referring Provider: Treating Provider/Extender:Stone III, Paulla ForeHoyt Stroud, Karlyn AgeeNatalie Weeks in Treatment: 0 Active  Problems ICD-10 Evaluated Encounter Code Description Active Date Today Diagnosis E10.622 Type 1 diabetes mellitus with other skin ulcer 01/05/2020 No Yes S81.802A Unspecified open wound, left lower leg, initial 01/05/2020 No Yes encounter L97.822 Non-pressure chronic ulcer of other part of left lower 01/05/2020 No Yes leg with fat layer exposed Inactive Problems Resolved Problems Electronic Signature(s) Signed: 01/05/2020 4:31:01 PM By: Lenda KelpStone III, Avanna Sowder PA-C Previous Signature: 01/05/2020 3:48:58 PM Version By: Lenda KelpStone III, Thersea Manfredonia PA-C Entered By: Lenda KelpStone III, Davanta Meuser on 01/05/2020 16:31:01 -------------------------------------------------------------------------------- Progress Note Details Patient Name: Date of Service: Zachary Vazquez, Zachary Vazquez 01/05/2020 2:45 PM Medical Record [id number] Patient Account Number: [id number] Date of Birth/Sex: Treating RN: 11/09/1985 (33 y.o. Damaris SchoonerM) Boehlein, Linda Primary Care Provider: Raliegh IpStroud, Natalie Other Clinician: Referring Provider: Treating Provider/Extender:Stone III, Paulla ForeHoyt Stroud, Karlyn AgeeNatalie Weeks in Treatment: 0 Subjective Chief Complaint Information obtained from Patient Left LE Ulcer History of Present Illness (HPI) 01/05/2020 upon evaluation today patient actually appears to be doing poorly with regard to his right lower extremity secondary to a wound that he had that occurred around the first week of January. The patient tells me that he really does not know exactly what happened that precipitated the blister but he first had a blister that showed up and then the blister he popped to try to make it go away. Unfortunately this then began to worsen and breakdown even more. The long story short is that over the time from the first week of January until now he has been back and forth as Dr. Jenelle Magesrying to help take care of this. They have placed him on prednisone, antibiotics, they have done a wound culture, and most recently were using Silvadene. With that being  said he is also have a wound culture which was performed on 12/31/2019 and showed no signs of infection. The patient also did have a white blood cell count of 6.6 that appears to be normal and also was okay. Overall there is nothing really that showed up to indicate what exactly is going on here. Is also at one point in time use triple antibiotic ointment. Most recently he was referred to the wound center at his visit as well 11/24/2019. With that being said he unfortunately canceled that initial appointment as he felt like things were getting better only to have them worsen again. In general the area of rash around his leg seems to be itching quite significantly. He has type 1 diabetes but otherwise no other major medical problems. Patient  History Information obtained from Patient. Allergies clindamycin (Reaction: anaphylaxis), Bactrim (Reaction: swelling) Family History Diabetes - Siblings, Hypertension - Mother, No family history of Cancer, Heart Disease, Hereditary Spherocytosis, Kidney Disease, Lung Disease, Seizures, Stroke, Thyroid Problems, Tuberculosis. Social History Current every day smoker - 1/2 ppd, Marital Status - Single, Alcohol Use - Rarely, Drug Use - No History, Caffeine Use - Daily - coffee. Medical History Eyes Denies history of Cataracts, Glaucoma, Optic Neuritis Ear/Nose/Mouth/Throat Patient has history of Chronic sinus problems/congestion, Middle ear problems Hematologic/Lymphatic Denies history of Anemia, Hemophilia, Human Immunodeficiency Virus, Lymphedema, Sickle Cell Disease Respiratory Denies history of Aspiration, Asthma, Chronic Obstructive Pulmonary Disease (COPD), Pneumothorax, Sleep Apnea, Tuberculosis Cardiovascular Denies history of Angina, Arrhythmia, Congestive Heart Failure, Coronary Artery Disease, Deep Vein Thrombosis, Hypertension, Hypotension, Myocardial Infarction, Peripheral Arterial Disease, Peripheral Venous Disease, Phlebitis,  Vasculitis Gastrointestinal Denies history of Cirrhosis , Colitis, Crohnoos, Hepatitis A, Hepatitis B, Hepatitis C Endocrine Patient has history of Type I Diabetes Genitourinary Denies history of End Stage Renal Disease Immunological Denies history of Lupus Erythematosus, Raynaudoos, Scleroderma Integumentary (Skin) Patient has history of History of Burn Musculoskeletal Denies history of Gout, Rheumatoid Arthritis, Osteoarthritis, Osteomyelitis Neurologic Denies history of Dementia, Neuropathy, Quadriplegia, Paraplegia, Seizure Disorder Oncologic Denies history of Received Chemotherapy, Received Radiation Psychiatric Denies history of Anorexia/bulimia, Confinement Anxiety Patient is treated with Insulin. Blood sugar is tested. Hospitalization/Surgery History - tonsillectomy. Review of Systems (ROS) Constitutional Symptoms (General Health) Denies complaints or symptoms of Fatigue, Fever, Chills, Marked Weight Change. Eyes Denies complaints or symptoms of Dry Eyes, Vision Changes, Glasses / Contacts. Ear/Nose/Mouth/Throat Complains or has symptoms of Chronic sinus problems or rhinitis. Respiratory Denies complaints or symptoms of Chronic or frequent coughs, Shortness of Breath. Cardiovascular Denies complaints or symptoms of Chest pain. Gastrointestinal Denies complaints or symptoms of Frequent diarrhea, Nausea, Vomiting. Endocrine Denies complaints or symptoms of Heat/cold intolerance. Genitourinary Denies complaints or symptoms of Frequent urination. Integumentary (Skin) Denies complaints or symptoms of Wounds. Musculoskeletal Denies complaints or symptoms of Muscle Pain, Muscle Weakness. Neurologic Denies complaints or symptoms of Numbness/parasthesias. Psychiatric Denies complaints or symptoms of Claustrophobia, Suicidal. Objective Constitutional sitting or standing blood pressure is within target range for patient.. pulse regular and within target range for  patient.Marland Kitchen respirations regular, non-labored and within target range for patient.Marland Kitchen temperature within target range for patient.. Well-nourished and well-hydrated in no acute distress. Vitals Time Taken: 3:08 PM, Height: 71 in, Source: Stated, Weight: 180 lbs, Source: Stated, BMI: 25.1, Temperature: 98.4 F, Pulse: 98 bpm, Respiratory Rate: 18 breaths/min, Blood Pressure: 121/79 mmHg. Eyes conjunctiva clear no eyelid edema noted. pupils equal round and reactive to light and accommodation. Ears, Nose, Mouth, and Throat no gross abnormality of ear auricles or external auditory canals. normal hearing noted during conversation. mucus membranes moist. Respiratory normal breathing without difficulty. Cardiovascular regular rate and rhythm with normal S1, S2. 2+ dorsalis pedis/posterior tibialis pulses. no clubbing, cyanosis, significant edema, Musculoskeletal normal gait and posture. no significant deformity or arthritic changes, no loss or range of motion, no clubbing. Psychiatric this patient is able to make decisions and demonstrates good insight into disease process. Alert and Oriented x 3. pleasant and cooperative. General Notes: Upon inspection patient's wound actually did have some necrotic tissue in the central portion of the wound which did require sharp debridement today. He actually tolerated the sharp debridement without complication and I was able to remove the necrotic debris fortunately this wound did not go nearly as deep as I was afraid of in fact it was  only about 1-2 mm below the surface which is excellent. I did get down to fairly good tissue although he does have some granulation obviously this could have to ensue he is young and healthy with good arterial flow I think he will do quite well to be honest. With regard to the rash around his wound I do believe this likely represents a fungal infection he has been using Silvadene but even so it seems to be spreading this is much  more likely I think fungal and bacterial the culture for bacteria was actually negative. I am going to go ahead and place him on oral Diflucan as well as topical steroids and antifungal cream. Integumentary (Hair, Skin) Wound #1 status is Open. Original cause of wound was Bite. The wound is located on the Left,Posterior Lower Leg. The wound measures 0.7cm length x 0.7cm width x 0.1cm depth; 0.385cm^2 area and 0.038cm^3 volume. There is no tunneling or undermining noted. There is a medium amount of serosanguineous drainage noted. The wound margin is distinct with the outline attached to the wound base. There is a large (67-100%) amount of necrotic tissue within the wound bed including Adherent Slough. General Notes: circular rash to periwound. Assessment Active Problems ICD-10 Type 1 diabetes mellitus with other skin ulcer Unspecified open wound, left lower leg, initial encounter Non-pressure chronic ulcer of other part of left lower leg with fat layer exposed Procedures Wound #1 Pre-procedure diagnosis of Wound #1 is a Diabetic Wound/Ulcer of the Lower Extremity located on the Left,Posterior Lower Leg .Severity of Tissue Pre Debridement is: Fat layer exposed. There was a Excisional Skin/Subcutaneous Tissue Debridement with a total area of 0.49 sq cm performed by Lenda Kelp, PA. With the following instrument(s): Curette to remove Viable and Non-Viable tissue/material. Material removed includes Subcutaneous Tissue and Slough and after achieving pain control using Other (benzocaine 20%). No specimens were taken. A time out was conducted at 16:00, prior to the start of the procedure. A Minimum amount of bleeding was controlled with Pressure. The procedure was tolerated well with a pain level of 4 throughout and a pain level of 2 following the procedure. Post Debridement Measurements: 0.7cm length x 0.7cm width x 0.1cm depth; 0.038cm^3 volume. Character of Wound/Ulcer Post Debridement is  improved. Severity of Tissue Post Debridement is: Fat layer exposed. Post procedure Diagnosis Wound #1: Same as Pre-Procedure Plan Follow-up Appointments: Return Appointment in 1 week. Dressing Change Frequency: Wound #1 Left,Posterior Lower Leg: Change Dressing every other day. Skin Barriers/Peri-Wound Care: Antifungal cream - 50:50 mixed with triamcinolone cream to periwound rash TCA Cream or Ointment Wound Cleansing: Wound #1 Left,Posterior Lower Leg: May shower and wash wound with soap and water. Primary Wound Dressing: Wound #1 Left,Posterior Lower Leg: Silver Collagen - moisten with saline Secondary Dressing: Wound #1 Left,Posterior Lower Leg: Kerlix/Rolled Gauze Dry Gauze The following medication(s) was prescribed: triamcinolone acetonide topical 0.1 % ointment ointment topical applied daily as directed in the clinic for your rash starting 01/05/2020 ketoconazole topical 2 % cream cream topical applied daily as directed in the clinic to your rash starting 01/05/2020 Diflucan oral 150 mg tablet 1 1 tablet oral taken today for one dose then repeat in 1 week starting 01/05/2020 1. I would recommend at this point that we use silver collagen to the base of the wound and the patient is in agreement with that plan. 2. I am also can recommend a mixture of triamcinolone ointment and ketoconazole cream to be mixed half-and-half and applied in a  thin film over the rash area of his leg over the next week. These prescriptions were sent into the pharmacy for him. 3. I would recommend that he use an ABD pad and wrap this with roll gauze as I do not want any adhesive on his skin at this point nor will it stick with the creams. We will see patient back for reevaluation in 1 week here in the clinic. If anything worsens or changes patient will contact our office for additional recommendations. Electronic Signature(s) Signed: 01/05/2020 4:32:45 PM By: Worthy Keeler PA-C Entered By: Worthy Keeler on 01/05/2020 16:32:44 -------------------------------------------------------------------------------- HxROS Details Patient Name: Date of Service: Zachary Vazquez, Zachary Vazquez 01/05/2020 2:45 PM Medical Record [id number] Patient Account Number: [id number] Date of Birth/Sex: Treating RN: [birth year] (34 y.o. Hessie Diener Primary Care Provider: Kathe Becton Other Clinician: Referring Provider: Treating Provider/Extender:Stone III, Sherlie Ban, Madolyn Frieze in Treatment: 0 Information Obtained From Patient Constitutional Symptoms (General Health) Complaints and Symptoms: Negative for: Fatigue; Fever; Chills; Marked Weight Change Eyes Complaints and Symptoms: Negative for: Dry Eyes; Vision Changes; Glasses / Contacts Medical History: Negative for: Cataracts; Glaucoma; Optic Neuritis Ear/Nose/Mouth/Throat Complaints and Symptoms: Positive for: Chronic sinus problems or rhinitis Medical History: Positive for: Chronic sinus problems/congestion; Middle ear problems Respiratory Complaints and Symptoms: Negative for: Chronic or frequent coughs; Shortness of Breath Medical History: Negative for: Aspiration; Asthma; Chronic Obstructive Pulmonary Disease (COPD); Pneumothorax; Sleep Apnea; Tuberculosis Cardiovascular Complaints and Symptoms: Negative for: Chest pain Medical History: Negative for: Angina; Arrhythmia; Congestive Heart Failure; Coronary Artery Disease; Deep Vein Thrombosis; Hypertension; Hypotension; Myocardial Infarction; Peripheral Arterial Disease; Peripheral Venous Disease; Phlebitis; Vasculitis Gastrointestinal Complaints and Symptoms: Negative for: Frequent diarrhea; Nausea; Vomiting Medical History: Negative for: Cirrhosis ; Colitis; Crohns; Hepatitis A; Hepatitis B; Hepatitis C Endocrine Complaints and Symptoms: Negative for: Heat/cold intolerance Medical History: Positive for: Type I Diabetes Treated with: Insulin Blood sugar tested every day:  Yes Tested : Genitourinary Complaints and Symptoms: Negative for: Frequent urination Medical History: Negative for: End Stage Renal Disease Integumentary (Skin) Complaints and Symptoms: Negative for: Wounds Medical History: Positive for: History of Burn Musculoskeletal Complaints and Symptoms: Negative for: Muscle Pain; Muscle Weakness Medical History: Negative for: Gout; Rheumatoid Arthritis; Osteoarthritis; Osteomyelitis Neurologic Complaints and Symptoms: Negative for: Numbness/parasthesias Medical History: Negative for: Dementia; Neuropathy; Quadriplegia; Paraplegia; Seizure Disorder Psychiatric Complaints and Symptoms: Negative for: Claustrophobia; Suicidal Medical History: Negative for: Anorexia/bulimia; Confinement Anxiety Hematologic/Lymphatic Medical History: Negative for: Anemia; Hemophilia; Human Immunodeficiency Virus; Lymphedema; Sickle Cell Disease Immunological Medical History: Negative for: Lupus Erythematosus; Raynauds; Scleroderma Oncologic Medical History: Negative for: Received Chemotherapy; Received Radiation HBO Extended History Items Ear/Nose/Mouth/Throat: Ear/Nose/Mouth/Throat: Chronic sinus Middle ear problems problems/congestion Immunizations Pneumococcal Vaccine: Received Pneumococcal Vaccination: No Implantable Devices None Hospitalization / Surgery History Type of Hospitalization/Surgery tonsillectomy Family and Social History Cancer: No; Diabetes: Yes - Siblings; Heart Disease: No; Hereditary Spherocytosis: No; Hypertension: Yes - Mother; Kidney Disease: No; Lung Disease: No; Seizures: No; Stroke: No; Thyroid Problems: No; Tuberculosis: No; Current every day smoker - 1/2 ppd; Marital Status - Single; Alcohol Use: Rarely; Drug Use: No History; Caffeine Use: Daily - coffee; Financial Concerns: No; Food, Clothing or Shelter Needs: No; Support System Lacking: No; Transportation Concerns: No Electronic Signature(s) Signed: 01/05/2020  5:29:37 PM By: Worthy Keeler PA-C Signed: 01/05/2020 5:34:34 PM By: Deon Pilling Entered By: Deon Pilling on 01/05/2020 15:17:56 -------------------------------------------------------------------------------- SuperBill Details Patient Name: Date of Service: Zachary Vazquez, Zachary Vazquez 01/05/2020 Medical Record [id number] Patient Account Number: [id number] Date of Birth/Sex: Treating RN: 1986-07-29 (34 y.o.  Damaris Schooner Primary Care Provider: Raliegh Ip Other Clinician: Referring Provider: Treating Provider/Extender:Stone III, Paulla Fore, Karlyn Agee in Treatment: 0 Diagnosis Coding ICD-10 Codes Code Description E11.622 Type 2 diabetes mellitus with other skin ulcer S81.802A Unspecified open wound, left lower leg, initial encounter L97.822 Non-pressure chronic ulcer of other part of left lower leg with fat layer exposed Facility Procedures CPT4 Code Description: 12197588 99213 - WOUND CARE VISIT-LEV 3 EST PT Modifier: 25 Quantity: 1 CPT4 Code Description: 32549826 11042 - DEB SUBQ TISSUE 20 SQ CM/< ICD-10 Diagnosis Description L97.822 Non-pressure chronic ulcer of other part of left lower leg wi Modifier: th fat layer e Quantity: 1 xposed Physician Procedures CPT4 Code Description: 4158309 99204 - WC PHYS LEVEL 4 - NEW PT ICD-10 Diagnosis Description E11.622 Type 2 diabetes mellitus with other skin ulcer S81.802A Unspecified open wound, left lower leg, initial encounter L97.822 Non-pressure chronic ulcer of  other part of left lower le Modifier: 25 g with fat laye Quantity: 1 r exposed CPT4 Code Description: 4076808 11042 - WC PHYS SUBQ TISS 20 SQ CM ICD-10 Diagnosis Description L97.822 Non-pressure chronic ulcer of other part of left lower leg w Modifier: ith fat layer e Quantity: 1 xposed Electronic Signature(s) Signed: 01/05/2020 4:33:28 PM By: Lenda Kelp PA-C Previous Signature: 01/05/2020 4:33:11 PM Version By: Lenda Kelp PA-C Entered By: Lenda Kelp  on 01/05/2020 16:33:27

## 2020-01-05 NOTE — Progress Notes (Addendum)
[name], [name] ([id number]) Visit Report for 01/05/2020 Allergy List Details Patient Name: Date of Service: Zachary Vazquez, Zachary Vazquez 01/05/2020 2:45 PM Medical Record [id number] Patient Account Number: [id number] Date of Birth/Sex: Treating RN: April 15, 1986 (34 y.o. Male) Shawn Stall Primary Care Larah Kuntzman: Raliegh Ip Other Clinician: Referring Maddax Palinkas: Treating Genoa Freyre/Extender:Stone III, Paulla Fore, Karlyn Agee in Treatment: 0 Allergies Active Allergies clindamycin Reaction: anaphylaxis Bactrim Reaction: swelling Allergy Notes Electronic Signature(s) Signed: 01/05/2020 5:34:34 PM By: Shawn Stall Entered By: Shawn Stall on 01/05/2020 15:05:10 -------------------------------------------------------------------------------- Arrival Information Details Patient Name: Date of Service: Zachary Vazquez, Zachary Vazquez 01/05/2020 2:45 PM Medical Record [id number] Patient Account Number: [id number] Date of Birth/Sex: Treating RN: 1986-06-29 (34 y.o. Male) Shawn Stall Primary Care Sakari Raisanen: Raliegh Ip Other Clinician: Referring Ketan Renz: Treating Viveca Beckstrom/Extender:Stone III, Paulla Fore, Karlyn Agee in Treatment: 0 Visit Information Patient Arrived: Ambulatory Arrival Time: 15:04 Accompanied By: self Transfer Assistance: None Patient Identification Verified: Yes Secondary Verification Process Completed: Yes Patient Requires Transmission-Based No Precautions: Patient Has Alerts: No Electronic Signature(s) Signed: 01/05/2020 5:34:34 PM By: Shawn Stall Entered By: Shawn Stall on 01/05/2020 15:04:26 -------------------------------------------------------------------------------- Clinic Level of Care Assessment Details Patient Name: Date of Service: Zachary Vazquez, Zachary Vazquez 01/05/2020 2:45 PM Medical Record [id number] Patient Account Number: [id number] Date of Birth/Sex: Treating RN: 07-Jan-1986 (34 y.o. Male) Zenaida Deed Primary Care Brynleigh Sequeira: Raliegh Ip Other  Clinician: Referring Shammond Arave: Treating Carling Liberman/Extender:Stone III, Paulla Fore, Karlyn Agee in Treatment: 0 Clinic Level of Care Assessment Items TOOL 1 Quantity Score []  - Use when EandM and Procedure is performed on INITIAL visit 0 ASSESSMENTS - Nursing Assessment / Reassessment X - General Physical Exam (combine w/ comprehensive assessment (listed just below) 1 20 when performed on new pt. evals) X - Comprehensive Assessment (HX, ROS, Risk Assessments, Wounds Hx, etc.) 1 25 ASSESSMENTS - Wound and Skin Assessment / Reassessment X - Dermatologic / Skin Assessment (not related to wound area) 1 10 ASSESSMENTS - Ostomy and/or Continence Assessment and Care []  - Incontinence Assessment and Management 0 []  - Ostomy Care Assessment and Management (repouching, etc.) 0 PROCESS - Coordination of Care X - Simple Patient / Family Education for ongoing care 1 15 []  - Complex (extensive) Patient / Family Education for ongoing care 0 X - Staff obtains , Records, Test Results / Process Orders 1 10 []  - Staff telephones HHA, Nursing Homes / Clarify orders / etc 0 []  - Routine Transfer to another Facility (non-emergent condition) 0 []  - Routine Hospital Admission (non-emergent condition) 0 X - New Admissions / / Ordering NPWT, Apligraf, etc. 1 15 []  - Emergency Hospital Admission (emergent condition) 0 PROCESS - Special Needs []  - Pediatric / Minor Patient Management 0 []  - Isolation Patient Management 0 []  - Hearing / Language / Visual special needs 0 []  - Assessment of Community assistance (transportation, D/C planning, etc.) 0 []  - Additional assistance / Altered mentation 0 []  - Support Surface(s) Assessment (bed, cushion, seat, etc.) 0 INTERVENTIONS - Miscellaneous []  - External ear exam 0 []  - Patient Transfer (multiple staff / / Similar devices) 0 []  - Simple Staple / Suture removal (25 or less) 0 []  - Complex Staple / Suture removal (26 or  more) 0 []  - Hypo/Hyperglycemic Management (do not check if billed separately) 0 X - Ankle / Brachial Index (ABI) - do not check if billed separately 1 15 Has the patient been seen at the hospital within the last three years: Yes Total Score: 110 Level Of Care: New/Established - Level 3 Electronic Signature(s) Signed: 01/05/2020 5:43:44 PM  By: Zenaida Deed RN, BSN Entered By: Zenaida Deed on 01/05/2020 16:07:21 -------------------------------------------------------------------------------- Encounter Discharge Information Details Patient Name: Date of Service: Zachary Vazquez, Zachary Vazquez 01/05/2020 2:45 PM Medical Record [id number] Patient Account Number: [id number] Date of Birth/Sex: Treating RN: 1986-03-24 (34 y.o. Male) Yevonne Pax Primary Care Jordie Schreur: Raliegh Ip Other Clinician: Referring Jordyan Hardiman: Treating Empress Newmann/Extender:Stone III, Paulla Fore, Karlyn Agee in Treatment: 0 Encounter Discharge Information Items Post Procedure Vitals Discharge Condition: Stable Temperature (F): 98.4 Ambulatory Status: Ambulatory Pulse (bpm): 98 Discharge Destination: Home Respiratory Rate (breaths/min): 18 Transportation: Private Auto Blood Pressure (mmHg): 121/79 Accompanied By: self Schedule Follow-up Appointment: Yes Clinical Summary of Care: Patient Declined Electronic Signature(s) Signed: 01/05/2020 5:35:23 PM By: Yevonne Pax RN Entered By: Yevonne Pax on 01/05/2020 16:29:58 -------------------------------------------------------------------------------- Lower Extremity Assessment Details Patient Name: Date of Service: Zachary Vazquez, Zachary Vazquez 01/05/2020 2:45 PM Medical Record [id number] Patient Account Number: [id number] Date of Birth/Sex: Treating RN: 11/22/1985 (34 y.o. Male) Shawn Stall Primary Care Blong Busk: Raliegh Ip Other Clinician: Referring Brigida Scotti: Treating Tucker Minter/Extender:Stone III, Paulla Fore, Karlyn Agee in Treatment: 0 Edema Assessment Assessed:  [Left: Yes] [Right: No] Edema: [Left: N] [Right: o] Calf Left: Right: Point of Measurement: 30 cm From Medial Instep 34 cm cm Ankle Left: Right: Point of Measurement: 9 cm From Medial Instep 22 cm cm Vascular Assessment Blood Pressure: Brachial: [Left:120] Ankle: [Left:Dorsalis Pedis: 140 1.17] Electronic Signature(s) Signed: 01/05/2020 5:34:34 PM By: Shawn Stall Entered By: Shawn Stall on 01/05/2020 15:25:13 -------------------------------------------------------------------------------- Multi-Disciplinary Care Plan Details Patient Name: Date of Service: Zachary Vazquez, Zachary Vazquez 01/05/2020 2:45 PM Medical Record [id number] Patient Account Number: [id number] Date of Birth/Sex: Treating RN: [birth year] (33 y.o. Male) Zenaida Deed Primary Care Lenix Kidd: Raliegh Ip Other Clinician: Referring Shabrea Weldin: Treating Syanne Looney/Extender:Stone III, Paulla Fore, Karlyn Agee in Treatment: 0 Active Inactive Nutrition Nursing Diagnoses: Impaired glucose control: actual or potential Goals: Patient/caregiver will maintain therapeutic glucose control Date Initiated: 01/05/2020 Target Resolution Date: 02/02/2020 Goal Status: Active Interventions: Assess HgA1c results as ordered upon admission and as needed Provide education on elevated blood sugars and impact on wound healing Treatment Activities: Patient referred to Primary Care Physician for further nutritional evaluation : 01/05/2020 Notes: Soft Tissue Infection Nursing Diagnoses: Impaired tissue integrity Knowledge deficit related to disease process and management Goals: Patient's soft tissue infection will resolve Date Initiated: 01/05/2020 Target Resolution Date: 02/02/2020 Goal Status: Active Interventions: Assess signs and symptoms of infection every visit Provide education on infection Notes: Wound/Skin Impairment Nursing Diagnoses: Impaired tissue integrity Knowledge deficit related to smoking impact on wound  healing Knowledge deficit related to ulceration/compromised skin integrity Goals: Patient will demonstrate a reduced rate of smoking or cessation of smoking Date Initiated: 01/05/2020 Target Resolution Date: 02/02/2020 Goal Status: Active Patient/caregiver will verbalize understanding of skin care regimen Date Initiated: 01/05/2020 Target Resolution Date: 02/02/2020 Goal Status: Active Ulcer/skin breakdown will have a volume reduction of 30% by week 4 Date Initiated: 01/05/2020 Target Resolution Date: 02/02/2020 Goal Status: Active Interventions: Assess patient/caregiver ability to obtain necessary supplies Assess patient/caregiver ability to perform ulcer/skin care regimen upon admission and as needed Assess ulceration(s) every visit Treatment Activities: Skin care regimen initiated : 01/05/2020 Topical wound management initiated : 01/05/2020 Notes: Electronic Signature(s) Signed: 01/05/2020 5:43:44 PM By: Zenaida Deed RN, BSN Entered By: Zenaida Deed on 01/05/2020 15:54:54 -------------------------------------------------------------------------------- Pain Assessment Details Patient Name: Date of Service: Zachary Vazquez, Zachary Vazquez 01/05/2020 2:45 PM Medical Record [id number] Patient Account Number: [id number] Date of Birth/Sex: Treating RN: [birth year] (33 y.o. Male) Shawn Stall Primary Care Maxie Slovacek: Raliegh Ip Other Clinician: Referring Mora Pedraza: Treating Kemyra August/Extender:Stone III,  Sherlie Ban, Lanelle Bal Weeks in Treatment: 0 Active Problems Location of Pain Severity and Description of Pain Patient Has Paino No Site Locations Rate the pain. Current Pain Level: 0 Pain Management and Medication Current Pain Management: Medication: No Cold Application: No Rest: No Massage: No Activity: No T.E.N.S.: No Heat Application: No Leg drop or elevation: No Is the Current Pain Management Adequate: Adequate How does your wound impact your activities of daily livingo Sleep:  No Bathing: No Appetite: No Relationship With Others: No Bladder Continence: No Emotions: No Bowel Continence: No Work: No Toileting: No Drive: No Dressing: No Hobbies: No Electronic Signature(s) Signed: 01/05/2020 5:34:34 PM By: Deon Pilling Entered By: Deon Pilling on 01/05/2020 15:07:04 -------------------------------------------------------------------------------- Patient/Caregiver Education Details Patient Name: Zachary Vazquez 3/10/2021andnbsp2:45 Date of Service: PM Medical Record [id number] Number: Patient Account Number: [id number] Treating RN: 1986-05-19 (34 y.o. Baruch Gouty Date of Birth/Gender: Male) Other Clinician: Primary Care Physician: Kathe Becton Treating Worthy Keeler Referring Physician: Physician/Extender: Conley Canal in Treatment: 0 Education Assessment Education Provided To: Patient Education Topics Provided Infection: Methods: Explain/Verbal Responses: Reinforcements needed, State content correctly Welcome To [facility]: Handouts: [facility] [place] Methods: Explain/Verbal, Printed Responses: Reinforcements needed, State content correctly Wound/Skin Impairment: Handouts: Caring for Your Ulcer, Skin Care Do's and Dont's, Smoking and Wound Healing Methods: Explain/Verbal, Printed Responses: Reinforcements needed, State content correctly Electronic Signature(s) Signed: 01/05/2020 5:43:44 PM By: Baruch Gouty RN, BSN Entered By: Baruch Gouty on 01/05/2020 16:00:03 -------------------------------------------------------------------------------- Wound Assessment Details Patient Name: Date of Service: Zachary Vazquez, Zachary Vazquez 01/05/2020 2:45 PM Medical Record [id number] Patient Account Number: [id number] Date of Birth/Sex: Treating RN: [birth year] (33 y.o. Male) Baruch Gouty Primary Care Yamira Papa: Kathe Becton Other Clinician: Referring Jojuan Champney: Treating Elberta Lachapelle/Extender:Stone III,  Sherlie Ban, Madolyn Frieze in Treatment: 0 Wound Status Wound Number: 1 Primary Diabetic Wound/Ulcer of the Lower Extremity Etiology: Wound Location: Left Lower Leg - Posterior Wound Open Wounding Event: Bite Status: Date Acquired: 11/24/2019 Comorbid Chronic sinus problems/congestion, Middle Weeks Of Treatment: 0 History: ear problems, Type I Diabetes, History of Clustered Wound: No Burn Photos Wound Measurements Length: (cm) 0.7 % Reduction in A Width: (cm) 0.7 % Reduction in V Depth: (cm) 0.1 Epithelializatio Area: (cm) 0.385 Tunneling: Volume: (cm) 0.038 Undermining: Wound Description Classification: Unable to visualize wound bed Foul Odor After Wound Margin: Distinct, outline attached Slough/Fibrino Exudate Amount: Medium Exudate Type: Serosanguineous Exudate Color: red, brown Wound Bed Necrotic Amount: Large (67-100%) Necrotic Quality: Adherent Slough Fascia Exposed: Fat Layer (Subcu Tendon Exposed: Muscle Exposed: Joint Exposed: Bone Exposed: Cleansing: No Yes Exposed Structure No taneous Tissue) Exposed: No No No No No rea: 0% olume: 0% n: None No No Assessment Notes circular rash to periwound. Treatment Notes Wound #1 (Left, Posterior Lower Leg) 1. Cleanse With Wound Cleanser 2. Periwound Care Antifungal cream TCA Cream 3. Primary Dressing Applied Collegen AG 4. Secondary Dressing Dry Gauze Roll Gauze 5. Secured With Recruitment consultant) Signed: 01/07/2020 4:47:30 PM By: Mikeal Hawthorne EMT/HBOT Signed: 01/07/2020 5:31:10 PM By: Baruch Gouty RN, BSN Previous Signature: 01/05/2020 5:34:34 PM Version By: Deon Pilling Entered By: Mikeal Hawthorne on 01/07/2020 11:00:06 -------------------------------------------------------------------------------- Vitals Details Patient Name: Date of Service: Zachary Vazquez, Zachary Vazquez 01/05/2020 2:45 PM Medical Record [id number] Patient Account Number: [id number] Date of Birth/Sex: Treating  RN: 03-17-1986 (33 y.o. Male) Deon Pilling Primary Care Kaitlynd Phillips: Kathe Becton Other Clinician: Referring Maddock Finigan: Treating Domique Reardon/Extender:Stone III, Sherlie Ban, Madolyn Frieze in Treatment: 0 Vital Signs Time Taken: 15:08 Temperature (F): 98.4 Height (in): 71 Pulse (bpm):  98 Source: Stated Respiratory Rate (breaths/min): 18 Weight (lbs): 180 Blood Pressure (mmHg): 121/79 Source: Stated Reference Range: 80 - 120 mg / dl Body Mass Index (BMI): 25.1 Electronic Signature(s) Signed: 01/05/2020 5:34:34 PM By: Shawn Stall Entered By: Shawn Stall on 01/05/2020 15:08:27

## 2020-01-05 NOTE — Progress Notes (Signed)
Zachary Vazquez ([id number]) Visit Report for 01/05/2020 Abuse/Suicide Risk Screen Details Patient Name: Date of Service: Zachary Vazquez, Zachary Vazquez 01/05/2020 2:45 PM Medical Record Zachary Vazquez:[id number] Patient Account Number: [id number] Date of Birth/Sex: Treating RN: 07-30-1986 (34 y.o. Zachary Vazquez Primary Care Forbes Loll: Zachary Vazquez Other Clinician: Referring Zachary Vazquez: Treating Zachary Vazquez/Extender:Zachary Vazquez, Zachary Vazquez, Zachary Vazquez in Treatment: 0 Abuse/Suicide Risk Screen Items Answer ABUSE RISK SCREEN: Has anyone close to you tried to hurt or harm you recentlyo No Do you feel uncomfortable with anyone in your familyo No Has anyone forced you do things that you didnt want to doo No Electronic Signature(s) Signed: 01/05/2020 5:34:34 PM By: Zachary Vazquez Entered By: Zachary Vazquez on 01/05/2020 15:05:22 -------------------------------------------------------------------------------- Activities of Daily Living Details Patient Name: Date of Service: Zachary Vazquez, Zachary Vazquez 01/05/2020 2:45 PM Medical Record Zachary Vazquez:[id number] Patient Account Number: [id number] Date of Birth/Sex: Treating RN: 1986/06/29 (33 y.o. Zachary Vazquez Primary Care Zachary Vazquez: Zachary Vazquez Other Clinician: Referring Lizandro Spellman: Treating Shelton Soler/Extender:Zachary Vazquez, Zachary Vazquez, Zachary Vazquez in Treatment: 0 Activities of Daily Living Items Answer Activities of Daily Living (Please select one for each item) Drive Automobile Completely Able Take Medications Completely Able Use Telephone Completely Able Care for Appearance Completely Able Use Toilet Completely Able Bath / Shower Completely Able Dress Self Completely Able Feed Self Completely Able Walk Completely Able Get In / Out Bed Completely Able Housework Completely Able Prepare Meals Completely Able Handle Money Completely Able Shop for Self Completely Able Electronic Signature(s) Signed: 01/05/2020 5:34:34 PM By: Zachary Vazquez Entered By: Zachary Vazquez on  01/05/2020 15:05:52 -------------------------------------------------------------------------------- Education Screening Details Patient Name: Date of Service: Zachary Vazquez 01/05/2020 2:45 PM Medical Record Zachary Vazquez:[id number] Patient Account Number: [id number] Date of Birth/Sex: Treating RN: Mar 07, 1986 (33 y.o. Zachary Vazquez Primary Care Zachary Vazquez: Zachary Vazquez Other Clinician: Referring Zachary Vazquez: Treating Zachary Vazquez/Extender:Zachary Vazquez, Zachary Vazquez, Zachary Vazquez in Treatment: 0 Primary Learner Assessed: Patient Learning Preferences/Education Level/Primary Language Learning Preference: Explanation, Demonstration, Printed Material Highest Education Level: College or Above Preferred Language: English Cognitive Barrier Language Barrier: No Translator Needed: No Memory Deficit: No Emotional Barrier: No Cultural/Religious Beliefs Affecting Medical Care: No Physical Barrier Impaired Vision: No Impaired Hearing: No Decreased Hand dexterity: No Knowledge/Comprehension Knowledge Level: High Comprehension Level: High Ability to understand written High instructions: Ability to understand verbal High instructions: Motivation Anxiety Level: Calm Cooperation: Cooperative Education Importance: Acknowledges Need Interest in Health Problems: Asks Questions Perception: Coherent Willingness to Engage in Self- High Management Activities: Readiness to Engage in Self- High Management Activities: Electronic Signature(s) Signed: 01/05/2020 5:34:34 PM By: Zachary Vazquez Entered By: Zachary Vazquez on 01/05/2020 15:06:31 -------------------------------------------------------------------------------- Fall Risk Assessment Details Patient Name: Date of Service: Zachary Vazquez, Zachary Vazquez 01/05/2020 2:45 PM Medical Record Zachary Vazquez:[id number] Patient Account Number: [id number] Date of Birth/Sex: Treating RN: 1986-08-08 (33 y.o. Zachary Vazquez Primary Care Juron Vorhees: Zachary Vazquez Other  Clinician: Referring Zachary Vazquez: Treating Zachary Vazquez/Extender:Zachary Vazquez, Zachary Vazquez, Zachary Vazquez in Treatment: 0 Fall Risk Assessment Items Have you had 2 or more falls in the last 12 monthso 0 No Have you had any fall that resulted in injury in the last 12 monthso 0 No FALLS RISK SCREEN History of falling - immediate or within 3 months 0 No Secondary diagnosis (Do you have 2 or more medical diagnoseso) 0 No Ambulatory aid None/bed rest/wheelchair/nurse 0 Yes Crutches/cane/walker 0 No Furniture 0 No Intravenous therapy Access/Saline/Heparin Lock 0 No Weak (short steps with or without shuffle, stooped but able to lift head 0 No while walking, may seek support from furniture) Impaired (short steps with shuffle, may have difficulty  arising from chair, 0 No head down, impaired balance) Mental Status Oriented to own ability 0 Yes Overestimates or forgets limitations 0 No Risk Level: Low Risk Score: 0 Electronic Signature(s) Signed: 01/05/2020 5:34:34 PM By: Zachary Vazquez Entered By: Zachary Vazquez on 01/05/2020 15:06:47 -------------------------------------------------------------------------------- Foot Assessment Details Patient Name: Date of Service: Zachary Vazquez, Zachary Vazquez 01/05/2020 2:45 PM Medical Record Zachary Vazquez:[id number] Patient Account Number: [id number] Date of Birth/Sex: Treating RN: 11/21/1985 (34 y.o. Zachary Vazquez Primary Care Zachary Vazquez: Zachary Vazquez Other Clinician: Referring Zachary Vazquez: Treating Zachary Vazquez/Extender:Zachary Vazquez, Zachary Vazquez, Zachary Vazquez in Treatment: 0 Foot Assessment Items Site Locations + = Sensation present, - = Sensation absent, C = Callus, U = Ulcer R = Redness, W = Warmth, M = Maceration, PU = Pre-ulcerative lesion F = Fissure, S = Swelling, D = Dryness Assessment Right: Left: Other Deformity: No No Prior Foot Ulcer: No No Prior Amputation: No No Charcot Joint: No No Ambulatory Status: Ambulatory Without Help Gait: Steady Electronic  Signature(s) Signed: 01/05/2020 5:34:34 PM By: Zachary Vazquez Entered By: Zachary Vazquez on 01/05/2020 15:30:37 -------------------------------------------------------------------------------- Nutrition Risk Screening Details Patient Name: Date of Service: Zachary Vazquez, Zachary Vazquez 01/05/2020 2:45 PM Medical Record [id number] Patient Account Number: [id number] Date of Birth/Sex: Treating RN: 1986-08-01 (34 y.o. Zachary Vazquez Primary Care Kyndall Chaplin: Zachary Vazquez Other Clinician: Referring Misti Towle: Treating Aili Casillas/Extender:Zachary Vazquez, Zachary Vazquez, Zachary Vazquez in Treatment: 0 Height (in): Weight (lbs): Body Mass Index (BMI): Nutrition Risk Screening Items Score Screening NUTRITION RISK SCREEN: I have an illness or condition that made me change the kind and/or 2 Yes amount of food I eat I eat fewer than two meals per day 0 No I eat few fruits and vegetables, or milk products 0 No I have three or more drinks of beer, liquor or wine almost every day 0 No I have tooth or mouth problems that make it hard for me to eat 0 No I don't always have enough money to buy the food I need 0 No I eat alone most of the time 0 No I take three or more different prescribed or over-the-counter drugs a day 1 Yes 0 No Without wanting to, I have lost or gained 10 pounds in the last six months I am not always physically able to shop, cook and/or feed myself 0 No Nutrition Protocols Good Risk Protocol Provide education on Moderate Risk Protocol 0 nutrition High Risk Proctocol Risk Level: Moderate Risk Score: 3 Electronic Signature(s) Signed: 01/05/2020 5:34:34 PM By: Zachary Vazquez Entered By: Zachary Vazquez on 01/05/2020 15:07:51

## 2020-01-12 ENCOUNTER — Encounter (HOSPITAL_BASED_OUTPATIENT_CLINIC_OR_DEPARTMENT_OTHER): Payer: Self-pay | Admitting: Physician Assistant

## 2020-01-14 ENCOUNTER — Encounter (HOSPITAL_BASED_OUTPATIENT_CLINIC_OR_DEPARTMENT_OTHER): Payer: Self-pay | Admitting: Internal Medicine

## 2020-01-19 ENCOUNTER — Encounter (HOSPITAL_BASED_OUTPATIENT_CLINIC_OR_DEPARTMENT_OTHER): Payer: Self-pay | Admitting: Physician Assistant

## 2020-01-26 MED FILL — !LANTUS 100 UNITS/ML VIAL: 100 | 28 days supply | Qty: 10 | Fill #2

## 2020-01-26 MED FILL — $novoLOG 100 UNITS/ML VIAL: 100 | 28 days supply | Qty: 10 | Fill #1

## 2020-02-21 MED FILL — !LANTUS 100 UNITS/ML VIAL: 100 | 28 days supply | Qty: 10 | Fill #3

## 2020-02-21 MED FILL — $novoLOG 100 UNITS/ML VIAL: 100 | 28 days supply | Qty: 10 | Fill #2

## 2020-03-21 MED FILL — $LANTUS 100 UNITS/ML VIAL: 100 | 28 days supply | Qty: 10 | Fill #4

## 2020-03-21 MED FILL — $novoLOG 100 UNITS/ML VIAL: 100 | 28 days supply | Qty: 10 | Fill #3

## 2020-04-03 ENCOUNTER — Ambulatory Visit: Payer: Self-pay | Admitting: Family Medicine

## 2020-04-11 ENCOUNTER — Encounter: Payer: Self-pay | Admitting: Family Medicine

## 2020-04-11 ENCOUNTER — Other Ambulatory Visit: Payer: Self-pay

## 2020-04-11 ENCOUNTER — Ambulatory Visit (INDEPENDENT_AMBULATORY_CARE_PROVIDER_SITE_OTHER): Payer: Self-pay | Admitting: Family Medicine

## 2020-04-11 VITALS — BP 125/88 | HR 83 | Temp 97.0°F | Ht 71.0 in | Wt 203.0 lb

## 2020-04-11 DIAGNOSIS — E109 Type 1 diabetes mellitus without complications: Secondary | ICD-10-CM

## 2020-04-11 DIAGNOSIS — T7840XA Allergy, unspecified, initial encounter: Secondary | ICD-10-CM

## 2020-04-11 DIAGNOSIS — S81802S Unspecified open wound, left lower leg, sequela: Secondary | ICD-10-CM

## 2020-04-11 DIAGNOSIS — R21 Rash and other nonspecific skin eruption: Secondary | ICD-10-CM

## 2020-04-11 DIAGNOSIS — Z87898 Personal history of other specified conditions: Secondary | ICD-10-CM

## 2020-04-11 DIAGNOSIS — R7309 Other abnormal glucose: Secondary | ICD-10-CM

## 2020-04-11 DIAGNOSIS — Z09 Encounter for follow-up examination after completed treatment for conditions other than malignant neoplasm: Secondary | ICD-10-CM

## 2020-04-11 LAB — GLUCOSE, POCT (MANUAL RESULT ENTRY): POC Glucose: 136 mg/dl — AB (ref 70–99)

## 2020-04-11 MED ORDER — CETIRIZINE HCL 10 MG PO TABS: 10.0000 mg | ORAL_TABLET | Freq: Every day | ORAL | 11 refills | Status: DC

## 2020-04-11 MED ORDER — TRIAMCINOLONE ACETONIDE 0.1 % EX CREA: 1.0000 "application " | TOPICAL_CREAM | Freq: Two times a day (BID) | CUTANEOUS | 3 refills | Status: DC

## 2020-04-11 MED FILL — TRIAMCINOLONE ACETONIDE 0.1: 0.1 | 15 days supply | Qty: 30 | Fill #0

## 2020-04-11 NOTE — Progress Notes (Signed)
[facility]   Established Patient Office Visit  Subjective:  Patient ID: Zachary Vazquez, male    DOB: 21-Dec-1985  Age: 34 y.o. MRN: [id number]  CC:  Chief Complaint  Patient presents with  . Follow-up    6 month follow up    HPI Zachary Vazquez is a 34 year old male presents for Follow Up today.   Patient Active Problem List   Diagnosis Date Noted  . Rash and nonspecific skin eruption 11/23/2019  . Spider bite 11/18/2019  . Wound of left leg 11/18/2019  . Nausea & vomiting 05/04/2019  . DKA, type 1 (HCC) 05/04/2019  . DKA (diabetic ketoacidoses) (HCC) 05/04/2019  . Nausea and vomiting 12/02/2017  . Tobacco use disorder 03/30/2017  . Type 1 diabetes mellitus (HCC) 03/30/2017  . Leukocytosis 03/30/2017    Past Medical History:  Diagnosis Date  . Type 1 diabetes mellitus (HCC)    Current Status: Since his last office visit, his is doing well. He has c/o mild left posterior leg rash (1cm). he is using creme prescribed by [facility]. Prior skin wound/rash was treated by wound clinic and has healed very well and has not returned. He has not been monitoring his blood glucose levels regularly recently. He denies fatigue, frequent urination, blurred vision, excessive hunger, excessive thirst, weight gain, weight loss, and poor wound healing. He continues to check his feet regularly. He denies fevers, chills, fatigue, recent infections, weight loss, and night sweats. He has not had any headaches, visual changes, dizziness, and falls. No chest pain, heart palpitations, cough and shortness of breath reported. Denies GI problems such as nausea, vomiting, diarrhea, and constipation. He has no reports of blood in stools, dysuria and hematuria. No depression or anxiety, and denies suicidal ideations, homicidal ideations, or auditory hallucinations. He is taking all medications as prescribed. He denies pain today.   Past Surgical History:    Procedure Laterality Date  . TONSILLECTOMY      Family History  Problem Relation Age of Onset  . Hypertension Mother   . Diabetes Mellitus I Brother     Social History   Socioeconomic History  . Marital status: Single    Spouse name: Not on file  . Number of children: Not on file  . Years of education: Not on file  . Highest education level: Not on file  Occupational History  . Not on file  Tobacco Use  . Smoking status: Current Every Day Smoker    Packs/day: 0.25    Years: 15.00    Pack years: 3.75    Types: Cigarettes  . Smokeless tobacco: Never Used  Vaping Use  . Vaping Use: Never used  Substance and Sexual Activity  . Alcohol use: Yes    Comment: occ  . Drug use: Not Currently    Types: Marijuana    Comment: occ  . Sexual activity: Yes    Birth control/protection: None  Other Topics Concern  . Not on file  Social History Narrative  . Not on file   Social Determinants of Health   Financial Resource Strain:   . Difficulty of Paying Living Expenses:   Food Insecurity:   . Worried About Programme researcher, broadcasting/film/video in the Last Year:   . Barista in the Last Year:   Transportation Needs:   . Freight forwarder (Medical):   Marland Kitchen Lack of Transportation (Non-Medical):   Physical Activity:   . Days of  Exercise per Week:   . Minutes of Exercise per Session:   Stress:   . Feeling of Stress :   Social Connections:   . Frequency of Communication with Friends and Family:   . Frequency of Social Gatherings with Friends and Family:   . Attends Religious Services:   . Active Member of Clubs or Organizations:   . Attends Banker Meetings:   Marland Kitchen Marital Status:   Intimate Partner Violence:   . Fear of Current or Ex-Partner:   . Emotionally Abused:   Marland Kitchen Physically Abused:   . Sexually Abused:     Outpatient Medications Prior to Visit  Medication Sig Dispense Refill  . insulin aspart (NOVOLOG) 100 UNIT/ML injection INJECT 5 UNITS INTO THE SKIN WITH  BREAKFAST, 7 UNITS WITH LUNCH, & 7 UNITS WITH DINNER. HOLD SHORT-ACTING INSULIN IF BLOOD SUGAR <125 20 mL 11  . insulin glargine (LANTUS) 100 UNIT/ML injection Inject 0.3 mLs (30 Units total) into the skin at bedtime. 10 mL 12  . diphenhydrAMINE (BENADRYL) 25 MG tablet Take 25 mg by mouth every 6 (six) hours as needed for itching or allergies. (Patient not taking: Reported on 04/11/2020)     No facility-administered medications prior to visit.    Allergies  Allergen Reactions  . Clindamycin/Lincomycin Anaphylaxis  . Bactrim [Sulfamethoxazole-Trimethoprim] Swelling    Eye swelling and bumps on eyes     ROS Review of Systems  Constitutional: Negative.   HENT: Negative.   Eyes: Negative.   Respiratory: Negative.   Cardiovascular: Negative.   Gastrointestinal: Negative.   Endocrine: Negative.   Genitourinary: Negative.   Musculoskeletal: Negative.   Skin: Positive for rash (mild).  Allergic/Immunologic: Negative.   Neurological: Positive for dizziness (occasional) and headaches (occasional ).  Hematological: Negative.   Psychiatric/Behavioral: Negative.       Objective:    Physical Exam Vitals and nursing note reviewed.  Constitutional:      Appearance: Normal appearance.  HENT:     Head: Normocephalic and atraumatic.     Nose: Nose normal.     Mouth/Throat:     Mouth: Mucous membranes are moist.  Cardiovascular:     Rate and Rhythm: Normal rate and regular rhythm.     Pulses: Normal pulses.     Heart sounds: Normal heart sounds.  Pulmonary:     Effort: Pulmonary effort is normal.     Breath sounds: Normal breath sounds.  Abdominal:     General: Abdomen is flat. Bowel sounds are normal.     Palpations: Abdomen is soft.  Musculoskeletal:     Cervical back: Normal range of motion and neck supple.       Legs:  Skin:    General: Skin is warm and dry.  Neurological:     Mental Status: He is alert.     BP 125/88 (BP Location: Left Arm, Patient Position: Sitting,  Cuff Size: Large)   Pulse 83   Temp (!) 97 F (36.1 C)   Ht 5\' 11"  (1.803 m)   Wt 203 lb 0.4 oz (92.1 kg)   SpO2 99%   BMI 28.32 kg/m  Wt Readings from Last 3 Encounters:  04/11/20 203 lb 0.4 oz (92.1 kg)  12/31/19 196 lb (88.9 kg)  11/29/19 190 lb 3.2 oz (86.3 kg)     Health Maintenance Due  Topic Date Due  . Hepatitis C Screening  Never done  . PNEUMOCOCCAL POLYSACCHARIDE VACCINE AGE 52-64 HIGH RISK  Never done  . OPHTHALMOLOGY EXAM  Never done  . COVID-19 Vaccine (1) Never done  . TETANUS/TDAP  Never done  . FOOT EXAM  12/22/2018    There are no preventive care reminders to display for this patient.  Lab Results  Component Value Date   TSH 2.410 12/22/2017   Lab Results  Component Value Date   WBC 6.6 12/31/2019   HGB 16.5 12/31/2019   HCT 49.6 12/31/2019   MCV 96 12/31/2019   PLT 228 12/31/2019   Lab Results  Component Value Date   NA 133 (L) 11/24/2019   K 4.6 11/24/2019   CO2 20 (L) 11/24/2019   GLUCOSE 266 (H) 11/24/2019   BUN 16 11/24/2019   CREATININE 0.91 11/24/2019   BILITOT 1.4 (H) 11/23/2019   ALKPHOS 59 11/23/2019   AST 12 (L) 11/23/2019   ALT 14 11/23/2019   PROT 6.8 11/23/2019   ALBUMIN 4.0 11/23/2019   CALCIUM 8.4 (L) 11/24/2019   ANIONGAP 12 11/24/2019   No results found for: CHOL No results found for: HDL No results found for: LDLCALC No results found for: TRIG No results found for: CHOLHDL Lab Results  Component Value Date   HGBA1C 6.7 (H) 11/23/2019      Assessment & Plan:   1. Type 1 diabetes mellitus without complication (HCC) - Glucose (CBG)  2. Hemoglobin A1c less than 7% Hgb stable at 6.7. Monitor.   3. History of delayed wound healing  4. History of left lower extremity wound Well-healed wound.   5. Skin rash Stable. Measures about 1 cm. Patient advised to keep observation.  - triamcinolone cream (KENALOG) 0.1 %; Apply 1 application topically 2 (two) times daily.  Dispense: 30 g; Refill: 3  3. Allergy,  initial encounter We will initiate Triamcinolone today to aid with mild skin rash.  - triamcinolone cream (KENALOG) 0.1 %; Apply 1 application topically 2 (two) times daily.  Dispense: 30 g; Refill: 3 - cetirizine (ZYRTEC) 10 MG tablet; Take 1 tablet (10 mg total) by mouth daily.  Dispense: 30 tablet; Refill: 11  4. Follow up He will follow up in 6 months.   Meds ordered this encounter  Medications  . triamcinolone cream (KENALOG) 0.1 %    Sig: Apply 1 application topically 2 (two) times daily.    Dispense:  30 g    Refill:  3  . cetirizine (ZYRTEC) 10 MG tablet    Sig: Take 1 tablet (10 mg total) by mouth daily.    Dispense:  30 tablet    Refill:  11    Orders Placed This Encounter  Procedures  . Glucose (CBG)    Referral Orders  No referral(s) requested today    Kathe Becton,  MSN, FNP-BC [place] [employer], [place] [address] [phone]- fax  Problem List Items Addressed This Visit      Endocrine   Type 1 diabetes mellitus ([facility]) - Primary   Relevant Orders   Glucose (CBG) (Completed)    Other Visit Diagnoses    Skin rash       Relevant Medications   triamcinolone cream (KENALOG) 0.1 %   Allergy, initial encounter       Relevant Medications   triamcinolone cream (KENALOG) 0.1 %   cetirizine (ZYRTEC) 10 MG tablet   Follow up          Meds ordered this encounter  Medications  . triamcinolone cream (KENALOG) 0.1 %  Sig: Apply 1 application topically 2 (two) times daily.    Dispense:  30 g    Refill:  3  . cetirizine (ZYRTEC) 10 MG tablet    Sig: Take 1 tablet (10 mg total) by mouth daily.    Dispense:  30 tablet    Refill:  11    Follow-up: Return in about 6 months (around 10/11/2020).    [name], FNP

## 2020-04-16 ENCOUNTER — Encounter: Payer: Self-pay | Admitting: Family Medicine

## 2020-04-16 DIAGNOSIS — Z87898 Personal history of other specified conditions: Secondary | ICD-10-CM | POA: Insufficient documentation

## 2020-04-16 DIAGNOSIS — R7309 Other abnormal glucose: Secondary | ICD-10-CM | POA: Insufficient documentation

## 2020-04-16 DIAGNOSIS — T7840XA Allergy, unspecified, initial encounter: Secondary | ICD-10-CM | POA: Insufficient documentation

## 2020-04-17 MED FILL — $novoLOG 100 UNITS/ML VIAL: 100 | 28 days supply | Qty: 10 | Fill #4

## 2020-04-17 MED FILL — $LANTUS 100 UNITS/ML VIAL: 100 | 28 days supply | Qty: 10 | Fill #5

## 2020-04-27 ENCOUNTER — Other Ambulatory Visit: Payer: Self-pay

## 2020-04-27 ENCOUNTER — Inpatient Hospital Stay (HOSPITAL_COMMUNITY)
Admission: EM | Admit: 2020-04-27 | Discharge: 2020-05-01 | DRG: 638 | Disposition: A | Discharge status: Home / Self Care | Payer: Self-pay | Attending: Internal Medicine | Admitting: Internal Medicine

## 2020-04-27 DIAGNOSIS — E1010 Type 1 diabetes mellitus with ketoacidosis without coma: Principal | ICD-10-CM | POA: Diagnosis present

## 2020-04-27 DIAGNOSIS — R110 Nausea: Secondary | ICD-10-CM

## 2020-04-27 DIAGNOSIS — Z8249 Family history of ischemic heart disease and other diseases of the circulatory system: Secondary | ICD-10-CM

## 2020-04-27 DIAGNOSIS — E1043 Type 1 diabetes mellitus with diabetic autonomic (poly)neuropathy: Secondary | ICD-10-CM | POA: Diagnosis present

## 2020-04-27 DIAGNOSIS — R030 Elevated blood-pressure reading, without diagnosis of hypertension: Secondary | ICD-10-CM | POA: Diagnosis present

## 2020-04-27 DIAGNOSIS — E1110 Type 2 diabetes mellitus with ketoacidosis without coma: Secondary | ICD-10-CM | POA: Diagnosis present

## 2020-04-27 DIAGNOSIS — D72829 Elevated white blood cell count, unspecified: Secondary | ICD-10-CM | POA: Diagnosis present

## 2020-04-27 DIAGNOSIS — F1210 Cannabis abuse, uncomplicated: Secondary | ICD-10-CM | POA: Diagnosis present

## 2020-04-27 DIAGNOSIS — Z794 Long term (current) use of insulin: Secondary | ICD-10-CM

## 2020-04-27 DIAGNOSIS — R739 Hyperglycemia, unspecified: Secondary | ICD-10-CM | POA: Diagnosis present

## 2020-04-27 DIAGNOSIS — R112 Nausea with vomiting, unspecified: Secondary | ICD-10-CM | POA: Diagnosis present

## 2020-04-27 DIAGNOSIS — K2970 Gastritis, unspecified, without bleeding: Secondary | ICD-10-CM | POA: Diagnosis present

## 2020-04-27 DIAGNOSIS — E860 Dehydration: Secondary | ICD-10-CM | POA: Diagnosis present

## 2020-04-27 DIAGNOSIS — Z881 Allergy status to other antibiotic agents status: Secondary | ICD-10-CM

## 2020-04-27 DIAGNOSIS — K5900 Constipation, unspecified: Secondary | ICD-10-CM | POA: Diagnosis present

## 2020-04-27 DIAGNOSIS — Z833 Family history of diabetes mellitus: Secondary | ICD-10-CM

## 2020-04-27 DIAGNOSIS — Z882 Allergy status to sulfonamides status: Secondary | ICD-10-CM

## 2020-04-27 DIAGNOSIS — F17210 Nicotine dependence, cigarettes, uncomplicated: Secondary | ICD-10-CM | POA: Diagnosis present

## 2020-04-27 DIAGNOSIS — Z20822 Contact with and (suspected) exposure to covid-19: Secondary | ICD-10-CM | POA: Diagnosis present

## 2020-04-27 DIAGNOSIS — N179 Acute kidney failure, unspecified: Secondary | ICD-10-CM | POA: Diagnosis present

## 2020-04-27 DIAGNOSIS — E874 Mixed disorder of acid-base balance: Secondary | ICD-10-CM | POA: Diagnosis present

## 2020-04-27 DIAGNOSIS — Z79899 Other long term (current) drug therapy: Secondary | ICD-10-CM

## 2020-04-27 DIAGNOSIS — K3184 Gastroparesis: Secondary | ICD-10-CM | POA: Diagnosis present

## 2020-04-27 LAB — CBC WITH DIFFERENTIAL/PLATELET
Abs Immature Granulocytes: 0.11 10*3/uL — ABNORMAL HIGH (ref 0.00–0.07)
Basophils Absolute: 0.1 10*3/uL (ref 0.0–0.1)
Basophils Relative: 0 %
Eosinophils Absolute: 0.1 10*3/uL (ref 0.0–0.5)
Eosinophils Relative: 1 %
HCT: 54.7 % — ABNORMAL HIGH (ref 39.0–52.0)
Hemoglobin: 18.2 g/dL — ABNORMAL HIGH (ref 13.0–17.0)
Immature Granulocytes: 1 %
Lymphocytes Relative: 10 %
Lymphs Abs: 2 10*3/uL (ref 0.7–4.0)
MCH: 30.9 pg (ref 26.0–34.0)
MCHC: 33.3 g/dL (ref 30.0–36.0)
MCV: 92.9 fL (ref 80.0–100.0)
Monocytes Absolute: 0.6 10*3/uL (ref 0.1–1.0)
Monocytes Relative: 3 %
Neutro Abs: 16 10*3/uL — ABNORMAL HIGH (ref 1.7–7.7)
Neutrophils Relative %: 85 %
Platelets: ADEQUATE 10*3/uL (ref 150–400)
RBC: 5.89 MIL/uL — ABNORMAL HIGH (ref 4.22–5.81)
RDW: 11.9 % (ref 11.5–15.5)
WBC: 18.9 10*3/uL — ABNORMAL HIGH (ref 4.0–10.5)
nRBC: 0 % (ref 0.0–0.2)

## 2020-04-27 LAB — CBG MONITORING, ED
Glucose-Capillary: 267 mg/dL — ABNORMAL HIGH (ref 70–99)
Glucose-Capillary: 244 mg/dL — ABNORMAL HIGH (ref 70–99)
Glucose-Capillary: 202 mg/dL — ABNORMAL HIGH (ref 70–99)
Glucose-Capillary: 186 mg/dL — ABNORMAL HIGH (ref 70–99)
Glucose-Capillary: 152 mg/dL — ABNORMAL HIGH (ref 70–99)
Glucose-Capillary: 96 mg/dL (ref 70–99)

## 2020-04-27 LAB — COMPREHENSIVE METABOLIC PANEL
ALT: 21 U/L (ref 0–44)
AST: 23 U/L (ref 15–41)
Albumin: 5.1 g/dL — ABNORMAL HIGH (ref 3.5–5.0)
Alkaline Phosphatase: 72 U/L (ref 38–126)
Anion gap: 21 — ABNORMAL HIGH (ref 5–15)
BUN: 20 mg/dL (ref 6–20)
CO2: 19 mmol/L — ABNORMAL LOW (ref 22–32)
Calcium: 10.2 mg/dL (ref 8.9–10.3)
Chloride: 97 mmol/L — ABNORMAL LOW (ref 98–111)
Creatinine, Ser: 1.41 mg/dL — ABNORMAL HIGH (ref 0.61–1.24)
GFR calc Af Amer: >60 mL/min (ref >60)
GFR calc non Af Amer: >60 mL/min (ref >60)
Glucose, Bld: 296 mg/dL — ABNORMAL HIGH (ref 70–99)
Potassium: 4.1 mmol/L (ref 3.5–5.1)
Sodium: 137 mmol/L (ref 135–145)
Total Bilirubin: 1.8 mg/dL — ABNORMAL HIGH (ref 0.3–1.2)
Total Protein: 8.2 g/dL — ABNORMAL HIGH (ref 6.5–8.1)

## 2020-04-27 LAB — URINALYSIS, ROUTINE W REFLEX MICROSCOPIC
Bacteria, UA: NONE SEEN
Bilirubin Urine: NEGATIVE
Glucose, UA: >=500 mg/dL — AB
Hgb urine dipstick: NEGATIVE
Ketones, ur: 80 mg/dL — AB
Leukocytes,Ua: NEGATIVE
Nitrite: NEGATIVE
Protein, ur: NEGATIVE mg/dL
Specific Gravity, Urine: 1.029 (ref 1.005–1.030)
pH: 5 (ref 5.0–8.0)

## 2020-04-27 LAB — I-STAT VENOUS BLOOD GAS, ED
Acid-Base Excess: 2 mmol/L (ref 0.0–2.0)
Bicarbonate: 22.4 mmol/L (ref 20.0–28.0)
Calcium, Ion: 0.99 mmol/L — ABNORMAL LOW (ref 1.15–1.40)
HCT: 55 % — ABNORMAL HIGH (ref 39.0–52.0)
Hemoglobin: 18.7 g/dL — ABNORMAL HIGH (ref 13.0–17.0)
O2 Saturation: 88 %
Potassium: 5.2 mmol/L — ABNORMAL HIGH (ref 3.5–5.1)
Sodium: 135 mmol/L (ref 135–145)
TCO2: 23 mmol/L (ref 22–32)
pCO2, Ven: 26.1 mmHg — ABNORMAL LOW (ref 44.0–60.0)
pH, Ven: 7.541 — ABNORMAL HIGH (ref 7.250–7.430)
pO2, Ven: 47 mmHg — ABNORMAL HIGH (ref 32.0–45.0)

## 2020-04-27 LAB — LACTIC ACID, PLASMA
Lactic Acid, Venous: 2.2 mmol/L (ref 0.5–1.9)
Lactic Acid, Venous: 2.4 mmol/L (ref 0.5–1.9)

## 2020-04-27 LAB — I-STAT CHEM 8, ED
BUN: 29 mg/dL — ABNORMAL HIGH (ref 6–20)
Calcium, Ion: 1.01 mmol/L — ABNORMAL LOW (ref 1.15–1.40)
Chloride: 102 mmol/L (ref 98–111)
Creatinine, Ser: 1.1 mg/dL (ref 0.61–1.24)
Glucose, Bld: 309 mg/dL — ABNORMAL HIGH (ref 70–99)
HCT: 54 % — ABNORMAL HIGH (ref 39.0–52.0)
Hemoglobin: 18.4 g/dL — ABNORMAL HIGH (ref 13.0–17.0)
Potassium: 5.4 mmol/L — ABNORMAL HIGH (ref 3.5–5.1)
Sodium: 135 mmol/L (ref 135–145)
TCO2: 22 mmol/L (ref 22–32)

## 2020-04-27 LAB — BASIC METABOLIC PANEL
Anion gap: 12 (ref 5–15)
BUN: 19 mg/dL (ref 6–20)
CO2: 21 mmol/L — ABNORMAL LOW (ref 22–32)
Calcium: 8.5 mg/dL — ABNORMAL LOW (ref 8.9–10.3)
Chloride: 106 mmol/L (ref 98–111)
Creatinine, Ser: 1.27 mg/dL — ABNORMAL HIGH (ref 0.61–1.24)
GFR calc Af Amer: >60 mL/min (ref >60)
GFR calc non Af Amer: >60 mL/min (ref >60)
Glucose, Bld: 209 mg/dL — ABNORMAL HIGH (ref 70–99)
Potassium: 3.8 mmol/L (ref 3.5–5.1)
Sodium: 139 mmol/L (ref 135–145)

## 2020-04-27 LAB — LIPASE, BLOOD: Lipase: 18 U/L (ref 11–51)

## 2020-04-27 LAB — GLUCOSE, CAPILLARY: Glucose-Capillary: 83 mg/dL (ref 70–99)

## 2020-04-27 LAB — RAPID URINE DRUG SCREEN, HOSP PERFORMED
Amphetamines: NOT DETECTED
Barbiturates: NOT DETECTED
Benzodiazepines: NOT DETECTED
Cocaine: NOT DETECTED
Opiates: NOT DETECTED
Tetrahydrocannabinol: POSITIVE — AB

## 2020-04-27 LAB — BETA-HYDROXYBUTYRIC ACID
Beta-Hydroxybutyric Acid: 3.31 mmol/L — ABNORMAL HIGH (ref 0.05–0.27)
Beta-Hydroxybutyric Acid: 2.55 mmol/L — ABNORMAL HIGH (ref 0.05–0.27)

## 2020-04-27 LAB — HEMOGLOBIN A1C
Hgb A1c MFr Bld: 7 % — ABNORMAL HIGH (ref 4.8–5.6)
Mean Plasma Glucose: 154.2 mg/dL

## 2020-04-27 LAB — SARS CORONAVIRUS 2 BY RT PCR (HOSPITAL ORDER, PERFORMED IN ~~LOC~~ HOSPITAL LAB): SARS Coronavirus 2: NEGATIVE

## 2020-04-27 MED ORDER — INSULIN REGULAR(HUMAN) IN NACL 100-0.9 UT/100ML-% IV SOLN
INTRAVENOUS | Status: DC
  Administered 2020-04-27: 4.8 [IU]/h via INTRAVENOUS
  Filled 2020-04-27: qty 100

## 2020-04-27 MED ORDER — PROMETHAZINE HCL 25 MG/ML IJ SOLN
25.0000 mg | Freq: Once | INTRAMUSCULAR | Status: AC
  Administered 2020-04-27: 25 mg via INTRAVENOUS
  Filled 2020-04-27: qty 1

## 2020-04-27 MED ORDER — SODIUM CHLORIDE 0.9 % IV SOLN: INTRAVENOUS | Status: DC

## 2020-04-27 MED ORDER — SODIUM CHLORIDE 0.9 % IV BOLUS
1000.0000 mL | Freq: Once | INTRAVENOUS | Status: AC
  Administered 2020-04-27: 1000 mL via INTRAVENOUS

## 2020-04-27 MED ORDER — ONDANSETRON HCL 4 MG/2ML IJ SOLN
4.0000 mg | Freq: Once | INTRAMUSCULAR | Status: AC
  Administered 2020-04-27: 4 mg via INTRAVENOUS
  Filled 2020-04-27: qty 2

## 2020-04-27 MED ORDER — INSULIN ASPART 100 UNIT/ML ~~LOC~~ SOLN: 0.0000 [IU] | Freq: Every day | SUBCUTANEOUS | Status: DC

## 2020-04-27 MED ORDER — DEXTROSE-NACL 5-0.45 % IV SOLN: INTRAVENOUS | Status: DC

## 2020-04-27 MED ORDER — SODIUM CHLORIDE 0.9 % IV SOLN: INTRAVENOUS | Status: AC

## 2020-04-27 MED ORDER — DEXTROSE 50 % IV SOLN: 0.0000 mL | INTRAVENOUS | Status: DC | PRN

## 2020-04-27 MED ORDER — LACTATED RINGERS IV BOLUS
20.0000 mL/kg | Freq: Once | INTRAVENOUS | Status: AC
  Administered 2020-04-27: 1814 mL via INTRAVENOUS

## 2020-04-27 MED ORDER — SODIUM CHLORIDE 0.9 % IV BOLUS
2000.0000 mL | Freq: Once | INTRAVENOUS | Status: AC
  Administered 2020-04-27: 2000 mL via INTRAVENOUS

## 2020-04-27 MED ORDER — ONDANSETRON HCL 4 MG/2ML IJ SOLN
4.0000 mg | Freq: Four times a day (QID) | INTRAMUSCULAR | Status: DC | PRN
  Administered 2020-04-27 – 2020-04-29 (×4): 4 mg via INTRAVENOUS
  Filled 2020-04-27 (×4): qty 2

## 2020-04-27 MED ORDER — ENOXAPARIN SODIUM 40 MG/0.4ML ~~LOC~~ SOLN
40.0000 mg | SUBCUTANEOUS | Status: DC
  Administered 2020-04-27 – 2020-05-01 (×5): 40 mg via SUBCUTANEOUS
  Filled 2020-04-27 (×5): qty 0.4

## 2020-04-27 MED ORDER — INSULIN GLARGINE 100 UNIT/ML ~~LOC~~ SOLN
30.0000 [IU] | Freq: Every day | SUBCUTANEOUS | Status: DC
  Administered 2020-04-27: 30 [IU] via SUBCUTANEOUS
  Filled 2020-04-27 (×2): qty 0.3

## 2020-04-27 MED ORDER — PANTOPRAZOLE SODIUM 40 MG IV SOLR
40.0000 mg | Freq: Once | INTRAVENOUS | Status: AC
  Administered 2020-04-27: 40 mg via INTRAVENOUS
  Filled 2020-04-27: qty 40

## 2020-04-27 MED ORDER — HYDRALAZINE HCL 25 MG PO TABS: 25.0000 mg | ORAL_TABLET | Freq: Four times a day (QID) | ORAL | Status: DC | PRN

## 2020-04-27 MED ORDER — LORAZEPAM 2 MG/ML IJ SOLN
0.5000 mg | Freq: Once | INTRAMUSCULAR | Status: AC
  Administered 2020-04-27: 0.5 mg via INTRAVENOUS
  Filled 2020-04-27: qty 1

## 2020-04-27 MED ORDER — INSULIN ASPART 100 UNIT/ML ~~LOC~~ SOLN
8.0000 [IU] | Freq: Once | SUBCUTANEOUS | Status: AC
  Administered 2020-04-27: 8 [IU] via INTRAVENOUS

## 2020-04-27 MED ORDER — SODIUM CHLORIDE 0.9 % IV BOLUS: 1000.0000 mL | INTRAVENOUS | Status: AC

## 2020-04-27 MED ORDER — INSULIN ASPART 100 UNIT/ML ~~LOC~~ SOLN
0.0000 [IU] | Freq: Three times a day (TID) | SUBCUTANEOUS | Status: DC
  Administered 2020-04-28: 3 [IU] via SUBCUTANEOUS
  Administered 2020-04-28: 2 [IU] via SUBCUTANEOUS
  Administered 2020-04-29: 5 [IU] via SUBCUTANEOUS
  Administered 2020-04-29: 3 [IU] via SUBCUTANEOUS
  Administered 2020-04-29: 11 [IU] via SUBCUTANEOUS
  Administered 2020-04-30 (×2): 3 [IU] via SUBCUTANEOUS
  Administered 2020-04-30: 8 [IU] via SUBCUTANEOUS
  Administered 2020-05-01: 5 [IU] via SUBCUTANEOUS
  Administered 2020-05-01: 3 [IU] via SUBCUTANEOUS

## 2020-04-27 NOTE — ED Notes (Signed)
Report given to 6n

## 2020-04-27 NOTE — ED Provider Notes (Signed)
  Physical Exam  BP (!) 161/99   Pulse (!) 138   Temp 97.7 F (36.5 C) (Axillary)   Resp (!) 23   Ht 5\' 11"  (1.803 m)   Wt 90.7 kg   SpO2 99%   BMI 27.89 kg/m   Physical Exam  ED Course/Procedures     .Critical Care Performed by: , MD Authorized by: Alvira Monday, MD   Critical care provider statement:    Critical care time (minutes):  30   Critical care was time spent personally by me on the following activities:  Evaluation of patient's response to treatment, examination of patient, ordering and performing treatments and interventions, ordering and review of laboratory studies, pulse oximetry, re-evaluation of patient's condition and obtaining history from patient or surrogate    MDM  34yo male with history of DM presents with nausea/vomiting. Labs show AG, bicarb 19, beta hydroxybutyric acid elevated.  Continued tachycardia, nausea. Findings consistent with mild DKA. Started insulin gtt and dextrose and will admit.        Alvira Monday, MD 04/27/20 2211

## 2020-04-27 NOTE — ED Notes (Signed)
Patient aware of need for urine specimen, patient states he is unable to provide specimen at this time.

## 2020-04-27 NOTE — ED Notes (Signed)
Lunch tray ordered 

## 2020-04-27 NOTE — H&P (Signed)
History and Physical    Irvine Glorioso CZY:[id number] DOB: [birth year] DOA: 04/27/2020  PCP: [name], FNP (Confirm with patient/family/NH records and if not entered, this has to be entered at [facility] point of entry) Patient coming from: Home  I have personally briefly reviewed patient's old medical records in [facility]  Chief Complaint: Nausea vomiting  HPI: Zachary Vazquez is a 34 y.o. male with medical history significant of history significant for DM 1 with previous history of DKA who presents with new onset nausea vomiting. His symptoms started 24 hours ago, denied any possibility of food toxin, abdominal pain no fever chills and no diarrhea.  Recently been feeling nauseous the whole day, vomiting almost every hour, with some abnormal content nonbilious no blood no coffee-ground stuff.  He lost appetite and he tried to cut down is insulin to help to dosage, and his finger stick more than 300 over yesterday. ED Course: Glucose more than 300, bicarb 19, anion gap 21, blood gas pH 7.5, PCO2 26, lactic acid 2, ketone body 3.  Review of Systems: As per HPI otherwise 10 point review of systems negative.    Past Medical History:  Diagnosis Date  . History of delayed wound healing 10/2019  . Rash and nonspecific skin eruption 10/2019  . Type 1 diabetes mellitus (HCC)   . Wound of lower extremity, left, sequela 10/2019    Past Surgical History:  Procedure Laterality Date  . TONSILLECTOMY       reports that he has been smoking cigarettes. He has a 3.75 pack-year smoking history. He has never used smokeless tobacco. He reports current alcohol use. He reports previous drug use. Drug: Marijuana.  Allergies  Allergen Reactions  . Clindamycin/Lincomycin Anaphylaxis  . Bactrim [Sulfamethoxazole-Trimethoprim] Swelling    Eye swelling and bumps on eyes     Family History  Problem Relation Age of Onset  . Hypertension Mother   . Diabetes Mellitus I Brother      Prior to  Admission medications   Medication Sig Start Date End Date Taking? Authorizing Provider  insulin aspart (NOVOLOG) 100 UNIT/ML injection INJECT 5 UNITS INTO THE SKIN WITH BREAKFAST, 7 UNITS WITH LUNCH, & 7 UNITS WITH DINNER. HOLD SHORT-ACTING INSULIN IF BLOOD SUGAR <125 Patient taking differently: Inject 5-7 Units into the skin 3 (three) times daily with meals. Administer 5 units at breakfast, 7 units at lunch and dinner. Hold if blood sugar is less than 125. 12/20/19  Yes [name], FNP  insulin glargine (LANTUS) 100 UNIT/ML injection Inject 0.3 mLs (30 Units total) into the skin at bedtime. 11/29/19  Yes [name], FNP  triamcinolone cream (KENALOG) 0.1 % Apply 1 application topically 2 (two) times daily. 04/11/20  Yes [name], FNP  cetirizine (ZYRTEC) 10 MG tablet Take 1 tablet (10 mg total) by mouth daily. Patient not taking: Reported on 04/27/2020 04/11/20   [name], FNP  diphenhydrAMINE (BENADRYL) 25 MG tablet Take 25 mg by mouth every 6 (six) hours as needed for itching or allergies. Patient not taking: Reported on 04/11/2020    [provider]    Physical Exam: Vitals:   04/27/20 0600 04/27/20 0630 04/27/20 0634 04/27/20 0658  BP: (!) 153/94 135/77  (!) 161/99  Pulse: (!) 103 (!) 102  (!) 138  Resp: 14 (!) 24  (!) 23  Temp:   97.7 F (36.5 C)   TempSrc:   Axillary   SpO2: 100% 100%  99%  Weight:  Height:        Constitutional: NAD, calm, comfortable Vitals:   04/27/20 0600 04/27/20 0630 04/27/20 0634 04/27/20 0658  BP: (!) 153/94 135/77  (!) 161/99  Pulse: (!) 103 (!) 102  (!) 138  Resp: 14 (!) 24  (!) 23  Temp:   97.7 F (36.5 C)   TempSrc:   Axillary   SpO2: 100% 100%  99%  Weight:      Height:       Eyes: PERRL, lids and conjunctivae normal ENMT: Mucous membranes are dry. Posterior pharynx clear of any exudate or lesions.Normal dentition.  Neck: normal, supple, no masses, no thyromegaly Respiratory: clear to auscultation  bilaterally, no wheezing, no crackles. Normal respiratory effort. No accessory muscle use.  Cardiovascular: Regular rate and rhythm, no murmurs / rubs / gallops. No extremity edema. 2+ pedal pulses. No carotid bruits.  Abdomen: no tenderness, no masses palpated. No hepatosplenomegaly. Bowel sounds positive.  Musculoskeletal: no clubbing / cyanosis. No joint deformity upper and lower extremities. Good ROM, no contractures. Normal muscle tone.  Skin: no rashes, lesions, ulcers. No induration Neurologic: CN 2-12 grossly intact. Sensation intact, DTR normal. Strength 5/5 in all 4.  Psychiatric: Normal judgment and insight. Alert and oriented x 3. Normal mood.    Labs on Admission: I have personally reviewed following labs and imaging studies  CBC: Recent Labs  Lab 04/27/20 0554 04/27/20 0619  WBC 18.9*  --   NEUTROABS 16.0*  --   HGB 18.2* 18.7*  18.4*  HCT 54.7* 55.0*  54.0*  MCV 92.9  --   PLT PLATELET CLUMPS NOTED ON SMEAR, COUNT APPEARS ADEQUATE  --    Basic Metabolic Panel: Recent Labs  Lab 04/27/20 0554 04/27/20 0619  NA 137 135  135  K 4.1 5.2*  5.4*  CL 97* 102  CO2 19*  --   GLUCOSE 296* 309*  BUN 20 29*  CREATININE 1.41* 1.10  CALCIUM 10.2  --    GFR: Estimated Creatinine Clearance: 110.1 mL/min (by C-G formula based on SCr of 1.1 mg/dL). Liver Function Tests: Recent Labs  Lab 04/27/20 0554  AST 23  ALT 21  ALKPHOS 72  BILITOT 1.8*  PROT 8.2*  ALBUMIN 5.1*   Recent Labs  Lab 04/27/20 0554  LIPASE 18   No results for input(s): AMMONIA in the last 168 hours. Coagulation Profile: No results for input(s): INR, PROTIME in the last 168 hours. Cardiac Enzymes: No results for input(s): CKTOTAL, CKMB, CKMBINDEX, TROPONINI in the last 168 hours. BNP (last 3 results) No results for input(s): PROBNP in the last 8760 hours. HbA1C: No results for input(s): HGBA1C in the last 72 hours. CBG: Recent Labs  Lab 04/27/20 0545 04/27/20 0907  GLUCAP 267* 244*     Lipid Profile: No results for input(s): CHOL, HDL, LDLCALC, TRIG, CHOLHDL, LDLDIRECT in the last 72 hours. Thyroid Function Tests: No results for input(s): TSH, T4TOTAL, FREET4, T3FREE, THYROIDAB in the last 72 hours. Anemia Panel: No results for input(s): VITAMINB12, FOLATE, FERRITIN, TIBC, IRON, RETICCTPCT in the last 72 hours. Urine analysis:    Component Value Date/Time   COLORURINE YELLOW 11/23/2019 2013   APPEARANCEUR CLEAR 11/23/2019 2013   LABSPEC 1.022 11/23/2019 2013   PHURINE 5.0 11/23/2019 2013   GLUCOSEU >=500 (A) 11/23/2019 2013   HGBUR NEGATIVE 11/23/2019 2013   BILIRUBINUR Negative 11/29/2019 1348   KETONESUR 20 (A) 11/23/2019 2013   PROTEINUR Negative 11/29/2019 1348   PROTEINUR NEGATIVE 11/23/2019 2013   UROBILINOGEN 0.2 11/29/2019  1348   UROBILINOGEN 0.2 01/19/2018 0838   NITRITE Negative 11/29/2019 1348   NITRITE NEGATIVE 11/23/2019 2013   LEUKOCYTESUR Negative 11/29/2019 1348   LEUKOCYTESUR NEGATIVE 11/23/2019 2013    Radiological Exams on Admission: No results found.  EKG: Independently reviewed.  Sinus tachycardia  Assessment/Plan Active Problems:   DKA (diabetic ketoacidoses) (HCC)  DKA +/- starvation ketoacidosis -Received 1.5 L of fluid, will give another 2 L IV bolus and then start maintenance IV fluid according to blood glucose level -Insulin drip -A1c -UDS, patient has history of marijuana abuse.  Decompensated combined metabolic acidosis and respiratory alkalosis -Treat DKA and reevaluate  AKI likely from dehydration -Hydration as above, and maintenance IV fluid and recheck kidney function  Leukocytosis -Suspect hemoconcentration as hemoglobin level also elevated, no symptoms or signs of active infection. -X-ray and UA pending  Elevated blood pressure without diagnosis of HTN -Hydralazine for now, may need BP meds to go home    DVT prophylaxis: Lovenox Code Status: Full Code Family Communication: None at bedside Disposition  Plan: Expect out of DKA tonight, once pt resume po intake, he can be discharged, so probably need 1-2 days' hospital stay Consults called: None Admission status: PCU   Emeline General MD [facility] Pager [phone]    04/27/2020, 9:55 AM

## 2020-04-27 NOTE — Progress Notes (Signed)
Transferred from ED to Room 6 N 32 via bed , Able to walk to hospital bed safely, denies pain at this time, oriented to room and staff, IV fluid continued. Meal was ordered and tolerated well. All belongings at bedside. Vital signs and assessment completed. No concerns verbalized at this time. Call bell within reach.

## 2020-04-27 NOTE — ED Provider Notes (Signed)
[facility] EMERGENCY DEPARTMENT Provider Note   CSN: [id number] Arrival date & time: 04/27/20  0539     History Chief Complaint  Patient presents with  . Emesis    Zachary Vazquez is a 34 y.o. male.  Level 5 caveat for active vomiting.  Patient is a type I diabetic.  Brought in by EMS with a 24-hour history of vomiting and diarrhea.  States "I am in DKA".  Blood sugar 301 for EMS.  States compliance with medications.  States unable to keep anything down for the past 24 hours multiple episodes of nausea and vomiting.  Emesis has been coffee-ground some brown streaks.  Multiple episodes of diarrhea as well.  Denies abdominal pain.  Denies fever.  Denies chest pain or shortness of breath.  Has had symptoms before with DKA.  Denies any illicit drug or alcohol use.  No previous abdominal surgeries.  The history is provided by the patient and the EMS personnel. The history is limited by the condition of the patient.       Past Medical History:  Diagnosis Date  . History of delayed wound healing 10/2019  . Rash and nonspecific skin eruption 10/2019  . Type 1 diabetes mellitus (HCC)   . Wound of lower extremity, left, sequela 10/2019    Patient Active Problem List   Diagnosis Date Noted  . History of delayed wound healing 04/16/2020  . Hemoglobin A1c less than 7.0% 04/16/2020  . Allergies 04/16/2020  . Rash and nonspecific skin eruption 11/23/2019  . Spider bite 11/18/2019  . Wound of left leg 11/18/2019  . Nausea & vomiting 05/04/2019  . DKA, type 1 (HCC) 05/04/2019  . DKA (diabetic ketoacidoses) (HCC) 05/04/2019  . Nausea and vomiting 12/02/2017  . Tobacco use disorder 03/30/2017  . Type 1 diabetes mellitus (HCC) 03/30/2017  . Leukocytosis 03/30/2017    Past Surgical History:  Procedure Laterality Date  . TONSILLECTOMY         Family History  Problem Relation Age of Onset  . Hypertension Mother   . Diabetes Mellitus I Brother     Social History     Tobacco Use  . Smoking status: Current Every Day Smoker    Packs/day: 0.25    Years: 15.00    Pack years: 3.75    Types: Cigarettes  . Smokeless tobacco: Never Used  Vaping Use  . Vaping Use: Never used  Substance Use Topics  . Alcohol use: Yes    Comment: occ  . Drug use: Not Currently    Types: Marijuana    Comment: occ    Home Medications Prior to Admission medications   Medication Sig Start Date End Date Taking? Authorizing Provider  cetirizine (ZYRTEC) 10 MG tablet Take 1 tablet (10 mg total) by mouth daily. 04/11/20   [name], FNP  diphenhydrAMINE (BENADRYL) 25 MG tablet Take 25 mg by mouth every 6 (six) hours as needed for itching or allergies. Patient not taking: Reported on 04/11/2020    [provider]  insulin aspart (NOVOLOG) 100 UNIT/ML injection INJECT 5 UNITS INTO THE SKIN WITH BREAKFAST, 7 UNITS WITH LUNCH, & 7 UNITS WITH DINNER. HOLD SHORT-ACTING INSULIN IF BLOOD SUGAR <125 12/20/19   [name], FNP  insulin glargine (LANTUS) 100 UNIT/ML injection Inject 0.3 mLs (30 Units total) into the skin at bedtime. 11/29/19   [name], FNP  triamcinolone cream (KENALOG) 0.1 % Apply 1 application topically 2 (two) times daily. 04/11/20   Bradly Chris,  Rolm Gala, FNP    Allergies    Clindamycin/lincomycin and Bactrim [sulfamethoxazole-trimethoprim]  Review of Systems   Review of Systems  Constitutional: Positive for activity change and appetite change. Negative for fatigue and fever.  HENT: Negative for congestion and rhinorrhea.   Respiratory: Negative for cough, chest tightness and shortness of breath.   Cardiovascular: Negative for chest pain.  Gastrointestinal: Positive for abdominal pain, diarrhea, nausea and vomiting.  Genitourinary: Negative for dysuria and hematuria.  Musculoskeletal: Positive for arthralgias.  Neurological: Negative for dizziness, weakness and headaches.   all other systems are negative except as noted in the HPI and  PMH.    Physical Exam Updated Vital Signs BP (!) 161/99   Pulse (!) 138   Temp 97.7 F (36.5 C) (Axillary)   Resp (!) 23   Ht 5\' 11"  (1.803 m)   Wt 90.7 kg   SpO2 99%   BMI 27.89 kg/m   Physical Exam Vitals and nursing note reviewed.  Constitutional:      General: He is in acute distress.     Appearance: He is well-developed. He is ill-appearing.     Comments: Dry heaving and vomiting.  Coffee-ground is in emesis bag  HENT:     Head: Normocephalic and atraumatic.     Mouth/Throat:     Mouth: Mucous membranes are moist.     Pharynx: No oropharyngeal exudate.  Eyes:     Conjunctiva/sclera: Conjunctivae normal.     Pupils: Pupils are equal, round, and reactive to light.  Neck:     Comments: No meningismus. Cardiovascular:     Rate and Rhythm: Regular rhythm. Tachycardia present.     Heart sounds: Normal heart sounds. No murmur heard.      Comments: Tachycardic 130s Pulmonary:     Effort: Pulmonary effort is normal. No respiratory distress.     Breath sounds: Normal breath sounds.  Abdominal:     Palpations: Abdomen is soft.     Tenderness: There is no abdominal tenderness. There is no guarding or rebound.  Musculoskeletal:        General: No tenderness. Normal range of motion.     Cervical back: Normal range of motion and neck supple.  Skin:    General: Skin is warm.  Neurological:     Mental Status: He is alert and oriented to person, place, and time.     Cranial Nerves: No cranial nerve deficit.     Motor: No abnormal muscle tone.     Coordination: Coordination normal.     Comments: No ataxia on finger to nose bilaterally. No pronator drift. 5/5 strength throughout. CN 2-12 intact.Equal grip strength. Sensation intact.   Psychiatric:        Behavior: Behavior normal.     ED Results / Procedures / Treatments   Labs (all labs ordered are listed, but only abnormal results are displayed) Labs Reviewed  CBC WITH DIFFERENTIAL/PLATELET - Abnormal; Notable for  the following components:      Result Value   WBC 18.9 (*)    RBC 5.89 (*)    Hemoglobin 18.2 (*)    HCT 54.7 (*)    Neutro Abs 16.0 (*)    Abs Immature Granulocytes 0.11 (*)    All other components within normal limits  COMPREHENSIVE METABOLIC PANEL - Abnormal; Notable for the following components:   Chloride 97 (*)    CO2 19 (*)    Glucose, Bld 296 (*)    Creatinine, Ser 1.41 (*)  Total Protein 8.2 (*)    Albumin 5.1 (*)    Total Bilirubin 1.8 (*)    Anion gap 21 (*)    All other components within normal limits  LACTIC ACID, PLASMA - Abnormal; Notable for the following components:   Lactic Acid, Venous 2.2 (*)    All other components within normal limits  BETA-HYDROXYBUTYRIC ACID - Abnormal; Notable for the following components:   Beta-Hydroxybutyric Acid 3.31 (*)    All other components within normal limits  CBG MONITORING, ED - Abnormal; Notable for the following components:   Glucose-Capillary 267 (*)    All other components within normal limits  I-STAT VENOUS BLOOD GAS, ED - Abnormal; Notable for the following components:   pH, Ven 7.541 (*)    pCO2, Ven 26.1 (*)    pO2, Ven 47.0 (*)    Potassium 5.2 (*)    Calcium, Ion 0.99 (*)    HCT 55.0 (*)    Hemoglobin 18.7 (*)    All other components within normal limits  I-STAT CHEM 8, ED - Abnormal; Notable for the following components:   Potassium 5.4 (*)    BUN 29 (*)    Glucose, Bld 309 (*)    Calcium, Ion 1.01 (*)    Hemoglobin 18.4 (*)    HCT 54.0 (*)    All other components within normal limits  LIPASE, BLOOD  LACTIC ACID, PLASMA  URINALYSIS, ROUTINE W REFLEX MICROSCOPIC  RAPID URINE DRUG SCREEN, HOSP PERFORMED    EKG None  Radiology No results found.  Procedures .Critical Care Performed by: Glynn Octaveancour, Cassity Christian, MD Authorized by: Glynn Octaveancour, Zahki Hoogendoorn, MD   Critical care provider statement:    Critical care time (minutes):  35   Critical care was necessary to treat or prevent imminent or life-threatening  deterioration of the following conditions:  Endocrine crisis   Critical care was time spent personally by me on the following activities:  Discussions with consultants, evaluation of patient's response to treatment, examination of patient, ordering and performing treatments and interventions, ordering and review of laboratory studies, ordering and review of radiographic studies, pulse oximetry, re-evaluation of patient's condition, obtaining history from patient or surrogate and review of old charts   (including critical care time)  Medications Ordered in ED Medications - No data to display  ED Course  I have reviewed the triage vital signs and the nursing notes.  Pertinent labs & imaging results that were available during my care of the patient were reviewed by me and considered in my medical decision making (see chart for details).    MDM Rules/Calculators/A&P                         Diabetic with 24-hour history of nausea vomiting with some blood streaks and coffee-ground.  He insists he is in DKA despite his blood sugar of 267.  He will be hydrated, given symptom control, labs will be obtained. He reports no history of gastroparesis.  no previous history of ulcers or EGD.  Labs show hemoconcentration. Bicarb 19, anion gap 19 but pH 7.5 Alkalosis likely secondary to hyperventilation.  Unclear that this represents DKA, but possible with mixed acid base picture.  Will continue hydration, give insulin, and plan to recheck electrolytes.  Will likely need admission for intractable nausea and vomiting, borderline DKA, and new coffee ground emesis.  Dr Dalene SeltzerSchlossman to assume care at shift change.  Final Clinical Impression(s) / ED Diagnoses Final diagnoses:  None  Rx / DC Orders ED Discharge Orders    None       Symphanie Cederberg, Jeannett Senior, MD 04/27/20 [phone]

## 2020-04-27 NOTE — ED Notes (Signed)
CBG on arrival 267.

## 2020-04-27 NOTE — Progress Notes (Signed)
Inpatient Diabetes Program Recommendations  AACE/ADA: New Consensus Statement on Inpatient Glycemic Control (2015)  Target Ranges:  Prepandial:   less than 140 mg/dL      Peak postprandial:   less than 180 mg/dL (1-2 hours)      Critically ill patients:  140 - 180 mg/dL   Lab Results  Component Value Date   GLUCAP 202 (H) 04/27/2020   HGBA1C 6.7 (H) 11/23/2019    Review of Glycemic Control  Diabetes history: Type 1 Outpatient Diabetes medications: Lantus 30 units every HS, Novolog 5 units at breakfast, 7 units at lunch, 7 units at supper. Hold if BS >125 mg/dl. Current orders for Inpatient glycemic control: IV insulin  Inpatient Diabetes Program Recommendations:   Admitted to ED with mild DKA. Noted consult for diabetes coordinator.   Recommend transitioning off IV insulin with: Lantus 30 units daily, Novolog SENSITIVE correction scale TID & HS scale, and Novolog 5 units TID with meals (when eating). Will continue to follow after admission to hospital.  Smith Mince RN BSN CDE Diabetes Coordinator Pager: [phone]  8am-5pm

## 2020-04-27 NOTE — ED Triage Notes (Signed)
Pt arrives via EMS complaining of nausea and vomiting for 24 hours. Pt states " I am in DKA". CBG with EMS was 301.

## 2020-04-27 NOTE — ED Notes (Signed)
Checked patient cbg it was 83 notified RN of blood sugar patient is resting with call bell in reach

## 2020-04-28 DIAGNOSIS — R112 Nausea with vomiting, unspecified: Secondary | ICD-10-CM

## 2020-04-28 LAB — BASIC METABOLIC PANEL
Anion gap: 9 (ref 5–15)
BUN: 11 mg/dL (ref 6–20)
CO2: 22 mmol/L (ref 22–32)
Calcium: 8.2 mg/dL — ABNORMAL LOW (ref 8.9–10.3)
Chloride: 109 mmol/L (ref 98–111)
Creatinine, Ser: 0.92 mg/dL (ref 0.61–1.24)
GFR calc Af Amer: >60 mL/min (ref >60)
GFR calc non Af Amer: >60 mL/min (ref >60)
Glucose, Bld: 73 mg/dL (ref 70–99)
Potassium: 4.2 mmol/L (ref 3.5–5.1)
Sodium: 140 mmol/L (ref 135–145)

## 2020-04-28 LAB — GLUCOSE, CAPILLARY
Glucose-Capillary: 84 mg/dL (ref 70–99)
Glucose-Capillary: 165 mg/dL — ABNORMAL HIGH (ref 70–99)
Glucose-Capillary: 142 mg/dL — ABNORMAL HIGH (ref 70–99)
Glucose-Capillary: 170 mg/dL — ABNORMAL HIGH (ref 70–99)

## 2020-04-28 MED ORDER — INSULIN GLARGINE 100 UNIT/ML ~~LOC~~ SOLN
15.0000 [IU] | Freq: Every day | SUBCUTANEOUS | Status: DC
  Administered 2020-04-28 – 2020-04-30 (×3): 15 [IU] via SUBCUTANEOUS
  Filled 2020-04-28 (×3): qty 0.15

## 2020-04-28 MED ORDER — SCOPOLAMINE 1 MG/3DAYS TD PT72
1.0000 | MEDICATED_PATCH | TRANSDERMAL | Status: DC
  Administered 2020-04-28: 1.5 mg via TRANSDERMAL
  Filled 2020-04-28: qty 1

## 2020-04-28 MED ORDER — PANTOPRAZOLE SODIUM 40 MG IV SOLR
40.0000 mg | INTRAVENOUS | Status: DC
  Administered 2020-04-28 – 2020-04-30 (×3): 40 mg via INTRAVENOUS
  Filled 2020-04-28 (×3): qty 40

## 2020-04-28 MED ORDER — INSULIN GLARGINE 100 UNIT/ML ~~LOC~~ SOLN: 15.0000 [IU] | Freq: Every day | SUBCUTANEOUS | Status: DC

## 2020-04-28 MED ORDER — PROMETHAZINE HCL 25 MG/ML IJ SOLN
25.0000 mg | Freq: Four times a day (QID) | INTRAMUSCULAR | Status: DC | PRN
  Administered 2020-04-28 – 2020-04-30 (×4): 25 mg via INTRAVENOUS
  Filled 2020-04-28 (×4): qty 1

## 2020-04-28 NOTE — Progress Notes (Signed)
Inpatient Diabetes Program Recommendations  AACE/ADA: New Consensus Statement on Inpatient Glycemic Control (2015)  Target Ranges:  Prepandial:   less than 140 mg/dL      Peak postprandial:   less than 180 mg/dL (1-2 hours)      Critically ill patients:  140 - 180 mg/dL   Lab Results  Component Value Date   GLUCAP 165 (H) 04/28/2020   HGBA1C 7.0 (H) 04/27/2020    Diabetes history:  DM1(does not make insulin.  Needs correction, basal and meal coverage)   Inpatient Diabetes Program Recommendations:    Please add Novolog 4 units tid with meals if eats at least 50% of meal and cbg is >80 mg/dl  Will continue to follow while inpatient.  Thank you, Dulce Sellar, RN, BSN Diabetes Coordinator Inpatient Diabetes Program [phone] (team pager from 8a-5p)

## 2020-04-28 NOTE — Progress Notes (Signed)
PROGRESS NOTE    Zachary Vazquez  WUX:[id number] DOB: 1986/09/08 DOA: 04/27/2020 PCP: [name], FNP    Brief Narrative: 34 year old male type 1 diabetes with history of DKA admitted with nausea and vomiting.  He denied fever chills abdominal pain diarrhea or urinary complaints.  He reports that he lost appetite few days prior to admission and has been trying to cut down on his insulin at home.  In the ER his glucose was above 300 with a gap of 21 bicarb 19.  UA positive for ketones.  Assessment & Plan:   Active Problems:   DKA (diabetic ketoacidoses) (HCC)   Hyperglycemia    #1 DKA-patient decreased the dose of insulin that he was taking at home by himself.  He was initially treated with insulin drip and Lantus was started.  However this morning he is unable to eat anything due to persistent nausea and vomiting Will give him half of Lantus that is being ordered.  Continue IV fluids.  Continue SSI. Zofran Phenergan and scopolamine patch for nausea and vomiting Hemoglobin A1c 7.0 UA positive ketones glucose Beta hydroxybutyrate 2.55    #2 AKI creatinine 0.92 down from 1.27 on admission with IV hydration.  #3 leukocytosis White count 18.9 on admission labs today pending.  No evidence of infection so far noted.  Chest x-ray not done will hold off since he has no symptoms.  #4 polysubstance abuse urine drug screen positive for THC  Estimated body mass index is 27.89 kg/m as calculated from the following:   Height as of this encounter: 5\' 11"  (1.803 m).   Weight as of this encounter: 90.7 kg.  DVT prophylaxis: Lovenox Code Status: Full code Family Communication: None at bedside  disposition Plan:  Status is: Inpatient  Dispo: The patient is from: Home              Anticipated d/c is to: Home              Anticipated d/c date is: 1 day              Patient currently is not medically stable to d/c.   Consultants: none   Procedures: None Antimicrobials:  None Subjective: Patient continues to feel nauseous with no appetite try to eat breakfast ended up having nausea and vomiting  Objective: Vitals:   04/27/20 1741 04/27/20 2038 04/28/20 0119 04/28/20 0454  BP: (!) 155/87 131/77 127/78 127/79  Pulse: 83 (!) 106 89 84  Resp: 20 18 18 16   Temp: 99 F (37.2 C) 99.1 F (37.3 C) 99.3 F (37.4 C) 98.7 F (37.1 C)  TempSrc: Oral Oral Oral Oral  SpO2: 100% 100% 98% 97%  Weight:      Height:        Intake/Output Summary (Last 24 hours) at 04/28/2020 1202 Last data filed at 04/28/2020 0948 Gross per 24 hour  Intake 4308.96 ml  Output --  Net 4308.96 ml   Filed Weights   04/27/20 0544  Weight: 90.7 kg    Examination:  General exam: Appears calm and comfortable  Respiratory system: Clear to auscultation. Respiratory effort normal. Cardiovascular system: S1 & S2 heard, RRR. No JVD, murmurs, rubs, gallops or clicks. No pedal edema. Gastrointestinal system: Abdomen is nondistended, soft and  Epigastric tender. No organomegaly or masses felt. Normal bowel sounds heard. Central nervous system: Alert and oriented. No focal neurological deficits. Extremities: Symmetric 5 x 5 power. Skin: No rashes, lesions or ulcers Psychiatry: Judgement and insight appear normal.  Mood & affect appropriate.     Data Reviewed: I have personally reviewed following labs and imaging studies  CBC: Recent Labs  Lab 04/27/20 0554 04/27/20 0619  WBC 18.9*  --   NEUTROABS 16.0*  --   HGB 18.2* 18.7*  18.4*  HCT 54.7* 55.0*  54.0*  MCV 92.9  --   PLT PLATELET CLUMPS NOTED ON SMEAR, COUNT APPEARS ADEQUATE  --    Basic Metabolic Panel: Recent Labs  Lab 04/27/20 0554 04/27/20 0619 04/27/20 1111 04/28/20 0336  NA 137 135  135 139 140  K 4.1 5.2*  5.4* 3.8 4.2  CL 97* 102 106 109  CO2 19*  --  21* 22  GLUCOSE 296* 309* 209* 73  BUN 20 29* 19 11  CREATININE 1.41* 1.10 1.27* 0.92  CALCIUM 10.2  --  8.5* 8.2*   GFR: Estimated Creatinine  Clearance: 131.7 mL/min (by C-G formula based on SCr of 0.92 mg/dL). Liver Function Tests: Recent Labs  Lab 04/27/20 0554  AST 23  ALT 21  ALKPHOS 72  BILITOT 1.8*  PROT 8.2*  ALBUMIN 5.1*   Recent Labs  Lab 04/27/20 0554  LIPASE 18   No results for input(s): AMMONIA in the last 168 hours. Coagulation Profile: No results for input(s): INR, PROTIME in the last 168 hours. Cardiac Enzymes: No results for input(s): CKTOTAL, CKMB, CKMBINDEX, TROPONINI in the last 168 hours. BNP (last 3 results) No results for input(s): PROBNP in the last 8760 hours. HbA1C: Recent Labs    04/27/20 1111  HGBA1C 7.0*   CBG: Recent Labs  Lab 04/27/20 1113 04/27/20 1220 04/27/20 1648 04/27/20 2041 04/28/20 0800  GLUCAP 186* 152* 96 83 84   Lipid Profile: No results for input(s): CHOL, HDL, LDLCALC, TRIG, CHOLHDL, LDLDIRECT in the last 72 hours. Thyroid Function Tests: No results for input(s): TSH, T4TOTAL, FREET4, T3FREE, THYROIDAB in the last 72 hours. Anemia Panel: No results for input(s): VITAMINB12, FOLATE, FERRITIN, TIBC, IRON, RETICCTPCT in the last 72 hours. Sepsis Labs: Recent Labs  Lab 04/27/20 1950 04/27/20 0842  LATICACIDVEN 2.2* 2.4*    Recent Results (from the past 240 hour(s))  SARS Coronavirus 2 by RT PCR (hospital order, performed in [facility] hospital lab) Nasopharyngeal Nasopharyngeal Swab     Status: None   Collection Time: 04/27/20  4:20 PM   Specimen: Nasopharyngeal Swab  Result Value Ref Range Status   SARS Coronavirus 2 NEGATIVE NEGATIVE Final    Comment: (NOTE) SARS-CoV-2 target nucleic acids are NOT DETECTED.  The SARS-CoV-2 RNA is generally detectable in upper and lower respiratory specimens during the acute phase of infection. The lowest concentration of SARS-CoV-2 viral copies this assay can detect is 250 copies / mL. A negative result does not preclude SARS-CoV-2 infection and should not be used as the sole basis for treatment or other patient  management decisions.  A negative result may occur with improper specimen collection / handling, submission of specimen other than nasopharyngeal swab, presence of viral mutation(s) within the areas targeted by this assay, and inadequate number of viral copies (<250 copies / mL). A negative result must be combined with clinical observations, patient history, and epidemiological information.  Fact Sheet for Patients:   BoilerBrush.com.cy  Fact Sheet for Healthcare Providers: https://pope.com/  This test is not yet approved or  cleared by the [place] FDA and has been authorized for detection and/or diagnosis of SARS-CoV-2 by FDA under an Emergency Use Authorization (EUA).  This EUA will remain in effect (meaning  this test can be used) for the duration of the COVID-19 declaration under Section 564(b)(1) of the Act, 21 U.S.C. section 360bbb-3(b)(1), unless the authorization is terminated or revoked sooner.  Performed at [facility], [address], [place], [place] [address]          Radiology Studies: No results found.      Scheduled Meds: . enoxaparin (LOVENOX) injection  40 mg Subcutaneous Q24H  . insulin aspart  0-15 Units Subcutaneous TID WC  . insulin aspart  0-5 Units Subcutaneous QHS  . [START ON 04/29/2020] insulin glargine  15 Units Subcutaneous Daily   Continuous Infusions: . sodium chloride 150 mL/hr at 04/28/20 0518     LOS: 1 day      Alwyn Ren, MD  04/28/2020, 12:02 PM

## 2020-04-28 NOTE — Progress Notes (Signed)
Inpatient Diabetes Program Recommendations  AACE/ADA: New Consensus Statement on Inpatient Glycemic Control (2015)  Target Ranges:  Prepandial:   less than 140 mg/dL      Peak postprandial:   less than 180 mg/dL (1-2 hours)      Critically ill patients:  140 - 180 mg/dL   Lab Results  Component Value Date   GLUCAP 165 (H) 04/28/2020   HGBA1C 7.0 (H) 04/27/2020    Note:  Attempted to speak with patient about DM regimen.  He is actively vomiting.  Notified unit clerk to alert RN.  Will attempt at a later time.  Will continue to follow while inpatient.  Thank you, Dulce Sellar, RN, BSN Diabetes Coordinator Inpatient Diabetes Program [phone] (team pager from 8a-5p)

## 2020-04-29 ENCOUNTER — Inpatient Hospital Stay (HOSPITAL_COMMUNITY): Payer: Self-pay

## 2020-04-29 DIAGNOSIS — R110 Nausea: Secondary | ICD-10-CM

## 2020-04-29 LAB — CBC
HCT: 45.2 % (ref 39.0–52.0)
Hemoglobin: 15.4 g/dL (ref 13.0–17.0)
MCH: 31.1 pg (ref 26.0–34.0)
MCHC: 34.1 g/dL (ref 30.0–36.0)
MCV: 91.3 fL (ref 80.0–100.0)
Platelets: 271 10*3/uL (ref 150–400)
RBC: 4.95 MIL/uL (ref 4.22–5.81)
RDW: 11.9 % (ref 11.5–15.5)
WBC: 13.4 10*3/uL — ABNORMAL HIGH (ref 4.0–10.5)
nRBC: 0 % (ref 0.0–0.2)

## 2020-04-29 LAB — BASIC METABOLIC PANEL
Anion gap: 13 (ref 5–15)
BUN: 15 mg/dL (ref 6–20)
CO2: 21 mmol/L — ABNORMAL LOW (ref 22–32)
Calcium: 8.7 mg/dL — ABNORMAL LOW (ref 8.9–10.3)
Chloride: 105 mmol/L (ref 98–111)
Creatinine, Ser: 1.31 mg/dL — ABNORMAL HIGH (ref 0.61–1.24)
GFR calc Af Amer: >60 mL/min (ref >60)
GFR calc non Af Amer: >60 mL/min (ref >60)
Glucose, Bld: 208 mg/dL — ABNORMAL HIGH (ref 70–99)
Potassium: 3.9 mmol/L (ref 3.5–5.1)
Sodium: 139 mmol/L (ref 135–145)

## 2020-04-29 LAB — COMPREHENSIVE METABOLIC PANEL
ALT: 18 U/L (ref 0–44)
AST: 16 U/L (ref 15–41)
Albumin: 3.8 g/dL (ref 3.5–5.0)
Alkaline Phosphatase: 56 U/L (ref 38–126)
Anion gap: 16 — ABNORMAL HIGH (ref 5–15)
BUN: 7 mg/dL (ref 6–20)
CO2: 22 mmol/L (ref 22–32)
Calcium: 9 mg/dL (ref 8.9–10.3)
Chloride: 100 mmol/L (ref 98–111)
Creatinine, Ser: 1.17 mg/dL (ref 0.61–1.24)
GFR calc Af Amer: >60 mL/min (ref >60)
GFR calc non Af Amer: >60 mL/min (ref >60)
Glucose, Bld: 269 mg/dL — ABNORMAL HIGH (ref 70–99)
Potassium: 3.6 mmol/L (ref 3.5–5.1)
Sodium: 138 mmol/L (ref 135–145)
Total Bilirubin: 1.7 mg/dL — ABNORMAL HIGH (ref 0.3–1.2)
Total Protein: 6.5 g/dL (ref 6.5–8.1)

## 2020-04-29 LAB — GLUCOSE, CAPILLARY
Glucose-Capillary: 307 mg/dL — ABNORMAL HIGH (ref 70–99)
Glucose-Capillary: 315 mg/dL — ABNORMAL HIGH (ref 70–99)
Glucose-Capillary: 204 mg/dL — ABNORMAL HIGH (ref 70–99)
Glucose-Capillary: 192 mg/dL — ABNORMAL HIGH (ref 70–99)
Glucose-Capillary: 143 mg/dL — ABNORMAL HIGH (ref 70–99)

## 2020-04-29 LAB — LIPASE, BLOOD: Lipase: 16 U/L (ref 11–51)

## 2020-04-29 MED ORDER — ONDANSETRON HCL 4 MG/2ML IJ SOLN
4.0000 mg | Freq: Four times a day (QID) | INTRAMUSCULAR | Status: DC
  Administered 2020-04-29 – 2020-05-01 (×9): 4 mg via INTRAVENOUS
  Filled 2020-04-29 (×9): qty 2

## 2020-04-29 MED ORDER — SODIUM CHLORIDE 0.9 % IV SOLN: INTRAVENOUS | Status: DC

## 2020-04-29 MED ORDER — LORAZEPAM 2 MG/ML IJ SOLN
1.0000 mg | Freq: Once | INTRAMUSCULAR | Status: AC
  Administered 2020-04-29: 1 mg via INTRAVENOUS
  Filled 2020-04-29: qty 1

## 2020-04-29 NOTE — Plan of Care (Signed)
  Problem: Clinical Measurements: Goal: Ability to maintain clinical measurements within normal limits will improve Outcome: Not Progressing   Problem: Nutrition: Goal: Adequate nutrition will be maintained Outcome: Not Progressing   

## 2020-04-29 NOTE — Progress Notes (Signed)
PROGRESS NOTE    Zachary Vazquez  HQP:[id number] DOB: [birth year] DOA: 04/27/2020 PCP: [name], FNP    Brief Narrative: 34 year old male type 1 diabetes with history of DKA admitted with nausea and vomiting.  He denied fever chills abdominal pain diarrhea or urinary complaints.  He reports that he lost appetite few days prior to admission and has been trying to cut down on his insulin at home.  In the ER his glucose was above 300 with a gap of 21 bicarb 19.  UA positive for ketones.  Assessment & Plan:   Active Problems:   Intractable nausea and vomiting   DKA (diabetic ketoacidoses) (HCC)   Hyperglycemia    #1 DKA-patient still not able to tolerate p.o. intake continues with nausea vomiting overnight.   Continue IV fluids.   Blood sugar still above 2 50-300 continue Lantus and sliding scale insulin.   Zofran Phenergan and scopolamine patch for nausea and vomiting Hemoglobin A1c 7.0 UA positive ketones glucose Beta hydroxybutyrate 2.55 KUB negative.  Labs earlier today sodium 138 potassium 3.6 BUN 7 creatinine 1.17 glucose 269 anion gap is 16.  Recheck labs now.    ?  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  Continue symptomatic treatment. #2 AKI creatinine 1.17 from 0.92 down from 1.27 .  Continue IV fluids increase rate.  #3 leukocytosis White count 13.4 from 18.9 on admission.  No evidence of infection so far noted.  Chest x-ray not done will hold off since he has no symptoms.  #4 polysubstance abuse urine drug screen positive for THC  Estimated body mass index is 27.89 kg/m as calculated from the following:   Height as of this encounter: 5\' 11"  (1.803 m).   Weight as of this encounter: 90.7 kg.  DVT prophylaxis: Lovenox Code Status: Full code Family Communication: None at bedside  disposition Plan:  Status is: Inpatient  Dispo: The patient is from: Home              Anticipated d/c is to: Home              Anticipated d/c date is: 1  day              Patient currently is not medically stable to d/c.   Consultants: none   Procedures: None Antimicrobials: None Subjective: He continues with nausea vomiting no appetite.    Objective: Vitals:   04/28/20 0454 04/28/20 1340 04/28/20 2040 04/29/20 0619  BP: 127/79 (!) 142/92 (!) 158/81 (!) 163/91  Pulse: 84 77 93 (!) 105  Resp: 16 16 17 18   Temp: 98.7 F (37.1 C) 99.4 F (37.4 C) 99.6 F (37.6 C) 98.5 F (36.9 C)  TempSrc: Oral Oral Oral Oral  SpO2: 97% 97% 98% 99%  Weight:      Height:        Intake/Output Summary (Last 24 hours) at 04/29/2020 1344 Last data filed at 04/29/2020 1028 Gross per 24 hour  Intake 1218.84 ml  Output 750 ml  Net 468.84 ml   Filed Weights   04/27/20 0544  Weight: 90.7 kg    Examination:  General exam: Appears calm and comfortable  Respiratory system: Clear to auscultation. Respiratory effort normal. Cardiovascular system: S1 & S2 heard, RRR. No JVD, murmurs, rubs, gallops or clicks. No pedal edema. Gastrointestinal system: Abdomen is nondistended, soft and  Epigastric tender. No organomegaly or masses felt. Normal bowel sounds heard. Central nervous system: Alert and oriented. No focal neurological deficits. Extremities:  Symmetric 5 x 5 power. Skin: No rashes, lesions or ulcers Psychiatry: Judgement and insight appear normal. Mood & affect appropriate.     Data Reviewed: I have personally reviewed following labs and imaging studies  CBC: Recent Labs  Lab 04/27/20 0554 04/27/20 0619 04/29/20 0243  WBC 18.9*  --  13.4*  NEUTROABS 16.0*  --   --   HGB 18.2* 18.7*  18.4* 15.4  HCT 54.7* 55.0*  54.0* 45.2  MCV 92.9  --  91.3  PLT PLATELET CLUMPS NOTED ON SMEAR, COUNT APPEARS ADEQUATE  --  271   Basic Metabolic Panel: Recent Labs  Lab 04/27/20 0554 04/27/20 0619 04/27/20 1111 04/28/20 0336 04/29/20 0243  NA 137 135  135 139 140 138  K 4.1 5.2*  5.4* 3.8 4.2 3.6  CL 97* 102 106 109 100  CO2 19*  --  21* 22  22  GLUCOSE 296* 309* 209* 73 269*  BUN 20 29* 19 11 7   CREATININE 1.41* 1.10 1.27* 0.92 1.17  CALCIUM 10.2  --  8.5* 8.2* 9.0   GFR: Estimated Creatinine Clearance: 103.5 mL/min (by C-G formula based on SCr of 1.17 mg/dL). Liver Function Tests: Recent Labs  Lab 04/27/20 0554 04/29/20 0243  AST 23 16  ALT 21 18  ALKPHOS 72 56  BILITOT 1.8* 1.7*  PROT 8.2* 6.5  ALBUMIN 5.1* 3.8   Recent Labs  Lab 04/27/20 0554 04/29/20 0243  LIPASE 18 16   No results for input(s): AMMONIA in the last 168 hours. Coagulation Profile: No results for input(s): INR, PROTIME in the last 168 hours. Cardiac Enzymes: No results for input(s): CKTOTAL, CKMB, CKMBINDEX, TROPONINI in the last 168 hours. BNP (last 3 results) No results for input(s): PROBNP in the last 8760 hours. HbA1C: Recent Labs    04/27/20 1111  HGBA1C 7.0*   CBG: Recent Labs  Lab 04/28/20 1650 04/28/20 2100 04/29/20 0614 04/29/20 0835 04/29/20 1207  GLUCAP 142* 170* 307* 315* 204*   Lipid Profile: No results for input(s): CHOL, HDL, LDLCALC, TRIG, CHOLHDL, LDLDIRECT in the last 72 hours. Thyroid Function Tests: No results for input(s): TSH, T4TOTAL, FREET4, T3FREE, THYROIDAB in the last 72 hours. Anemia Panel: No results for input(s): VITAMINB12, FOLATE, FERRITIN, TIBC, IRON, RETICCTPCT in the last 72 hours. Sepsis Labs: Recent Labs  Lab 04/27/20 06/28/20 04/27/20 0842  LATICACIDVEN 2.2* 2.4*    Recent Results (from the past 240 hour(s))  SARS Coronavirus 2 by RT PCR (hospital order, performed in [facility] hospital lab) Nasopharyngeal Nasopharyngeal Swab     Status: None   Collection Time: 04/27/20  4:20 PM   Specimen: Nasopharyngeal Swab  Result Value Ref Range Status   SARS Coronavirus 2 NEGATIVE NEGATIVE Final    Comment: (NOTE) SARS-CoV-2 target nucleic acids are NOT DETECTED.  The SARS-CoV-2 RNA is generally detectable in upper and lower respiratory specimens during the acute phase of infection. The  lowest concentration of SARS-CoV-2 viral copies this assay can detect is 250 copies / mL. A negative result does not preclude SARS-CoV-2 infection and should not be used as the sole basis for treatment or other patient management decisions.  A negative result may occur with improper specimen collection / handling, submission of specimen other than nasopharyngeal swab, presence of viral mutation(s) within the areas targeted by this assay, and inadequate number of viral copies (<250 copies / mL). A negative result must be combined with clinical observations, patient history, and epidemiological information.  Fact Sheet for Patients:   06/28/20  Fact Sheet for Healthcare Providers: https://pope.com/  This test is not yet approved or  cleared by the [place] FDA and has been authorized for detection and/or diagnosis of SARS-CoV-2 by FDA under an Emergency Use Authorization (EUA).  This EUA will remain in effect (meaning this test can be used) for the duration of the COVID-19 declaration under Section 564(b)(1) of the Act, 21 U.S.C. section 360bbb-3(b)(1), unless the authorization is terminated or revoked sooner.  Performed at [facility], [address], [place], [place] [address]          Radiology Studies: DG Abd 1 View  Result Date: 04/29/2020 CLINICAL DATA:  Nausea and vomiting EXAM: ABDOMEN - 1 VIEW COMPARISON:  Abdominal radiograph dated 05/03/2019. FINDINGS: The bowel gas pattern is normal. No radio-opaque calculi or other significant radiographic abnormality are seen. IMPRESSION: Negative. Electronically Signed   By: Romona Curls M.D.   On: 04/29/2020 09:51        Scheduled Meds: . enoxaparin (LOVENOX) injection  40 mg Subcutaneous Q24H  . insulin aspart  0-15 Units Subcutaneous TID WC  . insulin aspart  0-5 Units Subcutaneous QHS  . insulin glargine  15 Units Subcutaneous Daily  . ondansetron  (ZOFRAN) IV  4 mg Intravenous QID  . pantoprazole (PROTONIX) IV  40 mg Intravenous Q24H  . scopolamine  1 patch Transdermal Q72H   Continuous Infusions: . sodium chloride 150 mL/hr at 04/29/20 0841     LOS: 2 days      Alwyn Ren, MD  04/29/2020, 1:44 PM

## 2020-04-29 NOTE — Plan of Care (Signed)
  Problem: Education: Goal: Knowledge of General Education information will improve Description: Including pain rating scale, medication(s)/side effects and non-pharmacologic comfort measures Outcome: Progressing   Problem: Health Behavior/Discharge Planning: Goal: Ability to manage health-related needs will improve Outcome: Progressing   Problem: Clinical Measurements: Goal: Ability to maintain clinical measurements within normal limits will improve Outcome: Progressing Goal: Will remain free from infection Outcome: Progressing Goal: Diagnostic test results will improve Outcome: Progressing Goal: Respiratory complications will improve Outcome: Progressing   Problem: Nutrition: Goal: Adequate nutrition will be maintained Outcome: Progressing   Problem: Elimination: Goal: Will not experience complications related to urinary retention Outcome: Progressing   Problem: Pain Managment: Goal: General experience of comfort will improve Outcome: Progressing   Problem: Safety: Goal: Ability to remain free from injury will improve Outcome: Progressing   Problem: Skin Integrity: Goal: Risk for impaired skin integrity will decrease Outcome: Progressing

## 2020-04-30 LAB — GLUCOSE, CAPILLARY
Glucose-Capillary: 263 mg/dL — ABNORMAL HIGH (ref 70–99)
Glucose-Capillary: 172 mg/dL — ABNORMAL HIGH (ref 70–99)
Glucose-Capillary: 167 mg/dL — ABNORMAL HIGH (ref 70–99)
Glucose-Capillary: 154 mg/dL — ABNORMAL HIGH (ref 70–99)

## 2020-04-30 LAB — BASIC METABOLIC PANEL
Anion gap: 14 (ref 5–15)
BUN: 10 mg/dL (ref 6–20)
CO2: 24 mmol/L (ref 22–32)
Calcium: 8.4 mg/dL — ABNORMAL LOW (ref 8.9–10.3)
Chloride: 100 mmol/L (ref 98–111)
Creatinine, Ser: 1.05 mg/dL (ref 0.61–1.24)
GFR calc Af Amer: >60 mL/min (ref >60)
GFR calc non Af Amer: >60 mL/min (ref >60)
Glucose, Bld: 270 mg/dL — ABNORMAL HIGH (ref 70–99)
Potassium: 3.4 mmol/L — ABNORMAL LOW (ref 3.5–5.1)
Sodium: 138 mmol/L (ref 135–145)

## 2020-04-30 LAB — CBC
HCT: 45.2 % (ref 39.0–52.0)
Hemoglobin: 15 g/dL (ref 13.0–17.0)
MCH: 30.3 pg (ref 26.0–34.0)
MCHC: 33.2 g/dL (ref 30.0–36.0)
MCV: 91.3 fL (ref 80.0–100.0)
Platelets: 217 10*3/uL (ref 150–400)
RBC: 4.95 MIL/uL (ref 4.22–5.81)
RDW: 11.9 % (ref 11.5–15.5)
WBC: 8.1 10*3/uL (ref 4.0–10.5)
nRBC: 0 % (ref 0.0–0.2)

## 2020-04-30 LAB — MAGNESIUM: Magnesium: 1.5 mg/dL — ABNORMAL LOW (ref 1.7–2.4)

## 2020-04-30 MED ORDER — BISACODYL 10 MG RE SUPP
10.0000 mg | Freq: Once | RECTAL | Status: AC
  Administered 2020-04-30: 10 mg via RECTAL
  Filled 2020-04-30: qty 1

## 2020-04-30 MED ORDER — SORBITOL 70 % SOLN
960.0000 mL | TOPICAL_OIL | Freq: Once | ORAL | Status: AC
  Administered 2020-04-30: 960 mL via RECTAL
  Filled 2020-04-30 (×2): qty 473

## 2020-04-30 MED ORDER — INSULIN GLARGINE 100 UNIT/ML ~~LOC~~ SOLN
24.0000 [IU] | Freq: Every day | SUBCUTANEOUS | Status: DC
  Administered 2020-05-01: 24 [IU] via SUBCUTANEOUS
  Filled 2020-04-30: qty 0.24

## 2020-04-30 MED ORDER — METOCLOPRAMIDE HCL 5 MG/ML IJ SOLN
10.0000 mg | Freq: Four times a day (QID) | INTRAMUSCULAR | Status: DC | PRN
  Administered 2020-04-30: 10 mg via INTRAVENOUS
  Filled 2020-04-30: qty 2

## 2020-04-30 MED ORDER — PANTOPRAZOLE SODIUM 40 MG IV SOLR
40.0000 mg | Freq: Two times a day (BID) | INTRAVENOUS | Status: DC
  Administered 2020-04-30 – 2020-05-01 (×2): 40 mg via INTRAVENOUS
  Filled 2020-04-30 (×2): qty 40

## 2020-04-30 MED ORDER — POTASSIUM CHLORIDE 10 MEQ/100ML IV SOLN
10.0000 meq | INTRAVENOUS | Status: AC
  Administered 2020-04-30 (×3): 10 meq via INTRAVENOUS
  Filled 2020-04-30 (×3): qty 100

## 2020-04-30 NOTE — Progress Notes (Signed)
PROGRESS NOTE    Zachary Vazquez  DQQ:[id number] DOB: 07/04/1986 DOA: 04/27/2020 PCP: [name], FNP    Brief Narrative: 34 year old male type 1 diabetes with history of DKA admitted with nausea and vomiting.  He denied fever chills abdominal pain diarrhea or urinary complaints.  He reports that he lost appetite few days prior to admission and has been trying to cut down on his insulin at home.  In the ER his glucose was above 300 with a gap of 21 bicarb 19.  UA positive for ketones.  Assessment & Plan:   Active Problems:   Nausea   Intractable nausea and vomiting   DKA (diabetic ketoacidoses) (HCC)   Hyperglycemia    #1 DKA-patient decreased the dose of insulin that he was taking at home by himself.  He was initially treated with insulin drip and Lantus was started.  However this morning he is unable to eat anything due to persistent nausea and vomiting. Continue IV fluids.  Increase Lantus to 24 units nightly. Add Reglan. Zofran Phenergan and scopolamine patch for nausea and vomiting Hemoglobin A1c 7.0 UA positive ketones glucose Beta hydroxybutyrate 2.55    #2 AKI secondary to dehydration nausea and vomiting.  Creatinine improving continue IV fluids.    #3 leukocytosis-White count 8.1 normalized without antibiotics likely secondary to DKA.  No evidence of infection noted.   #4 polysubstance abuse urine drug screen positive for THC  #5 persistent nausea and vomiting could be multifactorial marijuana hyperemesis syndrome versus diabetic gastroparesis versus gastritis in the setting of constipation-treat symptomatically.  Stool softeners ordered.  Estimated body mass index is 27.89 kg/m as calculated from the following:   Height as of this encounter: 5\' 11"  (1.803 m).   Weight as of this encounter: 90.7 kg.  DVT prophylaxis: Lovenox Code Status: Full code Family Communication: None at bedside  disposition Plan:  Status is: Inpatient  Dispo: The patient is from:  Home              Anticipated d/c is to: Home              Anticipated d/c date is: 1 day              Patient currently is not medically stable to d/c.  Patient continues with active ongoing nausea and vomiting with IV hydration in place.   Consultants: none   Procedures: None Antimicrobials: None Subjective: Patient continues to feel nauseous with no appetite try to eat breakfast ended up having nausea and vomiting  Objective: Vitals:   04/29/20 1425 04/29/20 1747 04/29/20 2007 04/30/20 0507  BP: 131/83 (!) 154/53 (!) 148/94 (!) 149/99  Pulse: (!) 101 79 89 68  Resp: 18 15 16 16   Temp: 98.7 F (37.1 C) 98.6 F (37 C) 98.6 F (37 C) 98.4 F (36.9 C)  TempSrc: Oral Axillary Oral Oral  SpO2: 98% 98% 98% 97%  Weight:      Height:        Intake/Output Summary (Last 24 hours) at 04/30/2020 1327 Last data filed at 04/30/2020 1031 Gross per 24 hour  Intake 1395.32 ml  Output 775 ml  Net 620.32 ml   Filed Weights   04/27/20 0544  Weight: 90.7 kg    Examination:  General exam: Appears calm and comfortable  Respiratory system: Clear to auscultation. Respiratory effort normal. Cardiovascular system: S1 & S2 heard, RRR. No JVD, murmurs, rubs, gallops or clicks. No pedal edema. Gastrointestinal system: Abdomen is distended, soft and  Epigastric tender. No organomegaly or masses felt. Normal bowel sounds heard.  Epigastric tenderness Central nervous system: Alert and oriented. No focal neurological deficits. Extremities: Symmetric 5 x 5 power. Skin: No rashes, lesions or ulcers Psychiatry: Judgement and insight appear normal. Mood & affect appropriate.     Data Reviewed: I have personally reviewed following labs and imaging studies  CBC: Recent Labs  Lab 04/27/20 0554 04/27/20 0619 04/29/20 0243 04/30/20 0836  WBC 18.9*  --  13.4* 8.1  NEUTROABS 16.0*  --   --   --   HGB 18.2* 18.7*  18.4* 15.4 15.0  HCT 54.7* 55.0*  54.0* 45.2 45.2  MCV 92.9  --  91.3 91.3  PLT  PLATELET CLUMPS NOTED ON SMEAR, COUNT APPEARS ADEQUATE  --  271 217   Basic Metabolic Panel: Recent Labs  Lab 04/27/20 1111 04/28/20 0336 04/29/20 0243 04/29/20 1501 04/30/20 0836  NA 139 140 138 139 138  K 3.8 4.2 3.6 3.9 3.4*  CL 106 109 100 105 100  CO2 21* 22 22 21* 24  GLUCOSE 209* 73 269* 208* 270*  BUN 19 11 7 15 10   CREATININE 1.27* 0.92 1.17 1.31* 1.05  CALCIUM 8.5* 8.2* 9.0 8.7* 8.4*   GFR: Estimated Creatinine Clearance: 115.4 mL/min (by C-G formula based on SCr of 1.05 mg/dL). Liver Function Tests: Recent Labs  Lab 04/27/20 0554 04/29/20 0243  AST 23 16  ALT 21 18  ALKPHOS 72 56  BILITOT 1.8* 1.7*  PROT 8.2* 6.5  ALBUMIN 5.1* 3.8   Recent Labs  Lab 04/27/20 0554 04/29/20 0243  LIPASE 18 16   No results for input(s): AMMONIA in the last 168 hours. Coagulation Profile: No results for input(s): INR, PROTIME in the last 168 hours. Cardiac Enzymes: No results for input(s): CKTOTAL, CKMB, CKMBINDEX, TROPONINI in the last 168 hours. BNP (last 3 results) No results for input(s): PROBNP in the last 8760 hours. HbA1C: No results for input(s): HGBA1C in the last 72 hours. CBG: Recent Labs  Lab 04/29/20 1207 04/29/20 1736 04/29/20 2012 04/30/20 0817 04/30/20 1228  GLUCAP 204* 192* 143* 263* 172*   Lipid Profile: No results for input(s): CHOL, HDL, LDLCALC, TRIG, CHOLHDL, LDLDIRECT in the last 72 hours. Thyroid Function Tests: No results for input(s): TSH, T4TOTAL, FREET4, T3FREE, THYROIDAB in the last 72 hours. Anemia Panel: No results for input(s): VITAMINB12, FOLATE, FERRITIN, TIBC, IRON, RETICCTPCT in the last 72 hours. Sepsis Labs: Recent Labs  Lab 04/27/20 06/28/20 04/27/20 0842  LATICACIDVEN 2.2* 2.4*    Recent Results (from the past 240 hour(s))  SARS Coronavirus 2 by RT PCR (hospital order, performed in [facility] hospital lab) Nasopharyngeal Nasopharyngeal Swab     Status: None   Collection Time: 04/27/20  4:20 PM   Specimen:  Nasopharyngeal Swab  Result Value Ref Range Status   SARS Coronavirus 2 NEGATIVE NEGATIVE Final    Comment: (NOTE) SARS-CoV-2 target nucleic acids are NOT DETECTED.  The SARS-CoV-2 RNA is generally detectable in upper and lower respiratory specimens during the acute phase of infection. The lowest concentration of SARS-CoV-2 viral copies this assay can detect is 250 copies / mL. A negative result does not preclude SARS-CoV-2 infection and should not be used as the sole basis for treatment or other patient management decisions.  A negative result may occur with improper specimen collection / handling, submission of specimen other than nasopharyngeal swab, presence of viral mutation(s) within the areas targeted by this assay, and inadequate number of viral copies (<250  copies / mL). A negative result must be combined with clinical observations, patient history, and epidemiological information.  Fact Sheet for Patients:   BoilerBrush.com.cy  Fact Sheet for Healthcare Providers: https://pope.com/  This test is not yet approved or  cleared by the [place] FDA and has been authorized for detection and/or diagnosis of SARS-CoV-2 by FDA under an Emergency Use Authorization (EUA).  This EUA will remain in effect (meaning this test can be used) for the duration of the COVID-19 declaration under Section 564(b)(1) of the Act, 21 U.S.C. section 360bbb-3(b)(1), unless the authorization is terminated or revoked sooner.  Performed at [facility], [address] [place], [place] [address]          Radiology Studies: DG Abd 1 View  Result Date: 04/29/2020 CLINICAL DATA:  Nausea and vomiting EXAM: ABDOMEN - 1 VIEW COMPARISON:  Abdominal radiograph dated 05/03/2019. FINDINGS: The bowel gas pattern is normal. No radio-opaque calculi or other significant radiographic abnormality are seen. IMPRESSION: Negative. Electronically Signed   By:  Romona Curls M.D.   On: 04/29/2020 09:51        Scheduled Meds: . bisacodyl  10 mg Rectal Once  . enoxaparin (LOVENOX) injection  40 mg Subcutaneous Q24H  . insulin aspart  0-15 Units Subcutaneous TID WC  . insulin aspart  0-5 Units Subcutaneous QHS  . insulin glargine  15 Units Subcutaneous Daily  . ondansetron (ZOFRAN) IV  4 mg Intravenous QID  . pantoprazole (PROTONIX) IV  40 mg Intravenous Q24H  . scopolamine  1 patch Transdermal Q72H  . sorbitol, milk of mag, mineral oil, glycerin (SMOG) enema  960 mL Rectal Once   Continuous Infusions: . sodium chloride 150 mL/hr at 04/30/20 1312     LOS: 3 days      Alwyn Ren, MD  04/30/2020, 1:27 PM

## 2020-04-30 NOTE — Progress Notes (Signed)
Inpatient Diabetes Program Recommendations  AACE/ADA: New Consensus Statement on Inpatient Glycemic Control (2015)  Target Ranges:  Prepandial:   less than 140 mg/dL      Peak postprandial:   less than 180 mg/dL (1-2 hours)      Critically ill patients:  140 - 180 mg/dL   Lab Results  Component Value Date   GLUCAP 263 (H) 04/30/2020   HGBA1C 7.0 (H) 04/27/2020    Review of Glycemic Control Results for Zachary Vazquez, Zachary Vazquez (MRN [id number]) as of 04/30/2020 11:43  Ref. Range 04/29/2020 08:35 04/29/2020 12:07 04/29/2020 17:36 04/29/2020 20:12 04/30/2020 08:17  Glucose-Capillary Latest Ref Range: 70 - 99 mg/dL 225 (H) 750 (H) 518 (H) 143 (H) 263 (H)   Diabetes history:  Type 1  Outpatient Diabetes medications:  Lantus 30 units every HS, Novolog 5 units at breakfast, 7 units at lunch, 7 units at supper. Hold if BS >125 mg/dl.  Current orders for Inpatient glycemic control:  Novolog 0-15 tid Novolog 0-5 units qhs Lantus 15 daily  Inpatient Diabetes Program Recommendations:     Fasting CBG's elevated Lantus 24 units daily (80% home dose)    Will continue to follow while inpatient.  Thank you, Dulce Sellar, RN, BSN Diabetes Coordinator Inpatient Diabetes Program [phone] (team pager from 8a-5p)

## 2020-04-30 NOTE — Progress Notes (Signed)
Pt given 500 cc of SMOG enema with good results, he states he feels much better and abd is less tight.  He had some BMs per pt report. Pt became nauseated and was given Reglan 10 mg IV with relief.

## 2020-05-01 LAB — GLUCOSE, CAPILLARY
Glucose-Capillary: 230 mg/dL — ABNORMAL HIGH (ref 70–99)
Glucose-Capillary: 193 mg/dL — ABNORMAL HIGH (ref 70–99)

## 2020-05-01 MED ORDER — ONDANSETRON HCL 4 MG PO TABS: 4.0000 mg | ORAL_TABLET | Freq: Every day | ORAL | 1 refills | Status: DC | PRN

## 2020-05-01 MED ORDER — MAGNESIUM SULFATE 4 GM/100ML IV SOLN
4.0000 g | Freq: Once | INTRAVENOUS | Status: AC
  Administered 2020-05-01: 4 g via INTRAVENOUS
  Filled 2020-05-01: qty 100

## 2020-05-01 NOTE — Discharge Summary (Signed)
Physician Discharge Summary  Gwenyth OberBrandon Winch ZOX:[id number] DOB: 06/16/1986 DOA: 04/27/2020  PCP: Kallie LocksStroud, Natalie M, FNP  Admit date: 04/27/2020 Discharge date: 05/01/2020  Admitted From:home Disposition: home Recommendations for Outpatient Follow-up:  1. Follow up with PCP in 1-2 weeks 2. Please obtain BMP/CBC in one week   Home Health:none Equipment/Devices:none Discharge Condition stable CODE STATUS full Diet recommendation: cardiac Brief/Interim Summary:34 year old male type 1 diabetes with history of DKA admitted with nausea and vomiting.  He denied fever chills abdominal pain diarrhea or urinary complaints.  He reports that he lost appetite few days prior to admission and has been trying to cut down on his insulin at home.  In the ER his glucose was above 300 with a gap of 21 bicarb 19.  UA positive for ketones.  Assessment & Plan:   Discharge Diagnoses:  Active Problems:   Nausea   Intractable nausea and vomiting   DKA (diabetic ketoacidoses) (HCC)   Hyperglycemia   #1 DKA-patient decreased the dose of insulin that he was taking at home by himself.  He was initially treated with insulin drip and Lantus was started. He continued with nausea and vomiting during the hospital stay.  He was also constipated.  Once her constipation was relieved his nausea got better.  On the day of discharge she was able to tolerate a diet without any nausea or vomiting.  He was treated with IV fluids.  He is asked to continue his home medications and not to decrease the dose by himself. Hemoglobin A1c 7.0 UA positive ketones glucose Beta hydroxybutyrate 2.55    #2 AKI secondary to dehydration nausea and vomiting.resolved with  IV fluids.    #3 leukocytosis-White count 8.1 normalized without antibiotics likely secondary to DKA.  No evidence of infection noted.   #4 polysubstance abuse urine drug screen positive for THC  #5 persistent nausea and vomiting-in the setting of constipation versus  diabetic gastroparesis versus gastritis versus marijuana hyperemesis syndrome.  He was treated symptomatically with Zofran and Phenergan and scopolamine patch.  Estimated body mass index is 27.89 kg/m as calculated from the following:   Height as of this encounter: 5\' 11"  (1.803 m).   Weight as of this encounter: 90.7 kg.  Discharge Instructions   Allergies as of 05/01/2020      Reactions   Clindamycin/lincomycin Anaphylaxis   Bactrim [sulfamethoxazole-trimethoprim] Swelling   Eye swelling and bumps on eyes       Medication List    STOP taking these medications   cetirizine 10 MG tablet Commonly known as: ZYRTEC   diphenhydrAMINE 25 MG tablet Commonly known as: BENADRYL     TAKE these medications   insulin aspart 100 UNIT/ML injection Commonly known as: NovoLOG INJECT 5 UNITS INTO THE SKIN WITH BREAKFAST, 7 UNITS WITH LUNCH, & 7 UNITS WITH DINNER. HOLD SHORT-ACTING INSULIN IF BLOOD SUGAR <125 What changed:   how much to take  how to take this  when to take this  additional instructions   insulin glargine 100 UNIT/ML injection Commonly known as: LANTUS Inject 0.3 mLs (30 Units total) into the skin at bedtime.   ondansetron 4 MG tablet Commonly known as: Zofran Take 1 tablet (4 mg total) by mouth daily as needed for nausea or vomiting.   triamcinolone cream 0.1 % Commonly known as: KENALOG Apply 1 application topically 2 (two) times daily.       Follow-up Information    [facility]. Schedule an appointment as soon as possible for  a visit.   Contact information: [address] [place] [address] [phone]             Allergies  Allergen Reactions  . Clindamycin/Lincomycin Anaphylaxis  . Bactrim [Sulfamethoxazole-Trimethoprim] Swelling    Eye swelling and bumps on eyes     Consultations: none  Procedures/Studies: DG Abd 1 View  Result Date: 04/29/2020 CLINICAL DATA:  Nausea and vomiting  EXAM: ABDOMEN - 1 VIEW COMPARISON:  Abdominal radiograph dated 05/03/2019. FINDINGS: The bowel gas pattern is normal. No radio-opaque calculi or other significant radiographic abnormality are seen. IMPRESSION: Negative. Electronically Signed   By: Romona Curls M.D.   On: 04/29/2020 09:51    (Echo, Carotid, EGD, Colonoscopy, ERCP)    Subjective:  Denies any nausea vomiting today.  multiple bowel movements after stool softeners and enema. Wants to eat food.  Asking to go home. Discharge Exam: Vitals:   04/30/20 2026 05/01/20 0458  BP: (!) 150/99 (!) 159/97  Pulse: 94 88  Resp: 17 17  Temp: 99.4 F (37.4 C) 99.4 F (37.4 C)  SpO2: 97% 98%   Vitals:   04/30/20 0507 04/30/20 1538 04/30/20 2026 05/01/20 0458  BP: (!) 149/99 (!) 138/93 (!) 150/99 (!) 159/97  Pulse: 68 99 94 88  Resp: 16 17 17 17   Temp: 98.4 F (36.9 C) 99 F (37.2 C) 99.4 F (37.4 C) 99.4 F (37.4 C)  TempSrc: Oral Oral Oral Oral  SpO2: 97% 97% 97% 98%  Weight:      Height:        General: Pt is alert, awake, not in acute distress Cardiovascular: RRR, S1/S2 +, no rubs, no gallops Respiratory: CTA bilaterally, no wheezing, no rhonchi Abdominal: Soft, NT, ND, bowel sounds + Extremities: no edema, no cyanosis    The results of significant diagnostics from this hospitalization (including imaging, microbiology, ancillary and laboratory) are listed below for reference.     Microbiology: Recent Results (from the past 240 hour(s))  SARS Coronavirus 2 by RT PCR (hospital order, performed in [facility] hospital lab) Nasopharyngeal Nasopharyngeal Swab     Status: None   Collection Time: 04/27/20  4:20 PM   Specimen: Nasopharyngeal Swab  Result Value Ref Range Status   SARS Coronavirus 2 NEGATIVE NEGATIVE Final    Comment: (NOTE) SARS-CoV-2 target nucleic acids are NOT DETECTED.  The SARS-CoV-2 RNA is generally detectable in upper and lower respiratory specimens during the acute phase of infection. The  lowest concentration of SARS-CoV-2 viral copies this assay can detect is 250 copies / mL. A negative result does not preclude SARS-CoV-2 infection and should not be used as the sole basis for treatment or other patient management decisions.  A negative result may occur with improper specimen collection / handling, submission of specimen other than nasopharyngeal swab, presence of viral mutation(s) within the areas targeted by this assay, and inadequate number of viral copies (<250 copies / mL). A negative result must be combined with clinical observations, patient history, and epidemiological information.  Fact Sheet for Patients:   06/28/20  Fact Sheet for Healthcare Providers: BoilerBrush.com.cy  This test is not yet approved or  cleared by the https://pope.com/ FDA and has been authorized for detection and/or diagnosis of SARS-CoV-2 by FDA under an Emergency Use Authorization (EUA).  This EUA will remain in effect (meaning this test can be used) for the duration of the COVID-19 declaration under Section 564(b)(1) of the Act, 21 U.S.C. section 360bbb-3(b)(1), unless the authorization is terminated or  revoked sooner.  Performed at [facility], [address], [place], [place] [address]      Labs: BNP (last 3 results) No results for input(s): BNP in the last 8760 hours. Basic Metabolic Panel: Recent Labs  Lab 04/27/20 1111 04/28/20 0336 04/29/20 0243 04/29/20 1501 04/30/20 0836  NA 139 140 138 139 138  K 3.8 4.2 3.6 3.9 3.4*  CL 106 109 100 105 100  CO2 21* 22 22 21* 24  GLUCOSE 209* 73 269* 208* 270*  BUN 19 11 7 15 10   CREATININE 1.27* 0.92 1.17 1.31* 1.05  CALCIUM 8.5* 8.2* 9.0 8.7* 8.4*  MG  --   --   --   --  1.5*   Liver Function Tests: Recent Labs  Lab 04/27/20 0554 04/29/20 0243  AST 23 16  ALT 21 18  ALKPHOS 72 56  BILITOT 1.8* 1.7*  PROT 8.2* 6.5  ALBUMIN 5.1* 3.8   Recent Labs  Lab  04/27/20 0554 04/29/20 0243  LIPASE 18 16   No results for input(s): AMMONIA in the last 168 hours. CBC: Recent Labs  Lab 04/27/20 0554 04/27/20 0619 04/29/20 0243 04/30/20 0836  WBC 18.9*  --  13.4* 8.1  NEUTROABS 16.0*  --   --   --   HGB 18.2* 18.7*  18.4* 15.4 15.0  HCT 54.7* 55.0*  54.0* 45.2 45.2  MCV 92.9  --  91.3 91.3  PLT PLATELET CLUMPS NOTED ON SMEAR, COUNT APPEARS ADEQUATE  --  271 217   Cardiac Enzymes: No results for input(s): CKTOTAL, CKMB, CKMBINDEX, TROPONINI in the last 168 hours. BNP: Invalid input(s): POCBNP CBG: Recent Labs  Lab 04/30/20 0817 04/30/20 1228 04/30/20 1639 04/30/20 2028 05/01/20 0806  GLUCAP 263* 172* 167* 154* 230*   D-Dimer No results for input(s): DDIMER in the last 72 hours. Hgb A1c No results for input(s): HGBA1C in the last 72 hours. Lipid Profile No results for input(s): CHOL, HDL, LDLCALC, TRIG, CHOLHDL, LDLDIRECT in the last 72 hours. Thyroid function studies No results for input(s): TSH, T4TOTAL, T3FREE, THYROIDAB in the last 72 hours.  Invalid input(s): FREET3 Anemia work up No results for input(s): VITAMINB12, FOLATE, FERRITIN, TIBC, IRON, RETICCTPCT in the last 72 hours. Urinalysis    Component Value Date/Time   COLORURINE YELLOW 04/27/2020 1255   APPEARANCEUR CLEAR 04/27/2020 1255   LABSPEC 1.029 04/27/2020 1255   PHURINE 5.0 04/27/2020 1255   GLUCOSEU >=500 (A) 04/27/2020 1255   HGBUR NEGATIVE 04/27/2020 1255   BILIRUBINUR NEGATIVE 04/27/2020 1255   BILIRUBINUR Negative 11/29/2019 1348   KETONESUR 80 (A) 04/27/2020 1255   PROTEINUR NEGATIVE 04/27/2020 1255   UROBILINOGEN 0.2 11/29/2019 1348   UROBILINOGEN 0.2 01/19/2018 0838   NITRITE NEGATIVE 04/27/2020 1255   LEUKOCYTESUR NEGATIVE 04/27/2020 1255   Sepsis Labs Invalid input(s): PROCALCITONIN,  WBC,  LACTICIDVEN Microbiology Recent Results (from the past 240 hour(s))  SARS Coronavirus 2 by RT PCR (hospital order, performed in [facility] hospital  lab) Nasopharyngeal Nasopharyngeal Swab     Status: None   Collection Time: 04/27/20  4:20 PM   Specimen: Nasopharyngeal Swab  Result Value Ref Range Status   SARS Coronavirus 2 NEGATIVE NEGATIVE Final    Comment: (NOTE) SARS-CoV-2 target nucleic acids are NOT DETECTED.  The SARS-CoV-2 RNA is generally detectable in upper and lower respiratory specimens during the acute phase of infection. The lowest concentration of SARS-CoV-2 viral copies this assay can detect is 250 copies / mL. A negative result does not preclude  SARS-CoV-2 infection and should not be used as the sole basis for treatment or other patient management decisions.  A negative result may occur with improper specimen collection / handling, submission of specimen other than nasopharyngeal swab, presence of viral mutation(s) within the areas targeted by this assay, and inadequate number of viral copies (<250 copies / mL). A negative result must be combined with clinical observations, patient history, and epidemiological information.  Fact Sheet for Patients:   BoilerBrush.com.cy  Fact Sheet for Healthcare Providers: https://pope.com/  This test is not yet approved or  cleared by the [place] FDA and has been authorized for detection and/or diagnosis of SARS-CoV-2 by FDA under an Emergency Use Authorization (EUA).  This EUA will remain in effect (meaning this test can be used) for the duration of the COVID-19 declaration under Section 564(b)(1) of the Act, 21 U.S.C. section 360bbb-3(b)(1), unless the authorization is terminated or revoked sooner.  Performed at [facility], [address], [place], [place] [address]      Time coordinating discharge:  39 minutes  SIGNED:   Alwyn Ren, MD  [facility] 05/01/2020, 10:20 AM If 7PM-7AM, please contact night-coverage [email or web address] Password [email or web address]

## 2020-05-01 NOTE — Progress Notes (Signed)
Inpatient Diabetes Program Recommendations  AACE/ADA: New Consensus Statement on Inpatient Glycemic Control (2015)  Target Ranges:  Prepandial:   less than 140 mg/dL      Peak postprandial:   less than 180 mg/dL (1-2 hours)      Critically ill patients:  140 - 180 mg/dL   Lab Results  Component Value Date   GLUCAP 193 (H) 05/01/2020   HGBA1C 7.0 (H) 04/27/2020    Review of Glycemic Control Results for Zachary Vazquez, Zachary Vazquez (MRN [id number]) as of 05/01/2020 12:56  Ref. Range 04/30/2020 12:28 04/30/2020 16:39 04/30/2020 20:28 05/01/2020 08:06 05/01/2020 11:47  Glucose-Capillary Latest Ref Range: 70 - 99 mg/dL 009 (H) 381 (H) 829 (H) 230 (H) 193 (H)   Diabetes history:  DM1(does not make insulin.  Needs correction, basal and meal coverage)  Outpatient Diabetes medications:  Lantus 24 units daily  Novolog 0-15 tid Novolog 0-5 daily  Current orders for Inpatient glycemic control:  Lantus 30 units daily  Novolog 5 units with breakfast, 7 with lunch, 7 with supper  Note:  Spoke with patient at bedside.  He is unsure what brought on DKA episode this admission.  He states he has been taking his insulin as prescribed.  Asked if he cut his dose at all and he denies.  He rotates sites on his abdomen.  He is current with [facility] and does not have insurance.  He obtains his insulins without difficulty.  He does not check his blood sugar.  He states he treats lows and highs by how he feels.  Explained that it would be best to monitor CBG's by finger sticks but he states he is not willing to prick his fingers anymore.  Explained long and short term complications of diabetes.  He has had DM1 since he was 35 yrs old.  He would really like a sensor but is uninsured and is currently jobless.  Encouraged him to reach out to [facility] and apply for assistance.  He would benefit greatly from a sensor.  We reviewed hypoglycemia and treatment.    Will continue to follow while inpatient.  Thank you, Dulce Sellar, RN, BSN Diabetes  Coordinator Inpatient Diabetes Program [phone] (team pager from 8a-5p)

## 2020-05-01 NOTE — Plan of Care (Signed)
  Problem: Education: Goal: Knowledge of General Education information will improve Description Including pain rating scale, medication(s)/side effects and non-pharmacologic comfort measures Outcome: Progressing   Problem: Health Behavior/Discharge Planning: Goal: Ability to manage health-related needs will improve Outcome: Progressing   Problem: Clinical Measurements: Goal: Ability to maintain clinical measurements within normal limits will improve Outcome: Progressing Goal: Will remain free from infection Outcome: Progressing   Problem: Activity: Goal: Risk for activity intolerance will decrease Outcome: Progressing   Problem: Nutrition: Goal: Adequate nutrition will be maintained Outcome: Progressing   Problem: Coping: Goal: Level of anxiety will decrease Outcome: Progressing   Problem: Elimination: Goal: Will not experience complications related to bowel motility Outcome: Progressing   Problem: Pain Managment: Goal: General experience of comfort will improve Outcome: Progressing   Problem: Safety: Goal: Ability to remain free from injury will improve Outcome: Progressing   Problem: Skin Integrity: Goal: Risk for impaired skin integrity will decrease Outcome: Progressing   

## 2020-05-01 NOTE — Progress Notes (Signed)
AVS given and reviewed with pt. Medications discussed. All questions answered to satisfaction. Pt verbalized understanding of information given. Pt chose to walk off the unit with all belongings.  

## 2020-05-02 MED FILL — ONDANSETRON HCL 4 MG TABLET: 4 | 30 days supply | Qty: 30 | Fill #0

## 2020-05-10 ENCOUNTER — Inpatient Hospital Stay (HOSPITAL_COMMUNITY)
Admission: EM | Admit: 2020-05-10 | Discharge: 2020-05-15 | DRG: 637 | Disposition: A | Discharge status: Home / Self Care | Payer: Self-pay | Attending: Internal Medicine | Admitting: Internal Medicine

## 2020-05-10 ENCOUNTER — Other Ambulatory Visit: Payer: Self-pay

## 2020-05-10 ENCOUNTER — Observation Stay (HOSPITAL_COMMUNITY): Payer: Self-pay

## 2020-05-10 ENCOUNTER — Encounter (HOSPITAL_COMMUNITY): Payer: Self-pay | Admitting: Pediatrics

## 2020-05-10 DIAGNOSIS — E876 Hypokalemia: Secondary | ICD-10-CM | POA: Diagnosis not present

## 2020-05-10 DIAGNOSIS — F17210 Nicotine dependence, cigarettes, uncomplicated: Secondary | ICD-10-CM | POA: Diagnosis present

## 2020-05-10 DIAGNOSIS — Z79899 Other long term (current) drug therapy: Secondary | ICD-10-CM

## 2020-05-10 DIAGNOSIS — K2970 Gastritis, unspecified, without bleeding: Secondary | ICD-10-CM

## 2020-05-10 DIAGNOSIS — K3184 Gastroparesis: Secondary | ICD-10-CM | POA: Diagnosis present

## 2020-05-10 DIAGNOSIS — E1110 Type 2 diabetes mellitus with ketoacidosis without coma: Secondary | ICD-10-CM | POA: Diagnosis present

## 2020-05-10 DIAGNOSIS — R030 Elevated blood-pressure reading, without diagnosis of hypertension: Secondary | ICD-10-CM | POA: Diagnosis present

## 2020-05-10 DIAGNOSIS — K209 Esophagitis, unspecified without bleeding: Secondary | ICD-10-CM | POA: Diagnosis present

## 2020-05-10 DIAGNOSIS — E1010 Type 1 diabetes mellitus with ketoacidosis without coma: Principal | ICD-10-CM | POA: Diagnosis present

## 2020-05-10 DIAGNOSIS — E1043 Type 1 diabetes mellitus with diabetic autonomic (poly)neuropathy: Secondary | ICD-10-CM | POA: Diagnosis present

## 2020-05-10 DIAGNOSIS — K226 Gastro-esophageal laceration-hemorrhage syndrome: Secondary | ICD-10-CM | POA: Diagnosis present

## 2020-05-10 DIAGNOSIS — R6510 Systemic inflammatory response syndrome (SIRS) of non-infectious origin without acute organ dysfunction: Secondary | ICD-10-CM | POA: Diagnosis present

## 2020-05-10 DIAGNOSIS — E860 Dehydration: Secondary | ICD-10-CM

## 2020-05-10 DIAGNOSIS — R000 Tachycardia, unspecified: Secondary | ICD-10-CM

## 2020-05-10 DIAGNOSIS — K2900 Acute gastritis without bleeding: Secondary | ICD-10-CM | POA: Diagnosis present

## 2020-05-10 DIAGNOSIS — Z20822 Contact with and (suspected) exposure to covid-19: Secondary | ICD-10-CM | POA: Diagnosis present

## 2020-05-10 DIAGNOSIS — Z8249 Family history of ischemic heart disease and other diseases of the circulatory system: Secondary | ICD-10-CM

## 2020-05-10 DIAGNOSIS — Z794 Long term (current) use of insulin: Secondary | ICD-10-CM

## 2020-05-10 DIAGNOSIS — R112 Nausea with vomiting, unspecified: Secondary | ICD-10-CM | POA: Diagnosis present

## 2020-05-10 LAB — BASIC METABOLIC PANEL
Anion gap: 16 — ABNORMAL HIGH (ref 5–15)
BUN: 19 mg/dL (ref 6–20)
CO2: 18 mmol/L — ABNORMAL LOW (ref 22–32)
Calcium: 8.8 mg/dL — ABNORMAL LOW (ref 8.9–10.3)
Chloride: 102 mmol/L (ref 98–111)
Creatinine, Ser: 1.18 mg/dL (ref 0.61–1.24)
GFR calc Af Amer: >60 mL/min (ref >60)
GFR calc non Af Amer: >60 mL/min (ref >60)
Glucose, Bld: 350 mg/dL — ABNORMAL HIGH (ref 70–99)
Potassium: 4.9 mmol/L (ref 3.5–5.1)
Sodium: 136 mmol/L (ref 135–145)

## 2020-05-10 LAB — COMPREHENSIVE METABOLIC PANEL
ALT: 27 U/L (ref 0–44)
AST: 26 U/L (ref 15–41)
Albumin: 4.6 g/dL (ref 3.5–5.0)
Alkaline Phosphatase: 69 U/L (ref 38–126)
Anion gap: 16 — ABNORMAL HIGH (ref 5–15)
BUN: 11 mg/dL (ref 6–20)
CO2: 23 mmol/L (ref 22–32)
Calcium: 9.8 mg/dL (ref 8.9–10.3)
Chloride: 96 mmol/L — ABNORMAL LOW (ref 98–111)
Creatinine, Ser: 1.09 mg/dL (ref 0.61–1.24)
GFR calc Af Amer: >60 mL/min (ref >60)
GFR calc non Af Amer: >60 mL/min (ref >60)
Glucose, Bld: 277 mg/dL — ABNORMAL HIGH (ref 70–99)
Potassium: 4.7 mmol/L (ref 3.5–5.1)
Sodium: 135 mmol/L (ref 135–145)
Total Bilirubin: 2.2 mg/dL — ABNORMAL HIGH (ref 0.3–1.2)
Total Protein: 7.7 g/dL (ref 6.5–8.1)

## 2020-05-10 LAB — URINALYSIS, ROUTINE W REFLEX MICROSCOPIC
Bacteria, UA: NONE SEEN
Bilirubin Urine: NEGATIVE
Glucose, UA: >=500 mg/dL — AB
Hgb urine dipstick: NEGATIVE
Ketones, ur: 80 mg/dL — AB
Leukocytes,Ua: NEGATIVE
Nitrite: NEGATIVE
Protein, ur: NEGATIVE mg/dL
Specific Gravity, Urine: 1.027 (ref 1.005–1.030)
pH: 5 (ref 5.0–8.0)

## 2020-05-10 LAB — LIPASE, BLOOD: Lipase: 16 U/L (ref 11–51)

## 2020-05-10 LAB — CBC
HCT: 53.7 % — ABNORMAL HIGH (ref 39.0–52.0)
Hemoglobin: 17.6 g/dL — ABNORMAL HIGH (ref 13.0–17.0)
MCH: 30.7 pg (ref 26.0–34.0)
MCHC: 32.8 g/dL (ref 30.0–36.0)
MCV: 93.6 fL (ref 80.0–100.0)
Platelets: 352 10*3/uL (ref 150–400)
RBC: 5.74 MIL/uL (ref 4.22–5.81)
RDW: 12.6 % (ref 11.5–15.5)
WBC: 14.3 10*3/uL — ABNORMAL HIGH (ref 4.0–10.5)
nRBC: 0 % (ref 0.0–0.2)

## 2020-05-10 LAB — I-STAT VENOUS BLOOD GAS, ED
Acid-base deficit: 5 mmol/L — ABNORMAL HIGH (ref 0.0–2.0)
Bicarbonate: 19.9 mmol/L — ABNORMAL LOW (ref 20.0–28.0)
Calcium, Ion: 1.12 mmol/L — ABNORMAL LOW (ref 1.15–1.40)
HCT: 47 % (ref 39.0–52.0)
Hemoglobin: 16 g/dL (ref 13.0–17.0)
O2 Saturation: 88 %
Potassium: 4.9 mmol/L (ref 3.5–5.1)
Sodium: 138 mmol/L (ref 135–145)
TCO2: 21 mmol/L — ABNORMAL LOW (ref 22–32)
pCO2, Ven: 35.7 mmHg — ABNORMAL LOW (ref 44.0–60.0)
pH, Ven: 7.356 (ref 7.250–7.430)
pO2, Ven: 57 mmHg — ABNORMAL HIGH (ref 32.0–45.0)

## 2020-05-10 LAB — CBG MONITORING, ED
Glucose-Capillary: 167 mg/dL — ABNORMAL HIGH (ref 70–99)
Glucose-Capillary: 261 mg/dL — ABNORMAL HIGH (ref 70–99)
Glucose-Capillary: 329 mg/dL — ABNORMAL HIGH (ref 70–99)

## 2020-05-10 LAB — BETA-HYDROXYBUTYRIC ACID: Beta-Hydroxybutyric Acid: 4.5 mmol/L — ABNORMAL HIGH (ref 0.05–0.27)

## 2020-05-10 LAB — SARS CORONAVIRUS 2 BY RT PCR (HOSPITAL ORDER, PERFORMED IN ~~LOC~~ HOSPITAL LAB): SARS Coronavirus 2: NEGATIVE

## 2020-05-10 MED ORDER — SODIUM CHLORIDE 0.9 % IV BOLUS
1000.0000 mL | Freq: Once | INTRAVENOUS | Status: AC
  Administered 2020-05-10: 1000 mL via INTRAVENOUS

## 2020-05-10 MED ORDER — INSULIN REGULAR(HUMAN) IN NACL 100-0.9 UT/100ML-% IV SOLN
INTRAVENOUS | Status: DC
  Administered 2020-05-10: 13 [IU]/h via INTRAVENOUS
  Filled 2020-05-10: qty 100

## 2020-05-10 MED ORDER — ONDANSETRON 4 MG PO TBDP
4.0000 mg | ORAL_TABLET | Freq: Once | ORAL | Status: AC | PRN
  Administered 2020-05-10: 4 mg via ORAL
  Filled 2020-05-10: qty 1

## 2020-05-10 MED ORDER — METOCLOPRAMIDE HCL 5 MG/ML IJ SOLN
10.0000 mg | Freq: Once | INTRAMUSCULAR | Status: AC
  Administered 2020-05-10: 10 mg via INTRAVENOUS
  Filled 2020-05-10: qty 2

## 2020-05-10 MED ORDER — ONDANSETRON HCL 4 MG/2ML IJ SOLN
4.0000 mg | Freq: Once | INTRAMUSCULAR | Status: AC
  Administered 2020-05-10: 4 mg via INTRAVENOUS
  Filled 2020-05-10: qty 2

## 2020-05-10 MED ORDER — HALOPERIDOL LACTATE 5 MG/ML IJ SOLN
5.0000 mg | Freq: Once | INTRAMUSCULAR | Status: AC
  Administered 2020-05-10: 5 mg via INTRAVENOUS
  Filled 2020-05-10: qty 1

## 2020-05-10 MED ORDER — PANTOPRAZOLE SODIUM 40 MG IV SOLR
40.0000 mg | Freq: Two times a day (BID) | INTRAVENOUS | Status: DC
  Administered 2020-05-10 – 2020-05-15 (×10): 40 mg via INTRAVENOUS
  Filled 2020-05-10 (×9): qty 40

## 2020-05-10 MED ORDER — CAPSAICIN 0.025 % EX CREA
1.0000 "application " | TOPICAL_CREAM | Freq: Two times a day (BID) | CUTANEOUS | Status: DC
  Filled 2020-05-10: qty 60

## 2020-05-10 MED ORDER — DEXTROSE 50 % IV SOLN: 0.0000 mL | INTRAVENOUS | Status: DC | PRN

## 2020-05-10 MED ORDER — DEXTROSE-NACL 5-0.45 % IV SOLN: INTRAVENOUS | Status: DC

## 2020-05-10 MED ORDER — POTASSIUM CHLORIDE 10 MEQ/100ML IV SOLN
10.0000 meq | INTRAVENOUS | Status: AC
  Administered 2020-05-10 (×2): 10 meq via INTRAVENOUS
  Filled 2020-05-10 (×2): qty 100

## 2020-05-10 MED ORDER — SODIUM CHLORIDE 0.9 % IV SOLN: INTRAVENOUS | Status: DC

## 2020-05-10 MED ORDER — SODIUM CHLORIDE 0.9% FLUSH
3.0000 mL | Freq: Once | INTRAVENOUS | Status: AC
  Administered 2020-05-10: 3 mL via INTRAVENOUS

## 2020-05-10 NOTE — ED Notes (Signed)
Patient's HR elevated. MD made aware.

## 2020-05-10 NOTE — H&P (Signed)
History and Physical    Zachary Vazquez ASN:[id number] DOB: Jul 17, 1986 DOA: 05/10/2020  PCP: [name], FNP  Patient coming from: Home   Chief Complaint:  Chief Complaint  Patient presents with  . Emesis     HPI:    34 year old male with past medical history of diabetes mellitus type 1, nicotine dependence presenting with complaints of intractable nausea vomiting.  Patient explains that yesterday he began to experience intense nausea.  Patient's nausea prevented him from eating or consuming any liquids throughout the day.  This morning, the patient began to experience frequent bouts of vomiting.  Vomitus was occasionally bilious with occasional mixed bright red blood.  Patient explains that he has been vomiting several times hourly and has continued to be unable to tolerate any oral intake.  Patient states that alongside his symptoms he has had almost no urine output.  Patient is complaining of associated generalized weakness and lightheadedness.  Patient denies any associated abdominal pain, fevers, sick contacts, recent ingestion of undercooked food, recent antibiotic use, recent travel, recent camping or confirmed contact with known COVID-19 infection.  Patient symptoms continue to persist until he eventually presented to [facility] emergency department.  Upon evaluation in the emergency department patient was found to be tachycardic with leukocytosis and clinical evidence of frequent bouts of nausea and vomiting, refractory to doses of intravenous Zofran, Phenergan and Haldol.  Work-up in the emergency department reveals that the patient is suffering from mild diabetic ketoacidosis alongside his intractable nausea and vomiting.  The hospitalist group is now been called to assess the patient for admission the hospital.   Review of Systems: A 10-system review of systems has been performed and all systems are negative with the exception of what is listed in the HPI.    Past  Medical History:  Diagnosis Date  . History of delayed wound healing 10/2019  . Rash and nonspecific skin eruption 10/2019  . Type 1 diabetes mellitus (HCC)   . Wound of lower extremity, left, sequela 10/2019    Past Surgical History:  Procedure Laterality Date  . TONSILLECTOMY       reports that he has been smoking cigarettes. He has a 3.75 pack-year smoking history. He has never used smokeless tobacco. He reports current alcohol use. He reports previous drug use. Drug: Marijuana.  Allergies  Allergen Reactions  . Clindamycin/Lincomycin Anaphylaxis  . Bactrim [Sulfamethoxazole-Trimethoprim] Swelling and Other (See Comments)    Eyes swell and bumps appear on/near the eyes     Family History  Problem Relation Age of Onset  . Hypertension Mother   . Diabetes Mellitus I Brother      Prior to Admission medications   Medication Sig Start Date End Date Taking? Authorizing Provider  insulin aspart (NOVOLOG) 100 UNIT/ML injection INJECT 5 UNITS INTO THE SKIN WITH BREAKFAST, 7 UNITS WITH LUNCH, & 7 UNITS WITH DINNER. HOLD SHORT-ACTING INSULIN IF BLOOD SUGAR <125 Patient taking differently: Inject 5-7 Units into the skin See admin instructions. Inject 5 units into the skin with breakfast, 7 units with lunch, and 7 units with dinner/evening meal, per sliding scale and hold if BGL is less than 125 12/20/19  Yes [name], FNP  insulin glargine (LANTUS) 100 UNIT/ML injection Inject 0.3 mLs (30 Units total) into the skin at bedtime. 11/29/19  Yes [name], FNP  ondansetron (ZOFRAN) 4 MG tablet Take 1 tablet (4 mg total) by mouth daily as needed for nausea or vomiting. 05/01/20 05/01/21  Jerolyn Center,  Gardiner Ramus, MD  triamcinolone cream (KENALOG) 0.1 % Apply 1 application topically 2 (two) times daily. Patient not taking: Reported on 05/10/2020 04/11/20   [name], FNP    Physical Exam: Vitals:   05/10/20 1900 05/10/20 1904 05/10/20 2030 05/10/20 2130  BP: (!) 167/108  (!)  169/105 131/69  Pulse: (!) 132 (!) 150 (!) 131 (!) 120  Resp: 18 (!) 26 (!) 27 20  Temp:      TempSrc:      SpO2: 100% 100% 98% 97%  Weight:      Height:        Constitutional: Acute alert and oriented x3, patient is in distress due to severe nausea and frequent bouts of vomiting. Skin: no rashes, no lesions, poor skin turgor noted. Eyes: Pupils are equally reactive to light.  No evidence of scleral icterus or conjunctival pallor.  ENMT: Slightly dry mucous membranes noted.  Posterior pharynx clear of any exudate or lesions.   Neck: normal, supple, no masses, no thyromegaly.  No evidence of jugular venous distension.   Respiratory: clear to auscultation bilaterally, no wheezing, no crackles. Normal respiratory effort. No accessory muscle use.  Cardiovascular: Tachycardic rate with regular rhythm, no murmurs / rubs / gallops. No extremity edema. 2+ pedal pulses. No carotid bruits.  Chest:   Nontender without crepitus or deformity.   Back:   Nontender without crepitus or deformity. Abdomen: Epigastric tenderness.  Abdomen is soft..  No evidence of intra-abdominal masses.  Positive bowel sounds noted in all quadrants.   Musculoskeletal: No joint deformity upper and lower extremities. Good ROM, no contractures. Normal muscle tone.  Neurologic: CN 2-12 grossly intact. Sensation intact, strength noted to be 5 out of 5 in all 4 extremities.  Patient is following all commands.  Patient is responsive to verbal stimuli.   Psychiatric: Patient presents as a normal mood with appropriate affect.  Patient seems to possess insight as to theircurrent situation.     Labs on Admission: I have personally reviewed following labs and imaging studies -   CBC: Recent Labs  Lab 05/10/20 1356 05/10/20 2129  WBC 14.3*  --   HGB 17.6* 16.0  HCT 53.7* 47.0  MCV 93.6  --   PLT 352  --    Basic Metabolic Panel: Recent Labs  Lab 05/10/20 1356 05/10/20 2118 05/10/20 2129  NA 135 136 138  K 4.7 4.9 4.9    CL 96* 102  --   CO2 23 18*  --   GLUCOSE 277* 350*  --   BUN 11 19  --   CREATININE 1.09 1.18  --   CALCIUM 9.8 8.8*  --    GFR: Estimated Creatinine Clearance: 102.6 mL/min (by C-G formula based on SCr of 1.18 mg/dL). Liver Function Tests: Recent Labs  Lab 05/10/20 1356  AST 26  ALT 27  ALKPHOS 69  BILITOT 2.2*  PROT 7.7  ALBUMIN 4.6   Recent Labs  Lab 05/10/20 1356  LIPASE 16   No results for input(s): AMMONIA in the last 168 hours. Coagulation Profile: No results for input(s): INR, PROTIME in the last 168 hours. Cardiac Enzymes: No results for input(s): CKTOTAL, CKMB, CKMBINDEX, TROPONINI in the last 168 hours. BNP (last 3 results) No results for input(s): PROBNP in the last 8760 hours. HbA1C: No results for input(s): HGBA1C in the last 72 hours. CBG: Recent Labs  Lab 05/10/20 2058  GLUCAP 329*   Lipid Profile: No results for input(s): CHOL, HDL, LDLCALC, TRIG, CHOLHDL,  LDLDIRECT in the last 72 hours. Thyroid Function Tests: No results for input(s): TSH, T4TOTAL, FREET4, T3FREE, THYROIDAB in the last 72 hours. Anemia Panel: No results for input(s): VITAMINB12, FOLATE, FERRITIN, TIBC, IRON, RETICCTPCT in the last 72 hours. Urine analysis:    Component Value Date/Time   COLORURINE YELLOW 05/10/2020 1955   APPEARANCEUR CLEAR 05/10/2020 1955   LABSPEC 1.027 05/10/2020 1955   PHURINE 5.0 05/10/2020 1955   GLUCOSEU >=500 (A) 05/10/2020 1955   HGBUR NEGATIVE 05/10/2020 1955   BILIRUBINUR NEGATIVE 05/10/2020 1955   BILIRUBINUR Negative 11/29/2019 1348   KETONESUR 80 (A) 05/10/2020 1955   PROTEINUR NEGATIVE 05/10/2020 1955   UROBILINOGEN 0.2 11/29/2019 1348   UROBILINOGEN 0.2 01/19/2018 0838   NITRITE NEGATIVE 05/10/2020 1955   LEUKOCYTESUR NEGATIVE 05/10/2020 1955    Radiological Exams on Admission - Personally Reviewed: No results found.  EKG: Personally reviewed.  Rhythm is sinus tachycardia with heart rate of 125 bpm.  No dynamic ST segment changes  appreciated.  Assessment/Plan Principal Problem:   Uncontrolled type 1 diabetes mellitus with ketoacidosis without coma, with long-term current use of insulin (HCC)   Patient presenting with Mild ketonuria, hyperglycemia, slightly decreased bicarbonate with somewhat increased anion gap of 16 all consistent with mild diabetic ketoacidosis   Etiology is likely secondary to patient's acute gastrointestinal illness  Patient has been initiated on insulin infusion by the emergency department staff which we will continue.  Aggressively hydrating with intravenous isotonic fluids  Performing frequent point-of-care Accu-Cheks and chemistries to monitor anion gap.  Temporarily admitting to stepdown unit  Will transition to basal bolus insulin therapy once gap is closed.  active Problems:    Intractable nausea and vomiting   Frequent bouts of nausea and vomiting including many episodes in the emergency department.  We will keep patient n.p.o. for now  Providing patient with antiemetics including intravenous Zofran and Reglan.  Patient was administered a dose of Haldol in the emergency department without effect.  Monitoring on telemetry  Concerning the etiology of the patient's frequent bouts of nausea vomiting -there are multiple possible etiologies including cyclical vomiting syndrome, cannabinoid hyperemesis syndrome, gastric outlet obstruction.  Patient states that he has never had a true work-up in the past.  Hepatic function panel and lipase are unremarkable.  Urinalysis does not reveal any evidence of infection.  Patient states he has never had a gastric emptying study to evaluate for gastroparesis nor has he ever had an upper endoscopy to rule out gastric outlet obstruction.  While patient does occasionally smoke marijuana this should be a diagnosis of exclusion and therefore we cannot make the diagnosis of cannabinoid hyperemesis syndrome at this time.  Obtaining a one-time CT scan of  the abdomen pelvis as the patient has never actually had one before in our system nor can I find one in care everywhere.  Patient will need GI referral at time of discharge.   Mallory Weiss tear   Patient states that he is experiencing occasional bright red blood mixed with his vomitus  Considering frequency of patient's bouts of nausea and vomiting, patient most likely suffering from Mallory-Weiss tear.  It is also possible that patient is suffering from erosive gastritis and esophagitis considering significant epigastric tenderness on palpation  Placing patient on Protonix 40 mg IV every 12  Monitoring hemoglobin and hematocrit  Obtain coagulation profile  If hemoglobin drops substantially or bleeding increases will obtain gastroenterology consultation.    SIRS (systemic inflammatory response syndrome) (HCC)   Multiple SIRS criteria  noted  Based on history and physical examination I do not believe there is any compelling evidence of active infection  Urinalysis unremarkable  Obtaining chest x-ray    Nicotine dependence, cigarettes, uncomplicated    Counseling patient on smoking cessation daily  Patient declining nicotine patches  Code Status:  Full code Family Communication: Deferred   Status is: Observation  The patient remains OBS appropriate and will d/c before 2 midnights.  Dispo: The patient is from: Home              Anticipated d/c is to: Home              Anticipated d/c date is: 2 days              Patient currently is not medically stable to d/c.        Marinda ElkGeorge J Demaryius Imran MD [facility] Pager [phone]  If 7PM-7AM, please contact night-coverage [email or web address] Use universal [employer] password for that web site. If you do not have the password, please call the hospital operator.  05/10/2020, 10:10 PM

## 2020-05-10 NOTE — ED Provider Notes (Signed)
[facility] EMERGENCY DEPARTMENT Provider Note   CSN: [id number] Arrival date & time: 05/10/20  1336     History Chief Complaint  Patient presents with  . Emesis    Zachary Vazquez is a 34 y.o. male.  HPI He presents for evaluation of vomiting which started today.  It is similar to when he was recently admitted for the same problem.  At that time he was noted to be using THC products.  He denies fever, cough, shortness of breath, weakness or dizziness.  He is unable to give any additional history because he is actively vomiting.  There are no other known modifying factors.    Past Medical History:  Diagnosis Date  . History of delayed wound healing 10/2019  . Rash and nonspecific skin eruption 10/2019  . Type 1 diabetes mellitus (HCC)   . Wound of lower extremity, left, sequela 10/2019    Patient Active Problem List   Diagnosis Date Noted  . Uncontrolled type 1 diabetes mellitus with ketoacidosis without coma, with long-term current use of insulin (HCC) 05/10/2020  . Hyperglycemia 04/27/2020  . History of delayed wound healing 04/16/2020  . Hemoglobin A1c less than 7.0% 04/16/2020  . Allergies 04/16/2020  . Rash and nonspecific skin eruption 11/23/2019  . Spider bite 11/18/2019  . Wound of left leg 11/18/2019  . Intractable nausea and vomiting 05/04/2019  . DKA, type 1 (HCC) 05/04/2019  . DKA (diabetic ketoacidoses) (HCC) 05/04/2019  . Nausea 12/02/2017  . Tobacco use disorder 03/30/2017  . Type 1 diabetes mellitus (HCC) 03/30/2017  . Leukocytosis 03/30/2017    Past Surgical History:  Procedure Laterality Date  . TONSILLECTOMY         Family History  Problem Relation Age of Onset  . Hypertension Mother   . Diabetes Mellitus I Brother     Social History   Tobacco Use  . Smoking status: Current Every Day Smoker    Packs/day: 0.25    Years: 15.00    Pack years: 3.75    Types: Cigarettes  . Smokeless tobacco: Never Used  Vaping Use  .  Vaping Use: Never used  Substance Use Topics  . Alcohol use: Yes    Comment: occ  . Drug use: Not Currently    Types: Marijuana    Comment: occ    Home Medications Prior to Admission medications   Medication Sig Start Date End Date Taking? Authorizing Provider  insulin aspart (NOVOLOG) 100 UNIT/ML injection INJECT 5 UNITS INTO THE SKIN WITH BREAKFAST, 7 UNITS WITH LUNCH, & 7 UNITS WITH DINNER. HOLD SHORT-ACTING INSULIN IF BLOOD SUGAR <125 Patient taking differently: Inject 5-7 Units into the skin 3 (three) times daily with meals. Administer 5 units at breakfast, 7 units at lunch and dinner. Hold if blood sugar is less than 125. 12/20/19   [name], FNP  insulin glargine (LANTUS) 100 UNIT/ML injection Inject 0.3 mLs (30 Units total) into the skin at bedtime. 11/29/19   [name], FNP  ondansetron (ZOFRAN) 4 MG tablet Take 1 tablet (4 mg total) by mouth daily as needed for nausea or vomiting. 05/01/20 05/01/21  Alwyn Ren, MD  triamcinolone cream (KENALOG) 0.1 % Apply 1 application topically 2 (two) times daily. 04/11/20   [name], FNP    Allergies    Clindamycin/lincomycin and Bactrim [sulfamethoxazole-trimethoprim]  Review of Systems   Review of Systems  All other systems reviewed and are negative.   Physical Exam Updated Vital Signs BP Marland Kitchen)  169/105   Pulse (!) 131   Temp 98.5 F (36.9 C) (Oral)   Resp (!) 27   Ht 5\' 11"  (1.803 m)   Wt 90.7 kg   SpO2 98%   BMI 27.89 kg/m   Physical Exam Vitals and nursing note reviewed.  Constitutional:      Appearance: He is well-developed.  HENT:     Head: Normocephalic and atraumatic.     Right Ear: External ear normal.     Left Ear: External ear normal.     Mouth/Throat:     Mouth: Mucous membranes are moist.  Eyes:     Conjunctiva/sclera: Conjunctivae normal.     Pupils: Pupils are equal, round, and reactive to light.  Neck:     Trachea: Phonation normal.  Cardiovascular:     Rate and Rhythm:  Normal rate.  Pulmonary:     Effort: Pulmonary effort is normal.  Abdominal:     General: There is no distension.     Comments: Actively vomiting gastric contents without evidence of blood.  Musculoskeletal:        General: Normal range of motion.     Cervical back: Normal range of motion and neck supple.  Skin:    General: Skin is warm and dry.  Neurological:     Mental Status: He is alert and oriented to person, place, and time.     Cranial Nerves: No cranial nerve deficit.     Sensory: No sensory deficit.     Motor: No abnormal muscle tone.     Coordination: Coordination normal.  Psychiatric:        Mood and Affect: Mood normal.        Behavior: Behavior normal.     ED Results / Procedures / Treatments   Labs (all labs ordered are listed, but only abnormal results are displayed) Labs Reviewed  COMPREHENSIVE METABOLIC PANEL - Abnormal; Notable for the following components:      Result Value   Chloride 96 (*)    Glucose, Bld 277 (*)    Total Bilirubin 2.2 (*)    Anion gap 16 (*)    All other components within normal limits  CBC - Abnormal; Notable for the following components:   WBC 14.3 (*)    Hemoglobin 17.6 (*)    HCT 53.7 (*)    All other components within normal limits  URINALYSIS, ROUTINE W REFLEX MICROSCOPIC - Abnormal; Notable for the following components:   Glucose, UA >=500 (*)    Ketones, ur 80 (*)    All other components within normal limits  CBG MONITORING, ED - Abnormal; Notable for the following components:   Glucose-Capillary 329 (*)    All other components within normal limits  I-STAT VENOUS BLOOD GAS, ED - Abnormal; Notable for the following components:   pCO2, Ven 35.7 (*)    pO2, Ven 57.0 (*)    Bicarbonate 19.9 (*)    TCO2 21 (*)    Acid-base deficit 5.0 (*)    Calcium, Ion 1.12 (*)    All other components within normal limits  SARS CORONAVIRUS 2 BY RT PCR (HOSPITAL ORDER, PERFORMED IN [facility])  LIPASE, BLOOD  BASIC  METABOLIC PANEL  BASIC METABOLIC PANEL  BASIC METABOLIC PANEL  BASIC METABOLIC PANEL  BETA-HYDROXYBUTYRIC ACID  BETA-HYDROXYBUTYRIC ACID  CBG MONITORING, ED    EKG EKG Interpretation  Date/Time:  Wednesday May 10 2020 19:00:11 EDT Ventricular Rate:  125 PR Interval:    QRS Duration:  84 QT Interval:  310 QTC Calculation: 447 R Axis:   175 Text Interpretation: Sinus tachycardia Probable lateral infarct, old Probable anteroseptal infarct, old since last tracing no significant change Confirmed by Mancel Bale [phone]) on 05/10/2020 8:40:04 PM   Radiology No results found.  Procedures .Critical Care Performed by: Mancel Bale, MD Authorized by: Mancel Bale, MD   Critical care provider statement:    Critical care time (minutes):  35   Critical care start time:  05/10/2020 4:45 PM   Critical care end time:  05/10/2020 9:08 PM   Critical care time was exclusive of:  Separately billable procedures and treating other patients   Critical care was necessary to treat or prevent imminent or life-threatening deterioration of the following conditions:  Metabolic crisis   Critical care was time spent personally by me on the following activities:  Blood draw for specimens, development of treatment plan with patient or surrogate, discussions with consultants, evaluation of patient's response to treatment, examination of patient, obtaining history from patient or surrogate, ordering and performing treatments and interventions, ordering and review of laboratory studies, pulse oximetry, re-evaluation of patient's condition, review of old charts and ordering and review of radiographic studies   (including critical care time)  Medications Ordered in ED Medications  capsaicin (ZOSTRIX) 0.025 % cream 1 application (has no administration in time range)  insulin regular, human (MYXREDLIN) 100 units/ 100 mL infusion (has no administration in time range)  0.9 %  sodium chloride infusion (has no  administration in time range)  dextrose 5 %-0.45 % sodium chloride infusion (has no administration in time range)  dextrose 50 % solution 0-50 mL (has no administration in time range)  potassium chloride 10 mEq in 100 mL IVPB (has no administration in time range)  sodium chloride flush (NS) 0.9 % injection 3 mL (3 mLs Intravenous Given 05/10/20 1729)  ondansetron (ZOFRAN-ODT) disintegrating tablet 4 mg (4 mg Oral Given 05/10/20 1347)  sodium chloride 0.9 % bolus 1,000 mL (0 mLs Intravenous Stopped 05/10/20 1850)  ondansetron (ZOFRAN) injection 4 mg (4 mg Intravenous Given 05/10/20 1728)  sodium chloride 0.9 % bolus 1,000 mL (0 mLs Intravenous Stopped 05/10/20 2116)  haloperidol lactate (HALDOL) injection 5 mg (5 mg Intravenous Given 05/10/20 1729)    ED Course  I have reviewed the triage vital signs and the nursing notes.  Pertinent labs & imaging results that were available during my care of the patient were reviewed by me and considered in my medical decision making (see chart for details).  Clinical Course as of May 10 2130  Wed May 10, 2020  1709 Anion gap(!): 16 [EW]  2038 Normal except presence of ketones and glucose  Urinalysis, Routine w reflex microscopic(!) [EW]  2039 Normal except elevated white count, and hemoglobin  CBC(!) [EW]  2039 Normal except chloride low, glucose high, total bilirubin high, anion gap elevated  Comprehensive metabolic panel(!) [EW]  2109 Abnormal, 329, higher than initial.  POC CBG, ED(!) [EW]    Clinical Course User Index [EW] Mancel Bale, MD   MDM Rules/Calculators/A&P                           Patient Vitals for the past 24 hrs:  BP Temp Temp src Pulse Resp SpO2 Height Weight  05/10/20 2030 (!) 169/105 -- -- (!) 131 (!) 27 98 % -- --  05/10/20 1904 -- -- -- (!) 150 (!) 26 100 % -- --  05/10/20  1900 (!) 167/108 -- -- (!) 132 18 100 % -- --  05/10/20 1856 (!) 147/81 -- -- (!) 124 (!) 22 99 % -- --  05/10/20 1758 (!) 145/82 -- -- (!) 116 16 97  % -- --  05/10/20 1731 (!) 151/92 -- -- (!) 112 20 95 % -- --  05/10/20 1713 (!) 164/107 -- -- 81 (!) 26 100 % -- --  05/10/20 1711 (!) 164/107 -- -- (!) 108 18 98 % -- --  05/10/20 1535 (!) 156/110 -- -- (!) 105 15 100 % -- --  05/10/20 1343 (!) 156/110 -- -- -- -- -- 5\' 11"  (1.803 m) 90.7 kg  05/10/20 1341 (!) 165/134 98.5 F (36.9 C) Oral (!) 118 18 97 % -- --    8:40 PM Reevaluation with update and discussion. After initial assessment and treatment, an updated evaluation reveals he remains tachycardic, with periods of extreme tachycardia 1 40-150.  Glucose still elevated, despite high-volume saline bolus.  Additional treatment initiated.Mancel Bale. Mckinnley Smithey   Medical Decision Making:  This patient is presenting for evaluation of recurrent vomiting and hyperglycemia, which does require a range of treatment options, and is a complaint that involves a high risk of morbidity and mortality. The differential diagnoses include simple hyperglycemia, DKA, hyperemesis cannabinoid syndrome. I decided to review old records, and in summary patient with recurrent vomiting and DKA, with known marijuana abuse.  I did not require additional historical information from anyone.  Clinical Laboratory Tests Ordered, included CBC, Metabolic panel and Urinalysis. Review indicates mild DKA, hyperglycemia, hypochloremia, elevated anion gap, elevated white count, urine with ketones and glucose present.   Cardiac Monitor Tracing which shows sinus tachycardia   Critical Interventions-clinical evaluation, laboratory testing, high-volume saline bolus, reassessment, initiation of insulin treatment, additional saline boluses, DKA order set initiated  After These Interventions, the Patient was reevaluated and was found to require hospitalization  CRITICAL CARE-yes Performed by: Mancel BaleElliott Redding Cloe  Nursing Notes Reviewed/ Care Coordinated Applicable Imaging Reviewed Interpretation of Laboratory Data incorporated into ED  treatment    9:15 PM-Consult complete with hospitalist. Patient case explained and discussed.  He agrees to admit patient for further evaluation and treatment. Call ended at 9:25 PM  Plan: Admit  Final Clinical Impression(s) / ED Diagnoses Final diagnoses:  Diabetic ketoacidosis without coma associated with type 1 diabetes mellitus (HCC)  Dehydration  Tachycardia    Rx / DC Orders ED Discharge Orders    None       Mancel BaleWentz, Shawntina Diffee, MD 05/10/20 2131

## 2020-05-10 NOTE — ED Triage Notes (Signed)
Patient reported hx of DM I and concern for DKA; c/o vomiting since last night

## 2020-05-11 ENCOUNTER — Observation Stay (HOSPITAL_COMMUNITY): Payer: Self-pay

## 2020-05-11 LAB — BASIC METABOLIC PANEL
Anion gap: 12 (ref 5–15)
Anion gap: 9 (ref 5–15)
Anion gap: 9 (ref 5–15)
BUN: 16 mg/dL (ref 6–20)
BUN: 14 mg/dL (ref 6–20)
BUN: 11 mg/dL (ref 6–20)
CO2: 21 mmol/L — ABNORMAL LOW (ref 22–32)
CO2: 22 mmol/L (ref 22–32)
CO2: 23 mmol/L (ref 22–32)
Calcium: 8.9 mg/dL (ref 8.9–10.3)
Calcium: 8.6 mg/dL — ABNORMAL LOW (ref 8.9–10.3)
Calcium: 8.8 mg/dL — ABNORMAL LOW (ref 8.9–10.3)
Chloride: 105 mmol/L (ref 98–111)
Chloride: 104 mmol/L (ref 98–111)
Chloride: 105 mmol/L (ref 98–111)
Creatinine, Ser: 1 mg/dL (ref 0.61–1.24)
Creatinine, Ser: 0.98 mg/dL (ref 0.61–1.24)
Creatinine, Ser: 0.89 mg/dL (ref 0.61–1.24)
GFR calc Af Amer: >60 mL/min (ref >60)
GFR calc Af Amer: >60 mL/min (ref >60)
GFR calc Af Amer: >60 mL/min (ref >60)
GFR calc non Af Amer: >60 mL/min (ref >60)
GFR calc non Af Amer: >60 mL/min (ref >60)
GFR calc non Af Amer: >60 mL/min (ref >60)
Glucose, Bld: 153 mg/dL — ABNORMAL HIGH (ref 70–99)
Glucose, Bld: 215 mg/dL — ABNORMAL HIGH (ref 70–99)
Glucose, Bld: 119 mg/dL — ABNORMAL HIGH (ref 70–99)
Potassium: 4.2 mmol/L (ref 3.5–5.1)
Potassium: 3.9 mmol/L (ref 3.5–5.1)
Potassium: 3.7 mmol/L (ref 3.5–5.1)
Sodium: 138 mmol/L (ref 135–145)
Sodium: 135 mmol/L (ref 135–145)
Sodium: 137 mmol/L (ref 135–145)

## 2020-05-11 LAB — CBC WITH DIFFERENTIAL/PLATELET
Abs Immature Granulocytes: 0.09 10*3/uL — ABNORMAL HIGH (ref 0.00–0.07)
Basophils Absolute: 0 10*3/uL (ref 0.0–0.1)
Basophils Relative: 0 %
Eosinophils Absolute: 0 10*3/uL (ref 0.0–0.5)
Eosinophils Relative: 0 %
HCT: 44.4 % (ref 39.0–52.0)
Hemoglobin: 14.8 g/dL (ref 13.0–17.0)
Immature Granulocytes: 1 %
Lymphocytes Relative: 10 %
Lymphs Abs: 1.4 10*3/uL (ref 0.7–4.0)
MCH: 31.3 pg (ref 26.0–34.0)
MCHC: 33.3 g/dL (ref 30.0–36.0)
MCV: 93.9 fL (ref 80.0–100.0)
Monocytes Absolute: 0.8 10*3/uL (ref 0.1–1.0)
Monocytes Relative: 6 %
Neutro Abs: 11.9 10*3/uL — ABNORMAL HIGH (ref 1.7–7.7)
Neutrophils Relative %: 83 %
Platelets: 342 10*3/uL (ref 150–400)
RBC: 4.73 MIL/uL (ref 4.22–5.81)
RDW: 12.9 % (ref 11.5–15.5)
WBC: 14.2 10*3/uL — ABNORMAL HIGH (ref 4.0–10.5)
nRBC: 0 % (ref 0.0–0.2)

## 2020-05-11 LAB — APTT: aPTT: 30 seconds (ref 24–36)

## 2020-05-11 LAB — CBG MONITORING, ED
Glucose-Capillary: 140 mg/dL — ABNORMAL HIGH (ref 70–99)
Glucose-Capillary: 144 mg/dL — ABNORMAL HIGH (ref 70–99)
Glucose-Capillary: 153 mg/dL — ABNORMAL HIGH (ref 70–99)
Glucose-Capillary: 158 mg/dL — ABNORMAL HIGH (ref 70–99)
Glucose-Capillary: 165 mg/dL — ABNORMAL HIGH (ref 70–99)
Glucose-Capillary: 166 mg/dL — ABNORMAL HIGH (ref 70–99)
Glucose-Capillary: 174 mg/dL — ABNORMAL HIGH (ref 70–99)
Glucose-Capillary: 200 mg/dL — ABNORMAL HIGH (ref 70–99)

## 2020-05-11 LAB — GLUCOSE, CAPILLARY
Glucose-Capillary: 126 mg/dL — ABNORMAL HIGH (ref 70–99)
Glucose-Capillary: 128 mg/dL — ABNORMAL HIGH (ref 70–99)
Glucose-Capillary: 134 mg/dL — ABNORMAL HIGH (ref 70–99)
Glucose-Capillary: 153 mg/dL — ABNORMAL HIGH (ref 70–99)
Glucose-Capillary: 170 mg/dL — ABNORMAL HIGH (ref 70–99)
Glucose-Capillary: 174 mg/dL — ABNORMAL HIGH (ref 70–99)
Glucose-Capillary: 205 mg/dL — ABNORMAL HIGH (ref 70–99)
Glucose-Capillary: 210 mg/dL — ABNORMAL HIGH (ref 70–99)
Glucose-Capillary: 362 mg/dL — ABNORMAL HIGH (ref 70–99)

## 2020-05-11 LAB — BETA-HYDROXYBUTYRIC ACID
Beta-Hydroxybutyric Acid: 1.27 mmol/L — ABNORMAL HIGH (ref 0.05–0.27)
Beta-Hydroxybutyric Acid: 0.83 mmol/L — ABNORMAL HIGH (ref 0.05–0.27)

## 2020-05-11 LAB — PROTIME-INR
INR: 1.1 (ref 0.8–1.2)
Prothrombin Time: 13.3 seconds (ref 11.4–15.2)

## 2020-05-11 LAB — OSMOLALITY: Osmolality: 297 mOsm/kg — ABNORMAL HIGH (ref 275–295)

## 2020-05-11 MED ORDER — LACTATED RINGERS IV SOLN: INTRAVENOUS | Status: DC

## 2020-05-11 MED ORDER — DEXTROSE 50 % IV SOLN: 0.0000 mL | INTRAVENOUS | Status: DC | PRN

## 2020-05-11 MED ORDER — INSULIN GLARGINE 100 UNIT/ML ~~LOC~~ SOLN
15.0000 [IU] | Freq: Two times a day (BID) | SUBCUTANEOUS | Status: DC
  Administered 2020-05-11 – 2020-05-15 (×9): 15 [IU] via SUBCUTANEOUS
  Filled 2020-05-11 (×10): qty 0.15

## 2020-05-11 MED ORDER — PROMETHAZINE HCL 25 MG/ML IJ SOLN
12.5000 mg | INTRAMUSCULAR | Status: DC | PRN
  Administered 2020-05-11 – 2020-05-12 (×3): 12.5 mg via INTRAVENOUS
  Filled 2020-05-11 (×3): qty 1

## 2020-05-11 MED ORDER — IOHEXOL 300 MG/ML  SOLN
100.0000 mL | Freq: Once | INTRAMUSCULAR | Status: AC | PRN
  Administered 2020-05-11: 100 mL via INTRAVENOUS

## 2020-05-11 MED ORDER — POTASSIUM CHLORIDE 10 MEQ/100ML IV SOLN
10.0000 meq | INTRAVENOUS | Status: DC
  Administered 2020-05-11: 10 meq via INTRAVENOUS
  Filled 2020-05-11: qty 100

## 2020-05-11 MED ORDER — BOOST / RESOURCE BREEZE PO LIQD CUSTOM
1.0000 | Freq: Three times a day (TID) | ORAL | Status: DC
  Administered 2020-05-12: 1 via ORAL

## 2020-05-11 MED ORDER — INSULIN ASPART 100 UNIT/ML ~~LOC~~ SOLN
0.0000 [IU] | Freq: Three times a day (TID) | SUBCUTANEOUS | Status: DC
  Administered 2020-05-11: 5 [IU] via SUBCUTANEOUS
  Administered 2020-05-12: 3 [IU] via SUBCUTANEOUS
  Administered 2020-05-12: 8 [IU] via SUBCUTANEOUS
  Administered 2020-05-12: 3 [IU] via SUBCUTANEOUS
  Administered 2020-05-13: 2 [IU] via SUBCUTANEOUS
  Administered 2020-05-13: 5 [IU] via SUBCUTANEOUS
  Administered 2020-05-14 – 2020-05-15 (×3): 2 [IU] via SUBCUTANEOUS

## 2020-05-11 MED ORDER — SODIUM CHLORIDE 0.9 % IV SOLN: INTRAVENOUS | Status: DC

## 2020-05-11 MED ORDER — ONDANSETRON 4 MG PO TBDP
4.0000 mg | ORAL_TABLET | Freq: Once | ORAL | Status: AC
  Administered 2020-05-11: 4 mg via ORAL
  Filled 2020-05-11: qty 1

## 2020-05-11 MED ORDER — INSULIN REGULAR(HUMAN) IN NACL 100-0.9 UT/100ML-% IV SOLN: INTRAVENOUS | Status: DC

## 2020-05-11 MED ORDER — DEXTROSE-NACL 5-0.45 % IV SOLN: INTRAVENOUS | Status: DC

## 2020-05-11 MED ORDER — PROMETHAZINE HCL 25 MG/ML IJ SOLN
12.5000 mg | Freq: Four times a day (QID) | INTRAMUSCULAR | Status: DC | PRN
  Administered 2020-05-11 (×2): 12.5 mg via INTRAVENOUS
  Filled 2020-05-11 (×2): qty 1

## 2020-05-11 MED ORDER — METOCLOPRAMIDE HCL 5 MG/ML IJ SOLN
5.0000 mg | Freq: Three times a day (TID) | INTRAMUSCULAR | Status: DC
  Administered 2020-05-11 – 2020-05-14 (×8): 5 mg via INTRAVENOUS
  Filled 2020-05-11 (×8): qty 2

## 2020-05-11 NOTE — Progress Notes (Signed)
PROGRESS NOTE    Zachary Vazquez  BPZ:[id number] DOB: 1986-04-13 DOA: 05/10/2020 PCP: [name], FNP    Brief Narrative:  Patient admitted to the hospital with working diagnosis of diabetes ketoacidosis.  34 year old male with significant past medical history for type 1 diabetes mellitus, and nicotine dependence who presented with intractable nausea and vomiting.  Unable to take any p.o. intake.  On his initial physical examination his blood pressure was 167/108, heart rate 150, respiratory 26, oxygen saturation 97%.  She had dry mucous membranes, her lungs are clear to auscultation bilaterally, heart S1-S2, present, tachycardic, abdomen tender at the epigastrium, soft, no guarding or rebound, no lower extremity edema. Sodium 135, potassium 4.7, chloride 96, bicarb 23, glucose 277, BUN 11, creatinine 1.0, anion gap 16, lipase 16, AST 26, ALT 27, white count 14.3, hemoglobin 17.6, hematocrit 53.7, platelets 352.  SARS COVID-19 negative. Urinalysis specific gravity 1.027, > 500 glucose.  Chest radiograph no infiltrates.  CT of the abdomen and pelvis with over distention of the colon, no bowel obstruction, normal appendix. EKG 125 bpm, normal axis, normal intervals, sinus rhythm, Q-wave lead I and aVL, poor R wave progression.  Assessment & Plan:   Principal Problem:   Uncontrolled type 1 diabetes mellitus with ketoacidosis without coma, with long-term current use of insulin (HCC) Active Problems:   Intractable nausea and vomiting   SIRS (systemic inflammatory response syndrome) (HCC)   Nicotine dependence, cigarettes, uncomplicated   Mallory-Weiss tear   1. DKA. Patient continue with nausea, has not been able to have po yet. His serum glucose continue to be elevated but anion gap has closed. K 3,9 and serum bicarbonate at 22 with Cl at 104. His heart rate continue to be elevated at 115 to 120 beats per minute.  At home uses 30 units of long acting insulin along with sliding scale.    Will continue with antiemetics and IV fluids, trial of clear liquid diet, if good toleration will transition to sq insulin 15 units lantus bid and sliding scale.   Will check BMP this pm at 16:00 for further electrolyte and anion gap monitoring.   2. Intractable nausea and vomiting. Will add tid metoclopramide IV scheduled, continue with as needed phenergan.  Continue antiacid therapy with pantoprazole.  3. Tobacco abuse. Continue smoking cessation    Patient continue to be at high risk for worsening hyperglycemia and DKA.   Status is: Observation  The patient will require care spanning > 2 midnights and should be moved to inpatient because: IV treatments appropriate due to intensity of illness or inability to take PO. Patient continue to have nausea and uncontrolled glucose.   Dispo: The patient is from: Home              Anticipated d/c is to: Home              Anticipated d/c date is: 1 day              Patient currently is not medically stable to d/c.   DVT prophylaxis: Enoxaparin   Code Status:   full  Family Communication:  No family at the bedside       Subjective: Patient continue to have nausea and has been NPO, he is not back to his baseline, continue to have elevated glucose.   Objective: Vitals:   05/11/20 1006 05/11/20 1008 05/11/20 1036 05/11/20 1047  BP: (!) 145/88  (!) 153/100   Pulse: (!) 122 (!) 111 (!) 115 (!) 116  Resp:  20 13 14   Temp:   98.6 F (37 C)   TempSrc:   Oral   SpO2: 97% 97% 95% 95%  Weight:      Height:        Intake/Output Summary (Last 24 hours) at 05/11/2020 1124 Last data filed at 05/10/2020 2341 Gross per 24 hour  Intake 1000 ml  Output --  Net 1000 ml   Filed Weights   05/10/20 1343  Weight: 90.7 kg    Examination:   General: Not in pain or dyspnea., deconditioned and ill looking appearing  Neurology: Awake and alert, non focal  E ENT: positive pallor, no icterus, oral mucosa dry Cardiovascular: No JVD. S1-S2  present, rhythmic, no gallops, rubs, or murmurs. No lower extremity edema. Pulmonary: positive breath sounds bilaterally, adequate air movement, no wheezing, rhonchi or rales. Gastrointestinal. Abdomen soft, mild tender to palpation with no organomegaly, no rebound or guarding Skin. No rashes Musculoskeletal: no joint deformities     Data Reviewed: I have personally reviewed following labs and imaging studies  CBC: Recent Labs  Lab 05/10/20 1356 05/10/20 2129 05/11/20 0506  WBC 14.3*  --  14.2*  NEUTROABS  --   --  11.9*  HGB 17.6* 16.0 14.8  HCT 53.7* 47.0 44.4  MCV 93.6  --  93.9  PLT 352  --  342   Basic Metabolic Panel: Recent Labs  Lab 05/10/20 1356 05/10/20 2118 05/10/20 2129 05/11/20 0506  NA 135 136 138 138  K 4.7 4.9 4.9 4.2  CL 96* 102  --  105  CO2 23 18*  --  21*  GLUCOSE 277* 350*  --  153*  BUN 11 19  --  16  CREATININE 1.09 1.18  --  1.00  CALCIUM 9.8 8.8*  --  8.9   GFR: Estimated Creatinine Clearance: 121.1 mL/min (by C-G formula based on SCr of 1 mg/dL). Liver Function Tests: Recent Labs  Lab 05/10/20 1356  AST 26  ALT 27  ALKPHOS 69  BILITOT 2.2*  PROT 7.7  ALBUMIN 4.6   Recent Labs  Lab 05/10/20 1356  LIPASE 16   No results for input(s): AMMONIA in the last 168 hours. Coagulation Profile: Recent Labs  Lab 05/11/20 0506  INR 1.1   Cardiac Enzymes: No results for input(s): CKTOTAL, CKMB, CKMBINDEX, TROPONINI in the last 168 hours. BNP (last 3 results) No results for input(s): PROBNP in the last 8760 hours. HbA1C: No results for input(s): HGBA1C in the last 72 hours. CBG: Recent Labs  Lab 05/11/20 0456 05/11/20 0617 05/11/20 0738 05/11/20 0947 05/11/20 1031  GLUCAP 153* 140* 158* 200* 210*   Lipid Profile: No results for input(s): CHOL, HDL, LDLCALC, TRIG, CHOLHDL, LDLDIRECT in the last 72 hours. Thyroid Function Tests: No results for input(s): TSH, T4TOTAL, FREET4, T3FREE, THYROIDAB in the last 72 hours. Anemia  Panel: No results for input(s): VITAMINB12, FOLATE, FERRITIN, TIBC, IRON, RETICCTPCT in the last 72 hours.    Radiology Studies: I have reviewed all of the imaging during this hospital visit personally     Scheduled Meds: . pantoprazole (PROTONIX) IV  40 mg Intravenous Q12H   Continuous Infusions: . sodium chloride Stopped (05/11/20 05/13/20)  . dextrose 5 % and 0.45% NaCl 125 mL/hr at 05/11/20 0634  . insulin 5 Units/hr (05/11/20 1007)     LOS: 0 days        Aamya Orellana 05/13/20, MD

## 2020-05-11 NOTE — Progress Notes (Signed)
Initial Nutrition Assessment  DOCUMENTATION CODES:   Not applicable  INTERVENTION:  Provide Boost Breeze po TID, each supplement provides 250 kcal and 9 grams of protein.  Encourage adequate PO intake.   NUTRITION DIAGNOSIS:   Inadequate oral intake related to nausea, vomiting as evidenced by per patient/family report.  GOAL:   Patient will meet greater than or equal to 90% of their needs  MONITOR:   PO intake, Supplement acceptance, Diet advancement, Skin, Weight trends, Labs, I & O's  REASON FOR ASSESSMENT:   Consult Assessment of nutrition requirement/status  ASSESSMENT:   34 year old male with significant past medical history for type 1 diabetes mellitus, and nicotine dependence who presented with intractable nausea and vomiting in DKA.  Pt is currently on a clear liquid diet. Pt reports no PO intake yet and requesting to rest. RD to order nutritional supplements to aid in caloric and protein needs. Pt with n/v 1 day prior to admission and unable to take PO. Pt with no significant weight loss per weight records. Labs and medications reviewed.   NUTRITION - FOCUSED PHYSICAL EXAM:    Most Recent Value  Orbital Region No depletion  Upper Arm Region No depletion  Thoracic and Lumbar Region No depletion  Buccal Region No depletion  Temple Region No depletion  Clavicle Bone Region No depletion  Clavicle and Acromion Bone Region No depletion  Scapular Bone Region No depletion  Dorsal Hand No depletion  Patellar Region No depletion  Anterior Thigh Region No depletion  Posterior Calf Region No depletion  Edema (RD Assessment) None  Hair Reviewed  Eyes Reviewed  Mouth Reviewed  Skin Reviewed  Nails Reviewed       Diet Order:   Diet Order            Diet clear liquid Room service appropriate? Yes; Fluid consistency: Thin  Diet effective now                 EDUCATION NEEDS:   Not appropriate for education at this time  Skin:  Skin Assessment: Reviewed  RN Assessment  Last BM:  Unknown  Height:   Ht Readings from Last 1 Encounters:  05/10/20 5\' 11"  (1.803 m)    Weight:   Wt Readings from Last 1 Encounters:  05/10/20 90.7 kg    BMI:  Body mass index is 27.89 kg/m.  Estimated Nutritional Needs:   Kcal:  2200-2400  Protein:  105-115 grams  Fluid:  >/= 2 L/day  05/12/20, MS, RD, LDN RD pager number/after hours weekend pager number on Amion.

## 2020-05-12 LAB — BASIC METABOLIC PANEL
Anion gap: 10 (ref 5–15)
BUN: 11 mg/dL (ref 6–20)
CO2: 25 mmol/L (ref 22–32)
Calcium: 8.8 mg/dL — ABNORMAL LOW (ref 8.9–10.3)
Chloride: 100 mmol/L (ref 98–111)
Creatinine, Ser: 0.96 mg/dL (ref 0.61–1.24)
GFR calc Af Amer: >60 mL/min (ref >60)
GFR calc non Af Amer: >60 mL/min (ref >60)
Glucose, Bld: 218 mg/dL — ABNORMAL HIGH (ref 70–99)
Potassium: 4.1 mmol/L (ref 3.5–5.1)
Sodium: 135 mmol/L (ref 135–145)

## 2020-05-12 LAB — GLUCOSE, CAPILLARY
Glucose-Capillary: 215 mg/dL — ABNORMAL HIGH (ref 70–99)
Glucose-Capillary: 251 mg/dL — ABNORMAL HIGH (ref 70–99)
Glucose-Capillary: 193 mg/dL — ABNORMAL HIGH (ref 70–99)
Glucose-Capillary: 176 mg/dL — ABNORMAL HIGH (ref 70–99)
Glucose-Capillary: 229 mg/dL — ABNORMAL HIGH (ref 70–99)

## 2020-05-12 MED ORDER — ENOXAPARIN SODIUM 40 MG/0.4ML ~~LOC~~ SOLN
40.0000 mg | SUBCUTANEOUS | Status: DC
  Administered 2020-05-12 – 2020-05-14 (×3): 40 mg via SUBCUTANEOUS
  Filled 2020-05-12 (×3): qty 0.4

## 2020-05-12 MED ORDER — METOCLOPRAMIDE HCL 5 MG/ML IJ SOLN
5.0000 mg | Freq: Once | INTRAMUSCULAR | Status: AC
  Administered 2020-05-12: 5 mg via INTRAVENOUS
  Filled 2020-05-12: qty 2

## 2020-05-12 MED ORDER — GLUCERNA SHAKE PO LIQD
237.0000 mL | Freq: Three times a day (TID) | ORAL | Status: DC
  Administered 2020-05-12: 237 mL via ORAL

## 2020-05-12 MED ORDER — PROMETHAZINE HCL 25 MG/ML IJ SOLN
12.5000 mg | Freq: Once | INTRAMUSCULAR | Status: AC
  Administered 2020-05-12: 12.5 mg via INTRAVENOUS
  Filled 2020-05-12: qty 1

## 2020-05-12 MED ORDER — PROMETHAZINE HCL 25 MG/ML IJ SOLN
25.0000 mg | INTRAMUSCULAR | Status: DC | PRN
  Administered 2020-05-12 – 2020-05-13 (×8): 25 mg via INTRAVENOUS
  Filled 2020-05-12 (×8): qty 1

## 2020-05-12 NOTE — Progress Notes (Signed)
Inpatient Diabetes Program Recommendations  AACE/ADA: New Consensus Statement on Inpatient Glycemic Control (2015)  Target Ranges:  Prepandial:   less than 140 mg/dL      Peak postprandial:   less than 180 mg/dL (1-2 hours)      Critically ill patients:  140 - 180 mg/dL   Lab Results  Component Value Date   GLUCAP 193 (H) 05/12/2020   HGBA1C 7.0 (H) 04/27/2020     Inpatient Diabetes Program Recommendations:     If to remain inpatient, consider increasing Lantus to 18 units BID.  Thanks, Lujean Rave, MSN, RNC-OB Diabetes Coordinator [phone] (8a-5p)

## 2020-05-12 NOTE — Progress Notes (Addendum)
Nutrition Follow-up  DOCUMENTATION CODES:   Not applicable  INTERVENTION:  Provide Glucerna Shake po TID, each supplement provides 220 kcal and 10 grams of protein.  Encourage adequate PO intake.   NUTRITION DIAGNOSIS:   Inadequate oral intake related to nausea, vomiting as evidenced by per patient/family report; ongoing  GOAL:   Patient will meet greater than or equal to 90% of their needs; progressing  MONITOR:   PO intake, Supplement acceptance, Diet advancement, Skin, Weight trends, Labs, I & O's  REASON FOR ASSESSMENT:   Consult Assessment of nutrition requirement/status  ASSESSMENT:   34 year old male with significant past medical history for type 1 diabetes mellitus, and nicotine dependence who presented with intractable nausea and vomiting in DKA.   RD working remotely.  Diet has been advanced to a soft diet. Pt currently has Boost Breeze ordered with varied intake. Pt continues with n/v with poor po. As diet has been advanced, RD to modify nutritional supplements to Glucerna Shake to aid in caloric and protein needs. Labs and medications reviewed.   Diet Order:   Diet Order            DIET SOFT Room service appropriate? Yes; Fluid consistency: Thin  Diet effective now                 EDUCATION NEEDS:   Not appropriate for education at this time  Skin:  Skin Assessment: Reviewed RN Assessment  Last BM:  7/15  Height:   Ht Readings from Last 1 Encounters:  05/10/20 5\' 11"  (1.803 m)    Weight:   Wt Readings from Last 1 Encounters:  05/12/20 87.3 kg   BMI:  Body mass index is 26.84 kg/m.  Estimated Nutritional Needs:   Kcal:  2200-2400  Protein:  105-115 grams  Fluid:  >/= 2 L/day  05/14/20, MS, RD, LDN RD pager number/after hours weekend pager number on Amion.

## 2020-05-12 NOTE — Progress Notes (Signed)
PROGRESS NOTE    Zachary Vazquez  YPP:[id number] DOB: 10-Dec-1985 DOA: 05/10/2020 PCP: [name], FNP    Brief Narrative:  Patient admitted to the hospital with working diagnosis of diabetes ketoacidosis.  34 year old male with significant past medical history for type 1 diabetes mellitus, and nicotine dependence who presented with intractable nausea and vomiting.  Unable to take any p.o. intake.  On his initial physical examination his blood pressure was 167/108, heart rate 150, respiratory 26, oxygen saturation 97%.  She had dry mucous membranes, her lungs are clear to auscultation bilaterally, heart S1-S2, present, tachycardic, abdomen tender at the epigastrium, soft, no guarding or rebound, no lower extremity edema. Sodium 135, potassium 4.7, chloride 96, bicarb 23, glucose 277, BUN 11, creatinine 1.0, anion gap 16, lipase 16, AST 26, ALT 27, white count 14.3, hemoglobin 17.6, hematocrit 53.7, platelets 352.  SARS COVID-19 negative. Urinalysis specific gravity 1.027, > 500 glucose.  Chest radiograph no infiltrates.  CT of the abdomen and pelvis with over distention of the colon, no bowel obstruction, normal appendix. EKG 125 bpm, normal axis, normal intervals, sinus rhythm, Q-wave lead I and aVL, poor R wave progression.  Patient has been transitioned to sq insulin, but continue to have persistent nausea and vomiting with very poor oral intake.    Assessment & Plan:   Principal Problem:   Uncontrolled type 1 diabetes mellitus with ketoacidosis without coma, with long-term current use of insulin (HCC) Active Problems:   Intractable nausea and vomiting   DKA (diabetic ketoacidoses) (HCC)   Nicotine dependence, cigarettes, uncomplicated   Mallory-Weiss tear    1. DKA, uncontrolled type 1 DM (Hgb A1 c 7.0). fasting glucose this am is 218, capillary 174, 215 and 251. Anion gap is 10 and K is 4,1.  Continue insulin therapy with basal 15 units bid and insulin sliding scale for  glucose cover and monitoring.   Patient continue to have very poor oral intake due to nausea and vomiting. Follow up renal function in am, electrolytes including Mg.  2. Intractable nausea and vomiting. Patient continue with persistent nausea and vomiting, with very poor oral intake. Continue to have tachycardia 114 bpm.   Continue scheduled metoclopramide 5 mg tid with meals, and will increase phenergan to 25 mg as needed every 4 H. Continue hydration with IV fluids, balanced electrolyte solutions, will increase rate to 100 ml per H. Continue antiacid therapy with pantoprazole IV and will change diet to soft.   3. Tobacco abuse. Smoking cessation     Status is: Inpatient  Remains inpatient appropriate because:IV treatments appropriate due to intensity of illness or inability to take PO   Dispo: The patient is from: Home              Anticipated d/c is to: Home              Anticipated d/c date is: 1 day              Patient currently is not medically stable to d/c.    DVT prophylaxis: Enoxaparin   Code Status:   full  Family Communication:  No family at the bedside      Nutrition Status: Nutrition Problem: Inadequate oral intake Etiology: nausea, vomiting Signs/Symptoms: per patient/family report Interventions: Boost Breeze     Subjective: Patient continue to have significant nausea and vomiting, not able to tolerate po , no chest pain or dyspnea.   Objective: Vitals:   05/11/20 2332 05/12/20 0000 05/12/20 4580 05/12/20 9983  BP: (!) 163/99 (!) 149/97 (!) 149/92 (!) 140/95  Pulse: (!) 114 (!) 107 99 (!) 118  Resp: 14 16 16 18   Temp: 99.3 F (37.4 C) 99.3 F (37.4 C) 98.9 F (37.2 C) 98.8 F (37.1 C)  TempSrc: Oral  Oral Oral  SpO2: 99% 96% 96% 96%  Weight:    87.3 kg  Height:        Intake/Output Summary (Last 24 hours) at 05/12/2020 1031 Last data filed at 05/12/2020 0436 Gross per 24 hour  Intake 1142.88 ml  Output 1300 ml  Net -157.12 ml   Filed  Weights   05/10/20 1343 05/12/20 0417  Weight: 90.7 kg 87.3 kg    Examination:   General: deconditioned and ill looking appearing  Neurology: Awake and alert, non focal  E ENT: mild pallor, no icterus, oral mucosa dry  Cardiovascular: No JVD. S1-S2 present, rhythmic,tachycardic with no gallops, rubs, or murmurs. No lower extremity edema. Pulmonary: positive breath sounds bilaterally, adequate air movement, no wheezing, rhonchi or rales. Gastrointestinal. Abdomen mild distended, non tender to superficial palpation. Skin. No rashes Musculoskeletal: no joint deformities     Data Reviewed: I have personally reviewed following labs and imaging studies  CBC: Recent Labs  Lab 05/10/20 1356 05/10/20 2129 05/11/20 0506  WBC 14.3*  --  14.2*  NEUTROABS  --   --  11.9*  HGB 17.6* 16.0 14.8  HCT 53.7* 47.0 44.4  MCV 93.6  --  93.9  PLT 352  --  342   Basic Metabolic Panel: Recent Labs  Lab 05/10/20 2118 05/10/20 2118 05/10/20 2129 05/11/20 0506 05/11/20 1103 05/11/20 1550 05/12/20 0424  NA 136   < > 138 138 135 137 135  K 4.9   < > 4.9 4.2 3.9 3.7 4.1  CL 102  --   --  105 104 105 100  CO2 18*  --   --  21* 22 23 25   GLUCOSE 350*  --   --  153* 215* 119* 218*  BUN 19  --   --  16 14 11 11   CREATININE 1.18  --   --  1.00 0.98 0.89 0.96  CALCIUM 8.8*  --   --  8.9 8.6* 8.8* 8.8*   < > = values in this interval not displayed.   GFR: Estimated Creatinine Clearance: 116.6 mL/min (by C-G formula based on SCr of 0.96 mg/dL). Liver Function Tests: Recent Labs  Lab 05/10/20 1356  AST 26  ALT 27  ALKPHOS 69  BILITOT 2.2*  PROT 7.7  ALBUMIN 4.6   Recent Labs  Lab 05/10/20 1356  LIPASE 16   No results for input(s): AMMONIA in the last 168 hours. Coagulation Profile: Recent Labs  Lab 05/11/20 0506  INR 1.1   Cardiac Enzymes: No results for input(s): CKTOTAL, CKMB, CKMBINDEX, TROPONINI in the last 168 hours. BNP (last 3 results) No results for input(s): PROBNP  in the last 8760 hours. HbA1C: No results for input(s): HGBA1C in the last 72 hours. CBG: Recent Labs  Lab 05/11/20 1746 05/11/20 2013 05/11/20 2331 05/12/20 0419 05/12/20 0751  GLUCAP 205* 153* 174* 215* 251*   Lipid Profile: No results for input(s): CHOL, HDL, LDLCALC, TRIG, CHOLHDL, LDLDIRECT in the last 72 hours. Thyroid Function Tests: No results for input(s): TSH, T4TOTAL, FREET4, T3FREE, THYROIDAB in the last 72 hours. Anemia Panel: No results for input(s): VITAMINB12, FOLATE, FERRITIN, TIBC, IRON, RETICCTPCT in the last 72 hours.    Radiology Studies: I have reviewed all  of the imaging during this hospital visit personally     Scheduled Meds: . feeding supplement  1 Container Oral TID BM  . insulin aspart  0-15 Units Subcutaneous TID WC  . insulin glargine  15 Units Subcutaneous BID  . metoCLOPramide (REGLAN) injection  5 mg Intravenous TID AC  . pantoprazole (PROTONIX) IV  40 mg Intravenous Q12H   Continuous Infusions: . lactated ringers 75 mL/hr at 05/11/20 1804     LOS: 1 day        Sumayya Muha Annett Gula, MD

## 2020-05-13 LAB — BASIC METABOLIC PANEL
Anion gap: 11 (ref 5–15)
BUN: 10 mg/dL (ref 6–20)
CO2: 26 mmol/L (ref 22–32)
Calcium: 8.7 mg/dL — ABNORMAL LOW (ref 8.9–10.3)
Chloride: 100 mmol/L (ref 98–111)
Creatinine, Ser: 1.01 mg/dL (ref 0.61–1.24)
GFR calc Af Amer: >60 mL/min (ref >60)
GFR calc non Af Amer: >60 mL/min (ref >60)
Glucose, Bld: 218 mg/dL — ABNORMAL HIGH (ref 70–99)
Potassium: 3.6 mmol/L (ref 3.5–5.1)
Sodium: 137 mmol/L (ref 135–145)

## 2020-05-13 LAB — GLUCOSE, CAPILLARY
Glucose-Capillary: 126 mg/dL — ABNORMAL HIGH (ref 70–99)
Glucose-Capillary: 132 mg/dL — ABNORMAL HIGH (ref 70–99)
Glucose-Capillary: 203 mg/dL — ABNORMAL HIGH (ref 70–99)
Glucose-Capillary: 227 mg/dL — ABNORMAL HIGH (ref 70–99)
Glucose-Capillary: 87 mg/dL (ref 70–99)

## 2020-05-13 LAB — MAGNESIUM: Magnesium: 1.8 mg/dL (ref 1.7–2.4)

## 2020-05-13 MED ORDER — MAGNESIUM SULFATE 2 GM/50ML IV SOLN
2.0000 g | Freq: Once | INTRAVENOUS | Status: AC
  Administered 2020-05-13: 2 g via INTRAVENOUS
  Filled 2020-05-13: qty 50

## 2020-05-13 MED ORDER — LABETALOL HCL 5 MG/ML IV SOLN: 5.0000 mg | INTRAVENOUS | Status: DC | PRN

## 2020-05-13 MED ORDER — AMLODIPINE BESYLATE 10 MG PO TABS
10.0000 mg | ORAL_TABLET | Freq: Every day | ORAL | Status: DC
  Administered 2020-05-13: 10 mg via ORAL
  Filled 2020-05-13: qty 1

## 2020-05-13 MED ORDER — POTASSIUM CHLORIDE CRYS ER 20 MEQ PO TBCR
40.0000 meq | EXTENDED_RELEASE_TABLET | Freq: Once | ORAL | Status: AC
  Administered 2020-05-13: 40 meq via ORAL
  Filled 2020-05-13: qty 2

## 2020-05-13 NOTE — Progress Notes (Signed)
BP trending up. Manual BP 150/98. SinusTach. Pt denies any headaches, dizziness. Pt c/o nausea. Administered antiemetic. Notified MD of elevated BP. Orders received to continue to monitor.

## 2020-05-13 NOTE — Plan of Care (Signed)

## 2020-05-13 NOTE — Progress Notes (Signed)
PROGRESS NOTE    Zachary Vazquez  ALP:[id number] DOB: 1985/12/03 DOA: 05/10/2020 PCP: [name], FNP    Brief Narrative:  Patient admitted to the hospital with working diagnosis of diabetes ketoacidosis.  34 year old male with significant past medical history for type 1 diabetes mellitus, and nicotine dependence who presented with intractable nausea and vomiting. Unable to take any p.o. intake. On his initial physical examination his blood pressure was 167/108, heart rate 150, respiratory 26, oxygen saturation 97%. She had dry mucous membranes, her lungs are clear to auscultation bilaterally, heart S1-S2, present, tachycardic, abdomen tender at the epigastrium, soft, no guarding or rebound, no lower extremity edema. Sodium 135, potassium 4.7, chloride 96, bicarb 23, glucose 277, BUN 11, creatinine 1.0, anion gap 16, lipase 16, AST 26, ALT 27,white count 14.3, hemoglobin 17.6, hematocrit53.7, platelets 352.SARS COVID-19 negative. Urinalysis specific gravity 1.027, > 500glucose. Chest radiograph no infiltrates. CT of the abdomen and pelvis with over distention of the colon, no bowel obstruction, normal appendix. EKG 125 bpm, normal axis, normal intervals, sinus rhythm, Q-wave lead I and aVL, poor R wave progression.  Patient has been transitioned to sq insulin, but continue to have persistent nausea and vomiting with very poor oral intake.     Assessment & Plan:   Principal Problem:   Uncontrolled type 1 diabetes mellitus with ketoacidosis without coma, with long-term current use of insulin (HCC) Active Problems:   Intractable nausea and vomiting   DKA (diabetic ketoacidoses) (HCC)   Nicotine dependence, cigarettes, uncomplicated   Mallory-Weiss tear     1. DKA, uncontrolled type 1 DM (Hgb A1 c 7.0). fasting glucose this am is 218, capillary 203 and 126. Anion gap is 11 and K is 3,6 and Mg is 1,8.  Continue to have nausea, vomiting and poor oral intake.  On basal  insulin 15 units bid plus insulin sliding scale for glucose cover and monitoring.   Not fully tolerating po yet.   2. Intractable nausea and vomiting.  Continue scheduled metoclopramide 5 mg tid with meals, and PRN phenergan to 25 mg every 4 H,  pantoprazole IV and soft diet.   Continue antiacid therapy BID for now.   3. Tobacco abuse. Continue with smoking cessation  4. Hypokalemia/ hypomagnesemia. Renal function with serum cr at 1.0 with K at 3,6 and Mg at 1,8 and serum bicarbonate 26. Will continue K correction with Kcl 40 meq x1 and will follow up on renal panel in am. Continue IV fluids with balanced electrolyte solutions. Add 2 g mag sulfate.   Follow up renal panel in am.   5. Hypertension. Patient with no diagnosis of hypertension, I suspect blood pressure elevation is reactive to nausea and vomiting, distress.   Will add labetalol as needed for blood pressure control.      Status is: Inpatient  Remains inpatient appropriate because:IV treatments appropriate due to intensity of illness or inability to take PO   Dispo: The patient is from: Home              Anticipated d/c is to: Home              Anticipated d/c date is: 2 days              Patient currently is not medically stable to d/c.  DVT prophylaxis: Enoxaparin   Code Status:   full  Family Communication:  No family at the bedside      Nutrition Status: Nutrition Problem: Inadequate oral intake Etiology: nausea,  vomiting Signs/Symptoms: per patient/family report Interventions: Boost Breeze      Subjective: Patient continue to have persistent nausea and vomiting with poor oral intake, his blood pressure has been elevated and continue to have resting tachycardia.   Objective: Vitals:   05/13/20 0037 05/13/20 0429 05/13/20 0824 05/13/20 1200  BP:  (!) 164/103 (!) 152/99 (!) 163/110  Pulse:  95  (!) 101  Resp: 14 20  14   Temp:  98.7 F (37.1 C) 98.4 F (36.9 C) 98.4 F (36.9 C)  TempSrc:   Oral Oral Oral  SpO2:  98%    Weight:      Height:        Intake/Output Summary (Last 24 hours) at 05/13/2020 1413 Last data filed at 05/13/2020 0659 Gross per 24 hour  Intake 1440 ml  Output 2840 ml  Net -1400 ml   Filed Weights   05/10/20 1343 05/12/20 0417  Weight: 90.7 kg 87.3 kg    Examination:   General: deconditioned and ill looking appearing  Neurology: Awake and alert, non focal  E ENT: no pallor, no icterus, oral mucosa moist Cardiovascular: No JVD. S1-S2 present, rhythmic, no gallops, rubs, or murmurs. No lower extremity edema. Pulmonary: positive breath sounds bilaterally, adequate air movement, no wheezing, rhonchi or rales. Gastrointestinal. Abdomen soft with, no organomegaly, non tender, no rebound or guarding Skin. No rashes Musculoskeletal: no joint deformities     Data Reviewed: I have personally reviewed following labs and imaging studies  CBC: Recent Labs  Lab 05/10/20 1356 05/10/20 2129 05/11/20 0506  WBC 14.3*  --  14.2*  NEUTROABS  --   --  11.9*  HGB 17.6* 16.0 14.8  HCT 53.7* 47.0 44.4  MCV 93.6  --  93.9  PLT 352  --  342   Basic Metabolic Panel: Recent Labs  Lab 05/11/20 0506 05/11/20 1103 05/11/20 1550 05/12/20 0424 05/13/20 0734  NA 138 135 137 135 137  K 4.2 3.9 3.7 4.1 3.6  CL 105 104 105 100 100  CO2 21* 22 23 25 26   GLUCOSE 153* 215* 119* 218* 218*  BUN 16 14 11 11 10   CREATININE 1.00 0.98 0.89 0.96 1.01  CALCIUM 8.9 8.6* 8.8* 8.8* 8.7*  MG  --   --   --   --  1.8   GFR: Estimated Creatinine Clearance: 110.8 mL/min (by C-G formula based on SCr of 1.01 mg/dL). Liver Function Tests: Recent Labs  Lab 05/10/20 1356  AST 26  ALT 27  ALKPHOS 69  BILITOT 2.2*  PROT 7.7  ALBUMIN 4.6   Recent Labs  Lab 05/10/20 1356  LIPASE 16   No results for input(s): AMMONIA in the last 168 hours. Coagulation Profile: Recent Labs  Lab 05/11/20 0506  INR 1.1   Cardiac Enzymes: No results for input(s): CKTOTAL, CKMB,  CKMBINDEX, TROPONINI in the last 168 hours. BNP (last 3 results) No results for input(s): PROBNP in the last 8760 hours. HbA1C: No results for input(s): HGBA1C in the last 72 hours. CBG: Recent Labs  Lab 05/12/20 1709 05/12/20 2042 05/12/20 2357 05/13/20 0822 05/13/20 1204  GLUCAP 176* 229* 227* 203* 126*   Lipid Profile: No results for input(s): CHOL, HDL, LDLCALC, TRIG, CHOLHDL, LDLDIRECT in the last 72 hours. Thyroid Function Tests: No results for input(s): TSH, T4TOTAL, FREET4, T3FREE, THYROIDAB in the last 72 hours. Anemia Panel: No results for input(s): VITAMINB12, FOLATE, FERRITIN, TIBC, IRON, RETICCTPCT in the last 72 hours.    Radiology Studies: I have reviewed  all of the imaging during this hospital visit personally     Scheduled Meds: . enoxaparin (LOVENOX) injection  40 mg Subcutaneous Q24H  . feeding supplement (GLUCERNA SHAKE)  237 mL Oral TID WC  . insulin aspart  0-15 Units Subcutaneous TID WC  . insulin glargine  15 Units Subcutaneous BID  . metoCLOPramide (REGLAN) injection  5 mg Intravenous TID AC  . pantoprazole (PROTONIX) IV  40 mg Intravenous Q12H   Continuous Infusions: . lactated ringers 100 mL/hr at 05/13/20 1257     LOS: 2 days        Maryam Feely Annett Gula, MD

## 2020-05-14 DIAGNOSIS — E1143 Type 2 diabetes mellitus with diabetic autonomic (poly)neuropathy: Secondary | ICD-10-CM

## 2020-05-14 DIAGNOSIS — K2970 Gastritis, unspecified, without bleeding: Secondary | ICD-10-CM

## 2020-05-14 DIAGNOSIS — K3184 Gastroparesis: Secondary | ICD-10-CM

## 2020-05-14 LAB — BASIC METABOLIC PANEL
Anion gap: 12 (ref 5–15)
BUN: 7 mg/dL (ref 6–20)
CO2: 26 mmol/L (ref 22–32)
Calcium: 9.1 mg/dL (ref 8.9–10.3)
Chloride: 101 mmol/L (ref 98–111)
Creatinine, Ser: 0.9 mg/dL (ref 0.61–1.24)
GFR calc Af Amer: >60 mL/min (ref >60)
GFR calc non Af Amer: >60 mL/min (ref >60)
Glucose, Bld: 134 mg/dL — ABNORMAL HIGH (ref 70–99)
Potassium: 3.8 mmol/L (ref 3.5–5.1)
Sodium: 139 mmol/L (ref 135–145)

## 2020-05-14 LAB — GLUCOSE, CAPILLARY
Glucose-Capillary: 125 mg/dL — ABNORMAL HIGH (ref 70–99)
Glucose-Capillary: 117 mg/dL — ABNORMAL HIGH (ref 70–99)
Glucose-Capillary: 138 mg/dL — ABNORMAL HIGH (ref 70–99)
Glucose-Capillary: 167 mg/dL — ABNORMAL HIGH (ref 70–99)

## 2020-05-14 MED ORDER — ONDANSETRON HCL 4 MG/2ML IJ SOLN
4.0000 mg | INTRAMUSCULAR | Status: DC | PRN
  Administered 2020-05-14: 4 mg via INTRAVENOUS
  Filled 2020-05-14: qty 2

## 2020-05-14 MED ORDER — PROMETHAZINE HCL 25 MG/ML IJ SOLN
25.0000 mg | Freq: Three times a day (TID) | INTRAMUSCULAR | Status: DC
  Administered 2020-05-14 – 2020-05-15 (×3): 25 mg via INTRAVENOUS
  Filled 2020-05-14 (×3): qty 1

## 2020-05-14 MED ORDER — SUCRALFATE 1 GM/10ML PO SUSP
1.0000 g | Freq: Three times a day (TID) | ORAL | Status: DC
  Administered 2020-05-14 – 2020-05-15 (×5): 1 g via ORAL
  Filled 2020-05-14 (×5): qty 10

## 2020-05-14 MED ORDER — AMLODIPINE BESYLATE 5 MG PO TABS
5.0000 mg | ORAL_TABLET | Freq: Every day | ORAL | Status: DC
  Administered 2020-05-14 – 2020-05-15 (×2): 5 mg via ORAL
  Filled 2020-05-14 (×2): qty 1

## 2020-05-14 NOTE — Progress Notes (Signed)
PROGRESS NOTE    Zachary Vazquez  VVO:[id number] DOB: [birth year] DOA: 05/10/2020 PCP: [name], FNP    Brief Narrative:  Patient admitted to the hospital with working diagnosis of diabetes ketoacidosis.  34 year old male with significant past medical history for type 1 diabetes mellitus, and nicotine dependence who presented with intractable nausea and vomiting. Unable to take any p.o. intake. On his initial physical examination his blood pressure was 167/108, heart rate 150, respiratory 26, oxygen saturation 97%. She had dry mucous membranes, her lungs are clear to auscultation bilaterally, heart S1-S2, present, tachycardic, abdomen tender at the epigastrium, soft, no guarding or rebound, no lower extremity edema. Sodium 135, potassium 4.7, chloride 96, bicarb 23, glucose 277, BUN 11, creatinine 1.0, anion gap 16, lipase 16, AST 26, ALT 27,white count 14.3, hemoglobin 17.6, hematocrit53.7, platelets 352.SARS COVID-19 negative. Urinalysis specific gravity 1.027, > 500glucose. Chest radiograph no infiltrates. CT of the abdomen and pelvis with over distention of the colon, no bowel obstruction, normal appendix. EKG 125 bpm, normal axis, normal intervals, sinus rhythm, Q-wave lead I and aVL, poor R wave progression.  Patient has been transitioned to sq insulin, but continue to have persistent nausea and vomiting with very poor oral intake.  Slowly improving but still not fully tolerating po due to nausea and vomiting.    Assessment & Plan:   Principal Problem:   Uncontrolled type 1 diabetes mellitus with ketoacidosis without coma, with long-term current use of insulin (HCC) Active Problems:   Intractable nausea and vomiting   DKA (diabetic ketoacidoses) (HCC)   Nicotine dependence, cigarettes, uncomplicated   Mallory-Weiss tear   Gastritis   Diabetic gastroparesis (HCC)    1. DKA, uncontrolled type 1 DM (Hgb A1 c 7.0). patient continue to have very poor oral  intake due to nausea and vomiting. Fasting glucose this am is 134, capillary 132, 125. Patient on a soft diet.   Continue current regimen of basal insulin 15 units bid plus insulin sliding scale for glucose cover and monitoring.   2. Intractable nausea and vomiting/ suspected acute gastritis/ esophagitis (non bleeding), diabetic gastroparesis.  Patient with persistent nausea and vomiting, not tolerating fully po diet.   Will change metoclopramide to phenergan with meals tid and add as needed ondansetron, continue bid pantoprazole and add tid sucralfate.  Encourage out of bed to chair tid with meals and ambulation in the hallway.   3. Tobacco abuse.Smoking cessation  4. Hypokalemia/ hypomagnesemia. Renal function continue to be stable with serum cr at 0,90, and K at 3,8 with serum bicarbonate at 26.   Will decrease IV fluids to 75 ml per H and continue to encourage po intake with nausea control.   5. Hypertension. His diastolic blood pressure has remained elevated, up to 107, will add low dose amlodipine and continue as needed labetalol.   Discontinue telemetry monitoring.    Status is: Inpatient  Remains inpatient appropriate because:IV treatments appropriate due to intensity of illness or inability to take PO   Dispo: The patient is from: Home              Anticipated d/c is to: Home              Anticipated d/c date is: 1 day              Patient currently is not medically stable to d/c.   DVT prophylaxis: Enoxaparin   Code Status:   full  Family Communication:  No family at the bedside  Nutrition Status: Nutrition Problem: Inadequate oral intake Etiology: nausea, vomiting Signs/Symptoms: per patient/family report Interventions: Boost Breeze       Subjective: Patient continue to have nausea and vomiting, mild improvement but still not yet back to baseline, no dyspnea or chest pain.   Objective: Vitals:   05/13/20 1955 05/14/20 0026 05/14/20 0638  05/14/20 0756  BP:  (!) 147/99 (!) 143/104 (!) 151/107  Pulse:  (!) 109 (!) 101 (!) 105  Resp: 20 20 17 17   Temp:  98.3 F (36.8 C) 98.1 F (36.7 C) 99 F (37.2 C)  TempSrc:  Oral Oral Oral  SpO2:  97% 98% 98%  Weight:      Height:        Intake/Output Summary (Last 24 hours) at 05/14/2020 1017 Last data filed at 05/14/2020 0659 Gross per 24 hour  Intake 1200 ml  Output 2600 ml  Net -1400 ml   Filed Weights   05/10/20 1343 05/12/20 0417  Weight: 90.7 kg 87.3 kg    Examination:   General: Not in pain or dyspnea. deconditioned  Neurology: Awake and alert, non focal  E ENT: mild pallor, no icterus, oral mucosa moist Cardiovascular: No JVD. S1-S2 present, rhythmic, no gallops, rubs, or murmurs. No lower extremity edema. Pulmonary: positive breath sounds bilaterally, adequate air movement, no wheezing, rhonchi or rales. Gastrointestinal. Abdomen soft not distended or tender.  Skin. No rashes Musculoskeletal: no joint deformities     Data Reviewed: I have personally reviewed following labs and imaging studies  CBC: Recent Labs  Lab 05/10/20 1356 05/10/20 2129 05/11/20 0506  WBC 14.3*  --  14.2*  NEUTROABS  --   --  11.9*  HGB 17.6* 16.0 14.8  HCT 53.7* 47.0 44.4  MCV 93.6  --  93.9  PLT 352  --  342   Basic Metabolic Panel: Recent Labs  Lab 05/11/20 1103 05/11/20 1550 05/12/20 0424 05/13/20 0734 05/14/20 0110  NA 135 137 135 137 139  K 3.9 3.7 4.1 3.6 3.8  CL 104 105 100 100 101  CO2 22 23 25 26 26   GLUCOSE 215* 119* 218* 218* 134*  BUN 14 11 11 10 7   CREATININE 0.98 0.89 0.96 1.01 0.90  CALCIUM 8.6* 8.8* 8.8* 8.7* 9.1  MG  --   --   --  1.8  --    GFR: Estimated Creatinine Clearance: 124.3 mL/min (by C-G formula based on SCr of 0.9 mg/dL). Liver Function Tests: Recent Labs  Lab 05/10/20 1356  AST 26  ALT 27  ALKPHOS 69  BILITOT 2.2*  PROT 7.7  ALBUMIN 4.6   Recent Labs  Lab 05/10/20 1356  LIPASE 16   No results for input(s): AMMONIA  in the last 168 hours. Coagulation Profile: Recent Labs  Lab 05/11/20 0506  INR 1.1   Cardiac Enzymes: No results for input(s): CKTOTAL, CKMB, CKMBINDEX, TROPONINI in the last 168 hours. BNP (last 3 results) No results for input(s): PROBNP in the last 8760 hours. HbA1C: No results for input(s): HGBA1C in the last 72 hours. CBG: Recent Labs  Lab 05/13/20 0822 05/13/20 1204 05/13/20 1654 05/13/20 2148 05/14/20 0755  GLUCAP 203* 126* 87 132* 125*   Lipid Profile: No results for input(s): CHOL, HDL, LDLCALC, TRIG, CHOLHDL, LDLDIRECT in the last 72 hours. Thyroid Function Tests: No results for input(s): TSH, T4TOTAL, FREET4, T3FREE, THYROIDAB in the last 72 hours. Anemia Panel: No results for input(s): VITAMINB12, FOLATE, FERRITIN, TIBC, IRON, RETICCTPCT in the last 72 hours.  Radiology Studies: I have reviewed all of the imaging during this hospital visit personally     Scheduled Meds: . enoxaparin (LOVENOX) injection  40 mg Subcutaneous Q24H  . feeding supplement (GLUCERNA SHAKE)  237 mL Oral TID WC  . insulin aspart  0-15 Units Subcutaneous TID WC  . insulin glargine  15 Units Subcutaneous BID  . pantoprazole (PROTONIX) IV  40 mg Intravenous Q12H  . promethazine  25 mg Intravenous TID AC  . sucralfate  1 g Oral TID WC & HS   Continuous Infusions: . lactated ringers 100 mL/hr at 05/14/20 0826     LOS: 3 days        Kassidy Dockendorf Annett Gula, MD

## 2020-05-15 LAB — GLUCOSE, CAPILLARY: Glucose-Capillary: 133 mg/dL — ABNORMAL HIGH (ref 70–99)

## 2020-05-15 MED ORDER — PANTOPRAZOLE SODIUM 40 MG PO TBEC: 40.0000 mg | DELAYED_RELEASE_TABLET | Freq: Every day | ORAL | 0 refills | Status: DC

## 2020-05-15 MED ORDER — PROMETHAZINE HCL 12.5 MG PO TABS: 12.5000 mg | ORAL_TABLET | Freq: Four times a day (QID) | ORAL | 0 refills | Status: DC | PRN

## 2020-05-15 MED ORDER — GLUCERNA SHAKE PO LIQD: 237.0000 mL | Freq: Three times a day (TID) | ORAL | 0 refills | Status: AC

## 2020-05-15 MED ORDER — PROMETHAZINE HCL 25 MG PO TABS: 12.5000 mg | ORAL_TABLET | Freq: Four times a day (QID) | ORAL | Status: DC | PRN

## 2020-05-15 MED ORDER — GLUCERNA SHAKE PO LIQD: 237.0000 mL | Freq: Three times a day (TID) | ORAL | 0 refills | Status: DC

## 2020-05-15 MED ORDER — PANTOPRAZOLE SODIUM 40 MG PO TBEC: 40.0000 mg | DELAYED_RELEASE_TABLET | Freq: Every day | ORAL | Status: DC

## 2020-05-15 MED FILL — PANTOPRAZOLE SOD DR 40 MG T: 40 | 14 days supply | Qty: 14 | Fill #0

## 2020-05-15 MED FILL — PROMETHAZINE 12.5 MG TABLET: 12.5 | 3 days supply | Qty: 15 | Fill #0

## 2020-05-15 NOTE — Progress Notes (Signed)
Gwenyth Ober to be D/C'd Home per MD order.  Discussed with the patient and all questions fully answered.  VSS, Skin clean, dry and intact without evidence of skin break down, no evidence of skin tears noted. IV catheter discontinued intact. Site without signs and symptoms of complications. Dressing and pressure applied.  An After Visit Summary was printed and given to the patient. Patient received prescription.  D/c education completed with patient/family including follow up instructions, medication list, d/c activities limitations if indicated, with other d/c instructions as indicated by MD - patient able to verbalize understanding, all questions fully answered.   Patient instructed to return to ED, call 911, or call MD for any changes in condition.   Patient escorted via WC, and D/C home via private auto.  Eligah East 05/15/2020 1:50 PM

## 2020-05-15 NOTE — TOC Progression Note (Signed)
Transition of Care [facility]) - Progression Note    Patient Details  Name: Zachary Vazquez MRN: [id number] Date of Birth: [birth year]  Transition of Care [facility]) CM/SW Contact  Janae Bridgeman, RN Phone Number: 05/15/2020, 11:55 AM  Clinical Narrative:    Case management placed patient's discharge medications in the [facility] program for assistance.  The patient is to be discharge today in the care of his mother.   Expected Discharge Plan: Home/Self Care Barriers to Discharge:  (Waiting on [facility] for medications to be filled for [facility])  Expected Discharge Plan and Services Expected Discharge Plan: Home/Self Care   Discharge Planning Services: Medication Assistance   Living arrangements for the past 2 months: Apartment Expected Discharge Date: 05/15/20                                     Social Determinants of Health (SDOH) Interventions    Readmission Risk Interventions No flowsheet data found.

## 2020-05-15 NOTE — Discharge Summary (Addendum)
Physician Discharge Summary  Zachary Vazquez NWG:[id number] DOB: 1986-07-01 DOA: 05/10/2020  PCP: [name], FNP  Admit date: 05/10/2020 Discharge date: 05/15/2020  Admitted From: Home Disposition:   Home   Recommendations for Outpatient Follow-up and new medication changes:  1. Follow up with [name], FNP 2. Continue as needed promethazine for nausea control. 3. Added pantoprazole to take daily for 14 days.  4. Follow blood pressure as outpatient.   Home Health: no   Equipment/Devices: no    Discharge Condition: stable  CODE STATUS: full  Diet recommendation:  Diabetic prudent  Brief/Interim Summary: Patient admitted to the hospital with working diagnosis of diabetes ketoacidosis, complicated with intractable nausea and vomiting/ gastritis.  34 year old male with significant past medical history for type 1 diabetes mellitus, and nicotine dependence who presented with intractable nausea and vomiting. Unable to take any p.o. On his initial physical examination his blood pressure was 167/108, heart rate 150, respiratory rate 26, oxygen saturation 97%. He had dry mucous membranes, his lungs were clear to auscultation bilaterally, heart S1-S2, present, tachycardic, abdomen tender at the epigastrium, soft, no guarding or rebound, no lower extremity edema. Sodium 135, potassium 4.7, chloride 96, bicarb 23, glucose 277, BUN 11, creatinine 1.0, anion gap 16, lipase 16, AST 26, ALT 27,white count 14.3, hemoglobin 17.6, hematocrit53.7, platelets 352.SARS COVID-19 negative. Urinalysis specific gravity 1.027, > 500glucose. Chest radiograph no infiltrates. CT of the abdomen and pelvis with over distention of the colon, no bowel obstruction, normal appendix. EKG 125 bpm, normal axis, normal intervals, sinus rhythm, Q-wave lead I and aVL, poor R wave progression, no st segment or t wave changes.   Patient was transitioned to sq insulin, but continue to have persistent nausea  and vomiting with very poor oral intake.  Patient required aggressive antiemetic and antiacid therapy, along with IV fluids. Slowly his symptoms improved and able to tolerate po by 05/15/20.    1.  Diabetes ketoacidosis, uncontrolled type 1 diabetes mellitus, hemoglobin A1c 7.0.  Patient received insulin IV with improvement of anion gap and serum glucose.  He was successfully transitioned to subcutaneous insulin, he received 15 units twice daily of basal long-acting insulin along with insulin sliding scale.  At discharge patient will resume his usual 30 units daily of basal insulin along with insulin sliding scale.  2.  Intractable nausea and vomiting, acute gastritis/esophagitis, (non-bleeding), diabetic gastroparesis.  Patient was placed on a soft diet, he required multiple antiemetic regimens (metoclopramide, ondansetron, promethazine) along with aggressive antiacid therapy with IV pantoprazole and PO sucralfate. Slowly his symptoms improved, patient will be discharged with as needed promethazine and daily pantoprazole.  3.  Hypokalemia/hypomagnesemia.  Electrolyte disturbances related to GI losses, patient received intravenous fluids with balance electrolyte solutions and potassium chloride/magnesium sulfate for electrolyte correction.  At discharge sodium 139, potassium 3.8, chloride 101, bicarb 26, glucose 134, BUN 7, creatinine 0.9, magnesium 1.8.  His p.o. intake has improved.  4.  Elevated blood pressure/hypertension.  Patient's blood pressure was elevated during his hospitalization.  He does not carry the diagnosis of hypertension.  He did receive amlodipine 5 mg while hospitalized for blood pressure control. I will recommend close follow-up of blood pressure as an outpatient, if persistently high will recommend to resume amlodipine.  5.  Tobacco abuse.  Continue smoking cessation.    Discharge Diagnoses:  Principal Problem:   Uncontrolled type 1 diabetes mellitus with  ketoacidosis without coma, with long-term current use of insulin (HCC) Active Problems:   Intractable nausea  and vomiting   DKA (diabetic ketoacidoses) (HCC)   Nicotine dependence, cigarettes, uncomplicated   Mallory-Weiss tear   Gastritis   Diabetic gastroparesis (HCC)    Discharge Instructions   Allergies as of 05/15/2020      Reactions   Clindamycin/lincomycin Anaphylaxis   Bactrim [sulfamethoxazole-trimethoprim] Swelling, Other (See Comments)   Eyes swell and bumps appear on/near the eyes       Medication List    STOP taking these medications   ondansetron 4 MG tablet Commonly known as: Zofran   triamcinolone cream 0.1 % Commonly known as: KENALOG     TAKE these medications   feeding supplement (GLUCERNA SHAKE) Liqd Take 237 mLs by mouth 3 (three) times daily with meals.   insulin aspart 100 UNIT/ML injection Commonly known as: NovoLOG INJECT 5 UNITS INTO THE SKIN WITH BREAKFAST, 7 UNITS WITH LUNCH, & 7 UNITS WITH DINNER. HOLD SHORT-ACTING INSULIN IF BLOOD SUGAR <125 What changed:   how much to take  how to take this  when to take this  additional instructions   insulin glargine 100 UNIT/ML injection Commonly known as: LANTUS Inject 0.3 mLs (30 Units total) into the skin at bedtime.   pantoprazole 40 MG tablet Commonly known as: PROTONIX Take 1 tablet (40 mg total) by mouth daily for 14 days. Start taking on: May 16, 2020   promethazine 12.5 MG tablet Commonly known as: PHENERGAN Take 1 tablet (12.5 mg total) by mouth every 6 (six) hours as needed for nausea or vomiting.       Allergies  Allergen Reactions  . Clindamycin/Lincomycin Anaphylaxis  . Bactrim [Sulfamethoxazole-Trimethoprim] Swelling and Other (See Comments)    Eyes swell and bumps appear on/near the eyes        Procedures/Studies: DG Abd 1 View  Result Date: 04/29/2020 CLINICAL DATA:  Nausea and vomiting EXAM: ABDOMEN - 1 VIEW COMPARISON:  Abdominal radiograph dated  05/03/2019. FINDINGS: The bowel gas pattern is normal. No radio-opaque calculi or other significant radiographic abnormality are seen. IMPRESSION: Negative. Electronically Signed   By: Romona Curls M.D.   On: 04/29/2020 09:51   CT ABDOMEN PELVIS W CONTRAST  Result Date: 05/11/2020 CLINICAL DATA:  34 year old male with acute abdominal pain. EXAM: CT ABDOMEN AND PELVIS WITH CONTRAST TECHNIQUE: Multidetector CT imaging of the abdomen and pelvis was performed using the standard protocol following bolus administration of intravenous contrast. CONTRAST:  OMNIPAQUE IOHEXOL 300 MG/ML  SOLN COMPARISON:  Abdominal radiograph dated 04/29/2020. FINDINGS: Lower chest: Minimal bibasilar dependent atelectasis. The visualized lung bases are otherwise clear. No intra-abdominal free air or free fluid. Hepatobiliary: Focal area of dystrophic calcification of the right lobe of the liver likely sequela of prior insult. There may be mild fatty infiltration of the liver. No intrahepatic biliary ductal dilatation. The gallbladder is unremarkable. Pancreas: The pancreas is atrophic. Spleen: Normal in size without focal abnormality. Adrenals/Urinary Tract: Adrenal glands are unremarkable. Kidneys are normal, without renal calculi, focal lesion, or hydronephrosis. Bladder is unremarkable. Stomach/Bowel: Mild diffuse thickened appearance of the colon likely related to underdistention. Colitis is less likely. Clinical correlation is recommended. There is no bowel obstruction. The appendix is normal. Vascular/Lymphatic: The abdominal aorta and IVC are unremarkable. No portal venous gas. There is no adenopathy. Reproductive: The prostate and seminal vesicles are grossly unremarkable. Other: None Musculoskeletal: No acute or significant osseous findings. IMPRESSION: 1. Underdistention of the colon versus less likely mild colitis. Clinical correlation is recommended. No bowel obstruction. Normal appendix. 2. Atrophic pancreas in keeping  with history of type 1 diabetes. Electronically Signed   By: Elgie Collard M.D.   On: 05/11/2020 02:39   DG Chest Portable 1 View  Result Date: 05/10/2020 CLINICAL DATA:  Diabetic ketoacidosis, nausea and vomiting EXAM: PORTABLE CHEST 1 VIEW COMPARISON:  None. FINDINGS: Single frontal view of the chest demonstrates an unremarkable cardiac silhouette. Numerous calcified granulomas are seen. No airspace disease, effusion, or pneumothorax. IMPRESSION: 1. No acute intrathoracic process. Electronically Signed   By: Sharlet Salina M.D.   On: 05/10/2020 23:03        Subjective: Patient is feeling better, his nausea and vomiting have improved and he is tolerating po well.   Discharge Exam: Vitals:   05/15/20 0400 05/15/20 0730  BP: (!) 139/96 (!) 141/96  Pulse: (!) 101 93  Resp: 18 18  Temp: 99 F (37.2 C) 98.2 F (36.8 C)  SpO2: 98% 97%   Vitals:   05/14/20 2033 05/15/20 0000 05/15/20 0400 05/15/20 0730  BP: (!) 144/98 (!) 143/99 (!) 139/96 (!) 141/96  Pulse: (!) 106 100 (!) 101 93  Resp: 17 20 18 18   Temp: 98.9 F (37.2 C) 99 F (37.2 C) 99 F (37.2 C) 98.2 F (36.8 C)  TempSrc: Oral Oral Oral Oral  SpO2: 98% 98% 98% 97%  Weight:      Height:        General: Not in pain or dyspnea  Neurology: Awake and alert, non focal  E ENT: no pallor, no icterus, oral mucosa moist Cardiovascular: No JVD. S1-S2 present, rhythmic, no gallops, rubs, or murmurs. No lower extremity edema. Pulmonary: vesicular breath sounds bilaterally, adequate air movement, no wheezing, rhonchi or rales. Gastrointestinal. Abdomen with, no organomegaly, non tender, no rebound or guarding Skin. No rashes Musculoskeletal: no joint deformities   The results of significant diagnostics from this hospitalization (including imaging, microbiology, ancillary and laboratory) are listed below for reference.     Microbiology: Recent Results (from the past 240 hour(s))  SARS Coronavirus 2 by RT PCR (hospital  order, performed in [facility] hospital lab) Nasopharyngeal Nasopharyngeal Swab     Status: None   Collection Time: 05/10/20  9:18 PM   Specimen: Nasopharyngeal Swab  Result Value Ref Range Status   SARS Coronavirus 2 NEGATIVE NEGATIVE Final    Comment: (NOTE) SARS-CoV-2 target nucleic acids are NOT DETECTED.  The SARS-CoV-2 RNA is generally detectable in upper and lower respiratory specimens during the acute phase of infection. The lowest concentration of SARS-CoV-2 viral copies this assay can detect is 250 copies / mL. A negative result does not preclude SARS-CoV-2 infection and should not be used as the sole basis for treatment or other patient management decisions.  A negative result may occur with improper specimen collection / handling, submission of specimen other than nasopharyngeal swab, presence of viral mutation(s) within the areas targeted by this assay, and inadequate number of viral copies (<250 copies / mL). A negative result must be combined with clinical observations, patient history, and epidemiological information.  Fact Sheet for Patients:   05/12/20  Fact Sheet for Healthcare Providers: BoilerBrush.com.cy  This test is not yet approved or  cleared by the https://pope.com/ FDA and has been authorized for detection and/or diagnosis of SARS-CoV-2 by FDA under an Emergency Use Authorization (EUA).  This EUA will remain in effect (meaning this test can be used) for the duration of the COVID-19 declaration under Section 564(b)(1) of the Act, 21 U.S.C. section 360bbb-3(b)(1), unless the authorization is terminated or revoked  sooner.  Performed at [facility], [address] [place], [place] [address]      Labs: BNP (last 3 results) No results for input(s): BNP in the last 8760 hours. Basic Metabolic Panel: Recent Labs  Lab 05/11/20 1103 05/11/20 1550 05/12/20 0424 05/13/20 0734 05/14/20 0110   NA 135 137 135 137 139  K 3.9 3.7 4.1 3.6 3.8  CL 104 105 100 100 101  CO2 22 23 25 26 26   GLUCOSE 215* 119* 218* 218* 134*  BUN 14 11 11 10 7   CREATININE 0.98 0.89 0.96 1.01 0.90  CALCIUM 8.6* 8.8* 8.8* 8.7* 9.1  MG  --   --   --  1.8  --    Liver Function Tests: Recent Labs  Lab 05/10/20 1356  AST 26  ALT 27  ALKPHOS 69  BILITOT 2.2*  PROT 7.7  ALBUMIN 4.6   Recent Labs  Lab 05/10/20 1356  LIPASE 16   No results for input(s): AMMONIA in the last 168 hours. CBC: Recent Labs  Lab 05/10/20 1356 05/10/20 2129 05/11/20 0506  WBC 14.3*  --  14.2*  NEUTROABS  --   --  11.9*  HGB 17.6* 16.0 14.8  HCT 53.7* 47.0 44.4  MCV 93.6  --  93.9  PLT 352  --  342   Cardiac Enzymes: No results for input(s): CKTOTAL, CKMB, CKMBINDEX, TROPONINI in the last 168 hours. BNP: Invalid input(s): POCBNP CBG: Recent Labs  Lab 05/14/20 0755 05/14/20 1250 05/14/20 1730 05/14/20 2158 05/15/20 0727  GLUCAP 125* 117* 138* 167* 133*   D-Dimer No results for input(s): DDIMER in the last 72 hours. Hgb A1c No results for input(s): HGBA1C in the last 72 hours. Lipid Profile No results for input(s): CHOL, HDL, LDLCALC, TRIG, CHOLHDL, LDLDIRECT in the last 72 hours. Thyroid function studies No results for input(s): TSH, T4TOTAL, T3FREE, THYROIDAB in the last 72 hours.  Invalid input(s): FREET3 Anemia work up No results for input(s): VITAMINB12, FOLATE, FERRITIN, TIBC, IRON, RETICCTPCT in the last 72 hours. Urinalysis    Component Value Date/Time   COLORURINE YELLOW 05/10/2020 1955   APPEARANCEUR CLEAR 05/10/2020 1955   LABSPEC 1.027 05/10/2020 1955   PHURINE 5.0 05/10/2020 1955   GLUCOSEU >=500 (A) 05/10/2020 1955   HGBUR NEGATIVE 05/10/2020 1955   BILIRUBINUR NEGATIVE 05/10/2020 1955   BILIRUBINUR Negative 11/29/2019 1348   KETONESUR 80 (A) 05/10/2020 1955   PROTEINUR NEGATIVE 05/10/2020 1955   UROBILINOGEN 0.2 11/29/2019 1348   UROBILINOGEN 0.2 01/19/2018 0838   NITRITE  NEGATIVE 05/10/2020 1955   LEUKOCYTESUR NEGATIVE 05/10/2020 1955   Sepsis Labs Invalid input(s): PROCALCITONIN,  WBC,  LACTICIDVEN Microbiology Recent Results (from the past 240 hour(s))  SARS Coronavirus 2 by RT PCR (hospital order, performed in [facility] hospital lab) Nasopharyngeal Nasopharyngeal Swab     Status: None   Collection Time: 05/10/20  9:18 PM   Specimen: Nasopharyngeal Swab  Result Value Ref Range Status   SARS Coronavirus 2 NEGATIVE NEGATIVE Final    Comment: (NOTE) SARS-CoV-2 target nucleic acids are NOT DETECTED.  The SARS-CoV-2 RNA is generally detectable in upper and lower respiratory specimens during the acute phase of infection. The lowest concentration of SARS-CoV-2 viral copies this assay can detect is 250 copies / mL. A negative result does not preclude SARS-CoV-2 infection and should not be used as the sole basis for treatment or other patient management decisions.  A negative result may occur with improper specimen collection / handling, submission of specimen  other than nasopharyngeal swab, presence of viral mutation(s) within the areas targeted by this assay, and inadequate number of viral copies (<250 copies / mL). A negative result must be combined with clinical observations, patient history, and epidemiological information.  Fact Sheet for Patients:   BoilerBrush.com.cy  Fact Sheet for Healthcare Providers: https://pope.com/  This test is not yet approved or  cleared by the [place] FDA and has been authorized for detection and/or diagnosis of SARS-CoV-2 by FDA under an Emergency Use Authorization (EUA).  This EUA will remain in effect (meaning this test can be used) for the duration of the COVID-19 declaration under Section 564(b)(1) of the Act, 21 U.S.C. section 360bbb-3(b)(1), unless the authorization is terminated or revoked sooner.  Performed at [facility], [address]  [place], [place] [address]      Time coordinating discharge: 45 minutes  SIGNED:   Coralie Keens, MD  [facility] 05/15/2020, 9:20 AM

## 2020-05-22 ENCOUNTER — Other Ambulatory Visit: Payer: Self-pay | Admitting: Family Medicine

## 2020-05-22 DIAGNOSIS — E109 Type 1 diabetes mellitus without complications: Secondary | ICD-10-CM

## 2020-05-22 MED FILL — $novoLOG 100 UNITS/ML VIAL: 100 | 28 days supply | Qty: 10 | Fill #5

## 2020-05-22 MED FILL — $LANTUS 100 UNITS/ML VIAL: 100 | 28 days supply | Qty: 10 | Fill #6

## 2020-05-22 NOTE — Telephone Encounter (Signed)
Medication: insulin glargine (LANTUS) 100 UNIT/ML injection [[id number]] , insulin aspart (NOVOLOG) 100 UNIT/ML injection [[id number]]   Has the patient contacted their pharmacy? Yes  (Agent: If no, request that the patient contact the pharmacy for the refill.) (Agent: If yes, when and what did the pharmacy advise?)  Preferred Pharmacy (with phone number or street name):   [facility] - [place], [place] - [address]  Phone:  [phone] Fax:  [phone]     Agent: Please be advised that RX refills may take up to 3 business days. We ask that you follow-up with your pharmacy.

## 2020-05-26 MED ORDER — INSULIN ASPART 100 UNIT/ML ~~LOC~~ SOLN: SUBCUTANEOUS | 11 refills | Status: DC

## 2020-06-12 ENCOUNTER — Telehealth: Payer: Self-pay | Admitting: Family Medicine

## 2020-06-12 MED FILL — !NOVOLOG 100UNITS/ML VIAL: 100/ML | 28 days supply | Qty: 10 | Fill #6

## 2020-06-12 NOTE — Telephone Encounter (Signed)
Medication Refill - Medication:  insulin glargine (LANTUS) 100 UNIT/ML injection [[id number]]   insulin aspart (NOVOLOG) 100 UNIT/ML injection [[id number]]   Preferred Pharmacy (with phone number or street name):  [facility] - [place], [place] - [address]  [address] [place] [address]  Phone: [phone] Fax: [phone]     Agent: Please be advised that RX refills may take up to 3 business days. We ask that you follow-up with your pharmacy.

## 2020-06-14 ENCOUNTER — Other Ambulatory Visit: Payer: Self-pay | Admitting: Family Medicine

## 2020-06-14 DIAGNOSIS — E109 Type 1 diabetes mellitus without complications: Secondary | ICD-10-CM

## 2020-06-14 MED ORDER — INSULIN GLARGINE 100 UNIT/ML ~~LOC~~ SOLN
30.0000 [IU] | Freq: Every day | SUBCUTANEOUS | 12 refills | Status: DC
  Filled 2021-02-08: qty 10, 33d supply, fill #0
  Filled 2021-03-27: qty 10, 28d supply, fill #1
  Filled 2021-03-27: qty 10, 33d supply, fill #1
  Filled 2021-04-23: qty 10, 28d supply, fill #2
  Filled 2021-06-14: qty 10, 28d supply, fill #3

## 2020-06-14 MED ORDER — INSULIN ASPART 100 UNIT/ML ~~LOC~~ SOLN: SUBCUTANEOUS | 11 refills | Status: DC

## 2020-06-15 MED FILL — $LANTUS 100 UNITS/ML VIAL: 100 | 30 days supply | Qty: 10 | Fill #0

## 2020-06-15 MED FILL — !NOVOLOG 100UNITS/ML VIAL: 100/ML | 28 days supply | Qty: 10 | Fill #0

## 2020-07-04 MED FILL — NovoLOG 100 UNIT/ML SOLN: 100 | 28 days supply | Qty: 10 | Fill #0

## 2020-07-04 MED FILL — $LANTUS 100 UNITS/ML VIAL: 100 | 30 days supply | Qty: 10 | Fill #0

## 2020-07-28 MED FILL — NovoLOG 100 UNIT/ML SOLN: 100 | 28 days supply | Qty: 10 | Fill #1

## 2020-07-28 MED FILL — $LANTUS 100 UNITS/ML VIAL: 100 | 30 days supply | Qty: 10 | Fill #1

## 2020-08-10 ENCOUNTER — Other Ambulatory Visit: Payer: Self-pay

## 2020-08-10 MED FILL — IBUPROFEN 400 MG TAB: 400 | 2 days supply | Qty: 16 | Fill #0

## 2020-08-10 MED FILL — PREVIDENT 5000 1.1% DRY MOU: 1.1 | 30 days supply | Qty: 100 | Fill #0

## 2020-08-28 ENCOUNTER — Other Ambulatory Visit: Payer: Self-pay

## 2020-08-28 MED FILL — IBUPROFEN 400 MG TAB: 400 | 2 days supply | Qty: 16 | Fill #0

## 2020-08-28 MED FILL — !LANTUS 100 UNITS/ML VIAL: 100 | 30 days supply | Qty: 10 | Fill #2

## 2020-08-28 MED FILL — INSULIN ASPART 100 UNIT/ML: 100 | 28 days supply | Qty: 10 | Fill #2

## 2020-09-15 MED FILL — NovoLOG 100 UNIT/ML SOLN: 100 | 28 days supply | Qty: 10 | Fill #7

## 2020-09-27 DIAGNOSIS — E559 Vitamin D deficiency, unspecified: Secondary | ICD-10-CM

## 2020-09-27 HISTORY — DX: Vitamin D deficiency, unspecified: E55.9

## 2020-10-05 MED FILL — $novoLOG 100 UNITS/ML VIAL: 100 | 28 days supply | Qty: 10 | Fill #8

## 2020-10-05 MED FILL — $LANTUS 100 UNITS/ML VIAL: 100 | 28 days supply | Qty: 10 | Fill #3

## 2020-10-11 ENCOUNTER — Ambulatory Visit: Payer: Self-pay | Admitting: Family Medicine

## 2020-10-11 ENCOUNTER — Other Ambulatory Visit: Payer: Self-pay

## 2020-10-11 ENCOUNTER — Encounter: Payer: Self-pay | Admitting: Family Medicine

## 2020-10-11 ENCOUNTER — Ambulatory Visit (INDEPENDENT_AMBULATORY_CARE_PROVIDER_SITE_OTHER): Payer: Self-pay | Admitting: Family Medicine

## 2020-10-11 ENCOUNTER — Other Ambulatory Visit: Payer: Self-pay | Admitting: Family Medicine

## 2020-10-11 VITALS — BP 121/73 | HR 90 | Temp 97.0°F | Ht 71.0 in | Wt 191.0 lb

## 2020-10-11 DIAGNOSIS — E1010 Type 1 diabetes mellitus with ketoacidosis without coma: Secondary | ICD-10-CM

## 2020-10-11 DIAGNOSIS — Z09 Encounter for follow-up examination after completed treatment for conditions other than malignant neoplasm: Secondary | ICD-10-CM

## 2020-10-11 DIAGNOSIS — E109 Type 1 diabetes mellitus without complications: Secondary | ICD-10-CM

## 2020-10-11 DIAGNOSIS — R21 Rash and other nonspecific skin eruption: Secondary | ICD-10-CM

## 2020-10-11 DIAGNOSIS — R7309 Other abnormal glucose: Secondary | ICD-10-CM

## 2020-10-11 MED ORDER — FLUOCINONIDE 0.05 % EX CREA: 1.0000 "application " | TOPICAL_CREAM | Freq: Two times a day (BID) | CUTANEOUS | 3 refills | Status: DC

## 2020-10-11 MED ORDER — INSULIN ASPART 100 UNIT/ML ~~LOC~~ SOLN
SUBCUTANEOUS | 11 refills | Status: DC
  Filled 2021-02-08: qty 10, 28d supply, fill #0

## 2020-10-11 NOTE — Progress Notes (Signed)
[facility]   Hospital Follow Up  Subjective:  Patient ID: Zachary Vazquez, male    DOB: 1986-02-04  Age: 34 y.o. MRN: [id number]  CC:  Chief Complaint  Patient presents with  . Follow-up    HPI Zachary Vazquez is a 34 year old male who presents for Hospital Follow Up today.    Patient Active Problem List   Diagnosis Date Noted  . Gastritis 05/14/2020  . Diabetic gastroparesis (HCC) 05/14/2020  . Uncontrolled type 1 diabetes mellitus with ketoacidosis without coma, with long-term current use of insulin (HCC) 05/10/2020  . SIRS (systemic inflammatory response syndrome) (HCC) 05/10/2020  . Nicotine dependence, cigarettes, uncomplicated 05/10/2020  . 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  . Hyperglycemia 04/27/2020  . History of delayed wound healing 04/16/2020  . Hemoglobin A1c less than 7.0% 04/16/2020  . Allergies 04/16/2020  . Rash and nonspecific skin eruption 11/23/2019  . Spider bite 11/18/2019  . Wound of left leg 11/18/2019  . Intractable nausea and vomiting 05/04/2019  . DKA, type 1 (HCC) 05/04/2019  . DKA (diabetic ketoacidosis) (HCC) 05/04/2019  . Nausea 12/02/2017  . Tobacco use disorder 03/30/2017  . Type 1 diabetes mellitus (HCC) 03/30/2017  . Leukocytosis 03/30/2017    Current Status: Since his last office visit, he has had several Hospital Admissions for Diabetic Ketoacidosis in 04/2020. Today, he is doing well with no complaints.He has c/o skin rash over entire body, which breakouts over different parts of body sporadically. He also reports extreme itching. He has been using prescribed Hydrocortisone cream with no relief. He denies fatigue, frequent urination, blurred vision, excessive hunger, excessive thirst, weight gain, weight loss, and poor wound healing. He continues to check his feet regularly. He denies fevers, chills, recent infections, weight loss, and night sweats. He has not had any headaches, dizziness, and  falls. No chest pain, heart palpitations, cough and shortness of breath reported. Denies GI problems such as nausea, vomiting, diarrhea, and constipation. He has no reports of blood in stools, dysuria and hematuria. No depression or anxiety reported today. He is taking all medications as prescribed. He denies pain today.   Past Medical History:  Diagnosis Date  . History of delayed wound healing 10/2019  . Rash and nonspecific skin eruption 10/2019  . Type 1 diabetes mellitus (HCC)   . Wound of lower extremity, left, sequela 10/2019    Past Surgical History:  Procedure Laterality Date  . TONSILLECTOMY      Family History  Problem Relation Age of Onset  . Hypertension Mother   . Diabetes Mellitus I Brother     Social History   Socioeconomic History  . Marital status: Single    Spouse name: Not on file  . Number of children: Not on file  . Years of education: Not on file  . Highest education level: Not on file  Occupational History  . Not on file  Tobacco Use  . Smoking status: Current Every Day Smoker    Packs/day: 0.25    Years: 15.00    Pack years: 3.75    Types: Cigarettes  . Smokeless tobacco: Never Used  Vaping Use  . Vaping Use: Never used  Substance and Sexual Activity  . Alcohol use: Yes    Comment: occ  . Drug use: Not Currently    Types: Marijuana    Comment: occ  . Sexual activity: Yes    Birth control/protection: None  Other Topics Concern  . Not on file  Social History Narrative  . Not on file   Social Determinants of Health   Financial Resource Strain: Not on file  Food Insecurity: Not on file  Transportation Needs: Not on file  Physical Activity: Not on file  Stress: Not on file  Social Connections: Not on file  Intimate Partner Violence: Not on file    Outpatient Medications Prior to Visit  Medication Sig Dispense Refill  . insulin glargine (LANTUS) 100 UNIT/ML injection Inject 0.3 mLs (30 Units total) into the skin at bedtime. 10 mL 12   . insulin aspart (NOVOLOG) 100 UNIT/ML injection INJECT 5 UNITS INTO THE SKIN WITH BREAKFAST, 7 UNITS WITH LUNCH, & 7 UNITS WITH DINNER. HOLD SHORT-ACTING INSULIN IF BLOOD SUGAR <125 20 mL 11  . pantoprazole (PROTONIX) 40 MG tablet Take 1 tablet (40 mg total) by mouth daily for 14 days. 14 tablet 0  . promethazine (PHENERGAN) 12.5 MG tablet Take 1 tablet (12.5 mg total) by mouth every 6 (six) hours as needed for nausea or vomiting. (Patient not taking: Reported on 10/11/2020) 15 tablet 0   No facility-administered medications prior to visit.    Allergies  Allergen Reactions  . Clindamycin/Lincomycin Anaphylaxis  . Bactrim [Sulfamethoxazole-Trimethoprim] Swelling and Other (See Comments)    Eyes swell and bumps appear on/near the eyes     ROS Review of Systems  Constitutional: Negative.   HENT: Negative.   Eyes: Negative.   Respiratory: Negative.   Cardiovascular: Negative.   Gastrointestinal: Negative.   Endocrine: Negative.   Genitourinary: Negative.   Musculoskeletal: Negative.   Skin: Positive for rash (over entire extremities).  Allergic/Immunologic: Negative.   Neurological: Positive for dizziness (occasional ) and headaches (occasional ).  Hematological: Negative.   Psychiatric/Behavioral: Negative.       Objective:    Physical Exam Vitals and nursing note reviewed.  Constitutional:      Appearance: Normal appearance.  HENT:     Head: Normocephalic and atraumatic.     Nose: Nose normal.     Mouth/Throat:     Mouth: Mucous membranes are moist.     Pharynx: Oropharynx is clear.  Cardiovascular:     Rate and Rhythm: Normal rate and regular rhythm.     Pulses: Normal pulses.     Heart sounds: Normal heart sounds.  Pulmonary:     Effort: Pulmonary effort is normal.     Breath sounds: Normal breath sounds.  Abdominal:     General: Bowel sounds are normal.     Palpations: Abdomen is soft.  Musculoskeletal:        General: Normal range of motion.     Cervical  back: Normal range of motion and neck supple.  Skin:    General: Skin is warm and dry.     Findings: Rash present.       Neurological:     General: No focal deficit present.     Mental Status: He is alert and oriented to person, place, and time.  Psychiatric:        Mood and Affect: Mood normal.        Behavior: Behavior normal.        Thought Content: Thought content normal.        Judgment: Judgment normal.     BP 121/73   Pulse 90   Temp (!) 97 F (36.1 C) (Temporal)   Ht 5\' 11"  (1.803 m)   Wt 191 lb (86.6 kg)   SpO2 97%   BMI 26.64 kg/m  Wt Readings from Last 3 Encounters:  10/11/20 191 lb (86.6 kg)  05/12/20 192 lb 7.4 oz (87.3 kg)  04/27/20 200 lb (90.7 kg)     Health Maintenance Due  Topic Date Due  . Hepatitis C Screening  Never done  . PNEUMOCOCCAL POLYSACCHARIDE VACCINE AGE 1-64 HIGH RISK  Never done  . COVID-19 Vaccine (1) Never done  . OPHTHALMOLOGY EXAM  Never done  . TETANUS/TDAP  Never done  . FOOT EXAM  12/22/2018  . INFLUENZA VACCINE  Never done  . URINE MICROALBUMIN  10/03/2020    There are no preventive care reminders to display for this patient.  Lab Results  Component Value Date   TSH 1.390 10/11/2020   Lab Results  Component Value Date   WBC 6.9 10/11/2020   HGB 16.1 10/11/2020   HCT 47.1 10/11/2020   MCV 91 10/11/2020   PLT 305 10/11/2020   Lab Results  Component Value Date   NA 140 10/11/2020   K 4.8 10/11/2020   CO2 26 10/11/2020   GLUCOSE 50 (L) 10/11/2020   BUN 20 10/11/2020   CREATININE 1.31 (H) 10/11/2020   BILITOT 0.6 10/11/2020   ALKPHOS 88 10/11/2020   AST 21 10/11/2020   ALT 29 10/11/2020   PROT 8.0 10/11/2020   ALBUMIN 5.1 (H) 10/11/2020   CALCIUM 10.2 10/11/2020   ANIONGAP 12 05/14/2020   Lab Results  Component Value Date   CHOL 195 10/11/2020   Lab Results  Component Value Date   HDL 42 10/11/2020   Lab Results  Component Value Date   LDLCALC 139 (H) 10/11/2020   Lab Results  Component Value  Date   TRIG 74 10/11/2020   Lab Results  Component Value Date   CHOLHDL 4.6 10/11/2020   Lab Results  Component Value Date   HGBA1C 6.1 (H) 10/11/2020   Assessment & Plan:    1. Hospital discharge follow-up Patient is uninsured keep getting denial for Medicaid.   2. Diabetic ketoacidosis without coma associated with type 1 diabetes mellitus (HCC) Stable today. We will refer patient to Endocrinology for assessment of Insulin Pump. Monitor.  - Hemoglobin A1c - insulin aspart (NOVOLOG) 100 UNIT/ML injection; Use insulin sliding scale  Dispense: 20 mL; Refill: 11 - Urinalysis - Ambulatory referral to Endocrinology  3. Type 1 diabetes mellitus without complication (HCC) We will inquire if he qualifies for insulin pump. We will also refer him to Endocrinology. Monitor. - CBC with Differential - Comprehensive metabolic panel - Lipid Panel - TSH - Vitamin B12 - Vitamin D, 25-hydroxy - Hemoglobin A1c - insulin aspart (NOVOLOG) 100 UNIT/ML injection; Use insulin sliding scale  Dispense: 20 mL; Refill: 11 - Urinalysis - Ambulatory referral to Endocrinology  4. Hemoglobin A1c less than 7.0% Hgb A1c at 6.1 today. Monitor.  - Hemoglobin A1c - insulin aspart (NOVOLOG) 100 UNIT/ML injection; Use insulin sliding scale  Dispense: 20 mL; Refill: 11 - Urinalysis - Ambulatory referral to Endocrinology  5. Skin rash - fluocinonide cream (LIDEX) 0.05 %; Apply 1 application topically 2 (two) times daily.  Dispense: 30 g; Refill: 3 - Ambulatory referral to Dermatology  6. Follow up He will follow up in 3 months.    Meds ordered this encounter  Medications  . insulin aspart (NOVOLOG) 100 UNIT/ML injection    Sig: Use insulin sliding scale    Dispense:  20 mL    Refill:  11  . fluocinonide cream (LIDEX) 0.05 %    Sig: Apply 1 application topically  2 (two) times daily.    Dispense:  30 g    Refill:  3   Orders Placed This Encounter  Procedures  . CBC with Differential  .  Comprehensive metabolic panel  . Lipid Panel  . TSH  . Vitamin B12  . Vitamin D, 25-hydroxy  . Hemoglobin A1c  . Urinalysis  . Ambulatory referral to Dermatology  . Ambulatory referral to Endocrinology     Referral Orders     Ambulatory referral to Dermatology     Ambulatory referral to Endocrinology   Raliegh Ip, MSN, ANE, FNP-BC [facility] [address] [place], [place] [address] [phone] fax   Problem List Items Addressed This Visit      Endocrine   DKA (diabetic ketoacidosis) (HCC)   Relevant Medications   insulin aspart (NOVOLOG) 100 UNIT/ML injection   Other Relevant Orders   Hemoglobin A1c (Completed)   Urinalysis (Completed)   Ambulatory referral to Endocrinology   Type 1 diabetes mellitus (HCC)   Relevant Medications   insulin aspart (NOVOLOG) 100 UNIT/ML injection   Other Relevant Orders   CBC with Differential (Completed)   Comprehensive metabolic panel (Completed)   Lipid Panel (Completed)   TSH (Completed)   Vitamin B12 (Completed)   Vitamin D, 25-hydroxy (Completed)   Hemoglobin A1c (Completed)   Urinalysis (Completed)   Ambulatory referral to Endocrinology     Other   Hemoglobin A1c less than 7.0%   Relevant Medications   insulin aspart (NOVOLOG) 100 UNIT/ML injection   Other Relevant Orders   Hemoglobin A1c (Completed)   Urinalysis (Completed)   Ambulatory referral to Endocrinology    Other Visit Diagnoses    Hospital discharge follow-up    -  Primary   Skin rash       Relevant Medications   fluocinonide cream (LIDEX) 0.05 %   Other Relevant Orders   Ambulatory referral to Dermatology   Follow up          Meds ordered this encounter  Medications  . insulin aspart (NOVOLOG) 100 UNIT/ML injection    Sig: Use insulin sliding scale    Dispense:  20 mL    Refill:  11  . fluocinonide cream (LIDEX) 0.05 %    Sig: Apply 1  application topically 2 (two) times daily.    Dispense:  30 g    Refill:  3    Follow-up: Return in about 3 months (around 01/09/2021).    [name], FNP

## 2020-10-11 NOTE — Patient Instructions (Addendum)
Psoriasis Psoriasis is a long-term (chronic) skin condition. It occurs because your body's defense system (immune system) causes skin cells to form too quickly. This causes raised, red patches (plaques) on your skin that look silvery. The patches may be on all areas of your body. They can be any size or shape. Psoriasis can come and go. It can range from mild to very bad. It cannot be passed from one person to another (is not contagious). There is no cure for this condition, but it can be helped with treatment. What are the causes? The cause of psoriasis is not known. Some things can make it worse. These are:  Skin damage, such as cuts, scrapes, sunburn, and dryness.  Not getting enough sunlight.  Some medicines.  Alcohol.  Tobacco.  Stress.  Infections. What increases the risk?  Having a family member with psoriasis.  Being very overweight (obese).  Being 20-40 years old.  Taking certain medicines. What are the signs or symptoms? There are different types of psoriasis. The types are:  Plaque. This is the most common. Symptoms include red, raised patches with a silvery coating. These may be itchy. Your nails may be crumbly or fall off.  Guttate. Symptoms include small red spots on your stomach area, arms, and legs. These may happen after you have been sick, such as with strep throat.  Inverse. Symptoms include patches in your armpits, under your breasts, private areas, or on your butt.  Pustular. Symptoms include pus-filled bumps on the palms of your hands or the soles of your feet. You also may feel very tired, weak, have a fever, and not be hungry.  Erythrodermic. Symptoms include bright red skin that looks burned. You may have a fast heartbeat and a body temperature that is too high or too low. You may be itchy or in pain.  Sebopsoriasis. Symptoms include red patches on your scalp, forehead, and face that are greasy.  Psoriatic arthritis. Symptoms include swollen,  painful joints along with scaly skin patches. How is this treated? There is no cure for this condition, but treatment can:  Help your skin heal.  Lessen itching and irritation and swelling (inflammation).  Slow the growth of new skin cells.  Help your body's defense system respond better to your skin. Treatment may include:  Creams or ointments.  Light therapy. This may include natural sunlight or light therapy in a doctor's office.  Medicines. These can help your body better manage skin cells. They may be used with light therapy or ointments. Medicines may include pills or injections. You may also get antibiotic medicines if you have an infection. Follow these instructions at home: Skin Care  Apply lotion to your skin as needed. Only use those that your doctor has said are okay.  Apply cool, wet cloths (cold compresses) to the affected areas.  Do not use a hot tub or take hot showers. Use slightly warm, not hot, water when taking showers and baths.  Do not scratch your skin. Lifestyle   Do not use any products that contain nicotine or tobacco, such as cigarettes, e-cigarettes, and chewing tobacco. If you need help quitting, ask your doctor.  Lower your stress.  Keep a healthy weight.  Go out in the sun as told by your doctor. Do not get sunburned.  Join a support group. Medicines  Take or use over-the-counter and prescription medicines only as told by your doctor.  If you were prescribed an antibiotic medicine, take it as told by your doctor.   Do not stop using the antibiotic even if you start to feel better. Alcohol use If you drink alcohol:  Limit how much you use: ? 0-1 drink a day for women. ? 0-2 drinks a day for men.  Be aware of how much alcohol is in your drink. In the [place] one drink equals one 12 oz bottle of beer (355 mL), one 5 oz glass of wine (148 mL), or one 1 oz glass of hard liquor (44 mL). General instructions  Keep a journal to track the  things that cause symptoms (triggers). Try to avoid these things.  See a counselor if you feel the support would help.  Keep all follow-up visits as told by your doctor. This is important. Contact a doctor if:  You have a fever.  Your pain gets worse.  You have more redness or warmth in the affected areas.  You have new or worse pain or stiffness in your joints.  Your nails start to break easily or pull away from the nail bed.  You feel very sad (depressed). Summary  Psoriasis is a long-term (chronic) skin condition.  There is no cure for this condition, but treatment can help manage it.  Keep a journal to track the things that cause symptoms.  Take or use over-the-counter and prescription medicines only as told by your doctor.  Keep all follow-up visits as told by your doctor. This is important. This information is not intended to replace advice given to you by your health care provider. Make sure you discuss any questions you have with your health care provider. Document Revised: 08/18/2018 Document Reviewed: 08/18/2018 Elsevier Patient Education  2020 Elsevier Inc.   Fluocinonide skin cream, gel, ointment, or topical solution What is this medicine? FLUOCINONIDE (floo oh SIN oh lone) is a corticosteroid. It is used on the skin to reduce swelling, redness, itching, and allergic reactions. This medicine may be used for other purposes; ask your health care provider or pharmacist if you have questions. COMMON BRAND NAME(S): Fluovix, Fluovix Plus, Lidex, Lidex -E, Vanos What should I tell my health care provider before I take this medicine? They need to know if you have any of these conditions:  any active infection  diabetes  large areas of burned or damaged skin  skin wasting or thinning  an unusual or allergic reaction to fluocinonide, corticosteroids, sulfites, other medicines, foods, dyes, or preservatives  pregnant or trying to get  pregnant  breast-feeding How should I use this medicine? This medicine is for external use only. Do not take by mouth. Follow the directions on the prescription label. You should wear a glove when applying the medicine. Apply a thin film of medicine to the affected area as directed, and rub in gently. Do not use on healthy skin or over large areas of skin. Do not get this medicine in your eyes. If you do, rinse out with plenty of cool tap water. It is important not to use more medicine than prescribed. Do not use for more than 14 days. Talk to your pediatrician regarding the use of this medicine in children. Special care may be needed. If applying this medicine to the diaper area of a child, do not cover with tight-fitting diapers or plastic pants. This may increase the amount of medicine that passes through the skin and increase the risk of serious side effects. Elderly patients are more likely to have damaged skin through aging, and this may increase side effects. This medicine should only be used  for brief periods and infrequently in older patients. Overdosage: If you think you have taken too much of this medicine contact a poison control center or emergency room at once. NOTE: This medicine is only for you. Do not share this medicine with others. What if I miss a dose? If you miss a dose, use it as soon as you can. If it is almost time for your next dose, use only that dose. Do not use double or extra doses. What may interact with this medicine? Interactions are not expected. Do not use any other skin products without telling your doctor or health care professional. This list may not describe all possible interactions. Give your health care provider a list of all the medicines, herbs, non-prescription drugs, or dietary supplements you use. Also tell them if you smoke, drink alcohol, or use illegal drugs. Some items may interact with your medicine. What should I watch for while using this  medicine? Tell your doctor or health care professional if your symptoms do not start to get better within one week. Tell your doctor or health care professional if you are exposed to anyone with measles or chickenpox, or if you develop sores or blisters that do not heal properly. What side effects may I notice from receiving this medicine? Side effects that you should report to your doctor or health care professional as soon as possible:  burning or itching of the skin  dark red spots on the skin  infection  painful, red, pus filled blisters in hair follicles  thinning of the skin, sunburn more likely especially on the face Side effects that usually do not require medical attention (report to your doctor or health care professional if they continue or are bothersome):  dry skin, irritation  unusual increased growth of hair on the face or body This list may not describe all possible side effects. Call your doctor for medical advice about side effects. You may report side effects to FDA at 1-800-FDA-1088. Where should I keep my medicine? Keep out of the reach of children. Store at room temperature between 15 and 30 degrees C (59 and 86 degrees F). Avoid excessive heat above 30 degrees C (86 degrees F). Do not freeze. Throw away any unused medicine after the expiration date. NOTE: This sheet is a summary. It may not cover all possible information. If you have questions about this medicine, talk to your doctor, pharmacist, or health care provider.  2020 Elsevier/Gold Standard (2008-02-17 16:59:47)

## 2020-10-12 ENCOUNTER — Encounter: Payer: Self-pay | Admitting: Family Medicine

## 2020-10-12 LAB — HEMOGLOBIN A1C
Est. average glucose Bld gHb Est-mCnc: 128 mg/dL
Hgb A1c MFr Bld: 6.1 % — ABNORMAL HIGH (ref 4.8–5.6)

## 2020-10-12 LAB — VITAMIN D 25 HYDROXY (VIT D DEFICIENCY, FRACTURES): Vit D, 25-Hydroxy: 17.1 ng/mL — ABNORMAL LOW (ref 30.0–100.0)

## 2020-10-12 LAB — URINALYSIS
Bilirubin, UA: NEGATIVE
Glucose, UA: NEGATIVE
Nitrite, UA: NEGATIVE
RBC, UA: NEGATIVE
Specific Gravity, UA: 1.027 (ref 1.005–1.030)
Urobilinogen, Ur: 2 mg/dL — ABNORMAL HIGH (ref 0.2–1.0)
pH, UA: 7.5 (ref 5.0–7.5)

## 2020-10-12 LAB — CBC WITH DIFFERENTIAL/PLATELET
Basophils Absolute: 0.1 10*3/uL (ref 0.0–0.2)
Basos: 1 %
EOS (ABSOLUTE): 0.2 10*3/uL (ref 0.0–0.4)
Eos: 2 %
Hematocrit: 47.1 % (ref 37.5–51.0)
Hemoglobin: 16.1 g/dL (ref 13.0–17.7)
Immature Grans (Abs): 0 10*3/uL (ref 0.0–0.1)
Immature Granulocytes: 0 %
Lymphocytes Absolute: 1.9 10*3/uL (ref 0.7–3.1)
Lymphs: 28 %
MCH: 31 pg (ref 26.6–33.0)
MCHC: 34.2 g/dL (ref 31.5–35.7)
MCV: 91 fL (ref 79–97)
Monocytes Absolute: 0.6 10*3/uL (ref 0.1–0.9)
Monocytes: 8 %
Neutrophils Absolute: 4.2 10*3/uL (ref 1.4–7.0)
Neutrophils: 61 %
Platelets: 305 10*3/uL (ref 150–450)
RBC: 5.19 x10E6/uL (ref 4.14–5.80)
RDW: 12.2 % (ref 11.6–15.4)
WBC: 6.9 10*3/uL (ref 3.4–10.8)

## 2020-10-12 LAB — COMPREHENSIVE METABOLIC PANEL
ALT: 29 IU/L (ref 0–44)
AST: 21 IU/L (ref 0–40)
Albumin/Globulin Ratio: 1.8 (ref 1.2–2.2)
Albumin: 5.1 g/dL — ABNORMAL HIGH (ref 4.0–5.0)
Alkaline Phosphatase: 88 IU/L (ref 44–121)
BUN/Creatinine Ratio: 15 (ref 9–20)
BUN: 20 mg/dL (ref 6–20)
Bilirubin Total: 0.6 mg/dL (ref 0.0–1.2)
CO2: 26 mmol/L (ref 20–29)
Calcium: 10.2 mg/dL (ref 8.7–10.2)
Chloride: 101 mmol/L (ref 96–106)
Creatinine, Ser: 1.31 mg/dL — ABNORMAL HIGH (ref 0.76–1.27)
GFR calc Af Amer: 82 mL/min/{1.73_m2} (ref >59)
GFR calc non Af Amer: 71 mL/min/{1.73_m2} (ref >59)
Globulin, Total: 2.9 g/dL (ref 1.5–4.5)
Glucose: 50 mg/dL — ABNORMAL LOW (ref 65–99)
Potassium: 4.8 mmol/L (ref 3.5–5.2)
Sodium: 140 mmol/L (ref 134–144)
Total Protein: 8 g/dL (ref 6.0–8.5)

## 2020-10-12 LAB — VITAMIN B12: Vitamin B-12: 575 pg/mL (ref 232–1245)

## 2020-10-12 LAB — TSH: TSH: 1.39 u[IU]/mL (ref 0.450–4.500)

## 2020-10-12 LAB — LIPID PANEL
Chol/HDL Ratio: 4.6 ratio (ref 0.0–5.0)
Cholesterol, Total: 195 mg/dL (ref 100–199)
HDL: 42 mg/dL (ref >39)
LDL Chol Calc (NIH): 139 mg/dL — ABNORMAL HIGH (ref 0–99)
Triglycerides: 74 mg/dL (ref 0–149)
VLDL Cholesterol Cal: 14 mg/dL (ref 5–40)

## 2020-10-12 MED FILL — FLUOCINONIDE 0.05% CREAM: 0.05 | 15 days supply | Qty: 30 | Fill #0

## 2020-10-13 ENCOUNTER — Other Ambulatory Visit: Payer: Self-pay | Admitting: Family Medicine

## 2020-10-13 ENCOUNTER — Encounter: Payer: Self-pay | Admitting: Family Medicine

## 2020-10-13 DIAGNOSIS — E559 Vitamin D deficiency, unspecified: Secondary | ICD-10-CM

## 2020-10-13 MED ORDER — VITAMIN D (ERGOCALCIFEROL) 1.25 MG (50000 UNIT) PO CAPS: 50000.0000 [IU] | ORAL_CAPSULE | ORAL | 3 refills | Status: DC

## 2020-10-16 MED FILL — VIT D2 1.25 MG (50,000 UNIT: 1.25 MG | 84 days supply | Qty: 12 | Fill #0

## 2020-10-16 MED FILL — $LANTUS 100 UNITS/ML VIAL: 100 | 28 days supply | Qty: 10 | Fill #3

## 2020-10-16 MED FILL — $novoLOG 100 UNITS/ML VIAL: 100 | 28 days supply | Qty: 10 | Fill #8

## 2020-10-19 ENCOUNTER — Telehealth: Payer: Self-pay | Admitting: Family Medicine

## 2020-10-19 NOTE — Telephone Encounter (Signed)
Pt called for lab results. Pt ph [phone]

## 2020-10-19 NOTE — Telephone Encounter (Signed)
Pt aware of results and voiced understanding, all questions answered/concerns addressed.  

## 2020-10-24 ENCOUNTER — Telehealth: Payer: Self-pay | Admitting: Family Medicine

## 2020-10-24 NOTE — Telephone Encounter (Signed)
Referral Followup °

## 2020-11-22 ENCOUNTER — Other Ambulatory Visit: Payer: Self-pay | Admitting: Specialist

## 2020-11-22 MED FILL — !NOVOLOG 100UNITS/ML VIAL: 100/ML | 28 days supply | Qty: 10 | Fill #9

## 2020-11-24 MED FILL — DESONIDE 0.05% OINTMENT: 0.05 | 14 days supply | Qty: 30 | Fill #0

## 2020-12-21 MED FILL — $novoLOG 100 UNITS/ML VIAL: 100 | 28 days supply | Qty: 10 | Fill #0

## 2020-12-21 MED FILL — $LANTUS 100 UNITS/ML VIAL: 100 | 28 days supply | Qty: 10 | Fill #4

## 2021-01-09 ENCOUNTER — Other Ambulatory Visit: Payer: Self-pay

## 2021-01-09 ENCOUNTER — Ambulatory Visit (INDEPENDENT_AMBULATORY_CARE_PROVIDER_SITE_OTHER): Payer: Self-pay | Admitting: Family Medicine

## 2021-01-09 ENCOUNTER — Encounter: Payer: Self-pay | Admitting: Family Medicine

## 2021-01-09 VITALS — BP 122/81 | HR 88 | Temp 98.6°F | Ht 71.0 in | Wt 204.0 lb

## 2021-01-09 DIAGNOSIS — R7309 Other abnormal glucose: Secondary | ICD-10-CM

## 2021-01-09 DIAGNOSIS — R21 Rash and other nonspecific skin eruption: Secondary | ICD-10-CM

## 2021-01-09 DIAGNOSIS — Z09 Encounter for follow-up examination after completed treatment for conditions other than malignant neoplasm: Secondary | ICD-10-CM

## 2021-01-09 DIAGNOSIS — E109 Type 1 diabetes mellitus without complications: Secondary | ICD-10-CM

## 2021-01-09 LAB — POCT GLYCOSYLATED HEMOGLOBIN (HGB A1C)
HbA1c POC (<> result, manual entry): 6.1 % (ref 4.0–5.6)
HbA1c, POC (controlled diabetic range): 6.1 % (ref 0.0–7.0)
HbA1c, POC (prediabetic range): 6.1 % (ref 5.7–6.4)
Hemoglobin A1C: 6.1 % — AB (ref 4.0–5.6)

## 2021-01-09 LAB — POCT URINALYSIS DIPSTICK
Bilirubin, UA: NEGATIVE
Blood, UA: NEGATIVE
Glucose, UA: NEGATIVE
Ketones, UA: NEGATIVE
Leukocytes, UA: NEGATIVE
Nitrite, UA: NEGATIVE
Protein, UA: NEGATIVE
Spec Grav, UA: >=1.03 — AB (ref 1.010–1.025)
Urobilinogen, UA: 0.2 E.U./dL
pH, UA: 7 (ref 5.0–8.0)

## 2021-01-09 NOTE — Progress Notes (Signed)
[facility]   Established Patient Office Visit  Subjective:  Patient ID: Zachary Vazquez, male    DOB: 11-Feb-1986  Age: 35 y.o. MRN: [id number]  CC:  Chief Complaint  Patient presents with  . Follow-up    3 month follow up     HPI Zachary Vazquez is a 35 year old male who presents for Follow Up today.   Patient Active Problem List   Diagnosis Date Noted  . Gastritis 05/14/2020  . Diabetic gastroparesis (HCC) 05/14/2020  . Uncontrolled type 1 diabetes mellitus with ketoacidosis without coma, with long-term current use of insulin (HCC) 05/10/2020  . SIRS (systemic inflammatory response syndrome) (HCC) 05/10/2020  . Nicotine dependence, cigarettes, uncomplicated 05/10/2020  . 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  . Hyperglycemia 04/27/2020  . History of delayed wound healing 04/16/2020  . Hemoglobin A1c less than 7.0% 04/16/2020  . Allergies 04/16/2020  . Rash and nonspecific skin eruption 11/23/2019  . Spider bite 11/18/2019  . Wound of left leg 11/18/2019  . Intractable nausea and vomiting 05/04/2019  . DKA, type 1 (HCC) 05/04/2019  . DKA (diabetic ketoacidosis) (HCC) 05/04/2019  . Nausea 12/02/2017  . Tobacco use disorder 03/30/2017  . Type 1 diabetes mellitus (HCC) 03/30/2017  . Leukocytosis 03/30/2017   Current Status: Since his last office visit, he is doing well with no complaints. He denies fatigue, frequent urination, blurred vision, excessive hunger, excessive thirst, weight gain, weight loss, and poor wound healing. He continues to have skin rash and has followed up with Dermatologist and is using Triamcinolone and Lidex as needed. He states creams are working well. He has not followed up with Endocrinologist as of yet for possible diabetic pump. He continues to check his feet regularly. He denies fevers, chills, recent infections, weight loss, and night sweats. He has not had any headaches, visual changes, dizziness, and  falls. No chest pain, heart palpitations, cough and shortness of breath reported. Denies GI problems such as nausea, vomiting, diarrhea, and constipation. He has no reports of blood in stools, dysuria and hematuria. No depression or anxiety reported today. He is taking all medications as prescribed. He denies pain today.   Past Medical History:  Diagnosis Date  . History of delayed wound healing 10/2019  . Rash and nonspecific skin eruption 10/2019  . Type 1 diabetes mellitus (HCC)   . Vitamin D deficiency 09/2020  . Wound of lower extremity, left, sequela 10/2019    Past Surgical History:  Procedure Laterality Date  . TONSILLECTOMY      Family History  Problem Relation Age of Onset  . Hypertension Mother   . Diabetes Mellitus I Brother     Social History   Socioeconomic History  . Marital status: Single    Spouse name: Not on file  . Number of children: Not on file  . Years of education: Not on file  . Highest education level: Not on file  Occupational History  . Not on file  Tobacco Use  . Smoking status: Current Every Day Smoker    Packs/day: 0.25    Years: 15.00    Pack years: 3.75    Types: Cigarettes  . Smokeless tobacco: Never Used  Vaping Use  . Vaping Use: Never used  Substance and Sexual Activity  . Alcohol use: Yes    Comment: occ  . Drug use: Not Currently    Types: Marijuana    Comment: occ  . Sexual activity: Yes  Birth control/protection: None  Other Topics Concern  . Not on file  Social History Narrative  . Not on file   Social Determinants of Health   Financial Resource Strain: Not on file  Food Insecurity: Not on file  Transportation Needs: Not on file  Physical Activity: Not on file  Stress: Not on file  Social Connections: Not on file  Intimate Partner Violence: Not on file    Outpatient Medications Prior to Visit  Medication Sig Dispense Refill  . insulin aspart (NOVOLOG) 100 UNIT/ML injection Use insulin sliding scale 20 mL 11   . insulin glargine (LANTUS) 100 UNIT/ML injection Inject 0.3 mLs (30 Units total) into the skin at bedtime. 10 mL 12  . Vitamin D, Ergocalciferol, (DRISDOL) 1.25 MG (50000 UNIT) CAPS capsule Take 1 capsule (50,000 Units total) by mouth every 7 (seven) days. 5 capsule 3  . fluocinonide cream (LIDEX) 0.05 % Apply 1 application topically 2 (two) times daily. 30 g 3   No facility-administered medications prior to visit.    Allergies  Allergen Reactions  . Clindamycin/Lincomycin Anaphylaxis  . Bactrim [Sulfamethoxazole-Trimethoprim] Swelling and Other (See Comments)    Eyes swell and bumps appear on/near the eyes    ROS Review of Systems  Constitutional: Negative.   HENT: Negative.   Eyes: Negative.   Respiratory: Negative.   Cardiovascular: Negative.   Gastrointestinal: Negative.   Endocrine: Negative.   Genitourinary: Negative.   Musculoskeletal: Negative.   Skin: Positive for rash.  Allergic/Immunologic: Negative.   Neurological: Positive for dizziness (occasional ) and headaches (occasional ).  Hematological: Negative.   Psychiatric/Behavioral: Negative.    Objective:    Physical Exam Vitals and nursing note reviewed.  Constitutional:      Appearance: Normal appearance.  HENT:     Head: Normocephalic and atraumatic.     Nose: Nose normal.     Mouth/Throat:     Mouth: Mucous membranes are moist.     Pharynx: Oropharynx is clear.  Cardiovascular:     Rate and Rhythm: Normal rate and regular rhythm.     Pulses: Normal pulses.     Heart sounds: Normal heart sounds.  Pulmonary:     Effort: Pulmonary effort is normal.     Breath sounds: Normal breath sounds.  Abdominal:     General: Bowel sounds are normal.     Palpations: Abdomen is soft.  Musculoskeletal:        General: Normal range of motion.     Cervical back: Normal range of motion and neck supple.  Skin:    General: Skin is warm and dry.  Neurological:     General: No focal deficit present.     Mental  Status: He is alert and oriented to person, place, and time.  Psychiatric:        Mood and Affect: Mood normal.        Behavior: Behavior normal.        Thought Content: Thought content normal.        Judgment: Judgment normal.     BP 122/81 (BP Location: Left Arm, Patient Position: Sitting, Cuff Size: Normal)   Pulse 88   Temp 98.6 F (37 C) (Temporal)   Ht 5\' 11"  (1.803 m)   Wt 204 lb (92.5 kg)   SpO2 98%   BMI 28.45 kg/m  Wt Readings from Last 3 Encounters:  01/09/21 204 lb (92.5 kg)  10/11/20 191 lb (86.6 kg)  05/12/20 192 lb 7.4 oz (87.3 kg)  Health Maintenance Due  Topic Date Due  . Hepatitis C Screening  Never done  . PNEUMOCOCCAL POLYSACCHARIDE VACCINE AGE 27-64 HIGH RISK  Never done  . COVID-19 Vaccine (1) Never done  . OPHTHALMOLOGY EXAM  Never done  . TETANUS/TDAP  Never done  . FOOT EXAM  12/22/2018  . INFLUENZA VACCINE  Never done  . URINE MICROALBUMIN  10/03/2020    There are no preventive care reminders to display for this patient.  Lab Results  Component Value Date   TSH 1.390 10/11/2020   Lab Results  Component Value Date   WBC 6.9 10/11/2020   HGB 16.1 10/11/2020   HCT 47.1 10/11/2020   MCV 91 10/11/2020   PLT 305 10/11/2020   Lab Results  Component Value Date   NA 140 10/11/2020   K 4.8 10/11/2020   CO2 26 10/11/2020   GLUCOSE 50 (L) 10/11/2020   BUN 20 10/11/2020   CREATININE 1.31 (H) 10/11/2020   BILITOT 0.6 10/11/2020   ALKPHOS 88 10/11/2020   AST 21 10/11/2020   ALT 29 10/11/2020   PROT 8.0 10/11/2020   ALBUMIN 5.1 (H) 10/11/2020   CALCIUM 10.2 10/11/2020   ANIONGAP 12 05/14/2020   Lab Results  Component Value Date   CHOL 195 10/11/2020   Lab Results  Component Value Date   HDL 42 10/11/2020   Lab Results  Component Value Date   LDLCALC 139 (H) 10/11/2020   Lab Results  Component Value Date   TRIG 74 10/11/2020   Lab Results  Component Value Date   CHOLHDL 4.6 10/11/2020   Lab Results  Component Value  Date   HGBA1C 6.1 (A) 01/09/2021   HGBA1C 6.1 01/09/2021   HGBA1C 6.1 01/09/2021   HGBA1C 6.1 01/09/2021   Assessment & Plan:    1. Type 1 diabetes mellitus without complication (HCC) He will continue medication as prescribed, to decrease foods/beverages high in sugars and carbs and follow Heart Healthy or DASH diet. Increase physical activity to at least 30 minutes cardio exercise daily.  - HgB A1c - POCT Urinalysis Dipstick - Microalbumin/Creatinine Ratio, Urine  2. Hemoglobin A1c less than 7.0% Hgb A1c is stable at 6.1 today.   3. Skin rash Stable. Continue using creams as needed.   4. Follow up He will follow up in 6 months.   No orders of the defined types were placed in this encounter.   Orders Placed This Encounter  Procedures  . Microalbumin/Creatinine Ratio, Urine  . HgB A1c  . POCT Urinalysis Dipstick    Referral Orders  No referral(s) requested today    Raliegh Ip, MSN, ANE, FNP-BC [facility] [address] [place], [place] [address] [phone]- fax   Problem List Items Addressed This Visit      Endocrine   Type 1 diabetes mellitus (HCC) - Primary   Relevant Orders   HgB A1c (Completed)   POCT Urinalysis Dipstick   Microalbumin/Creatinine Ratio, Urine     Other   Hemoglobin A1c less than 7.0%    Other Visit Diagnoses    Skin rash       Follow up          No orders of the defined types were placed in this encounter.   Follow-up: No follow-ups on file.    [name], FNP

## 2021-01-10 ENCOUNTER — Encounter: Payer: Self-pay | Admitting: Family Medicine

## 2021-01-10 LAB — MICROALBUMIN / CREATININE URINE RATIO
Creatinine, Urine: 122.8 mg/dL
Microalb/Creat Ratio: 7 mg/g creat (ref 0–29)
Microalbumin, Urine: 8.4 ug/mL

## 2021-02-01 ENCOUNTER — Other Ambulatory Visit: Payer: Self-pay | Admitting: Family Medicine

## 2021-02-01 ENCOUNTER — Other Ambulatory Visit: Payer: Self-pay

## 2021-02-01 DIAGNOSIS — E1010 Type 1 diabetes mellitus with ketoacidosis without coma: Secondary | ICD-10-CM

## 2021-02-01 DIAGNOSIS — R7309 Other abnormal glucose: Secondary | ICD-10-CM

## 2021-02-01 DIAGNOSIS — E109 Type 1 diabetes mellitus without complications: Secondary | ICD-10-CM

## 2021-02-02 ENCOUNTER — Other Ambulatory Visit: Payer: Self-pay

## 2021-02-08 ENCOUNTER — Other Ambulatory Visit: Payer: Self-pay

## 2021-02-21 ENCOUNTER — Other Ambulatory Visit: Payer: Self-pay

## 2021-02-22 ENCOUNTER — Other Ambulatory Visit: Payer: Self-pay

## 2021-02-23 ENCOUNTER — Other Ambulatory Visit: Payer: Self-pay

## 2021-03-01 ENCOUNTER — Ambulatory Visit: Payer: Self-pay | Admitting: Endocrinology

## 2021-03-22 ENCOUNTER — Ambulatory Visit: Payer: Self-pay | Admitting: Endocrinology

## 2021-03-27 ENCOUNTER — Other Ambulatory Visit: Payer: Self-pay

## 2021-04-16 ENCOUNTER — Other Ambulatory Visit (HOSPITAL_BASED_OUTPATIENT_CLINIC_OR_DEPARTMENT_OTHER): Payer: Self-pay

## 2021-04-16 MED ORDER — PANTOPRAZOLE SODIUM 40 MG PO TBEC
DELAYED_RELEASE_TABLET | ORAL | 0 refills | Status: DC
  Filled 2021-04-16 – 2021-04-18 (×2): qty 14, 14d supply, fill #0

## 2021-04-18 ENCOUNTER — Other Ambulatory Visit (HOSPITAL_BASED_OUTPATIENT_CLINIC_OR_DEPARTMENT_OTHER): Payer: Self-pay

## 2021-04-18 ENCOUNTER — Other Ambulatory Visit: Payer: Self-pay

## 2021-04-23 ENCOUNTER — Other Ambulatory Visit: Payer: Self-pay

## 2021-04-23 MED ORDER — INSULIN ASPART 100 UNIT/ML IJ SOLN
INTRAMUSCULAR | 0 refills | Status: DC
  Filled 2021-04-23: qty 10, 28d supply, fill #0

## 2021-04-24 ENCOUNTER — Other Ambulatory Visit: Payer: Self-pay

## 2021-05-08 ENCOUNTER — Other Ambulatory Visit: Payer: Self-pay

## 2021-05-24 ENCOUNTER — Ambulatory Visit: Payer: Self-pay | Admitting: Endocrinology

## 2021-06-11 ENCOUNTER — Other Ambulatory Visit: Payer: Self-pay | Admitting: Internal Medicine

## 2021-06-11 MED ORDER — INSULIN ASPART 100 UNIT/ML IJ SOLN
INTRAMUSCULAR | 0 refills | Status: DC
  Filled 2021-06-11: qty 10, 28d supply, fill #0
  Filled 2021-08-30: qty 10, 28d supply, fill #1
  Filled 2021-10-08: qty 10, 28d supply, fill #2

## 2021-06-11 NOTE — Telephone Encounter (Signed)
Medication Refill - Medication:  insulin aspart (NOVOLOG) 100 UNIT/ML injection   Has the patient contacted their pharmacy? No. (Agent: If no, request that the patient contact the pharmacy for the refill.) (Agent: If yes, when and what did the pharmacy advise?)  Preferred Pharmacy (with phone number or street name):  [facility]  Phone:  [phone] Fax:  [phone]  Agent: Please be advised that RX refills may take up to 3 business days. We ask that you follow-up with your pharmacy.

## 2021-06-12 ENCOUNTER — Other Ambulatory Visit: Payer: Self-pay

## 2021-06-13 ENCOUNTER — Other Ambulatory Visit: Payer: Self-pay

## 2021-06-14 ENCOUNTER — Other Ambulatory Visit: Payer: Self-pay

## 2021-06-15 ENCOUNTER — Other Ambulatory Visit: Payer: Self-pay

## 2021-07-13 ENCOUNTER — Other Ambulatory Visit: Payer: Self-pay

## 2021-07-13 ENCOUNTER — Ambulatory Visit: Payer: Self-pay | Admitting: Family Medicine

## 2021-07-13 ENCOUNTER — Encounter: Payer: Self-pay | Admitting: Nurse Practitioner

## 2021-07-13 ENCOUNTER — Telehealth: Payer: Self-pay | Admitting: Clinical

## 2021-07-13 ENCOUNTER — Ambulatory Visit (INDEPENDENT_AMBULATORY_CARE_PROVIDER_SITE_OTHER): Payer: Self-pay | Admitting: Nurse Practitioner

## 2021-07-13 VITALS — BP 134/89 | HR 85 | Temp 98.2°F | Ht 71.0 in | Wt 182.6 lb

## 2021-07-13 DIAGNOSIS — Z130 Encounter for screening for diseases of the blood and blood-forming organs and certain disorders involving the immune mechanism: Secondary | ICD-10-CM

## 2021-07-13 DIAGNOSIS — E109 Type 1 diabetes mellitus without complications: Secondary | ICD-10-CM

## 2021-07-13 LAB — POCT URINALYSIS DIP (CLINITEK)
Bilirubin, UA: NEGATIVE
Blood, UA: NEGATIVE
Glucose, UA: 500 mg/dL — AB
Leukocytes, UA: NEGATIVE
Nitrite, UA: NEGATIVE
POC PROTEIN,UA: NEGATIVE
Spec Grav, UA: 1.025 (ref 1.010–1.025)
Urobilinogen, UA: 1 E.U./dL
pH, UA: 5.5 (ref 5.0–8.0)

## 2021-07-13 LAB — POCT GLYCOSYLATED HEMOGLOBIN (HGB A1C)
HbA1c POC (<> result, manual entry): 5.5 % (ref 4.0–5.6)
HbA1c, POC (controlled diabetic range): 5.5 % (ref 0.0–7.0)
HbA1c, POC (prediabetic range): 5.5 % — AB (ref 5.7–6.4)
Hemoglobin A1C: 5.5 % (ref 4.0–5.6)

## 2021-07-13 LAB — GLUCOSE, POCT (MANUAL RESULT ENTRY): POC Glucose: 253 mg/dl — AB (ref 70–99)

## 2021-07-13 MED ORDER — INSULIN GLARGINE 100 UNIT/ML ~~LOC~~ SOLN
35.0000 [IU] | Freq: Every day | SUBCUTANEOUS | 12 refills | Status: DC
  Filled 2021-07-13: qty 10, 28d supply, fill #0
  Filled 2021-08-30: qty 10, 28d supply, fill #1
  Filled 2021-10-08: qty 30, 84d supply, fill #2

## 2021-07-13 NOTE — Telephone Encounter (Signed)
[facility] Case Management Referral Note  07/13/2021 Name: Zachary Vazquez MRN: [id number] DOB: 12-20-1985 Zachary Vazquez is a 35 y.o. year old male who sees No primary care provider on file. for primary care. LCSW was consulted to assess patient's needs and assist the patient with [facility] .  Interpreter: No.   Interpreter Name & Language: none  Assessment: Patient experiencing financial constraints related to lack of health coverage.   Intervention: Patient has run into challenges getting his insulin filled at [facility] does not carry the type of insulin he uses. Per patient report of income, he appears to meet income eligibility for [employer]. CSW called patient and discussed. Reviewed [employer] Card application with patient. CSW to follow.  Review of patient status, including review of consultants reports, relevant laboratory and other test results, and collaboration with appropriate care team members and the patient's provider was performed as part of comprehensive patient evaluation and provision of services.    Abigail Butts, LCSW [facility] [phone]

## 2021-07-13 NOTE — Progress Notes (Signed)
[facility] Patient [facility] [address] [place], [place]  [address] Phone:  [phone]   Fax:  [phone]   Established Patient Office Visit  Subjective:  Patient ID: Zachary Vazquez, male    DOB: August 06, 1986  Age: 35 y.o. MRN: [id number]  CC:  Chief Complaint  Patient presents with   Follow-up    Pt is here today for his follow up visit. Pt states that he had passed out yesterday at work due to low blood sugar level at 31 per EMT when they checked it.     HPI Zachary Vazquez presents for follow up. He  has a past medical history of History of delayed wound healing (10/2019), Rash and nonspecific skin eruption (10/2019), Type 1 diabetes mellitus (HCC), Vitamin D deficiency (09/2020), and Wound of lower extremity, left, sequela (10/2019).   He reports that on yesterday he had an unresponsive episode related to his blood glucose.  His blood sugar was 31 when he was found and EMS was called.  He was evaluated by EMS and given glucagon intramuscular due to the inability to start an IV.  He reports this is after his blood glucose that reached 410.  He had taken 10 units of insulin that morning.  At noon his blood glucose was 253 he took 5 additional units and did not eat at that time.  It was later when EMS was called. He woke up in an ambulance.  He was not taken to the emergency room.  He reports that he has been doing so well treating his blood glucose on his on.  He monitors his CBG 4 times a day and uses the Lantus 25 units at bedtime and NovoLog sliding scale.  He reports that his A1c has been elevated in the past greater than 10%.  However his last A1c was over the past 2 years indicate control of 7.6%  He reports that he has some concerns about access to his Lantus he sometimes runs out and is not able to get refills.   Denies headache, dizziness, visual changes, shortness of breath, dyspnea on exertion, chest pain, nausea, vomiting or any edema.       Past Medical History:   Diagnosis Date   History of delayed wound healing 10/2019   Rash and nonspecific skin eruption 10/2019   Type 1 diabetes mellitus (HCC)    Vitamin D deficiency 09/2020   Wound of lower extremity, left, sequela 10/2019    Past Surgical History:  Procedure Laterality Date   TONSILLECTOMY      Family History  Problem Relation Age of Onset   Hypertension Mother    Diabetes Mellitus I Brother     Social History   Socioeconomic History   Marital status: Single    Spouse name: Not on file   Number of children: Not on file   Years of education: Not on file   Highest education level: Not on file  Occupational History   Not on file  Tobacco Use   Smoking status: Every Day    Packs/day: 0.25    Years: 15.00    Pack years: 3.75    Types: Cigarettes   Smokeless tobacco: Never  Vaping Use   Vaping Use: Never used  Substance and Sexual Activity   Alcohol use: Not Currently    Comment: occ   Drug use: Not Currently    Types: Marijuana    Comment: occ   Sexual activity: Yes    Birth control/protection:  None  Other Topics Concern   Not on file  Social History Narrative   Not on file   Social Determinants of Health   Financial Resource Strain: Not on file  Food Insecurity: Not on file  Transportation Needs: Not on file  Physical Activity: Not on file  Stress: Not on file  Social Connections: Not on file  Intimate Partner Violence: Not on file    Outpatient Medications Prior to Visit  Medication Sig Dispense Refill   desonide (DESOWEN) 0.05 % lotion APPLY 1 SMALL AMOUNT TO AFFECTED AREA TWICE A DAY. 59 mL 2   desonide (DESOWEN) 0.05 % ointment APPLY 1 A SMALL AMOUNT TO AFFECTED AREA ONCE A DAY 60 g 1   fluocinonide cream (LIDEX) 0.05 % APPLY 1 APPLICATION TOPICALLY 2 (TWO) TIMES DAILY. 30 g 3   ibuprofen (ADVIL) 400 MG tablet TAKE 1 TO 2 TABLETS BY MOUTH 3 TO 4 TIMES DAILY FOR PAIN 16 tablet 0   insulin aspart (NOVOLOG) 100 UNIT/ML injection Use as directed per  sliding scale 230 mL 0   sodium fluoride (FLUORISHIELD) 1.1 % GEL dental gel APPLY A PEA SIZED AMOUNT SIZE AMOUNT OF THE MATERIAL ONTO THE BRUSH AND APPLY TO ALL SURFACES OF THE TEETH FOR TWO MINUTES ONE TIME PER DAY AND EXPERIENCE 100 mL 4   Vitamin D, Ergocalciferol, (DRISDOL) 1.25 MG (50000 UNIT) CAPS capsule TAKE 1 CAPSULE (50,000 UNITS TOTAL) BY MOUTH EVERY 7 (SEVEN) DAYS. 5 capsule 3   insulin glargine (LANTUS) 100 UNIT/ML injection Inject 0.3 mLs (30 Units total) into the skin at bedtime. 10 mL 12   pantoprazole (PROTONIX) 40 MG tablet TAKE 1 TABLET BY MOUTH DAILY. 14 tablet 0   ibuprofen (ADVIL) 400 MG tablet 1 TO 2 TABLETS 3-4 TIMES A DAY AS NEEDED FOR PAIN 16 tablet 0   No facility-administered medications prior to visit.    Allergies  Allergen Reactions   Clindamycin/Lincomycin Anaphylaxis   Bactrim [Sulfamethoxazole-Trimethoprim] Swelling and Other (See Comments)    Eyes swell and bumps appear on/near the eyes     ROS Review of Systems    Objective:    Physical Exam Constitutional:      Appearance: He is normal weight.  HENT:     Head: Normocephalic and atraumatic.  Cardiovascular:     Rate and Rhythm: Normal rate and regular rhythm.     Pulses: Normal pulses.     Heart sounds: Normal heart sounds.  Pulmonary:     Effort: Pulmonary effort is normal.     Breath sounds: Normal breath sounds.  Musculoskeletal:        General: Normal range of motion.     Cervical back: Normal range of motion.  Skin:    General: Skin is warm and dry.     Capillary Refill: Capillary refill takes less than 2 seconds.  Neurological:     General: No focal deficit present.     Mental Status: He is alert and oriented to person, place, and time.  Psychiatric:        Mood and Affect: Mood normal.        Behavior: Behavior normal.        Thought Content: Thought content normal.        Judgment: Judgment normal.   BP 134/89   Pulse 85   Temp 98.2 F (36.8 C)   Ht 5\' 11"  (1.803 m)    Wt 182 lb 9.6 oz (82.8 kg)   SpO2 99%   BMI  25.47 kg/m  Wt Readings from Last 3 Encounters:  07/13/21 182 lb 9.6 oz (82.8 kg)  01/09/21 204 lb (92.5 kg)  10/11/20 191 lb (86.6 kg)     Health Maintenance Due  Topic Date Due   COVID-19 Vaccine (1) Never done   OPHTHALMOLOGY EXAM  Never done   Hepatitis C Screening  Never done   TETANUS/TDAP  Never done   FOOT EXAM  12/22/2018   INFLUENZA VACCINE  Never done    There are no preventive care reminders to display for this patient.  Lab Results  Component Value Date   TSH 1.390 10/11/2020   Lab Results  Component Value Date   WBC 6.9 10/11/2020   HGB 16.1 10/11/2020   HCT 47.1 10/11/2020   MCV 91 10/11/2020   PLT 305 10/11/2020   Lab Results  Component Value Date   NA 140 10/11/2020   K 4.8 10/11/2020   CO2 26 10/11/2020   GLUCOSE 50 (L) 10/11/2020   BUN 20 10/11/2020   CREATININE 1.31 (H) 10/11/2020   BILITOT 0.6 10/11/2020   ALKPHOS 88 10/11/2020   AST 21 10/11/2020   ALT 29 10/11/2020   PROT 8.0 10/11/2020   ALBUMIN 5.1 (H) 10/11/2020   CALCIUM 10.2 10/11/2020   ANIONGAP 12 05/14/2020   Lab Results  Component Value Date   CHOL 195 10/11/2020   Lab Results  Component Value Date   HDL 42 10/11/2020   Lab Results  Component Value Date   LDLCALC 139 (H) 10/11/2020   Lab Results  Component Value Date   TRIG 74 10/11/2020   Lab Results  Component Value Date   CHOLHDL 4.6 10/11/2020   Lab Results  Component Value Date   HGBA1C 5.5 07/13/2021   HGBA1C 5.5 07/13/2021   HGBA1C 5.5 (A) 07/13/2021   HGBA1C 5.5 07/13/2021      Assessment & Plan:   Problem List Items Addressed This Visit       Endocrine   Type 1 diabetes mellitus (HCC) - Primary Patient congratulated on the amount of work that he is planning and to keep in his diabetes control. Did reinforce proper diet and insulin use due to the risk of hypoglycemia. Patient goal is to have have a insulin pump.  He does not have insurance so  this places some limitations on treatment options.  We will look into additional resources    Relevant Medications   insulin glargine (LANTUS) 100 UNIT/ML injection   Other Relevant Orders   POCT URINALYSIS DIP (CLINITEK) (Completed)   HgB A1c (Completed)   Glucose (CBG) (Completed)   Comp. Metabolic Panel (12)   Other Visit Diagnoses     Screening for deficiency anemia       Relevant Orders   CBC with Differential/Platelet       Meds ordered this encounter  Medications   insulin glargine (LANTUS) 100 UNIT/ML injection    Sig: Inject 0.35 mLs (35 Units total) into the skin at bedtime.    Dispense:  10 mL    Refill:  12    Order Specific Question:   Supervising Provider    Answer:   Quentin Angst [[id number]]    Follow-up: Return in about 3 months (around 10/12/2021) for follow up [id number].    Barbette Merino, NP

## 2021-07-13 NOTE — Patient Instructions (Signed)
Insulin Treatment for Diabetes Mellitus Diabetes, also known as diabetes mellitus, is a long-term (chronic) disease. It occurs when the body does not properly use sugar (glucose). Glucose levels are controlled by a hormone called insulin. Insulin is made in the pancreas, which is an organ behind the stomach. In type 1 diabetes, the pancreas does not make insulin. In type 2 diabetes, the body does not use or respond to insulin properly (called insulin resistance). Also, in some people, the pancreas does not make enough insulin. Treatment plans for diabetes vary. They are unique for each person. Treatment plans depend on: The type of diabetes. The treatment goals. Your medical history. People with type 1 diabetes and some with type 2 diabetes will need to take insulin as part of their treatment plan. Ask questions to understand your insulin treatment so you can be active in managing your diabetes. Types of insulin Insulin molecules are delicate. Insulin is destroyed by enzymes in the stomach or intestine. For this reason, insulin is not given in pill form. It is either injected under the skin or inhaled into the lungs. You may use more than one type of insulin. The different types of insulin are described below. It is important to know the onset, peak, and duration of the type of insulin you take. The onset is when it starts lowering blood glucose. The peak is when it works the strongest. The duration is how long it works. Insulin comes in different strengths. The most common strength is U-100, or 100 units per 1 mL of insulin. It is important to make sure you are using the right strength of insulin with the right syringe. Rapid-acting insulin: Onset: Within 15 minutes. Peak: About 1-2 hours. Duration: 2-4 hours. Works well when taken right before a meal to quickly lower your blood glucose. This type of insulin is available as an injection and an inhaler. You and your health care provider will decide  if an injection or inhaler is best for you. Short-acting insulin: Onset: Within 30 minutes. Peak: 2-3 hours. Duration: 3-6 hours. Should be taken about 30 minutes before you start eating a meal. Intermediate-acting insulin: Onset: Within 1.5-4 hours. Peak: 4-12 hours. Duration: 12-18 hours. Lowers your blood glucose for a longer period of time. However, it does not work as well for lowering blood glucose right after a meal. Usually used 1-2 times per day. Long-acting insulin: Mimics the small amount of insulin that your pancreas usually makes throughout the day. Onset: Within 2 hours. Peak: There is no peak. Long-acting insulins lower blood glucose levels evenly throughout the day. Duration: At least 24 hours. Long-acting insulins should be used one or two times a day. Concentrated insulin: Concentrated insulins are available in higher concentrations than U-100 insulins. Concentrated insulins are helpful for people who require high doses of insulin, usually more than 100 units per day. Concentrated insulins deliver the same amount of insulin but in a smaller volume. Concentrated insulins are available as: Humulin (Regular insulin U-500) has 500 units per 1 mL of insulin. This insulin should only be used only with the U-500 syringe or U-500 insulin pen. Do not use another type of syringe with this insulin. The wrong type of syringe can cause serious problems such as low blood glucose. Humalog (Insulin Lispro, U-200) and Tresiba (Insulin degludec, U-200) have 200 units per 1 mL of insulin. These insulins are only available as a U-200 pen. Toujeo (Insulin glargine, U-300) has 300 units per 1 mL of insulin. This insulin is  only available as a U-300 pen. Common terms related to insulin treatment: Some terms that you might hear include: Basal insulin or background insulin This is the constant amount of insulin that keeps your blood glucose levels stable when you are not eating. People who have  type 1 diabetes need basal insulin in a nonstop (continuous) or steady dose 24 hours a day. People with type 2 diabetes may also get basal insulin. Usually, intermediate-acting or long-acting insulin is used one or two times a day to manage glucose levels. Bolus insulin This refers to meal-related insulin (prandial insulin). Blood glucose rises quickly after a meal (postprandial). Rapid-acting or short-acting insulin can be used before a meal (preprandial) to help control blood glucose after the meal. You may be told to adjust the amount of bolus insulin that you take based on how much carbohydrate is in your meal. Correction insulin This may also be called a correction dose or supplemental dose. This is a small amount of rapid-acting or short-acting insulin that can be used to lower your blood glucose if it is too high. You may be told to check your blood glucose at certain times of the day and use correction insulin as needed. Tight control, intensive therapy, or basal-bolus insulin therapy These terms are used for insulin plans that keep your blood glucose as close to your target as possible. They prevent your blood glucose from getting too high at any time of day, but especially after meals. People who have tight control of their diabetes have fewer long-term problems caused by diabetes. Insulins are also available as mixtures of basal and bolus insulins. Using a premixed insulin decreases the number of injections you might need everyday. Talk to your health care provider to see if an insulin mixture is right for you. What are the risks? Possible side effects of insulin treatment include: Low blood glucose (hypoglycemia). Weight gain. Bruising or irritation at the injection site. Some of these side effects can be caused by incorrect insulin doses and improper injection technique. Be sure to learn how to inject insulin properly. Supplies needed: Soap and water. Insulin. Insulin syringe or  insulin pen needles. Alcohol wipes. Blood glucose meter. Blood glucose test strips and lancets. A disposal container for sharp items (sharps container), such as an empty plastic bottle with a cover. How to use insulin Most often, insulin is given through an injection. It is injected using a syringe and needle, an insulin pen, a pump, or a jet injector. Your health care provider will: Prescribe the type and amount of insulin that you need. Tell you when you should inject your insulin. Usually, you will give yourself insulin injections. Other people can also be taught how to give you injections. You will use a type of syringe that is made only for insulin. Some people may have an insulin pump that delivers insulin steadily through a tube (cannula) that is placed under the skin. Insulin is also available in an inhaled form. An inhaler delivers bolus insulin doses. Inhaled insulin does not require injections, but most people will still need to use basal insulin that is injected. Injection sites Insulin is injected into a layer of fatty tissue under your skin. Good places to inject insulin include: Abdomen. Generally, the abdomen is the best place to inject insulin. However, you should avoid any area that is less than 2 inches (5 cm) from your belly button. Front of thigh. Upper, outer side of thigh. Upper, outer side of arm. Upper, outer part  of buttock. It is important to: Give your injection in a slightly different place each time. This helps to prevent irritation and improve absorption. Avoid injecting into areas that have scar tissue. Follow these instructions at home: Eating and drinking Talk with your health care provider or pharmacist about the type of insulin you should take and when you should take it. You should know when your insulin peaksand how long it lasts. You need this information to plan mealtimes and exercise. Follow instructions from your health care provider about a healthy  meal plan. Do not skip meals. Drink enough fluid to keep your urine pale yellow. Follow your sick day plan whenever you cannot eat or drink normally. Make this plan in advance with your health care provider. Lifestyle Work with your health care provider to manage your weight, blood pressure, cholesterol, and stress. Exercise regularly. Avoid drinking alcohol. Do not use any products that contain nicotine or tobacco. These products include cigarettes, chewing tobacco, and vaping devices, such as e-cigarettes. If you need help quitting, ask your health care provider. General instructions  Check your blood glucose as told. Your health care provider will tell you how often and when you should check your blood glucose. Your health care provider will also tell you what your glucose levels should be. Make sure to check your blood glucose before and after you exercise. If you exercise longer or in a different way than usual, check your blood glucose more often. Make sure you know the symptoms of high and low blood sugar and how to treat these. Take over-the-counter and prescription medicines only as told by your health care provider. Carry a medical alert card or wear medical alert jewelry. Keep all follow-up visits. This is important. Summary Diabetes is a long-term disease. It occurs when the body does not properly use glucose. Glucose levels are controlled by a hormone called insulin. Insulin treatment varies depending on your type of diabetes, your treatment goals, and your medical history. Talk with your health care provider or pharmacist about the type of insulin you should take and when you should take it. Check your blood glucose as told by your health care provider. Your health care provider will tell you how often and when you should check your blood glucose, and what your glucose levels should be. Know the symptoms of high and low blood glucose and how to treat them. This information is not  intended to replace advice given to you by your health care provider. Make sure you discuss any questions you have with your health care provider. Document Revised: 08/16/2020 Document Reviewed: 08/16/2020 Elsevier Patient Education  2022 [employer].

## 2021-07-14 LAB — COMP. METABOLIC PANEL (12)
AST: 18 IU/L (ref 0–40)
Albumin/Globulin Ratio: 1.8 (ref 1.2–2.2)
Albumin: 4.7 g/dL (ref 4.0–5.0)
Alkaline Phosphatase: 70 IU/L (ref 44–121)
BUN/Creatinine Ratio: 19 (ref 9–20)
BUN: 22 mg/dL — ABNORMAL HIGH (ref 6–20)
Bilirubin Total: 0.4 mg/dL (ref 0.0–1.2)
Calcium: 9.8 mg/dL (ref 8.7–10.2)
Chloride: 97 mmol/L (ref 96–106)
Creatinine, Ser: 1.14 mg/dL (ref 0.76–1.27)
Globulin, Total: 2.6 g/dL (ref 1.5–4.5)
Glucose: 256 mg/dL — ABNORMAL HIGH (ref 65–99)
Potassium: 5.4 mmol/L — ABNORMAL HIGH (ref 3.5–5.2)
Sodium: 137 mmol/L (ref 134–144)
Total Protein: 7.3 g/dL (ref 6.0–8.5)
eGFR: 87 mL/min/{1.73_m2} (ref >59)

## 2021-07-14 LAB — CBC WITH DIFFERENTIAL/PLATELET
Basophils Absolute: 0.1 10*3/uL (ref 0.0–0.2)
Basos: 1 %
EOS (ABSOLUTE): 0.5 10*3/uL — ABNORMAL HIGH (ref 0.0–0.4)
Eos: 6 %
Hematocrit: 45.9 % (ref 37.5–51.0)
Hemoglobin: 15 g/dL (ref 13.0–17.7)
Immature Grans (Abs): 0 10*3/uL (ref 0.0–0.1)
Immature Granulocytes: 0 %
Lymphocytes Absolute: 2 10*3/uL (ref 0.7–3.1)
Lymphs: 26 %
MCH: 31 pg (ref 26.6–33.0)
MCHC: 32.7 g/dL (ref 31.5–35.7)
MCV: 95 fL (ref 79–97)
Monocytes Absolute: 0.5 10*3/uL (ref 0.1–0.9)
Monocytes: 6 %
Neutrophils Absolute: 4.6 10*3/uL (ref 1.4–7.0)
Neutrophils: 61 %
Platelets: 253 10*3/uL (ref 150–450)
RBC: 4.84 x10E6/uL (ref 4.14–5.80)
RDW: 11.8 % (ref 11.6–15.4)
WBC: 7.6 10*3/uL (ref 3.4–10.8)

## 2021-07-16 ENCOUNTER — Telehealth: Payer: Self-pay | Admitting: Clinical

## 2021-07-16 NOTE — Telephone Encounter (Signed)
[facility] General Follow Up Note  07/16/2021 Name: Zachary Vazquez MRN: [id number] DOB: Dec 20, 1985 Zachary Vazquez is a 35 y.o. year old male who sees No primary care provider on file. for primary care. LCSW was consulted to assess patient's needs and assist the patient with [employer] .  Interpreter: No.   Interpreter Name & Language: none  Assessment: Patient experiencing financial constraints related to lack of health coverage.   Intervention: CSW called patient and finished reviewing [employer]. Providing patient with [employer] and [employer] applications via mail. Reviewed application and advised patient on supporting documents to be submitted with the application. Advised patient to follow up with CSW for assistance in scheduling appointment with financial counselor at [facility]) to submit the application if needed. Provided CSW contact information.   Review of patient status, including review of consultants reports, relevant laboratory and other test results, and collaboration with appropriate care team members and the patient's provider was performed as part of comprehensive patient evaluation and provision of services.    Abigail Butts, LCSW [facility] [phone]

## 2021-07-17 ENCOUNTER — Other Ambulatory Visit: Payer: Self-pay

## 2021-08-16 ENCOUNTER — Telehealth: Payer: Self-pay | Admitting: Clinical

## 2021-08-16 NOTE — Telephone Encounter (Signed)
[facility]ated [facility]al [facility] General Follow Up Note  08/16/2021 Name: Zachary Vazquez MRN: [id number] DOB: 1986/05/20 Zachary Vazquez is a 35 y.o. year old male who sees No primary care provider on file. for primary care. LCSW was consulted to assess patient's needs and assist the patient with [employer] .  Interpreter: No.   Interpreter Name & Language: none  Assessment: Patient experiencing financial constraints related to lack of health coverage.   Intervention: CSW called patient to follow up on [employer] and [employer]. Patient reported he is collecting the required documents. Advised patient to call CSW when completed to schedule with financial counselor to submit.  Review of patient status, including review of consultants reports, relevant laboratory and other test results, and collaboration with appropriate care team members and the patient's provider was performed as part of comprehensive patient evaluation and provision of services.    Abigail Butts, LCSW Patient [facility] [phone]

## 2021-08-26 ENCOUNTER — Observation Stay (HOSPITAL_COMMUNITY)
Admission: EM | Admit: 2021-08-26 | Discharge: 2021-08-27 | Disposition: A | Discharge status: Home / Self Care | Payer: Self-pay | Attending: Internal Medicine | Admitting: Internal Medicine

## 2021-08-26 ENCOUNTER — Emergency Department (HOSPITAL_COMMUNITY): Payer: Self-pay

## 2021-08-26 ENCOUNTER — Encounter (HOSPITAL_COMMUNITY): Payer: Self-pay

## 2021-08-26 DIAGNOSIS — E10649 Type 1 diabetes mellitus with hypoglycemia without coma: Principal | ICD-10-CM | POA: Insufficient documentation

## 2021-08-26 DIAGNOSIS — Z79899 Other long term (current) drug therapy: Secondary | ICD-10-CM | POA: Insufficient documentation

## 2021-08-26 DIAGNOSIS — D72829 Elevated white blood cell count, unspecified: Secondary | ICD-10-CM | POA: Diagnosis present

## 2021-08-26 DIAGNOSIS — R4182 Altered mental status, unspecified: Secondary | ICD-10-CM | POA: Insufficient documentation

## 2021-08-26 DIAGNOSIS — G9341 Metabolic encephalopathy: Secondary | ICD-10-CM

## 2021-08-26 DIAGNOSIS — Z20822 Contact with and (suspected) exposure to covid-19: Secondary | ICD-10-CM | POA: Insufficient documentation

## 2021-08-26 DIAGNOSIS — E109 Type 1 diabetes mellitus without complications: Secondary | ICD-10-CM | POA: Diagnosis present

## 2021-08-26 DIAGNOSIS — Z794 Long term (current) use of insulin: Secondary | ICD-10-CM | POA: Insufficient documentation

## 2021-08-26 DIAGNOSIS — R112 Nausea with vomiting, unspecified: Secondary | ICD-10-CM | POA: Diagnosis present

## 2021-08-26 DIAGNOSIS — F17210 Nicotine dependence, cigarettes, uncomplicated: Secondary | ICD-10-CM | POA: Insufficient documentation

## 2021-08-26 DIAGNOSIS — E162 Hypoglycemia, unspecified: Secondary | ICD-10-CM

## 2021-08-26 LAB — CBC WITH DIFFERENTIAL/PLATELET
Abs Immature Granulocytes: 0.09 10*3/uL — ABNORMAL HIGH (ref 0.00–0.07)
Basophils Absolute: 0 10*3/uL (ref 0.0–0.1)
Basophils Relative: 0 %
Eosinophils Absolute: 0 10*3/uL (ref 0.0–0.5)
Eosinophils Relative: 0 %
HCT: 43.9 % (ref 39.0–52.0)
Hemoglobin: 14.6 g/dL (ref 13.0–17.0)
Immature Granulocytes: 1 %
Lymphocytes Relative: 7 %
Lymphs Abs: 1 10*3/uL (ref 0.7–4.0)
MCH: 31.8 pg (ref 26.0–34.0)
MCHC: 33.3 g/dL (ref 30.0–36.0)
MCV: 95.6 fL (ref 80.0–100.0)
Monocytes Absolute: 0.5 10*3/uL (ref 0.1–1.0)
Monocytes Relative: 3 %
Neutro Abs: 12.6 10*3/uL — ABNORMAL HIGH (ref 1.7–7.7)
Neutrophils Relative %: 89 %
Platelets: 265 10*3/uL (ref 150–400)
RBC: 4.59 MIL/uL (ref 4.22–5.81)
RDW: 12.3 % (ref 11.5–15.5)
WBC: 14.2 10*3/uL — ABNORMAL HIGH (ref 4.0–10.5)
nRBC: 0 % (ref 0.0–0.2)

## 2021-08-26 LAB — ETHANOL: Alcohol, Ethyl (B): <10 mg/dL (ref <10)

## 2021-08-26 LAB — COMPREHENSIVE METABOLIC PANEL
ALT: 27 U/L (ref 0–44)
AST: 25 U/L (ref 15–41)
Albumin: 4 g/dL (ref 3.5–5.0)
Alkaline Phosphatase: 48 U/L (ref 38–126)
Anion gap: 10 (ref 5–15)
BUN: 11 mg/dL (ref 6–20)
CO2: 24 mmol/L (ref 22–32)
Calcium: 9.2 mg/dL (ref 8.9–10.3)
Chloride: 103 mmol/L (ref 98–111)
Creatinine, Ser: 1.06 mg/dL (ref 0.61–1.24)
GFR, Estimated: >60 mL/min (ref >60)
Glucose, Bld: 83 mg/dL (ref 70–99)
Potassium: 3.8 mmol/L (ref 3.5–5.1)
Sodium: 137 mmol/L (ref 135–145)
Total Bilirubin: 1.1 mg/dL (ref 0.3–1.2)
Total Protein: 6.7 g/dL (ref 6.5–8.1)

## 2021-08-26 LAB — LACTIC ACID, PLASMA: Lactic Acid, Venous: 1.2 mmol/L (ref 0.5–1.9)

## 2021-08-26 LAB — CBG MONITORING, ED
Glucose-Capillary: 165 mg/dL — ABNORMAL HIGH (ref 70–99)
Glucose-Capillary: 168 mg/dL — ABNORMAL HIGH (ref 70–99)
Glucose-Capillary: 217 mg/dL — ABNORMAL HIGH (ref 70–99)
Glucose-Capillary: 223 mg/dL — ABNORMAL HIGH (ref 70–99)
Glucose-Capillary: 77 mg/dL (ref 70–99)

## 2021-08-26 LAB — RESP PANEL BY RT-PCR (FLU A&B, COVID) ARPGX2
Influenza A by PCR: NEGATIVE
Influenza B by PCR: NEGATIVE
SARS Coronavirus 2 by RT PCR: NEGATIVE

## 2021-08-26 LAB — TSH: TSH: 1.821 u[IU]/mL (ref 0.350–4.500)

## 2021-08-26 LAB — LIPASE, BLOOD: Lipase: 24 U/L (ref 11–51)

## 2021-08-26 MED ORDER — DROPERIDOL 2.5 MG/ML IJ SOLN
1.2500 mg | Freq: Once | INTRAMUSCULAR | Status: AC
  Administered 2021-08-26: 1.25 mg via INTRAVENOUS
  Filled 2021-08-26: qty 2

## 2021-08-26 MED ORDER — SODIUM CHLORIDE 0.9% FLUSH: 3.0000 mL | Freq: Two times a day (BID) | INTRAVENOUS | Status: DC

## 2021-08-26 MED ORDER — SODIUM CHLORIDE 0.9% FLUSH: 3.0000 mL | INTRAVENOUS | Status: DC | PRN

## 2021-08-26 MED ORDER — SODIUM CHLORIDE 0.9 % IV BOLUS
1000.0000 mL | Freq: Once | INTRAVENOUS | Status: AC
  Administered 2021-08-26: 1000 mL via INTRAVENOUS

## 2021-08-26 MED ORDER — SODIUM CHLORIDE 0.9 % IV SOLN: 250.0000 mL | INTRAVENOUS | Status: DC | PRN

## 2021-08-26 NOTE — ED Provider Notes (Signed)
I received care of this patient from Dr Ricke Hey.  This is a 35 year old type I diabetic who is on insulin at home presenting to the emergency department with nausea and vomiting and an episode of hypoglycemia.  The patient reports to me that he ran out of his short-acting insulin yesterday.  To make up for that he took extra Lantus this morning, 35 units instead of his usual 25 units.  He subsequently began to feel very unwell.  EMS reported he had a blood sugar level of 66 on scene.  The patient received 4 mg of Zofran for vomiting on arrival.  His blood sugars have since stabilized.  The patient reports that he feels generally unwell, nauseated.  He denies any recent fevers or infectious symptoms.  He was given 250 cc D10 here, and now is receiving a liter of IV fluids  Due to his confusion on arrival, CT scan of the brain was obtained, which was unremarkable.  Subsequently, upon my evaluation after 3 hours in the ED, the patient is now mentating well and able to answer questions coherently.  He simply states he feels tired.   Physical Exam  BP (!) 151/93   Pulse (!) 107   Temp 98.6 F (37 C) (Oral)   Resp (!) 8   Ht 5\' 11"  (1.803 m)   Wt 83 kg   SpO2 97%   BMI 25.52 kg/m   Physical Exam Constitutional:      General: He is not in acute distress.    Comments: Appears tired  HENT:     Head: Normocephalic and atraumatic.  Eyes:     Conjunctiva/sclera: Conjunctivae normal.     Pupils: Pupils are equal, round, and reactive to light.  Cardiovascular:     Rate and Rhythm: Regular rhythm. Tachycardia present.  Pulmonary:     Effort: Pulmonary effort is normal. No respiratory distress.  Abdominal:     General: There is no distension.     Tenderness: There is no abdominal tenderness. There is no guarding.  Skin:    General: Skin is warm and dry.  Neurological:     General: No focal deficit present.     Mental Status: He is alert. Mental status is at baseline.  Psychiatric:         Mood and Affect: Mood normal.        Behavior: Behavior normal.    ED Course/Procedures     Procedures  MDM   Patient is here with transient hypoglycemia after taking extra long acting insulin this morning.  I personally reviewed and interpreted his labs, CT scan, chest x-ray.  No evidence of stroke or intracranial injury.  CMP is unremarkable.  No anion gap.  Blood sugar on repeat check was 168.  Abdominal labs are unremarkable.  Lipase within normal limits.  Chest x-ray does not show any focal infiltrate or evidence of infection.  CBC does show some leukocytosis WBC 14.2, which may be reactive.  We will add a covid/flu swab, check an ECG for Qtc, and continue to provide hydration, glucose check, and IV fluids.  The patient is symptomatically improved and his vitals otherwise look good, he could be discharged home (he picked up his insulin today).  If he continues to require continue antiemetics or cannot keep down fluids, he may require admission.  1030 pm -patient's EKG shows a sinus tachycardia which is consistent with his prior tracings.  QTC is within normal limits.  I have ordered IV droperidol to  help with nausea and vomiting.    11:20 pm - patient signed out to Dr Blinda Leatherwood pending reassessment after nausea medications.     Terald Sleeper, MD 08/27/21 [phone]

## 2021-08-26 NOTE — ED Notes (Signed)
4mg  zofran given IV for active emesis, MD Tegeler aware

## 2021-08-26 NOTE — ED Provider Notes (Signed)
[facility] EMERGENCY DEPARTMENT Provider Note   CSN: [id number] Arrival date & time: 08/26/21  1830     History Chief Complaint  Patient presents with   Hypoglycemia    Zachary Vazquez is a 35 y.o. male.  The history is provided by the patient and medical records. No language interpreter was used.  Hypoglycemia Initial blood sugar:  60's Severity:  Moderate Onset quality:  Gradual Duration:  1 day Timing:  Constant Progression:  Unchanged Chronicity:  New Associated symptoms: vomiting   Associated symptoms: no shortness of breath and no sweats       Past Medical History:  Diagnosis Date   History of delayed wound healing 10/2019   Rash and nonspecific skin eruption 10/2019   Type 1 diabetes mellitus (HCC)    Vitamin D deficiency 09/2020   Wound of lower extremity, left, sequela 10/2019    Patient Active Problem List   Diagnosis Date Noted   Gastritis 05/14/2020   Diabetic gastroparesis (HCC) 05/14/2020   Uncontrolled type 1 diabetes mellitus with ketoacidosis without coma, with long-term current use of insulin (HCC) 05/10/2020   SIRS (systemic inflammatory response syndrome) (HCC) 05/10/2020   Nicotine dependence, cigarettes, uncomplicated 05/10/2020   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   Hyperglycemia 04/27/2020   History of delayed wound healing 04/16/2020   Hemoglobin A1c less than 7.0% 04/16/2020   Allergies 04/16/2020   Rash and nonspecific skin eruption 11/23/2019   Spider bite 11/18/2019   Wound of left leg 11/18/2019   Intractable nausea and vomiting 05/04/2019   DKA, type 1 (HCC) 05/04/2019   DKA (diabetic ketoacidosis) (HCC) 05/04/2019   Nausea 12/02/2017   Tobacco use disorder 03/30/2017   Type 1 diabetes mellitus (HCC) 03/30/2017   Leukocytosis 03/30/2017    Past Surgical History:  Procedure Laterality Date   TONSILLECTOMY         Family History  Problem Relation Age of Onset   Hypertension Mother    Diabetes Mellitus  I Brother     Social History   Tobacco Use   Smoking status: Every Day    Packs/day: 0.25    Years: 15.00    Pack years: 3.75    Types: Cigarettes   Smokeless tobacco: Never  Vaping Use   Vaping Use: Never used  Substance Use Topics   Alcohol use: Not Currently    Comment: occ   Drug use: Not Currently    Types: Marijuana    Comment: occ    Home Medications Prior to Admission medications   Medication Sig Start Date End Date Taking? Authorizing Provider  desonide (DESOWEN) 0.05 % lotion APPLY 1 SMALL AMOUNT TO AFFECTED AREA TWICE A DAY. 11/22/20 11/22/21  Wynona Canes, MD  desonide (DESOWEN) 0.05 % ointment APPLY 1 A SMALL AMOUNT TO AFFECTED AREA ONCE A DAY 11/22/20 11/22/21  Wynona Canes, MD  fluocinonide cream (LIDEX) 0.05 % APPLY 1 APPLICATION TOPICALLY 2 (TWO) TIMES DAILY. 10/11/20 10/11/21  [name], FNP  ibuprofen (ADVIL) 400 MG tablet TAKE 1 TO 2 TABLETS BY MOUTH 3 TO 4 TIMES DAILY FOR PAIN 08/28/20 08/28/21    insulin aspart (NOVOLOG) 100 UNIT/ML injection Use as directed per sliding scale 06/11/21   Barbette Merino, NP  insulin glargine (LANTUS) 100 UNIT/ML injection Inject 0.35 mLs (35 Units total) into the skin at bedtime. 07/13/21 07/13/22  Barbette Merino, NP  pantoprazole (PROTONIX) 40 MG tablet TAKE 1 TABLET BY MOUTH DAILY. 04/16/21 05/07/21    Vitamin D, Ergocalciferol, (  DRISDOL) 1.25 MG (50000 UNIT) CAPS capsule TAKE 1 CAPSULE (50,000 UNITS TOTAL) BY MOUTH EVERY 7 (SEVEN) DAYS. 10/13/20 10/13/21  [name], FNP    Allergies    Clindamycin/lincomycin and Bactrim [sulfamethoxazole-trimethoprim]  Review of Systems   Review of Systems  Constitutional:  Negative for chills, diaphoresis, fatigue and fever.  HENT:  Negative for congestion.   Eyes:  Negative for visual disturbance.  Respiratory:  Negative for cough, chest tightness, shortness of breath and wheezing.   Cardiovascular:  Negative for chest pain and palpitations.  Gastrointestinal:  Positive  for nausea and vomiting. Negative for abdominal pain, constipation and diarrhea.  Genitourinary:  Negative for dysuria and flank pain.  Musculoskeletal:  Negative for back pain and neck pain.  Skin:  Negative for rash and wound.  Neurological:  Positive for light-headedness and headaches.  Psychiatric/Behavioral:  Positive for confusion. Negative for agitation.   All other systems reviewed and are negative.  Physical Exam Updated Vital Signs BP (!) 155/92 (BP Location: Right Arm)   Pulse 99   Temp 98.6 F (37 C) (Oral)   Resp 20   Ht 5\' 11"  (1.803 m)   Wt 83 kg   SpO2 95%   BMI 25.52 kg/m   Physical Exam Vitals and nursing note reviewed.  Constitutional:      General: He is not in acute distress.    Appearance: He is well-developed. He is ill-appearing. He is not toxic-appearing or diaphoretic.  HENT:     Head: Normocephalic and atraumatic.     Mouth/Throat:     Mouth: Mucous membranes are dry.     Pharynx: No oropharyngeal exudate or posterior oropharyngeal erythema.  Eyes:     Conjunctiva/sclera: Conjunctivae normal.     Pupils: Pupils are equal, round, and reactive to light.  Cardiovascular:     Rate and Rhythm: Normal rate and regular rhythm.     Heart sounds: No murmur heard. Pulmonary:     Effort: Pulmonary effort is normal. No respiratory distress.     Breath sounds: Normal breath sounds. No wheezing, rhonchi or rales.  Chest:     Chest wall: No tenderness.  Abdominal:     General: Abdomen is flat.     Palpations: Abdomen is soft.     Tenderness: There is no abdominal tenderness. There is no guarding.  Musculoskeletal:        General: No tenderness.     Cervical back: Neck supple. No tenderness.  Skin:    General: Skin is warm and dry.     Capillary Refill: Capillary refill takes less than 2 seconds.     Findings: No erythema.  Neurological:     Mental Status: He is alert.     GCS: GCS eye subscore is 3. GCS verbal subscore is 5. GCS motor subscore is 6.      Cranial Nerves: No dysarthria.     Sensory: No sensory deficit.     Motor: No weakness, tremor or seizure activity.     Coordination: Coordination normal.    ED Results / Procedures / Treatments   Labs (all labs ordered are listed, but only abnormal results are displayed) Labs Reviewed  CBC WITH DIFFERENTIAL/PLATELET - Abnormal; Notable for the following components:      Result Value   WBC 14.2 (*)    Neutro Abs 12.6 (*)    Abs Immature Granulocytes 0.09 (*)    All other components within normal limits  CBG MONITORING, ED - Abnormal; Notable for  the following components:   Glucose-Capillary 165 (*)    All other components within normal limits  CBG MONITORING, ED - Abnormal; Notable for the following components:   Glucose-Capillary 168 (*)    All other components within normal limits  URINE CULTURE  COMPREHENSIVE METABOLIC PANEL  LACTIC ACID, PLASMA  LIPASE, BLOOD  ETHANOL  TSH  AMMONIA  LACTIC ACID, PLASMA  URINALYSIS, ROUTINE W REFLEX MICROSCOPIC  RAPID URINE DRUG SCREEN, HOSP PERFORMED  CBG MONITORING, ED    EKG None  Radiology DG Chest Portable 1 View  Result Date: 08/26/2021 CLINICAL DATA:  Altered mental status, vomiting EXAM: PORTABLE CHEST 1 VIEW COMPARISON:  05/10/2020 FINDINGS: Lungs are clear.  No pleural effusion or pneumothorax. Heart is normal in size. IMPRESSION: No evidence of acute cardiopulmonary disease. Electronically Signed   By: Charline Bills M.D.   On: 08/26/2021 19:39    Procedures Procedures   Medications Ordered in ED Medications  sodium chloride 0.9 % bolus 1,000 mL (has no administration in time range)    ED Course  I have reviewed the triage vital signs and the nursing notes.  Pertinent labs & imaging results that were available during my care of the patient were reviewed by me and considered in my medical decision making (see chart for details).    MDM Rules/Calculators/A&P                           Jaser Fullen is  a 35 y.o. male with a past medical history significant for type 1 diabetes with previous DKA, previous Mallory-Weiss tear, and diabetic gastroparesis who presents with nausea, vomiting, and fatigue.  According to patient, his glucoses were in the 500s yesterday and this morning he has been having nonstop nausea and vomiting and fatigue.  He feels very lightheaded.  He denies any chest pain, abdominal pain, headaches, or neck pain.  Denies any back or flank pain.  Reports that he thought he was in DKA but was found to have glucose in the 60s with EMS.  He was brought in for evaluation.  Patient is somnolent but arousable to voice.    On exam, pupils are symmetric and reactive normal extract movements.  Lungs are clear.  Chest and abdomen are nontender.  No focal neurologic deficits as he is able to move all extremities with normal sensation and strength.  He was having nausea and vomiting and was given some Zofran and given some glucose.  Patient denies any infectious symptoms otherwise.  Denies any vision changes, speech difficulties, or headaches.  Clinically, I am concerned about the patient's fluctuating glucoses as he reports they are in the 500s yesterday and he does not know how low they were today.  Given his somnolence and altered mental status, will get work-up to look for occult infection, electrolyte abnormalities, and also get a CT head to look for evidence of cerebral edema with the quick glucose changes.  Care will be transferred to oncoming team while waiting for results of his work-up.  Due to the altered mental status, if symptoms not improve, anticipate he may need admission for further management.   Final Clinical Impression(s) / ED Diagnoses Final diagnoses:  Altered mental status, unspecified altered mental status type  Hypoglycemia  Nausea and vomiting, unspecified vomiting type     Clinical Impression: 1. Altered mental status, unspecified altered mental status type   2.  Hypoglycemia   3. Nausea and vomiting, unspecified vomiting type  Disposition: Care transferred to oncoming team while awaiting further work-up to be completed.  This note was prepared with assistance of Conservation officer, historic buildings. Occasional wrong-word or sound-a-like substitutions may have occurred due to the inherent limitations of voice recognition software.     Yekaterina Escutia, Canary Brim, MD 08/26/21 2150

## 2021-08-26 NOTE — ED Triage Notes (Signed)
Pt BIB [facility] for eval hypoglycemia of 66. EMS unable to obtain IV access. Pt called EMS out for N/V and hypoglycemia. No treatment in the field by EMS.

## 2021-08-26 NOTE — Care Management (Signed)
Patient has been active with [facility]  for primary care. Last visit 07/13/21 by Thad Ranger NP.  According to notes patient have been working with the Clinic's LCSW Abigail Butts to obtain [employer] to assist with his health care needs.

## 2021-08-26 NOTE — ED Notes (Signed)
250cc D10 infusing for CBG 77, MD Tegeler aware

## 2021-08-26 NOTE — ED Notes (Signed)
Pt in c-t vomiting on the scannedr.  Zofran 4mg  given iv per verbal order from dr tegeler

## 2021-08-27 DIAGNOSIS — R112 Nausea with vomiting, unspecified: Secondary | ICD-10-CM

## 2021-08-27 DIAGNOSIS — G9341 Metabolic encephalopathy: Secondary | ICD-10-CM

## 2021-08-27 DIAGNOSIS — E162 Hypoglycemia, unspecified: Secondary | ICD-10-CM

## 2021-08-27 LAB — CBC
HCT: 38.2 % — ABNORMAL LOW (ref 39.0–52.0)
Hemoglobin: 13 g/dL (ref 13.0–17.0)
MCH: 31.8 pg (ref 26.0–34.0)
MCHC: 34 g/dL (ref 30.0–36.0)
MCV: 93.4 fL (ref 80.0–100.0)
Platelets: 249 10*3/uL (ref 150–400)
RBC: 4.09 MIL/uL — ABNORMAL LOW (ref 4.22–5.81)
RDW: 12.4 % (ref 11.5–15.5)
WBC: 13.5 10*3/uL — ABNORMAL HIGH (ref 4.0–10.5)
nRBC: 0 % (ref 0.0–0.2)

## 2021-08-27 LAB — CBG MONITORING, ED
Glucose-Capillary: 233 mg/dL — ABNORMAL HIGH (ref 70–99)
Glucose-Capillary: 266 mg/dL — ABNORMAL HIGH (ref 70–99)
Glucose-Capillary: 171 mg/dL — ABNORMAL HIGH (ref 70–99)

## 2021-08-27 LAB — URINALYSIS, ROUTINE W REFLEX MICROSCOPIC
Bilirubin Urine: NEGATIVE
Glucose, UA: 50 mg/dL — AB
Hgb urine dipstick: NEGATIVE
Ketones, ur: 20 mg/dL — AB
Leukocytes,Ua: NEGATIVE
Nitrite: NEGATIVE
Protein, ur: NEGATIVE mg/dL
Specific Gravity, Urine: 1.017 (ref 1.005–1.030)
pH: 7 (ref 5.0–8.0)

## 2021-08-27 LAB — RAPID URINE DRUG SCREEN, HOSP PERFORMED
Amphetamines: NOT DETECTED
Barbiturates: NOT DETECTED
Benzodiazepines: NOT DETECTED
Cocaine: NOT DETECTED
Opiates: NOT DETECTED
Tetrahydrocannabinol: NOT DETECTED

## 2021-08-27 LAB — HIV ANTIBODY (ROUTINE TESTING W REFLEX): HIV Screen 4th Generation wRfx: NONREACTIVE

## 2021-08-27 MED ORDER — ACETAMINOPHEN 325 MG PO TABS: 650.0000 mg | ORAL_TABLET | Freq: Four times a day (QID) | ORAL | Status: DC | PRN

## 2021-08-27 MED ORDER — ENOXAPARIN SODIUM 40 MG/0.4ML IJ SOSY: 40.0000 mg | PREFILLED_SYRINGE | INTRAMUSCULAR | Status: DC

## 2021-08-27 MED ORDER — PANTOPRAZOLE SODIUM 40 MG IV SOLR
40.0000 mg | Freq: Every day | INTRAVENOUS | Status: DC
  Administered 2021-08-27: 40 mg via INTRAVENOUS
  Filled 2021-08-27: qty 40

## 2021-08-27 MED ORDER — SODIUM CHLORIDE 0.9 % IV SOLN: INTRAVENOUS | Status: DC

## 2021-08-27 MED ORDER — ACETAMINOPHEN 650 MG RE SUPP: 650.0000 mg | Freq: Four times a day (QID) | RECTAL | Status: DC | PRN

## 2021-08-27 MED ORDER — NICOTINE 7 MG/24HR TD PT24
7.0000 mg | MEDICATED_PATCH | Freq: Every day | TRANSDERMAL | Status: DC
  Filled 2021-08-27: qty 1

## 2021-08-27 MED ORDER — INSULIN ASPART 100 UNIT/ML IJ SOLN
0.0000 [IU] | INTRAMUSCULAR | Status: DC
Start: 2021-08-27 — End: 2021-08-27
  Administered 2021-08-27: 1 [IU] via SUBCUTANEOUS
  Administered 2021-08-27: 3 [IU] via SUBCUTANEOUS

## 2021-08-27 MED ORDER — DEXTROSE 50 % IV SOLN: 12.5000 g | INTRAVENOUS | Status: DC | PRN

## 2021-08-27 MED ORDER — METOCLOPRAMIDE HCL 5 MG/ML IJ SOLN
10.0000 mg | Freq: Three times a day (TID) | INTRAMUSCULAR | Status: DC
  Administered 2021-08-27: 10 mg via INTRAVENOUS
  Filled 2021-08-27: qty 2

## 2021-08-27 MED ORDER — ONDANSETRON HCL 4 MG/2ML IJ SOLN
4.0000 mg | Freq: Four times a day (QID) | INTRAMUSCULAR | Status: DC | PRN
Start: 2021-08-27 — End: 2021-08-27

## 2021-08-27 NOTE — ED Notes (Signed)
Discussed risks of leaving AMA. Pt voiced understanding. Pt called cab to come get him. Pt aware of risks of leaving AMA and MD notified of pt wish to leave.

## 2021-08-27 NOTE — ED Provider Notes (Signed)
Patient signed out to me by previous ER physician.  Patient with type 1 diabetes and gastroparesis presents to the emergency department with intractable nausea and vomiting.  He did have very elevated blood sugars earlier today, took additional insulin and presented with hypoglycemia.  Patient's blood sugars have been corrected here in the department but he continues to have nausea and vomiting, inability to take an oral fluids despite multiple antiemetics.  Will require hospitalization for further management.   Gilda Crease, MD 08/27/21 0200

## 2021-08-27 NOTE — ED Notes (Signed)
Pt had taken off leads and turned off monitor in room. He states "I need you to go ahead and write up my discharge papers." Explained that the provider had to do that and even still he would be leaving AMA. Pt voiced understanding and reports that he has dentist appointment at 1500 today that he cannot miss. Have messaged doctor

## 2021-08-27 NOTE — H&P (Signed)
History and Physical    Zachary Vazquez LYY:[id number] DOB: Jul 24, 1986 DOA: 08/26/2021  PCP: No primary care provider on file. Patient coming from: Home  Chief Complaint: Low blood glucose, nausea and vomiting  HPI: Zachary Vazquez is a 35 y.o. male with medical history significant of type 1 diabetes with prior DKA, gastroparesis, Mallory-Weiss tear, tobacco use presents to the ED with intractable nausea and vomiting.  His blood glucose was elevated earlier today and he took additional insulin and presented with hypoglycemia.  Blood glucose in the 60s prior to arrival.  Confused on arrival to the ED.  Slightly tachycardic in the ED.  Not febrile.  Labs showing WBC 14.2, hemoglobin 14.6, platelet count 265k.  Sodium 137, potassium 3.8, chloride 105, bicarb 24, BUN 11, creatinine 1.1, glucose.  Lipase and LFTs normal.  Blood ethanol level undetectable.  TSH normal.  Lactic acid 1.2.  COVID and influenza PCR negative.  UA without signs of infection.  UDS negative.  Chest x-ray showing no active cardiopulmonary disease.  CT head negative for acute intracranial abnormality. Patient was given droperidol and 2 L fluid boluses.  Continued to have vomiting and was not able to tolerate p.o. intake. Glucose improved to 200s on subsequent CBG checks.  Mental status improved.  Patient states he takes Lantus 30 units daily along with Novolin sliding scale insulin.  He ran out of Novolin yesterday and his blood sugar was high in the 400s so he took Lantus 45 units last night.  This morning he was able to go to [facility] and pick up Novolin and took 10 units after breakfast.  He does not remember how low his blood sugar was with EMS.  Also reporting nausea and vomiting since yesterday and has not been able to tolerate p.o. intake.  He does not think he vomited blood.  Denies fevers, abdominal pain, or diarrhea.  He last smoked marijuana a month ago.  No other complaints.  Denies chest pain or shortness of breath.  Review  of Systems:  All systems reviewed and apart from history of presenting illness, are negative.  Past Medical History:  Diagnosis Date   History of delayed wound healing 10/2019   Rash and nonspecific skin eruption 10/2019   Type 1 diabetes mellitus (HCC)    Vitamin D deficiency 09/2020   Wound of lower extremity, left, sequela 10/2019    Past Surgical History:  Procedure Laterality Date   TONSILLECTOMY       reports that he has been smoking cigarettes. He has a 3.75 pack-year smoking history. He has never used smokeless tobacco. He reports that he does not currently use alcohol. He reports that he does not currently use drugs after having used the following drugs: Marijuana.  Allergies  Allergen Reactions   Clindamycin/Lincomycin Anaphylaxis   Bactrim [Sulfamethoxazole-Trimethoprim] Swelling and Other (See Comments)    Eyes swell and bumps appear on/near the eyes     Family History  Problem Relation Age of Onset   Hypertension Mother    Diabetes Mellitus I Brother     Prior to Admission medications   Medication Sig Start Date End Date Taking? Authorizing Provider  acetaminophen (TYLENOL) 500 MG tablet Take 1,000 mg by mouth every 6 (six) hours as needed for moderate pain or headache.   Yes [provider]  insulin aspart (NOVOLOG) 100 UNIT/ML injection Use as directed per sliding scale Patient taking differently: Inject 0-10 Units into the skin See admin instructions. Per sliding scale 3 times daily with meals  06/11/21  Yes Barbette Merino, NP  insulin glargine (LANTUS) 100 UNIT/ML injection Inject 0.35 mLs (35 Units total) into the skin at bedtime. 07/13/21 07/13/22 Yes King, Crystal M, NP  desonide (DESOWEN) 0.05 % lotion APPLY 1 SMALL AMOUNT TO AFFECTED AREA TWICE A DAY. Patient not taking: Reported on 08/27/2021 11/22/20 11/22/21  Wynona Canes, MD  desonide (DESOWEN) 0.05 % ointment APPLY 1 A SMALL AMOUNT TO AFFECTED AREA ONCE A DAY Patient not taking: Reported on  08/27/2021 11/22/20 11/22/21  Wynona Canes, MD  fluocinonide cream (LIDEX) 0.05 % APPLY 1 APPLICATION TOPICALLY 2 (TWO) TIMES DAILY. Patient not taking: Reported on 08/27/2021 10/11/20 10/11/21  [name], FNP  ibuprofen (ADVIL) 400 MG tablet TAKE 1 TO 2 TABLETS BY MOUTH 3 TO 4 TIMES DAILY FOR PAIN Patient not taking: Reported on 08/27/2021 08/28/20 08/28/21    pantoprazole (PROTONIX) 40 MG tablet TAKE 1 TABLET BY MOUTH DAILY. Patient not taking: Reported on 08/27/2021 04/16/21 05/07/21    Vitamin D, Ergocalciferol, (DRISDOL) 1.25 MG (50000 UNIT) CAPS capsule TAKE 1 CAPSULE (50,000 UNITS TOTAL) BY MOUTH EVERY 7 (SEVEN) DAYS. Patient not taking: Reported on 08/27/2021 10/13/20 10/13/21  [name], FNP    Physical Exam: Vitals:   08/27/21 0000 08/27/21 0030 08/27/21 0100 08/27/21 0200  BP: 133/83 127/73 131/76 137/80  Pulse: 98 (!) 101 (!) 102 (!) 103  Resp: 14 13 16  (!) 0  Temp:      TempSrc:      SpO2: 96% 97% 97% 97%  Weight:      Height:        Physical Exam Constitutional:      General: He is not in acute distress.    Comments: Appears lethargic  HENT:     Head: Normocephalic and atraumatic.  Eyes:     Extraocular Movements: Extraocular movements intact.     Conjunctiva/sclera: Conjunctivae normal.  Cardiovascular:     Rate and Rhythm: Normal rate and regular rhythm.     Pulses: Normal pulses.  Pulmonary:     Effort: Pulmonary effort is normal. No respiratory distress.     Breath sounds: Normal breath sounds. No wheezing or rales.  Abdominal:     General: Bowel sounds are normal. There is no distension.     Palpations: Abdomen is soft.     Tenderness: There is no abdominal tenderness. There is no guarding or rebound.  Musculoskeletal:        General: No swelling or tenderness.     Cervical back: Normal range of motion and neck supple.  Skin:    General: Skin is warm and dry.  Neurological:     General: No focal deficit present.     Mental Status: He is  alert and oriented to person, place, and time.     Labs on Admission: I have personally reviewed following labs and imaging studies  CBC: Recent Labs  Lab 08/26/21 1950  WBC 14.2*  NEUTROABS 12.6*  HGB 14.6  HCT 43.9  MCV 95.6  PLT 265   Basic Metabolic Panel: Recent Labs  Lab 08/26/21 1950  NA 137  K 3.8  CL 103  CO2 24  GLUCOSE 83  BUN 11  CREATININE 1.06  CALCIUM 9.2   GFR: Estimated Creatinine Clearance: 104.6 mL/min (by C-G formula based on SCr of 1.06 mg/dL). Liver Function Tests: Recent Labs  Lab 08/26/21 1950  AST 25  ALT 27  ALKPHOS 48  BILITOT 1.1  PROT 6.7  ALBUMIN  4.0   Recent Labs  Lab 08/26/21 1950  LIPASE 24   No results for input(s): AMMONIA in the last 168 hours. Coagulation Profile: No results for input(s): INR, PROTIME in the last 168 hours. Cardiac Enzymes: No results for input(s): CKTOTAL, CKMB, CKMBINDEX, TROPONINI in the last 168 hours. BNP (last 3 results) No results for input(s): PROBNP in the last 8760 hours. HbA1C: No results for input(s): HGBA1C in the last 72 hours. CBG: Recent Labs  Lab 08/26/21 2004 08/26/21 2105 08/26/21 2249 08/26/21 2351 08/27/21 0103  GLUCAP 165* 168* 217* 223* 233*   Lipid Profile: No results for input(s): CHOL, HDL, LDLCALC, TRIG, CHOLHDL, LDLDIRECT in the last 72 hours. Thyroid Function Tests: Recent Labs    08/26/21 1950  TSH 1.821   Anemia Panel: No results for input(s): VITAMINB12, FOLATE, FERRITIN, TIBC, IRON, RETICCTPCT in the last 72 hours. Urine analysis:    Component Value Date/Time   COLORURINE YELLOW 08/27/2021 0106   APPEARANCEUR CLEAR 08/27/2021 0106   APPEARANCEUR Clear 10/11/2020 1442   LABSPEC 1.017 08/27/2021 0106   PHURINE 7.0 08/27/2021 0106   GLUCOSEU 50 (A) 08/27/2021 0106   HGBUR NEGATIVE 08/27/2021 0106   BILIRUBINUR NEGATIVE 08/27/2021 0106   BILIRUBINUR negative 07/13/2021 1101   BILIRUBINUR neg 01/09/2021 1044   BILIRUBINUR Negative 10/11/2020 1442    KETONESUR 20 (A) 08/27/2021 0106   PROTEINUR NEGATIVE 08/27/2021 0106   UROBILINOGEN 1.0 07/13/2021 1101   UROBILINOGEN 0.2 01/19/2018 0838   NITRITE NEGATIVE 08/27/2021 0106   LEUKOCYTESUR NEGATIVE 08/27/2021 0106    Radiological Exams on Admission: CT HEAD WO CONTRAST ( )  Result Date: 08/26/2021 CLINICAL DATA:  Mental status change.  Unknown cause. EXAM: CT HEAD WITHOUT CONTRAST TECHNIQUE: Contiguous axial images were obtained from the base of the skull through the vertex without intravenous contrast. COMPARISON:  None. FINDINGS: Brain: No evidence of large-territorial acute infarction. No parenchymal hemorrhage. No mass lesion. No extra-axial collection. No mass effect or midline shift. No hydrocephalus. Basilar cisterns are patent. Vascular: No hyperdense vessel. Skull: No acute fracture or focal lesion. Sinuses/Orbits: Paranasal sinuses and mastoid air cells are clear. The orbits are unremarkable. Other: None. IMPRESSION: No acute intracranial abnormality. Electronically Signed   By: Tish Frederickson M.D.   On: 08/26/2021 21:48   DG Chest Portable 1 View  Result Date: 08/26/2021 CLINICAL DATA:  Altered mental status, vomiting EXAM: PORTABLE CHEST 1 VIEW COMPARISON:  05/10/2020 FINDINGS: Lungs are clear.  No pleural effusion or pneumothorax. Heart is normal in size. IMPRESSION: No evidence of acute cardiopulmonary disease. Electronically Signed   By: Charline Bills M.D.   On: 08/26/2021 19:39    EKG: Independently reviewed.  Sinus tachycardia, no significant change compared to prior tracings.  Assessment/Plan Principal Problem:   Intractable nausea and vomiting Active Problems:   Type 1 diabetes mellitus (HCC)   Leukocytosis   Hypoglycemia   Acute metabolic encephalopathy   Intractable nausea and vomiting Likely due to diabetic gastroparesis.  Lipase and LFTs normal.  Abdominal exam benign.  No recent marijuana use, UDS negative.  Blood ethanol level undetectable.   COVID-negative.  No signs of UTI.  Prior history of Mallory-Weiss tear but no hematemesis at this time and hemoglobin stable. -IV Reglan scheduled, IV Zofran as needed.  Continue IV fluid hydration.  Trial of IV Protonix for possible GERD.  Type 1 diabetes Hypoglycemia Patient having poor oral intake in setting of intractable nausea and vomiting.  His blood glucose was elevated at home and he took a  higher dose of his basal insulin than usual.  Blood glucose in the 60s prior to ED arrival.  Now improved to 200s.  Likely received dextrose by EMS as he has not received it in the ED.  A1c 5.5 on 07/13/2021. -Very sensitive sliding scale insulin every 4 hours.  Regular diet and hypoglycemia protocol.  Acute metabolic encephalopathy Likely due to hypoglycemia.  Initially confused but now improved after improvement of blood glucose.  Head CT negative for acute intracranial abnormality.  No focal neurodeficit. -Continue to monitor blood glucose closely  Mild leukocytosis Likely reactive.  No fever or lactic acidosis.  UA without signs of infection.  Chest x-ray not suggestive of pneumonia. -Repeat CBC  Tobacco use -NicoDerm patch and counseling  DVT prophylaxis: SCDs Code Status: Full code Family Communication: Diagnostic findings and treatment plan discussed with the patient.  No family at bedside. Disposition Plan: Status is: Observation  The patient remains OBS appropriate and will d/c before 2 midnights.  Level of care: Level of care: Med-Surg  The medical decision making on this patient was of high complexity and the patient is at high risk for clinical deterioration, therefore this is a level 3 visit.  John Giovanni MD [facility]  If 7PM-7AM, please contact night-coverage [email or web address]  08/27/2021, 2:42 AM

## 2021-08-27 NOTE — Discharge Summary (Signed)
Patient was admitted this morning.  Before I could see the patient patient had left AMA.  Patient is 35 years old with type 1 diabetes presented to hospital with intractable nausea vomiting.  Patient was given IV Reglan Zofran IV fluids and felt better and decided to leave AGAINST MEDICAL ADVICE.  Patient was alert awake and oriented at the time of discharge as per the nursing staff.  Patient had stated an dental appointment he could not miss.  I spoke with the patient's nurse and given an option to refill his insulin which he said he had enough supply.  He was advised to come back to the hospital if his symptoms were worse.  Please refer to the history and physical dictated 08/27/2021 for further details.

## 2021-08-30 ENCOUNTER — Other Ambulatory Visit: Payer: Self-pay

## 2021-08-31 ENCOUNTER — Other Ambulatory Visit: Payer: Self-pay

## 2021-10-08 ENCOUNTER — Other Ambulatory Visit: Payer: Self-pay

## 2021-10-09 ENCOUNTER — Other Ambulatory Visit: Payer: Self-pay

## 2021-10-10 ENCOUNTER — Other Ambulatory Visit: Payer: Self-pay

## 2021-10-12 ENCOUNTER — Other Ambulatory Visit: Payer: Self-pay

## 2021-10-12 ENCOUNTER — Encounter: Payer: Self-pay | Admitting: Nurse Practitioner

## 2021-10-12 ENCOUNTER — Ambulatory Visit (INDEPENDENT_AMBULATORY_CARE_PROVIDER_SITE_OTHER): Payer: Self-pay | Admitting: Nurse Practitioner

## 2021-10-12 VITALS — BP 133/91 | HR 119 | Temp 98.4°F | Ht 71.0 in | Wt 191.2 lb

## 2021-10-12 DIAGNOSIS — E109 Type 1 diabetes mellitus without complications: Secondary | ICD-10-CM

## 2021-10-12 DIAGNOSIS — Z13220 Encounter for screening for lipoid disorders: Secondary | ICD-10-CM

## 2021-10-12 DIAGNOSIS — Z1159 Encounter for screening for other viral diseases: Secondary | ICD-10-CM

## 2021-10-12 DIAGNOSIS — R7309 Other abnormal glucose: Secondary | ICD-10-CM

## 2021-10-12 LAB — POCT GLYCOSYLATED HEMOGLOBIN (HGB A1C)
HbA1c POC (<> result, manual entry): 6.3 % (ref 4.0–5.6)
HbA1c, POC (controlled diabetic range): 6.3 % (ref 0.0–7.0)
HbA1c, POC (prediabetic range): 6.3 % (ref 5.7–6.4)
Hemoglobin A1C: 6.3 % — AB (ref 4.0–5.6)

## 2021-10-12 MED ORDER — DEXCOM G6 RECEIVER DEVI
1.0000 "application " | 1 refills | Status: DC
  Filled 2021-10-12: qty 1, fill #0

## 2021-10-12 MED ORDER — DEXCOM G6 SENSOR MISC
1.0000 "application " | 5 refills | Status: DC
  Filled 2021-10-12: qty 3, fill #0

## 2021-10-12 NOTE — Patient Instructions (Signed)

## 2021-10-12 NOTE — Progress Notes (Signed)
[facility] [place], [place]  [address] Phone:  [phone]   Fax:  [phone]   Established Patient Office Visit  Subjective:  Patient ID: Zachary Vazquez, male    DOB: 1985-12-07  Age: 35 y.o. MRN: [id number]  CC:  Chief Complaint  Patient presents with   Follow-up    Pt is here for his follow up visit. Pt states that when he checked his sugar levels this morning it was 68.    HPI Zachary Vazquez presents for follow up. He  has a past medical history of History of delayed wound healing (10/2019), Rash and nonspecific skin eruption (10/2019), Type 1 diabetes mellitus ([place]), Vitamin D deficiency (09/2020), and Wound of lower extremity, left, sequela (10/2019).   He report that he he was seen in ED. He was diagnosed with gastroparesis and sent home.  He is trying for financial assistance on hold until Jan. His CBG this am was 68 this morning. He has not been having a lot of lows. He  "flurts with lows" . He has been asymptomatic with CBG 's in the 20-30's in the past. He is trying to maintain his current A1C due to the long struggle to get it in a controlled range. He tries to due it with diet and adjusting the dose of insulin. He admits that he struggles with this at times. This is due to his dietary choices. He "loves gravy biscuits". He does this in moderation by eating only one biscuit at a time. He report a recent deviation from his normal routine ;celebrated at a Christmas party which may explain his elevated BP.   Past Medical History:  Diagnosis Date   History of delayed wound healing 10/2019   Rash and nonspecific skin eruption 10/2019   Type 1 diabetes mellitus ([place])    Vitamin D deficiency 09/2020   Wound of lower extremity, left, sequela 10/2019    Past Surgical History:  Procedure Laterality Date   TONSILLECTOMY      Family History  Problem Relation Age of Onset   Hypertension Mother    Diabetes Mellitus I Brother      Social History   Socioeconomic History   Marital status: Single    Spouse name: Not on file   Number of children: Not on file   Years of education: Not on file   Highest education level: Not on file  Occupational History   Not on file  Tobacco Use   Smoking status: Every Day    Packs/day: 1.00    Years: 15.00    Pack years: 15.00    Types: Cigarettes   Smokeless tobacco: Never  Vaping Use   Vaping Use: Never used  Substance and Sexual Activity   Alcohol use: Not Currently    Comment: occ   Drug use: Not Currently    Types: Marijuana    Comment: occ   Sexual activity: Yes    Birth control/protection: None  Other Topics Concern   Not on file  Social History Narrative   Not on file   Social Determinants of Health   Financial Resource Strain: Not on file  Food Insecurity: Not on file  Transportation Needs: Not on file  Physical Activity: Not on file  Stress: Not on file  Social Connections: Not on file  Intimate Partner Violence: Not on file    Outpatient Medications Prior to Visit  Medication Sig Dispense Refill   insulin aspart (NOVOLOG) 100  UNIT/ML injection Use as directed per sliding scale (Patient taking differently: Inject 0-10 Units into the skin See admin instructions. Per sliding scale 3 times daily with meals) 230 mL 0   insulin glargine (LANTUS) 100 UNIT/ML injection Inject 0.35 mLs (35 Units total) into the skin at bedtime. 10 mL 12   acetaminophen (TYLENOL) 500 MG tablet Take 1,000 mg by mouth every 6 (six) hours as needed for moderate pain or headache. (Patient not taking: Reported on 10/12/2021)     desonide (DESOWEN) 0.05 % lotion APPLY 1 SMALL AMOUNT TO AFFECTED AREA TWICE A DAY. (Patient not taking: Reported on 08/27/2021) 59 mL 2   desonide (DESOWEN) 0.05 % ointment APPLY 1 A SMALL AMOUNT TO AFFECTED AREA ONCE A DAY (Patient not taking: Reported on 08/27/2021) 60 g 1   pantoprazole (PROTONIX) 40 MG tablet TAKE 1 TABLET BY MOUTH DAILY. (Patient  not taking: Reported on 08/27/2021) 14 tablet 0   Vitamin D, Ergocalciferol, (DRISDOL) 1.25 MG (50000 UNIT) CAPS capsule TAKE 1 CAPSULE (50,000 UNITS TOTAL) BY MOUTH EVERY 7 (SEVEN) DAYS. (Patient not taking: Reported on 08/27/2021) 5 capsule 3   No facility-administered medications prior to visit.    Allergies  Allergen Reactions   Clindamycin/Lincomycin Anaphylaxis   Bactrim [Sulfamethoxazole-Trimethoprim] Swelling and Other (See Comments)    Eyes swell and bumps appear on/near the eyes     ROS Review of Systems    Objective:    Physical Exam Constitutional:      Appearance: He is normal weight.  HENT:     Head: Normocephalic and atraumatic.     Nose: Nose normal.  Cardiovascular:     Rate and Rhythm: Normal rate and regular rhythm.     Pulses: Normal pulses.     Heart sounds: Normal heart sounds.  Pulmonary:     Effort: Pulmonary effort is normal.     Breath sounds: Normal breath sounds.  Abdominal:     Palpations: Abdomen is soft.  Musculoskeletal:        General: Normal range of motion.     Cervical back: Normal range of motion.     Right lower leg: No edema.     Left lower leg: No edema.  Feet:     Right foot:     Protective Sensation: 10 sites tested.  10 sites sensed.     Left foot:     Protective Sensation: 10 sites tested.  10 sites sensed.  Skin:    General: Skin is warm.     Capillary Refill: Capillary refill takes less than 2 seconds.  Neurological:     General: No focal deficit present.     Mental Status: He is alert.  Psychiatric:        Mood and Affect: Mood normal.        Behavior: Behavior normal.        Thought Content: Thought content normal.        Judgment: Judgment normal.    BP (!) 133/91 (BP Location: Right Arm, Cuff Size: Large)    Pulse (!) 119    Temp 98.4 F (36.9 C)    Ht 5' 11"  (1.803 m)    Wt 191 lb 3.2 oz (86.7 kg)    SpO2 100%    BMI 26.67 kg/m  Wt Readings from Last 3 Encounters:  10/12/21 191 lb 3.2 oz (86.7 kg)   08/26/21 182 lb 15.7 oz (83 kg)  07/13/21 182 lb 9.6 oz (82.8 kg)     Health  Maintenance Due  Topic Date Due   Hepatitis C Screening  Never done    There are no preventive care reminders to display for this patient.  Lab Results  Component Value Date   TSH 1.821 08/26/2021   Lab Results  Component Value Date   WBC 13.5 (H) 08/27/2021   HGB 13.0 08/27/2021   HCT 38.2 (L) 08/27/2021   MCV 93.4 08/27/2021   PLT 249 08/27/2021   Lab Results  Component Value Date   NA 137 08/26/2021   K 3.8 08/26/2021   CO2 24 08/26/2021   GLUCOSE 83 08/26/2021   BUN 11 08/26/2021   CREATININE 1.06 08/26/2021   BILITOT 1.1 08/26/2021   ALKPHOS 48 08/26/2021   AST 25 08/26/2021   ALT 27 08/26/2021   PROT 6.7 08/26/2021   ALBUMIN 4.0 08/26/2021   CALCIUM 9.2 08/26/2021   ANIONGAP 10 08/26/2021   EGFR 87 07/13/2021   Lab Results  Component Value Date   CHOL 195 10/11/2020   Lab Results  Component Value Date   HDL 42 10/11/2020   Lab Results  Component Value Date   LDLCALC 139 (H) 10/11/2020   Lab Results  Component Value Date   TRIG 74 10/11/2020   Lab Results  Component Value Date   CHOLHDL 4.6 10/11/2020   Lab Results  Component Value Date   HGBA1C 6.3 (A) 10/12/2021   HGBA1C 6.3 10/12/2021   HGBA1C 6.3 10/12/2021   HGBA1C 6.3 10/12/2021      Assessment & Plan:   Problem List Items Addressed This Visit       Endocrine   Type 1 diabetes mellitus ([place]) - Primary Controlled Long discussion again with the dangers of hypoglycemia Encourage compliance with current treatment regimen   Encourage regular CBG monitoring Encourage contacting office if excessive hyperglycemia and or hypoglycemia Lifestyle modification with ADA diet (fewer calories, more high fiber foods, whole grains and non-starchy vegetables, lower fat meat and fish, low-fat diary include healthy oils) regular exercise (physical activity) a Opthalmology exam discussed  Nutritional consult  recommended Home BP monitoring also encouraged goal <130/80    Relevant Orders   HgB A1c (Completed)   Comp. Metabolic Panel (12)     Other   Hemoglobin A1c less than 7.0%   Other Visit Diagnoses     Screening for cholesterol level       Relevant Orders   Lipid panel   Encounter for hepatitis C screening test for low risk patient       Relevant Orders   Hepatitis C antibody       Meds ordered this encounter  Medications   Continuous Blood Gluc Receiver (DEXCOM G6 MMNOTRRN) DEVI    Sig: Apply once a week    Dispense:  1 each    Refill:  1    Order Specific Question:   Supervising Provider    Answer:   Tresa Garter [[id number]]   Continuous Blood Gluc Sensor (DEXCOM G6 SENSOR) MISC    Sig: Apply once a week    Dispense:  3 each    Refill:  5    Order Specific Question:   Supervising Provider    AnswerTresa Garter [id number]    Follow-up: Return in about 3 months (around 01/10/2022) for follow up [id number].    Vevelyn Francois, NP

## 2021-10-13 ENCOUNTER — Encounter: Payer: Self-pay | Admitting: Nurse Practitioner

## 2021-10-13 LAB — COMP. METABOLIC PANEL (12)
AST: 25 IU/L (ref 0–40)
Albumin/Globulin Ratio: 1.9 (ref 1.2–2.2)
Albumin: 4.9 g/dL (ref 4.0–5.0)
Alkaline Phosphatase: 91 IU/L (ref 44–121)
BUN/Creatinine Ratio: 15 (ref 9–20)
BUN: 17 mg/dL (ref 6–20)
Bilirubin Total: 0.6 mg/dL (ref 0.0–1.2)
Calcium: 9.9 mg/dL (ref 8.7–10.2)
Chloride: 99 mmol/L (ref 96–106)
Creatinine, Ser: 1.13 mg/dL (ref 0.76–1.27)
Globulin, Total: 2.6 g/dL (ref 1.5–4.5)
Glucose: 141 mg/dL — ABNORMAL HIGH (ref 70–99)
Potassium: 5 mmol/L (ref 3.5–5.2)
Sodium: 139 mmol/L (ref 134–144)
Total Protein: 7.5 g/dL (ref 6.0–8.5)
eGFR: 87 mL/min/{1.73_m2} (ref >59)

## 2021-10-13 LAB — LIPID PANEL
Chol/HDL Ratio: 3.3 ratio (ref 0.0–5.0)
Cholesterol, Total: 190 mg/dL (ref 100–199)
HDL: 58 mg/dL (ref >39)
LDL Chol Calc (NIH): 121 mg/dL — ABNORMAL HIGH (ref 0–99)
Triglycerides: 57 mg/dL (ref 0–149)
VLDL Cholesterol Cal: 11 mg/dL (ref 5–40)

## 2021-10-15 ENCOUNTER — Telehealth: Payer: Self-pay | Admitting: Clinical

## 2021-10-15 NOTE — Telephone Encounter (Signed)
[facility] General Follow Up Note  10/15/2021 Name: Zachary Vazquez MRN: [id number] DOB: [birth year] Zachary Vazquez is a 35 y.o. year old male who sees Barbette Merino, NP for primary care. LCSW was consulted to assess patient's needs and assist the patient with [employer] .  Interpreter: No.   Interpreter Name & Language: none  Assessment: Patient experiencing financial constraints related to lack of health coverage.   Intervention: CSW called patient to follow up on [employer] and [employer]. Patient reported had requested his tax non-filing letter but still has not received it. He plans to file taxes for this year and will plan to apply for the [facility] Card once he has that tax return document. Advised patient to call CSW for assistance with follow up.   Review of patient status, including review of consultants reports, relevant laboratory and other test results, and collaboration with appropriate care team members and the patient's provider was performed as part of comprehensive patient evaluation and provision of services.    Abigail Butts, LCSW [facility] [phone]

## 2021-11-14 ENCOUNTER — Other Ambulatory Visit: Payer: Self-pay

## 2021-11-14 ENCOUNTER — Inpatient Hospital Stay (HOSPITAL_COMMUNITY)
Admission: EM | Admit: 2021-11-14 | Discharge: 2021-11-17 | DRG: 637 | Disposition: A | Discharge status: Home / Self Care | Payer: Self-pay | Attending: Internal Medicine | Admitting: Internal Medicine

## 2021-11-14 ENCOUNTER — Encounter (HOSPITAL_COMMUNITY): Payer: Self-pay

## 2021-11-14 ENCOUNTER — Emergency Department (HOSPITAL_COMMUNITY): Payer: Self-pay

## 2021-11-14 DIAGNOSIS — N179 Acute kidney failure, unspecified: Secondary | ICD-10-CM | POA: Diagnosis present

## 2021-11-14 DIAGNOSIS — Z794 Long term (current) use of insulin: Secondary | ICD-10-CM

## 2021-11-14 DIAGNOSIS — E10649 Type 1 diabetes mellitus with hypoglycemia without coma: Principal | ICD-10-CM | POA: Diagnosis present

## 2021-11-14 DIAGNOSIS — E1065 Type 1 diabetes mellitus with hyperglycemia: Secondary | ICD-10-CM | POA: Diagnosis not present

## 2021-11-14 DIAGNOSIS — E1043 Type 1 diabetes mellitus with diabetic autonomic (poly)neuropathy: Secondary | ICD-10-CM | POA: Diagnosis present

## 2021-11-14 DIAGNOSIS — R112 Nausea with vomiting, unspecified: Secondary | ICD-10-CM

## 2021-11-14 DIAGNOSIS — I10 Essential (primary) hypertension: Secondary | ICD-10-CM | POA: Diagnosis present

## 2021-11-14 DIAGNOSIS — E860 Dehydration: Secondary | ICD-10-CM | POA: Diagnosis present

## 2021-11-14 DIAGNOSIS — Z882 Allergy status to sulfonamides status: Secondary | ICD-10-CM

## 2021-11-14 DIAGNOSIS — F17210 Nicotine dependence, cigarettes, uncomplicated: Secondary | ICD-10-CM | POA: Diagnosis present

## 2021-11-14 DIAGNOSIS — E162 Hypoglycemia, unspecified: Secondary | ICD-10-CM | POA: Diagnosis present

## 2021-11-14 DIAGNOSIS — Z881 Allergy status to other antibiotic agents status: Secondary | ICD-10-CM

## 2021-11-14 DIAGNOSIS — G9341 Metabolic encephalopathy: Secondary | ICD-10-CM | POA: Diagnosis present

## 2021-11-14 DIAGNOSIS — K3184 Gastroparesis: Secondary | ICD-10-CM | POA: Diagnosis present

## 2021-11-14 DIAGNOSIS — D72829 Elevated white blood cell count, unspecified: Secondary | ICD-10-CM | POA: Diagnosis present

## 2021-11-14 DIAGNOSIS — E109 Type 1 diabetes mellitus without complications: Secondary | ICD-10-CM

## 2021-11-14 DIAGNOSIS — Z20822 Contact with and (suspected) exposure to covid-19: Secondary | ICD-10-CM | POA: Diagnosis present

## 2021-11-14 LAB — CBC WITH DIFFERENTIAL/PLATELET
Abs Immature Granulocytes: 0.28 10*3/uL — ABNORMAL HIGH (ref 0.00–0.07)
Basophils Absolute: 0.1 10*3/uL (ref 0.0–0.1)
Basophils Relative: 1 %
Eosinophils Absolute: 0.2 10*3/uL (ref 0.0–0.5)
Eosinophils Relative: 1 %
HCT: 46.4 % (ref 39.0–52.0)
Hemoglobin: 15.3 g/dL (ref 13.0–17.0)
Immature Granulocytes: 2 %
Lymphocytes Relative: 14 %
Lymphs Abs: 2.1 10*3/uL (ref 0.7–4.0)
MCH: 30.8 pg (ref 26.0–34.0)
MCHC: 33 g/dL (ref 30.0–36.0)
MCV: 93.4 fL (ref 80.0–100.0)
Monocytes Absolute: 1 10*3/uL (ref 0.1–1.0)
Monocytes Relative: 7 %
Neutro Abs: 11.3 10*3/uL — ABNORMAL HIGH (ref 1.7–7.7)
Neutrophils Relative %: 75 %
Platelets: 278 10*3/uL (ref 150–400)
RBC: 4.97 MIL/uL (ref 4.22–5.81)
RDW: 12 % (ref 11.5–15.5)
WBC: 14.9 10*3/uL — ABNORMAL HIGH (ref 4.0–10.5)
nRBC: 0 % (ref 0.0–0.2)

## 2021-11-14 LAB — BASIC METABOLIC PANEL
Anion gap: 8 (ref 5–15)
BUN: 19 mg/dL (ref 6–20)
CO2: 24 mmol/L (ref 22–32)
Calcium: 9.1 mg/dL (ref 8.9–10.3)
Chloride: 103 mmol/L (ref 98–111)
Creatinine, Ser: 1.3 mg/dL — ABNORMAL HIGH (ref 0.61–1.24)
GFR, Estimated: >60 mL/min (ref >60)
Glucose, Bld: 50 mg/dL — ABNORMAL LOW (ref 70–99)
Potassium: 4 mmol/L (ref 3.5–5.1)
Sodium: 135 mmol/L (ref 135–145)

## 2021-11-14 LAB — TROPONIN I (HIGH SENSITIVITY): Troponin I (High Sensitivity): 5 ng/L (ref <18)

## 2021-11-14 LAB — CBG MONITORING, ED
Glucose-Capillary: 87 mg/dL (ref 70–99)
Glucose-Capillary: 29 mg/dL — CL (ref 70–99)

## 2021-11-14 LAB — ETHANOL: Alcohol, Ethyl (B): <10 mg/dL (ref <10)

## 2021-11-14 LAB — LIPASE, BLOOD: Lipase: 21 U/L (ref 11–51)

## 2021-11-14 MED ORDER — SODIUM CHLORIDE 0.9 % IV SOLN
250.0000 mL | INTRAVENOUS | Status: DC | PRN
Start: 2021-11-14 — End: 2021-11-17

## 2021-11-14 MED ORDER — SODIUM CHLORIDE 0.9% FLUSH
3.0000 mL | Freq: Two times a day (BID) | INTRAVENOUS | Status: DC
  Administered 2021-11-14 – 2021-11-17 (×3): 3 mL via INTRAVENOUS

## 2021-11-14 MED ORDER — DEXTROSE-NACL 10-0.45 % IV SOLN
INTRAVENOUS | Status: DC
  Filled 2021-11-14 (×2): qty 1000

## 2021-11-14 MED ORDER — ONDANSETRON HCL 4 MG/2ML IJ SOLN
4.0000 mg | Freq: Once | INTRAMUSCULAR | Status: AC
  Administered 2021-11-14: 4 mg via INTRAVENOUS
  Filled 2021-11-14: qty 2

## 2021-11-14 MED ORDER — SODIUM CHLORIDE 0.9% FLUSH: 3.0000 mL | INTRAVENOUS | Status: DC | PRN

## 2021-11-14 MED ORDER — DEXTROSE 50 % IV SOLN
1.0000 | Freq: Once | INTRAVENOUS | Status: AC
  Administered 2021-11-14: 50 mL via INTRAVENOUS
  Filled 2021-11-14: qty 50

## 2021-11-14 MED ORDER — METOCLOPRAMIDE HCL 5 MG/ML IJ SOLN
10.0000 mg | Freq: Once | INTRAMUSCULAR | Status: AC
Start: 2021-11-14 — End: 2021-11-14
  Administered 2021-11-14: 10 mg via INTRAVENOUS
  Filled 2021-11-14: qty 2

## 2021-11-14 NOTE — ED Notes (Signed)
Patient transported to X-ray 

## 2021-11-14 NOTE — ED Provider Notes (Signed)
[facility] EMERGENCY DEPARTMENT Provider Note   CSN: [id number] Arrival date & time: 11/14/21  2211     History  Chief Complaint  Patient presents with   Hypoglycemia    Zachary Vazquez is a 36 y.o. male presenting from home with an episode of hypoglycemia and syncope.  Supplemental history provided by paramedics.  They report that they were called onto the scene by the patient's mother, who found him on his floor around 9:00 this evening unresponsive.  They report that by the time they arrived on scene, the patient was awake and partially responsive.  His initial blood sugar was 34, he was given supplemental glucose and his sugar improved to 64 and then to 100.  His blood pressure has been slightly hypertensive in route to the hospital.  There is no report by family, nor by patient for alcohol use or other drug use.  The patient tells me that she does take insulin, both short and long-acting insulin, but he does not recall have any units he takes, does not know when he last took it.  He says he felt fine this morning.  He denies any viral illness this week.  He feels nauseated and lightheaded  HPI     Home Medications Prior to Admission medications   Medication Sig Start Date End Date Taking? Authorizing Provider  Blood Glucose Monitoring Suppl KIT by Does not apply route 3 (three) times daily.   Yes [provider]  insulin aspart (NOVOLOG) 100 UNIT/ML injection Use as directed per sliding scale Patient taking differently: Inject 0-10 Units into the skin See admin instructions. Per sliding scale 3 times daily with meals 06/11/21  Yes King, Diona Foley, NP  insulin glargine (LANTUS) 100 UNIT/ML injection Inject 0.35 mLs (35 Units total) into the skin at bedtime. Patient taking differently: Inject 30 Units into the skin at bedtime. 07/13/21 07/13/22 Yes Vevelyn Francois, NP  Continuous Blood Gluc Receiver (DEXCOM G6 RECEIVER) DEVI Apply once a week Patient  not taking: Reported on 11/15/2021 10/12/21   Vevelyn Francois, NP  Continuous Blood Gluc Sensor (DEXCOM G6 SENSOR) MISC Apply once a week Patient not taking: Reported on 11/15/2021 10/12/21 10/12/22  Vevelyn Francois, NP  desonide (DESOWEN) 0.05 % lotion APPLY 1 SMALL AMOUNT TO AFFECTED AREA TWICE A DAY. Patient not taking: Reported on 08/27/2021 11/22/20 11/22/21  Macario Carls, MD  desonide (DESOWEN) 0.05 % ointment APPLY 1 A SMALL AMOUNT TO AFFECTED AREA ONCE A DAY Patient not taking: Reported on 08/27/2021 11/22/20 11/22/21  Macario Carls, MD  pantoprazole (PROTONIX) 40 MG tablet TAKE 1 TABLET BY MOUTH DAILY. Patient not taking: Reported on 08/27/2021 04/16/21 05/07/21        Allergies    Clindamycin/lincomycin and Bactrim [sulfamethoxazole-trimethoprim]    Review of Systems   Review of Systems  Physical Exam Updated Vital Signs BP 120/74 (BP Location: Left Arm)    Pulse (!) 110    Temp 98 F (36.7 C)    Resp 19    Ht 5' 11"  (1.803 m)    Wt 85 kg    SpO2 96%    BMI 26.14 kg/m  Physical Exam Constitutional:      General: He is not in acute distress.    Comments: Appears sluggish, dry heaving  HENT:     Head: Normocephalic and atraumatic.  Eyes:     Conjunctiva/sclera: Conjunctivae normal.     Pupils: Pupils are equal, round, and reactive to  light.  Cardiovascular:     Rate and Rhythm: Regular rhythm. Tachycardia present.  Pulmonary:     Effort: Pulmonary effort is normal. No respiratory distress.  Abdominal:     General: There is no distension.     Tenderness: There is no abdominal tenderness.  Skin:    General: Skin is warm and dry.  Neurological:     General: No focal deficit present.     Mental Status: He is alert. Mental status is at baseline.  Psychiatric:        Mood and Affect: Mood normal.        Behavior: Behavior normal.    ED Results / Procedures / Treatments   Labs (all labs ordered are listed, but only abnormal results are displayed) Labs Reviewed  BASIC  METABOLIC PANEL - Abnormal; Notable for the following components:      Result Value   Glucose, Bld 50 (*)    Creatinine, Ser 1.30 (*)    All other components within normal limits  CBC WITH DIFFERENTIAL/PLATELET - Abnormal; Notable for the following components:   WBC 14.9 (*)    Neutro Abs 11.3 (*)    Abs Immature Granulocytes 0.28 (*)    All other components within normal limits  RAPID URINE DRUG SCREEN, HOSP PERFORMED - Abnormal; Notable for the following components:   Tetrahydrocannabinol POSITIVE (*)    All other components within normal limits  URINALYSIS, ROUTINE W REFLEX MICROSCOPIC - Abnormal; Notable for the following components:   Specific Gravity, Urine >1.030 (*)    Glucose, UA 100 (*)    Hgb urine dipstick SMALL (*)    Ketones, ur 15 (*)    Protein, ur 30 (*)    All other components within normal limits  CBC - Abnormal; Notable for the following components:   WBC 18.6 (*)    All other components within normal limits  BASIC METABOLIC PANEL - Abnormal; Notable for the following components:   CO2 21 (*)    Glucose, Bld 244 (*)    Creatinine, Ser 1.30 (*)    All other components within normal limits  BETA-HYDROXYBUTYRIC ACID - Abnormal; Notable for the following components:   Beta-Hydroxybutyric Acid 4.08 (*)    All other components within normal limits  HEMOGLOBIN A1C - Abnormal; Notable for the following components:   Hgb A1c MFr Bld 6.7 (*)    All other components within normal limits  GLUCOSE, CAPILLARY - Abnormal; Notable for the following components:   Glucose-Capillary 333 (*)    All other components within normal limits  CBG MONITORING, ED - Abnormal; Notable for the following components:   Glucose-Capillary 29 (*)    All other components within normal limits  CBG MONITORING, ED - Abnormal; Notable for the following components:   Glucose-Capillary 116 (*)    All other components within normal limits  CBG MONITORING, ED - Abnormal; Notable for the following  components:   Glucose-Capillary 175 (*)    All other components within normal limits  CBG MONITORING, ED - Abnormal; Notable for the following components:   Glucose-Capillary 219 (*)    All other components within normal limits  CBG MONITORING, ED - Abnormal; Notable for the following components:   Glucose-Capillary 295 (*)    All other components within normal limits  CBG MONITORING, ED - Abnormal; Notable for the following components:   Glucose-Capillary 301 (*)    All other components within normal limits  CBG MONITORING, ED - Abnormal; Notable for the following  components:   Glucose-Capillary 432 (*)    All other components within normal limits  CBG MONITORING, ED - Abnormal; Notable for the following components:   Glucose-Capillary 291 (*)    All other components within normal limits  CBG MONITORING, ED - Abnormal; Notable for the following components:   Glucose-Capillary 302 (*)    All other components within normal limits  RESP PANEL BY RT-PCR (FLU A&B, COVID) ARPGX2  LIPASE, BLOOD  ETHANOL  URINALYSIS, MICROSCOPIC (REFLEX)  TSH  CBG MONITORING, ED  CBG MONITORING, ED  CBG MONITORING, ED  CBG MONITORING, ED  CBG MONITORING, ED  CBG MONITORING, ED  CBG MONITORING, ED  CBG MONITORING, ED  CBG MONITORING, ED  CBG MONITORING, ED  CBG MONITORING, ED  CBG MONITORING, ED  CBG MONITORING, ED  CBG MONITORING, ED  CBG MONITORING, ED  CBG MONITORING, ED  CBG MONITORING, ED  CBG MONITORING, ED  CBG MONITORING, ED  CBG MONITORING, ED  TROPONIN I (HIGH SENSITIVITY)  TROPONIN I (HIGH SENSITIVITY)    EKG EKG Interpretation  Date/Time:  Wednesday November 14 2021 22:54:51 EST Ventricular Rate:  114 PR Interval:  152 QRS Duration: 105 QT Interval:  334 QTC Calculation: 460 R Axis:   -21 Text Interpretation: Sinus tachycardia Borderline left axis deviation Probable anteroseptal infarct, old Confirmed by Octaviano Glow [phone]) on 11/14/2021 11:11:04 PM  Radiology CT  ABDOMEN PELVIS WO CONTRAST  Result Date: 11/15/2021 CLINICAL DATA:  Abdominal pain, acute, nonlocalized.  Hypoglycemia. EXAM: CT ABDOMEN AND PELVIS WITHOUT CONTRAST TECHNIQUE: Multidetector CT imaging of the abdomen and pelvis was performed following the standard protocol without IV contrast. RADIATION DOSE REDUCTION: This exam was performed according to the departmental dose-optimization program which includes automated exposure control, adjustment of the mA and/or kV according to patient size and/or use of iterative reconstruction technique. COMPARISON:  05/11/2020 FINDINGS: Lower chest: Normal Hepatobiliary: Redemonstration of coarse calcifications at the right dome of the liver, favored to represent calcifications within a hemangioma. These are not changed and would not be clinically significant. Pancreas: No significant finding. Spleen: Normal Adrenals/Urinary Tract: Adrenal glands are normal. Kidneys are normal. No cyst, mass, stone or hydronephrosis. Stomach/Bowel: No abnormal bowel finding. No evidence of obstruction, ileus, inflammatory change or focal lesion. Vascular/Lymphatic: Aortic atherosclerosis, minimal. IVC is normal. No adenopathy. Reproductive: Normal Other: No free fluid or air. Musculoskeletal: Old minor superior endplate deformities at T10 and T11. No evidence of recent injury. These do appear new since July of 2021 however. IMPRESSION: No acute CT finding to explain abdominal pain. Chronic calcifications at the right dome of the liver favored to represent calcifications within a hemangioma. Aortic atherosclerosis, mild. Minor superior endplate fractures at D92 and T11, new since July of 2021 but likely chronic and healed. Electronically Signed   By: Nelson Chimes M.D.   On: 11/15/2021 11:50   DG Chest 2 View  Result Date: 11/14/2021 CLINICAL DATA:  Syncope, aspiration EXAM: CHEST - 2 VIEW COMPARISON:  08/26/2021 FINDINGS: Frontal and lateral views of the chest demonstrate an unremarkable  cardiac silhouette. No acute airspace disease, effusion, or pneumothorax. No acute bony abnormalities. IMPRESSION: 1. No acute intrathoracic process. Electronically Signed   By: Randa Ngo M.D.   On: 11/14/2021 22:46    Procedures .Critical Care Performed by: Wyvonnia Dusky, MD Authorized by: Wyvonnia Dusky, MD   Critical care provider statement:    Critical care time (minutes):  45   Critical care time was exclusive of:  Separately billable procedures  and treating other patients   Critical care was necessary to treat or prevent imminent or life-threatening deterioration of the following conditions:  Endocrine crisis   Critical care was time spent personally by me on the following activities:  Ordering and performing treatments and interventions, ordering and review of laboratory studies, ordering and review of radiographic studies, pulse oximetry, review of old charts, examination of patient and evaluation of patient's response to treatment Comments:     Hypoglycemia management, d10 infusion, frequent POC glucose checks    Medications Ordered in ED Medications  sodium chloride flush (NS) 0.9 % injection 3 mL (3 mLs Intravenous Not Given 11/15/21 0902)  sodium chloride flush (NS) 0.9 % injection 3 mL (has no administration in time range)  0.9 %  sodium chloride infusion (has no administration in time range)  enoxaparin (LOVENOX) injection 40 mg (40 mg Subcutaneous Given 11/15/21 0823)  acetaminophen (TYLENOL) tablet 650 mg (has no administration in time range)    Or  acetaminophen (TYLENOL) suppository 650 mg (has no administration in time range)  ondansetron (ZOFRAN) tablet 4 mg ( Oral See Alternative 11/15/21 1222)    Or  ondansetron (ZOFRAN) injection 4 mg (4 mg Intravenous Given 11/15/21 1222)  prochlorperazine (COMPAZINE) injection 10 mg (10 mg Intravenous Given 11/15/21 0330)  insulin aspart (novoLOG) injection 0-9 Units (7 Units Subcutaneous Given 11/15/21 1235)  insulin  glargine-yfgn (SEMGLEE) injection 12 Units (has no administration in time range)  lactated ringers infusion ( Intravenous Restarted 11/15/21 1225)  pantoprazole (PROTONIX) EC tablet 40 mg (40 mg Oral Given 11/15/21 1226)  alum & mag hydroxide-simeth (MAALOX/MYLANTA) 200-200-20 MG/5ML suspension 30 mL (has no administration in time range)  metoCLOPramide (REGLAN) injection 5 mg (5 mg Intravenous Given 11/15/21 1223)  magnesium sulfate IVPB 1 g 100 mL (has no administration in time range)  LORazepam (ATIVAN) injection 1 mg (1 mg Intravenous Patient Refused/Not Given 11/15/21 1326)  metoprolol tartrate (LOPRESSOR) injection 5 mg (5 mg Intravenous Given 11/15/21 1226)  ondansetron (ZOFRAN) injection 4 mg (4 mg Intravenous Given 11/14/21 2231)  dextrose 50 % solution 50 mL (50 mLs Intravenous Given 11/14/21 2312)  metoCLOPramide (REGLAN) injection 10 mg (10 mg Intravenous Given 11/14/21 2348)  lactated ringers bolus 1,000 mL (1,000 mLs Intravenous New Bag/Given 11/15/21 1229)    ED Course/ Medical Decision Making/ A&P Clinical Course as of 11/15/21 1327  Wed Nov 14, 2021  2310 Patient continues to vomit, not able to tolerate p.o., , treated with an amp of D50 now, and a d10 infusion ordered.  I attempted to reach his mother Vaughan Basta but no answer by phone. [MT]  2346 Pt signed out to Dr Dolly Rias EDP pending f/u on labs, glucose monitoring, medical admission for persistent hypoglycemia. [MT]    Clinical Course User Index [MT] Braydee Shimkus, Carola Rhine, MD                           Medical Decision Making Amount and/or Complexity of Data Reviewed Labs: ordered. Radiology: ordered. ECG/medicine tests: ordered.  Risk Prescription drug management. Decision regarding hospitalization.   This patient presents to the ED with concern for syncope versus near syncope.  This involves an extensive number of treatment options, and is a complaint that carries with it a high risk of complications and morbidity.  The  differential diagnosis includes symptomatic hypoglycemia versus anemia versus arrhythmia versus other drug intoxication versus other  Additional history obtained from paramedics upon the patient's arrival  External  records from outside source obtained and reviewed including hospital record from October 2022, the patient was admitted for intractable nausea and vomiting and left AMA; at that time he reportedly took too much insulin, 35 units instead of his usual 25.  He also had a low blood sugar on scene.  I ordered and personally interpreted labs.  The pertinent results include:  UA with ketones, protein, high specific graviy, no evidence of infection; covid/flu negative; WBC 14.9 (may be reactive from vomiting); BMP pending at the time of signout; UDS pending; etoh negative.  I ordered imaging studies including x-ray of the chest I independently visualized and interpreted imaging which showed no focal infiltrate or aspiration I agree with the radiologist interpretation  The patient was maintained on a cardiac monitor.   Per my interpretation the patient's ECG shows sinus tachycardia  I ordered medication including fluid bolus, dextrose infusion for dehydration, hypoglycemia I have reviewed the patients home medicines and have made adjustments as needed  Test Considered:  - No focal abdominal ttp to suggest intraabdominal infection/inflammation - I did not feel emergent CT abdomen was necessary, but if clinical presentation changes, will defer this imaging to inpatient team  I suspect his leukocytosis and tachycardia are reactive to vomiting and stress-induced, and do not see clear nidus of bacterial infection to suspect sepsis at this time.  Therefore antibiotics were not initiated in the ED.   After the interventions noted above, I reevaluated the patient and found that they have: stayed the same   Dispostion:  After consideration of the diagnostic results and the patients response to  treatment, I feel that the patent would benefit from inpatient hospitalization for blood sugar control, hydration, further workup.         Final Clinical Impression(s) / ED Diagnoses Final diagnoses:  Hypoglycemia  Nausea and vomiting, unspecified vomiting type    Rx / DC Orders ED Discharge Orders     None         Wyvonnia Dusky, MD 11/15/21 1328

## 2021-11-14 NOTE — ED Triage Notes (Signed)
Pt presents to the ED from home via [facility], family called 911 after finding pt in the floor unresponsive and decreased respirations. Glucose 32. EMS administered D10 IV and glucose improved. No insulin pump in place, takes lantus, family unsure about Novolog. Pt nauseated without vomiting at this time. Pt alert and answering questions.

## 2021-11-14 NOTE — ED Provider Notes (Signed)
11:29 PM Assumed care from Dr. Renaye Rakers, please see their note for full history, physical and decision making until this point. In brief this is a 36 y.o. year old male who presented to the ED tonight with Hypoglycemia     Persistent hypoglycemia. Not eating right now because of emesis. Getting amp d50 and D10 infusion. Still sleepy/altered and vomiting. Not giving a good history. Pending labs for admission.  BMP not too concerning aside from hypoglycemia and mild AKI. Went to examine patient and still vomiting. Still tachy. Will need admission.    Labs, studies and imaging reviewed by myself and considered in medical decision making if ordered. Imaging interpreted by radiology.  Labs Reviewed  CBC WITH DIFFERENTIAL/PLATELET - Abnormal; Notable for the following components:      Result Value   WBC 14.9 (*)    Neutro Abs 11.3 (*)    Abs Immature Granulocytes 0.28 (*)    All other components within normal limits  CBG MONITORING, ED - Abnormal; Notable for the following components:   Glucose-Capillary 29 (*)    All other components within normal limits  ETHANOL  BASIC METABOLIC PANEL  LIPASE, BLOOD  RAPID URINE DRUG SCREEN, HOSP PERFORMED  CBG MONITORING, ED  CBG MONITORING, ED  TROPONIN I (HIGH SENSITIVITY)    DG Chest 2 View  Final Result      No follow-ups on file.    Carmela Piechowski, Barbara Cower, MD 11/15/21 [phone]

## 2021-11-15 ENCOUNTER — Inpatient Hospital Stay (HOSPITAL_COMMUNITY): Payer: Self-pay

## 2021-11-15 DIAGNOSIS — R112 Nausea with vomiting, unspecified: Secondary | ICD-10-CM

## 2021-11-15 DIAGNOSIS — G9341 Metabolic encephalopathy: Secondary | ICD-10-CM

## 2021-11-15 DIAGNOSIS — E162 Hypoglycemia, unspecified: Secondary | ICD-10-CM

## 2021-11-15 LAB — CBC
HCT: 45.6 % (ref 39.0–52.0)
Hemoglobin: 15.6 g/dL (ref 13.0–17.0)
MCH: 31.6 pg (ref 26.0–34.0)
MCHC: 34.2 g/dL (ref 30.0–36.0)
MCV: 92.3 fL (ref 80.0–100.0)
Platelets: 238 10*3/uL (ref 150–400)
RBC: 4.94 MIL/uL (ref 4.22–5.81)
RDW: 12.1 % (ref 11.5–15.5)
WBC: 18.6 10*3/uL — ABNORMAL HIGH (ref 4.0–10.5)
nRBC: 0 % (ref 0.0–0.2)

## 2021-11-15 LAB — BASIC METABOLIC PANEL
Anion gap: 15 (ref 5–15)
BUN: 19 mg/dL (ref 6–20)
CO2: 21 mmol/L — ABNORMAL LOW (ref 22–32)
Calcium: 9.3 mg/dL (ref 8.9–10.3)
Chloride: 99 mmol/L (ref 98–111)
Creatinine, Ser: 1.3 mg/dL — ABNORMAL HIGH (ref 0.61–1.24)
GFR, Estimated: >60 mL/min (ref >60)
Glucose, Bld: 244 mg/dL — ABNORMAL HIGH (ref 70–99)
Potassium: 4 mmol/L (ref 3.5–5.1)
Sodium: 135 mmol/L (ref 135–145)

## 2021-11-15 LAB — RESP PANEL BY RT-PCR (FLU A&B, COVID) ARPGX2
Influenza A by PCR: NEGATIVE
Influenza B by PCR: NEGATIVE
SARS Coronavirus 2 by RT PCR: NEGATIVE

## 2021-11-15 LAB — RAPID URINE DRUG SCREEN, HOSP PERFORMED
Amphetamines: NOT DETECTED
Barbiturates: NOT DETECTED
Benzodiazepines: NOT DETECTED
Cocaine: NOT DETECTED
Opiates: NOT DETECTED
Tetrahydrocannabinol: POSITIVE — AB

## 2021-11-15 LAB — URINALYSIS, ROUTINE W REFLEX MICROSCOPIC
Bilirubin Urine: NEGATIVE
Glucose, UA: 100 mg/dL — AB
Ketones, ur: 15 mg/dL — AB
Leukocytes,Ua: NEGATIVE
Nitrite: NEGATIVE
Protein, ur: 30 mg/dL — AB
Specific Gravity, Urine: >1.03 — ABNORMAL HIGH (ref 1.005–1.030)
pH: 5.5 (ref 5.0–8.0)

## 2021-11-15 LAB — URINALYSIS, MICROSCOPIC (REFLEX)
Bacteria, UA: NONE SEEN
Squamous Epithelial / HPF: NONE SEEN (ref 0–5)

## 2021-11-15 LAB — GLUCOSE, CAPILLARY
Glucose-Capillary: 333 mg/dL — ABNORMAL HIGH (ref 70–99)
Glucose-Capillary: 284 mg/dL — ABNORMAL HIGH (ref 70–99)
Glucose-Capillary: 328 mg/dL — ABNORMAL HIGH (ref 70–99)

## 2021-11-15 LAB — CBG MONITORING, ED
Glucose-Capillary: 116 mg/dL — ABNORMAL HIGH (ref 70–99)
Glucose-Capillary: 175 mg/dL — ABNORMAL HIGH (ref 70–99)
Glucose-Capillary: 219 mg/dL — ABNORMAL HIGH (ref 70–99)
Glucose-Capillary: 291 mg/dL — ABNORMAL HIGH (ref 70–99)
Glucose-Capillary: 295 mg/dL — ABNORMAL HIGH (ref 70–99)
Glucose-Capillary: 301 mg/dL — ABNORMAL HIGH (ref 70–99)
Glucose-Capillary: 302 mg/dL — ABNORMAL HIGH (ref 70–99)
Glucose-Capillary: 432 mg/dL — ABNORMAL HIGH (ref 70–99)

## 2021-11-15 LAB — TSH: TSH: 5.481 u[IU]/mL — ABNORMAL HIGH (ref 0.350–4.500)

## 2021-11-15 LAB — HEMOGLOBIN A1C
Hgb A1c MFr Bld: 6.7 % — ABNORMAL HIGH (ref 4.8–5.6)
Mean Plasma Glucose: 145.59 mg/dL

## 2021-11-15 LAB — BETA-HYDROXYBUTYRIC ACID: Beta-Hydroxybutyric Acid: 4.08 mmol/L — ABNORMAL HIGH (ref 0.05–0.27)

## 2021-11-15 LAB — TROPONIN I (HIGH SENSITIVITY): Troponin I (High Sensitivity): 7 ng/L (ref <18)

## 2021-11-15 MED ORDER — ACETAMINOPHEN 325 MG PO TABS: 650.0000 mg | ORAL_TABLET | Freq: Four times a day (QID) | ORAL | Status: DC | PRN

## 2021-11-15 MED ORDER — INSULIN GLARGINE-YFGN 100 UNIT/ML ~~LOC~~ SOLN
12.0000 [IU] | Freq: Two times a day (BID) | SUBCUTANEOUS | Status: DC
  Administered 2021-11-15: 12 [IU] via SUBCUTANEOUS
  Filled 2021-11-15 (×2): qty 0.12

## 2021-11-15 MED ORDER — LACTATED RINGERS IV SOLN: INTRAVENOUS | Status: DC

## 2021-11-15 MED ORDER — INSULIN GLARGINE-YFGN 100 UNIT/ML ~~LOC~~ SOLN
20.0000 [IU] | Freq: Every day | SUBCUTANEOUS | Status: DC
  Filled 2021-11-15: qty 0.2

## 2021-11-15 MED ORDER — INSULIN GLARGINE-YFGN 100 UNIT/ML ~~LOC~~ SOLN: 15.0000 [IU] | Freq: Every day | SUBCUTANEOUS | Status: DC

## 2021-11-15 MED ORDER — INSULIN GLARGINE-YFGN 100 UNIT/ML ~~LOC~~ SOLN: 25.0000 [IU] | Freq: Every day | SUBCUTANEOUS | Status: DC

## 2021-11-15 MED ORDER — INSULIN ASPART 100 UNIT/ML IJ SOLN: 10.0000 [IU] | Freq: Once | INTRAMUSCULAR | Status: DC

## 2021-11-15 MED ORDER — LACTATED RINGERS IV BOLUS
1000.0000 mL | Freq: Once | INTRAVENOUS | Status: AC
  Administered 2021-11-15: 1000 mL via INTRAVENOUS

## 2021-11-15 MED ORDER — INSULIN GLARGINE-YFGN 100 UNIT/ML ~~LOC~~ SOLN
10.0000 [IU] | Freq: Every day | SUBCUTANEOUS | Status: DC
  Filled 2021-11-15: qty 0.1

## 2021-11-15 MED ORDER — ONDANSETRON HCL 4 MG/2ML IJ SOLN
4.0000 mg | Freq: Four times a day (QID) | INTRAMUSCULAR | Status: DC | PRN
  Administered 2021-11-15 (×3): 4 mg via INTRAVENOUS
  Filled 2021-11-15 (×3): qty 2

## 2021-11-15 MED ORDER — ALUM & MAG HYDROXIDE-SIMETH 200-200-20 MG/5ML PO SUSP: 30.0000 mL | Freq: Four times a day (QID) | ORAL | Status: DC | PRN

## 2021-11-15 MED ORDER — INSULIN ASPART 100 UNIT/ML IJ SOLN
0.0000 [IU] | INTRAMUSCULAR | Status: DC
  Administered 2021-11-15: 7 [IU] via SUBCUTANEOUS
  Administered 2021-11-15 (×2): 5 [IU] via SUBCUTANEOUS
  Administered 2021-11-15: 7 [IU] via SUBCUTANEOUS
  Administered 2021-11-16 (×3): 3 [IU] via SUBCUTANEOUS
  Administered 2021-11-16: 2 [IU] via SUBCUTANEOUS
  Administered 2021-11-16 – 2021-11-17 (×2): 1 [IU] via SUBCUTANEOUS

## 2021-11-15 MED ORDER — LORAZEPAM 2 MG/ML IJ SOLN
1.0000 mg | Freq: Once | INTRAMUSCULAR | Status: DC
  Filled 2021-11-15: qty 1

## 2021-11-15 MED ORDER — ACETAMINOPHEN 650 MG RE SUPP: 650.0000 mg | Freq: Four times a day (QID) | RECTAL | Status: DC | PRN

## 2021-11-15 MED ORDER — PROCHLORPERAZINE EDISYLATE 10 MG/2ML IJ SOLN
10.0000 mg | Freq: Four times a day (QID) | INTRAMUSCULAR | Status: DC | PRN
  Administered 2021-11-15: 10 mg via INTRAVENOUS
  Filled 2021-11-15: qty 2

## 2021-11-15 MED ORDER — INSULIN GLARGINE-YFGN 100 UNIT/ML ~~LOC~~ SOLN
15.0000 [IU] | Freq: Two times a day (BID) | SUBCUTANEOUS | Status: DC
  Administered 2021-11-15: 15 [IU] via SUBCUTANEOUS
  Filled 2021-11-15 (×2): qty 0.15

## 2021-11-15 MED ORDER — METOCLOPRAMIDE HCL 5 MG/ML IJ SOLN
5.0000 mg | Freq: Three times a day (TID) | INTRAMUSCULAR | Status: DC
  Administered 2021-11-15 – 2021-11-17 (×6): 5 mg via INTRAVENOUS
  Filled 2021-11-15 (×7): qty 2

## 2021-11-15 MED ORDER — PANTOPRAZOLE SODIUM 40 MG PO TBEC
40.0000 mg | DELAYED_RELEASE_TABLET | Freq: Two times a day (BID) | ORAL | Status: DC
  Administered 2021-11-15 – 2021-11-17 (×5): 40 mg via ORAL
  Filled 2021-11-15 (×5): qty 1

## 2021-11-15 MED ORDER — ENOXAPARIN SODIUM 40 MG/0.4ML IJ SOSY
40.0000 mg | PREFILLED_SYRINGE | INTRAMUSCULAR | Status: DC
  Administered 2021-11-15 – 2021-11-17 (×3): 40 mg via SUBCUTANEOUS
  Filled 2021-11-15 (×3): qty 0.4

## 2021-11-15 MED ORDER — METOCLOPRAMIDE HCL 5 MG/ML IJ SOLN: 10.0000 mg | Freq: Three times a day (TID) | INTRAMUSCULAR | Status: DC

## 2021-11-15 MED ORDER — MAGNESIUM SULFATE IN D5W 1-5 GM/100ML-% IV SOLN
1.0000 g | Freq: Once | INTRAVENOUS | Status: AC
  Administered 2021-11-15: 1 g via INTRAVENOUS
  Filled 2021-11-15 (×2): qty 100

## 2021-11-15 MED ORDER — ONDANSETRON HCL 4 MG PO TABS: 4.0000 mg | ORAL_TABLET | Freq: Four times a day (QID) | ORAL | Status: DC | PRN

## 2021-11-15 MED ORDER — INSULIN GLARGINE-YFGN 100 UNIT/ML ~~LOC~~ SOLN
15.0000 [IU] | Freq: Every day | SUBCUTANEOUS | Status: DC
Start: 2021-11-16 — End: 2021-11-15

## 2021-11-15 MED ORDER — METOPROLOL TARTRATE 5 MG/5ML IV SOLN
5.0000 mg | Freq: Three times a day (TID) | INTRAVENOUS | Status: DC | PRN
  Administered 2021-11-15: 5 mg via INTRAVENOUS
  Filled 2021-11-15 (×2): qty 5

## 2021-11-15 NOTE — ED Notes (Signed)
MD notified of recent glucose level and HR. Waiting for further orders. Will continue to monitor. No vomiting since Compazine given. No acute changes noted.

## 2021-11-15 NOTE — Progress Notes (Signed)
PROGRESS NOTE                                                                                                                                                                                                             Patient Demographics:    Zachary Vazquez, is a 36 y.o. male, DOB - 09/17/1986, WUJ:[id number]  Outpatient Primary MD for the patient is Barbette MerinoKing, Crystal M, NP    LOS - 0  Admit date - 11/14/2021    Chief Complaint  Patient presents with   Hypoglycemia       Brief Narrative (HPI from H&P)  36 year old male with history of DM type II, gastroparesis, presented to the ER with chief complaints of nausea vomiting and low blood sugars.  He was recently in the ER for a similar problem but signed out AMA before he could be admitted.  In the ER he was found to be dehydrated, hyperglycemic, had persistent nausea vomiting and was admitted for further care.   Subjective:    Zachary Vazquez today has, No headache, No chest pain, No abdominal pain - ++ Nausea, No new weakness tingling or numbness, no SOB.   Assessment  & Plan :    No notes have been filed under this hospital service. Service: Hospitalist    1.  Persistent nausea vomiting likely due to underlying diabetic gastroparesis.  Supportive care, check CT although exam is benign and do not think there is any obstruction, premeal Reglan scheduled, soft diet, as needed Zofran and Phenergan.  Hydrate and monitor.  2.  DM type I with hypoglycemia upon presentation.  Due to nausea vomiting causing reduced oral intake, cut down Lantus dose, every 4 sliding scale hydrate and monitor.  CBGs have improved.  A1c stable.  3.  Leukocytosis likely stress-induced.  Check CT, chest x-ray stable will monitor closely.  4.  AKI.  Hydrate.      Condition - Fair  Family Communication  :  None  Code Status :  Full  Consults  :    PUD Prophylaxis : PPI   Procedures  :       Gast Emptying -       Disposition Plan  :    Status is: Observation   DVT Prophylaxis  :    enoxaparin (LOVENOX) injection 40 mg Start: 11/15/21 0800  Lab Results  Component Value Date   PLT 238 11/15/2021    Diet :  Diet Order             Diet clear liquid Room service appropriate? Yes; Fluid consistency: Thin  Diet effective now                    Inpatient Medications  Scheduled Meds:  enoxaparin (LOVENOX) injection  40 mg Subcutaneous Q24H   insulin aspart  0-9 Units Subcutaneous Q4H   insulin glargine-yfgn  12 Units Subcutaneous BID   sodium chloride flush  3 mL Intravenous Q12H   Continuous Infusions:  sodium chloride     lactated ringers     PRN Meds:.sodium chloride, acetaminophen **OR** acetaminophen, ondansetron **OR** ondansetron (ZOFRAN) IV, prochlorperazine, sodium chloride flush  Antibiotics  :    Anti-infectives (From admission, onward)    None        Time Spent in minutes  30   Susa Raring M.D on 11/15/2021 at 11:04 AM  To page go to [email or web address]   [facility] -  Office  [phone]  See all Orders from today for further details    Objective:   Vitals:   11/15/21 0500 11/15/21 0530 11/15/21 0600 11/15/21 0900  BP: (!) 110/57 109/63 104/63 107/60  Pulse: (!) 111 (!) 116 (!) 121 (!) 118  Resp: 20 11 (!) 22 19  Temp:      TempSrc:      SpO2: 97% 96% 97% 96%  Weight:      Height:        Wt Readings from Last 3 Encounters:  11/14/21 85 kg  10/12/21 86.7 kg  08/26/21 83 kg     Intake/Output Summary (Last 24 hours) at 11/15/2021 1104 Last data filed at 11/15/2021 0352 Gross per 24 hour  Intake 670.61 ml  Output --  Net 670.61 ml     Physical Exam  Awake Alert, No new F.N deficits, Normal affect [place].AT,PERRAL Supple Neck, No JVD,   Symmetrical Chest wall movement, Good air movement bilaterally, CTAB RRR,No Gallops,Rubs or new Murmurs,  +ve B.Sounds, Abd Soft, No tenderness,   No Cyanosis,  Clubbing or edema       Data Review:    CBC Recent Labs  Lab 11/14/21 2230 11/15/21 0319  WBC 14.9* 18.6*  HGB 15.3 15.6  HCT 46.4 45.6  PLT 278 238  MCV 93.4 92.3  MCH 30.8 31.6  MCHC 33.0 34.2  RDW 12.0 12.1  LYMPHSABS 2.1  --   MONOABS 1.0  --   EOSABS 0.2  --   BASOSABS 0.1  --     Electrolytes Recent Labs  Lab 11/14/21 2230 11/15/21 0319  NA 135 135  K 4.0 4.0  CL 103 99  CO2 24 21*  GLUCOSE 50* 244*  BUN 19 19  CREATININE 1.30* 1.30*  CALCIUM 9.1 9.3  HGBA1C  --  6.7*    ------------------------------------------------------------------------------------------------------------------ No results for input(s): CHOL, HDL, LDLCALC, TRIG, CHOLHDL, LDLDIRECT in the last 72 hours.  Lab Results  Component Value Date   HGBA1C 6.7 (H) 11/15/2021    No results for input(s): TSH, T4TOTAL, T3FREE, THYROIDAB in the last 72 hours.  Invalid input(s): FREET3 ------------------------------------------------------------------------------------------------------------------ ID Labs Recent Labs  Lab 11/14/21 2230 11/15/21 0319  WBC 14.9* 18.6*  PLT 278 238  CREATININE 1.30* 1.30*   Cardiac Enzymes No results for input(s): CKMB, TROPONINI, MYOGLOBIN in the last 168 hours.  Invalid input(s): CK  Radiology Reports  DG Chest 2 View  Result Date: 11/14/2021 CLINICAL DATA:  Syncope, aspiration EXAM: CHEST - 2 VIEW COMPARISON:  08/26/2021 FINDINGS: Frontal and lateral views of the chest demonstrate an unremarkable cardiac silhouette. No acute airspace disease, effusion, or pneumothorax. No acute bony abnormalities. IMPRESSION: 1. No acute intrathoracic process. Electronically Signed   By: Sharlet Salina M.D.   On: 11/14/2021 22:46

## 2021-11-15 NOTE — ED Notes (Signed)
Admitting Provider at bedside. 

## 2021-11-15 NOTE — ED Notes (Signed)
Pt continues to vomit and be nauseated despite medications given. MD notified and additional orders received. Will continue to monitor.

## 2021-11-15 NOTE — H&P (Signed)
History and Physical    Zachary Vazquez KNL:[id number] DOB: 14-Oct-1986 DOA: 11/14/2021  PCP: Barbette Merino, NP  Patient coming from: Home  I have personally briefly reviewed patient's old medical records in [facility]  Chief Complaint: Hypoglycemia, AMS  HPI: Zachary Vazquez is a 36 y.o. male with medical history significant of DM1, gastroparesis.  Pt presents to ED with N/V and hypoglycemia.  Not giving a great history at this time.  Looks like similar presentation in Oct of last year: at that time he had ran out of short acting insulin, took extra long acting insulin and wound up in the ED with hypoglycemia, N/V, and AMS.  Treated with IV glucose, resolved, and pt left AMA before day team could see him at that time.  ED Course: Pt with persistent hypoglycemia in ED, started on D10.  Pt denies: Taking extra insulin, though hes unable to tell me his regimen. Self harm or SI   States hes not sure what happened.   Past Medical History:  Diagnosis Date   History of delayed wound healing 10/2019   Rash and nonspecific skin eruption 10/2019   Type 1 diabetes mellitus (HCC)    Vitamin D deficiency 09/2020   Wound of lower extremity, left, sequela 10/2019    Past Surgical History:  Procedure Laterality Date   TONSILLECTOMY       reports that he has been smoking cigarettes. He has a 15.00 pack-year smoking history. He has never used smokeless tobacco. He reports current alcohol use. He reports that he does not currently use drugs after having used the following drugs: Marijuana.  Allergies  Allergen Reactions   Clindamycin/Lincomycin Anaphylaxis   Bactrim [Sulfamethoxazole-Trimethoprim] Swelling and Other (See Comments)    Eyes swell and bumps appear on/near the eyes     Family History  Problem Relation Age of Onset   Hypertension Mother    Diabetes Mellitus I Brother     Prior to Admission medications   Medication Sig Start Date End Date Taking?  Authorizing Provider  Continuous Blood Gluc Receiver (DEXCOM G6 RECEIVER) DEVI Apply once a week 10/12/21   Barbette Merino, NP  Continuous Blood Gluc Sensor (DEXCOM G6 SENSOR) MISC Apply once a week 10/12/21 10/12/22  Barbette Merino, NP  desonide (DESOWEN) 0.05 % lotion APPLY 1 SMALL AMOUNT TO AFFECTED AREA TWICE A DAY. Patient not taking: Reported on 08/27/2021 11/22/20 11/22/21  Wynona Canes, MD  desonide (DESOWEN) 0.05 % ointment APPLY 1 A SMALL AMOUNT TO AFFECTED AREA ONCE A DAY Patient not taking: Reported on 08/27/2021 11/22/20 11/22/21  Wynona Canes, MD  insulin aspart (NOVOLOG) 100 UNIT/ML injection Use as directed per sliding scale Patient taking differently: Inject 0-10 Units into the skin See admin instructions. Per sliding scale 3 times daily with meals 06/11/21   Barbette Merino, NP  insulin glargine (LANTUS) 100 UNIT/ML injection Inject 0.35 mLs (35 Units total) into the skin at bedtime. 07/13/21 07/13/22  Barbette Merino, NP  pantoprazole (PROTONIX) 40 MG tablet TAKE 1 TABLET BY MOUTH DAILY. Patient not taking: Reported on 08/27/2021 04/16/21 05/07/21      Physical Exam: Vitals:   11/14/21 2221 11/14/21 2300 11/14/21 2330 11/15/21 0000  BP:  (!) 141/92 (!) 144/94 (!) 149/91  Pulse:  (!) 111 (!) 110 (!) 113  Resp:  16 14 19   Temp:      TempSrc:      SpO2:  99% 100% 100%  Weight: 85 kg  Height: 5\' 11"  (1.803 m)       Constitutional: Ill appearing, holding emesis bag Eyes: PERRL, lids and conjunctivae normal ENMT: Mucous membranes are moist. Posterior pharynx clear of any exudate or lesions.Normal dentition.  Neck: normal, supple, no masses, no thyromegaly Respiratory: clear to auscultation bilaterally, no wheezing, no crackles. Normal respiratory effort. No accessory muscle use.  Cardiovascular: Tachycardic Abdomen: no tenderness, no masses palpated. No hepatosplenomegaly. Bowel sounds positive.  Musculoskeletal: no clubbing / cyanosis. No joint deformity upper and  lower extremities. Good ROM, no contractures. Normal muscle tone.  Skin: no rashes, lesions, ulcers. No induration Neurologic: CN 2-12 grossly intact. Sensation intact, DTR normal. Strength 5/5 in all 4.  Psychiatric: Flat affect.   Labs on Admission: I have personally reviewed following labs and imaging studies  CBC: Recent Labs  Lab 11/14/21 2230  WBC 14.9*  NEUTROABS 11.3*  HGB 15.3  HCT 46.4  MCV 93.4  PLT 278   Basic Metabolic Panel: Recent Labs  Lab 11/14/21 2230  NA 135  K 4.0  CL 103  CO2 24  GLUCOSE 50*  BUN 19  CREATININE 1.30*  CALCIUM 9.1   GFR: Estimated Creatinine Clearance: 85.3 mL/min (A) (by C-G formula based on SCr of 1.3 mg/dL (H)). Liver Function Tests: No results for input(s): AST, ALT, ALKPHOS, BILITOT, PROT, ALBUMIN in the last 168 hours. Recent Labs  Lab 11/14/21 2230  LIPASE 21   No results for input(s): AMMONIA in the last 168 hours. Coagulation Profile: No results for input(s): INR, PROTIME in the last 168 hours. Cardiac Enzymes: No results for input(s): CKTOTAL, CKMB, CKMBINDEX, TROPONINI in the last 168 hours. BNP (last 3 results) No results for input(s): PROBNP in the last 8760 hours. HbA1C: No results for input(s): HGBA1C in the last 72 hours. CBG: Recent Labs  Lab 11/14/21 2213 11/14/21 2306 11/15/21 0002  GLUCAP 87 29* 116*   Lipid Profile: No results for input(s): CHOL, HDL, LDLCALC, TRIG, CHOLHDL, LDLDIRECT in the last 72 hours. Thyroid Function Tests: No results for input(s): TSH, T4TOTAL, FREET4, T3FREE, THYROIDAB in the last 72 hours. Anemia Panel: No results for input(s): VITAMINB12, FOLATE, FERRITIN, TIBC, IRON, RETICCTPCT in the last 72 hours. Urine analysis:    Component Value Date/Time   COLORURINE YELLOW 08/27/2021 0106   APPEARANCEUR CLEAR 08/27/2021 0106   APPEARANCEUR Clear 10/11/2020 1442   LABSPEC 1.017 08/27/2021 0106   PHURINE 7.0 08/27/2021 0106   GLUCOSEU 50 (A) 08/27/2021 0106   HGBUR  NEGATIVE 08/27/2021 0106   BILIRUBINUR NEGATIVE 08/27/2021 0106   BILIRUBINUR negative 07/13/2021 1101   BILIRUBINUR neg 01/09/2021 1044   BILIRUBINUR Negative 10/11/2020 1442   KETONESUR 20 (A) 08/27/2021 0106   PROTEINUR NEGATIVE 08/27/2021 0106   UROBILINOGEN 1.0 07/13/2021 1101   UROBILINOGEN 0.2 01/19/2018 0838   NITRITE NEGATIVE 08/27/2021 0106   LEUKOCYTESUR NEGATIVE 08/27/2021 0106    Radiological Exams on Admission: DG Chest 2 View  Result Date: 11/14/2021 CLINICAL DATA:  Syncope, aspiration EXAM: CHEST - 2 VIEW COMPARISON:  08/26/2021 FINDINGS: Frontal and lateral views of the chest demonstrate an unremarkable cardiac silhouette. No acute airspace disease, effusion, or pneumothorax. No acute bony abnormalities. IMPRESSION: 1. No acute intrathoracic process. Electronically Signed   By: Sharlet SalinaMichael  Brown M.D.   On: 11/14/2021 22:46    EKG: Independently reviewed.  Assessment/Plan Principal Problem:   Hypoglycemia Active Problems:   Acute metabolic encephalopathy    Hypoglycemia - Presumably related to insulin, not sure if he took extra or what happened  at this point. Q1H CBGs Holding insulin for the moment D10 half at 100 cc/hr D50 PRN if needed N/V - Scheduled IV reglan PRN zofran Does have h/o cannabis use previously, but had intractable N/V in Oct 22 with neg UDS at that time, so suspicion at this point is that diabetic gastroparesis likely present here. AME - Secondary to #1 most likely Flat affect and I suspect he is not telling me the full history, but patient clearly denies SI, denies suicide attempt, denies self harm thoughts or attempt.  I dont see that he has a history of any of these or any known psychiatric conditions that would predispose him to these (IE BPD).  DVT prophylaxis: Lovenox Code Status: Full Family Communication: No family in room Disposition Plan: Home after BGL stable and AMS resolved Consults called: None Admission status: Place in  obs    Shavawn Stobaugh M. DO [facility]  How to contact the [facility] Attending or Consulting provider 7A - 7P or covering provider during after hours 7P -7A, for this patient?  Check the care team in [facility] and look for a) attending/consulting [facility] provider listed and b) the [facility] team listed Log into [email or web address]  Amion Physician Scheduling and messaging for groups and whole hospitals  On call and physician scheduling software for group practices, residents, hospitalists and other medical providers for call, clinic, rotation and shift schedules. On[employer] is a hospital-wide system for scheduling doctors and paging doctors on call. EasyPlot is for scientific plotting and data analysis.  [email or web address]  and use [place]'s universal password to access. If you do not have the password, please contact the hospital operator.  Locate the [facility] provider you are looking for under [facility] and page to a number that you can be directly reached. If you still have difficulty reaching the provider, please page the [facility] (Director on Call) for the Hospitalists listed on amion for assistance.  11/15/2021, 12:20 AM

## 2021-11-15 NOTE — Progress Notes (Addendum)
D10 stopped an hour ago, despite this CBG still elevating (presumably long acting lantus that was causing persistent hypoglycemia has worn off).  1) 10u novolog now 2) starting Q4H SSI 3) 10u lantus now to give him some basal  Q4H SSI and lower lantus dose due to persistent N/V.  No anion gap, only 15 keytones in urine this AM.  Doubt DKA present or playing a major role in persistent N/V.  Will check BHB to make sure.

## 2021-11-15 NOTE — Evaluation (Addendum)
I agree with the following treatment note after review of the documentation. This session was performed under the supervision of a licensed clinician.   Cindee Salt, DPT  Acute Rehabilitation Services  Pager: [phone]    Physical Therapy Evaluation Patient Details Name: Zachary Vazquez MRN: [id number] DOB: 1985/12/27 Today's Date: 11/15/2021  History of Present Illness  Pt is a 36 y/o male who presented to the ED with nausea and vomitting and decreased responsiveness. Testing found that he was hypoglycemic. He has a PMH of DM type 1 and gastroparesis.   Clinical Impression  Pt presented with the symptoms above and impairments below. Session limited by feelings of nausea and elevated HR. Nausea symptoms stable throughout and did not change with position. Pt requiring min guard for mobility tasks. Upon entering the room HR was 118, it sayed between 120-140 with bed mobility, and maxed at 169 with standing side steps. HR returned to upper 120s-low 130s upon leaving the room. During physical exam patient showed deficits in endurance, activity tolerance. Anticipate pt will progress well and will not require follow up PT at d/c. Will continue to follow acutely to maximize functional mobility, independence, and safety.      Recommendations for follow up therapy are one component of a multi-disciplinary discharge planning process, led by the attending physician.  Recommendations may be updated based on patient status, additional functional criteria and insurance authorization.  Follow Up Recommendations No PT follow up    Assistance Recommended at Discharge PRN  Patient can return home with the following       Equipment Recommendations None recommended by PT  Recommendations for Other Services       Functional Status Assessment Patient has had a recent decline in their functional status and demonstrates the ability to make significant improvements in function in a reasonable and  predictable amount of time.     Precautions / Restrictions Precautions Precautions: Fall;Other (comment) Precaution Comments: Watch HR Restrictions Weight Bearing Restrictions: No      Mobility  Bed Mobility                    Transfers Overall transfer level: Needs assistance   Transfers: Sit to/from Stand Sit to Stand: Min guard           General transfer comment: Min guard for safety purposes.    Ambulation/Gait               General Gait Details: Took side steps at EOB. Min guard for safety. Further mobility limited secondary to elevated HR and nausea. HR max at 169. Decreased back to upper 120s-low 130s upon return to supine.  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     Occupational psychologist: Right    Extremity/Trunk Assessment   Upper Extremity Assessment Upper Extremity Assessment: Overall WFL for tasks assessed    Lower Extremity Assessment Lower Extremity Assessment: Overall WFL for tasks assessed    Cervical / Trunk Assessment Cervical / Trunk Assessment: Normal  Communication   Communication: No difficulties  Cognition                                                General Comments      Exercises     Assessment/Plan    PT Assessment Patient needs continued PT  services  PT Problem List Decreased activity tolerance;Decreased mobility;Cardiopulmonary status limiting activity       PT Treatment Interventions Gait training;Therapeutic exercise;Balance training;Neuromuscular re-education;Stair training;Functional mobility training;Therapeutic activities;Patient/family education;Manual techniques    PT Goals (Current goals can be found in the Care Plan section)  Acute Rehab PT Goals Patient Stated Goal: Return to normal Work and ADLs PT Goal Formulation: With patient Time For Goal Achievement: 11/29/21 Potential to Achieve Goals: Good    Frequency Min 3X/week     Co-evaluation               AM-PAC PT "6 Clicks" Mobility  Outcome Measure Help needed turning from your back to your side while in a flat bed without using bedrails?: None Help needed moving from lying on your back to sitting on the side of a flat bed without using bedrails?: None Help needed moving to and from a bed to a chair (including a wheelchair)?: A Little Help needed standing up from a chair using your arms (e.g., wheelchair or bedside chair)?: A Little Help needed to walk in hospital room?: A Little Help needed climbing 3-5 steps with a railing? : A Little 6 Click Score: 20    End of Session   Activity Tolerance: Treatment limited secondary to medical complications (Comment) (elevated HR and nausea) Patient left: in bed;with call bell/phone within reach (on stretcher in ED) Nurse Communication: Mobility status PT Visit Diagnosis: Difficulty in walking, not elsewhere classified (R26.2);Other abnormalities of gait and mobility (R26.89)    Time: 7096-2836 PT Time Calculation (min) (ACUTE ONLY): 18 min   Charges:   PT Evaluation $PT Eval Moderate Complexity: 1 Mod          Armanda Heritage, SPT   Zachary Vazquez 11/15/2021, 10:59 AM

## 2021-11-15 NOTE — Progress Notes (Addendum)
Inpatient Diabetes Program Recommendations  AACE/ADA: New Consensus Statement on Inpatient Glycemic Control  Target Ranges:  Prepandial:   less than 140 mg/dL      Peak postprandial:   less than 180 mg/dL (1-2 hours)      Critically ill patients:  140 - 180 mg/dL    Latest Reference Range & Units 11/15/21 00:02 11/15/21 01:03 11/15/21 02:02 11/15/21 03:02 11/15/21 04:03 11/15/21 05:06 11/15/21 06:03 11/15/21 07:51  Glucose-Capillary 70 - 99 mg/dL 116 (H) 175 (H) 219 (H) 295 (H) 301 (H) 432 (H) 291 (H) 302 (H)    Latest Reference Range & Units 11/14/21 22:13 11/14/21 23:06  Glucose-Capillary 70 - 99 mg/dL 87 29 (LL)    Latest Reference Range & Units 11/14/21 22:30  Glucose 70 - 99 mg/dL 50 (L)   Review of Glycemic Control  Diabetes history: DM1 Outpatient Diabetes medications: Lantus 30 units QHS, Novolog 0-10 units TID with meals (patient reports typically takes 2-7 units at a time based on glucose and food intake) Current orders for Inpatient glycemic control: Semglee 15 units BID, Novolog 0-9 units Q4H  Inpatient Diabetes Program Recommendations:    Insulin: Patient initially hypoglycemic. Glucose 291 mg/dl  at 6:03 am today and patient received Novolog 5 units at 6:16 am. Patient received Semglee 15 units at 8:22 am today.  NOTE: Per chart notes, patient brought in by EMS due to hypoglycemia. Patient has Type 1 DM and has Lantus and Novolog listed as outpatient insulins. Per chart, patient does not have insurance. Last office note by Dionisio David, NP ([place]) on 10/12/21 notes "He is trying for financial assistance on hold until Jan. His CBG this am was 68 this morning. He has not been having a lot of lows. He  "flurts with lows" . He has been asymptomatic with CBG 's in the 20-30's in the past. He is trying to maintain his current A1C due to the long struggle to get it in a controlled range. He tries to due it with diet and adjusting the dose of insulin." Per  medication list noted on 10/12/21 is prescribed Lantus 35 units QHS and Novolog 0-10 units TID with meals. Will plan to speak with patient today.  Addendum 11/15/21@12 :05-Spoke with patient at bedside regarding DM. Patient sitting up in bed with lunch tray at bedside and holding emesis bag. Patient reports he still feels nauseous and not feeling like he can eat. Patient reports that has had N/V for last few days at home and poor PO intake. Patient states that he has plenty of Lantus, Novolog, and testing supplies at home. Patient reports that he is able to get insulin from clinic and no issues with getting his DM medications. Patient reports he thinks he last took Lantus yesterday evening at home and not sure if he took any Novolog at that time or not. He reports that he was taking Novolog if needed based on glucose since he has not been able to each much. Patient states he is unsure exactly what happened that caused his glucose to drop so low. Patient reports that he can usually recognize hypoglycemia symptoms when glucose is in 70's mg/dl.  Asked patient if he knew if the clinic has availability of FreeStyle Libre2 CGM sensors and patient states he is not sure. Encouraged patient to ask clinic staff if they have any FreeStyle LIbre2 CGM sensors. Explained how Free[employer] works and discussed alarms for hypoglycemia and hypoglycemia with the CGM sensor. Providers could use  the information collected from [place] to help patient get DM under better control. Patient states he has everything he needs at home for DM control. Patient verbalized understanding of information discussed and he states he has no questions at this time.   Thanks, Barnie Alderman, RN, MSN, CDE Diabetes Coordinator Inpatient Diabetes Program [phone] (Team Pager from 8am to 5pm)

## 2021-11-15 NOTE — ED Notes (Signed)
Spoke with Dr. Thedore Mins regarding pt's lantus insulin order. Due to glucose dropping from 432 to 291 in 1 hour without intervention. MD states to change lantus insulin to 15 units at this time. Will continue to monitor glucose levels every hour. No acute changes noted. Will continue to monitor.

## 2021-11-16 LAB — COMPREHENSIVE METABOLIC PANEL
ALT: 20 U/L (ref 0–44)
AST: 31 U/L (ref 15–41)
Albumin: 3.6 g/dL (ref 3.5–5.0)
Alkaline Phosphatase: 57 U/L (ref 38–126)
Anion gap: 9 (ref 5–15)
BUN: 16 mg/dL (ref 6–20)
CO2: 25 mmol/L (ref 22–32)
Calcium: 8.9 mg/dL (ref 8.9–10.3)
Chloride: 104 mmol/L (ref 98–111)
Creatinine, Ser: 1.29 mg/dL — ABNORMAL HIGH (ref 0.61–1.24)
GFR, Estimated: >60 mL/min (ref >60)
Glucose, Bld: 141 mg/dL — ABNORMAL HIGH (ref 70–99)
Potassium: 3.9 mmol/L (ref 3.5–5.1)
Sodium: 138 mmol/L (ref 135–145)
Total Bilirubin: 1.1 mg/dL (ref 0.3–1.2)
Total Protein: 6.4 g/dL — ABNORMAL LOW (ref 6.5–8.1)

## 2021-11-16 LAB — CBC WITH DIFFERENTIAL/PLATELET
Abs Immature Granulocytes: 0.04 10*3/uL (ref 0.00–0.07)
Basophils Absolute: 0 10*3/uL (ref 0.0–0.1)
Basophils Relative: 0 %
Eosinophils Absolute: 0.1 10*3/uL (ref 0.0–0.5)
Eosinophils Relative: 1 %
HCT: 40.9 % (ref 39.0–52.0)
Hemoglobin: 13.9 g/dL (ref 13.0–17.0)
Immature Granulocytes: 0 %
Lymphocytes Relative: 20 %
Lymphs Abs: 2.1 10*3/uL (ref 0.7–4.0)
MCH: 31.6 pg (ref 26.0–34.0)
MCHC: 34 g/dL (ref 30.0–36.0)
MCV: 93 fL (ref 80.0–100.0)
Monocytes Absolute: 0.8 10*3/uL (ref 0.1–1.0)
Monocytes Relative: 7 %
Neutro Abs: 7.3 10*3/uL (ref 1.7–7.7)
Neutrophils Relative %: 72 %
Platelets: 212 10*3/uL (ref 150–400)
RBC: 4.4 MIL/uL (ref 4.22–5.81)
RDW: 12.1 % (ref 11.5–15.5)
WBC: 10.3 10*3/uL (ref 4.0–10.5)
nRBC: 0 % (ref 0.0–0.2)

## 2021-11-16 LAB — GLUCOSE, CAPILLARY
Glucose-Capillary: 112 mg/dL — ABNORMAL HIGH (ref 70–99)
Glucose-Capillary: 140 mg/dL — ABNORMAL HIGH (ref 70–99)
Glucose-Capillary: 173 mg/dL — ABNORMAL HIGH (ref 70–99)
Glucose-Capillary: 242 mg/dL — ABNORMAL HIGH (ref 70–99)
Glucose-Capillary: 243 mg/dL — ABNORMAL HIGH (ref 70–99)
Glucose-Capillary: 246 mg/dL — ABNORMAL HIGH (ref 70–99)

## 2021-11-16 LAB — BRAIN NATRIURETIC PEPTIDE: B Natriuretic Peptide: 124.6 pg/mL — ABNORMAL HIGH (ref 0.0–100.0)

## 2021-11-16 LAB — MAGNESIUM: Magnesium: 1.9 mg/dL (ref 1.7–2.4)

## 2021-11-16 MED ORDER — INSULIN GLARGINE-YFGN 100 UNIT/ML ~~LOC~~ SOLN
15.0000 [IU] | Freq: Two times a day (BID) | SUBCUTANEOUS | Status: DC
  Administered 2021-11-16 (×2): 15 [IU] via SUBCUTANEOUS
  Filled 2021-11-16 (×3): qty 0.15

## 2021-11-16 MED ORDER — LACTATED RINGERS IV SOLN: INTRAVENOUS | Status: AC

## 2021-11-16 NOTE — Plan of Care (Signed)

## 2021-11-16 NOTE — Progress Notes (Signed)
PROGRESS NOTE                                                                                                                                                                                                             Patient Demographics:    Zachary Vazquez, is a 36 y.o. male, DOB - 10/17/1986, ZOX:[id number]  Outpatient Primary MD for the patient is Barbette MerinoKing, Crystal M, NP    LOS - 1  Admit date - 11/14/2021    Chief Complaint  Patient presents with   Hypoglycemia       Brief Narrative (HPI from H&P)  36 year old male with history of DM type II, gastroparesis, presented to the ER with chief complaints of nausea vomiting and low blood sugars.  He was recently in the ER for a similar problem but signed out AMA before he could be admitted.  In the ER he was found to be dehydrated, hyperglycemic, had persistent nausea vomiting and was admitted for further care.   Subjective:   Patient in bed looks and feels much better than yesterday, no headache chest or abdominal pain, nausea much improved.   Assessment  & Plan :    No notes have been filed under this hospital service. Service: Hospitalist    1.  Persistent nausea vomiting likely due to underlying diabetic gastroparesis.  Supportive care, nonacute CT scan abdomen pelvis, premeal Reglan scheduled, soft diet, as needed Zofran and Phenergan.  Improving with supportive care and hydration which will be continued outpatient GI follow-up post discharge for gastroparesis work-up.  2.  DM type I with hypoglycemia upon presentation.  Due to nausea vomiting causing reduced oral intake, labile A1c and outpatient control, have adjusted Lantus dose further on 11/16/2021 cautiously monitor with improving oral intake.  3.  Leukocytosis likely stress-induced.  Resolved after supportive care CT abdomen unremarkable..  4.  AKI.  Hydrate with IV fluids.  Lab Results  Component Value Date    HGBA1C 6.7 (H) 11/15/2021    CBG (last 3)  Recent Labs    11/16/21 0055 11/16/21 0424 11/16/21 0730  GLUCAP 173* 140* 112*         Condition - Fair  Family Communication  :  None  Code Status :  Full  Consults  :    PUD Prophylaxis : PPI   Procedures  :  CT - No acute CT finding to explain abdominal pain. Chronic calcifications at the right dome of the liver favored to represent calcifications within a hemangioma. Aortic atherosclerosis, mild. Minor superior endplate fractures at T10 and T11, new since July of 2021 but likely chronic and healed.       Disposition Plan  :    Status is: Observation   DVT Prophylaxis  :    enoxaparin (LOVENOX) injection 40 mg Start: 11/15/21 0800    Lab Results  Component Value Date   PLT 212 11/16/2021    Diet :  Diet Order             DIET SOFT Room service appropriate? Yes; Fluid consistency: Thin  Diet effective now                    Inpatient Medications  Scheduled Meds:  enoxaparin (LOVENOX) injection  40 mg Subcutaneous Q24H   insulin aspart  0-9 Units Subcutaneous Q4H   insulin glargine-yfgn  15 Units Subcutaneous BID   LORazepam  1 mg Intravenous Once   metoCLOPramide (REGLAN) injection  5 mg Intravenous TID AC   pantoprazole  40 mg Oral BID   sodium chloride flush  3 mL Intravenous Q12H   Continuous Infusions:  sodium chloride     lactated ringers 100 mL/hr at 11/15/21 1225   PRN Meds:.sodium chloride, acetaminophen **OR** acetaminophen, alum & mag hydroxide-simeth, metoprolol tartrate, ondansetron **OR** ondansetron (ZOFRAN) IV, prochlorperazine, sodium chloride flush  Antibiotics  :    Anti-infectives (From admission, onward)    None        Time Spent in minutes  30   Susa Raring M.D on 11/16/2021 at 10:18 AM  To page go to [email or web address]   [facility] -  Office  [phone]  See all Orders from today for further details    Objective:   Vitals:   11/15/21 1645  11/15/21 2128 11/16/21 0629 11/16/21 0829  BP: 129/65 126/71 135/81 127/76  Pulse: (!) 109 97 90 87  Resp: 17 18 17 18   Temp: 98.5 F (36.9 C) 98.2 F (36.8 C) 98.6 F (37 C) 98.5 F (36.9 C)  TempSrc:  Oral Oral Oral  SpO2: 97% 97% 99% 99%  Weight:      Height:        Wt Readings from Last 3 Encounters:  11/14/21 85 kg  10/12/21 86.7 kg  08/26/21 83 kg     Intake/Output Summary (Last 24 hours) at 11/16/2021 1018 Last data filed at 11/16/2021 0830 Gross per 24 hour  Intake 1945.74 ml  Output --  Net 1945.74 ml     Physical Exam  Awake Alert, No new F.N deficits, Normal affect [place].AT,PERRAL Supple Neck, No JVD,   Symmetrical Chest wall movement, Good air movement bilaterally, CTAB RRR,No Gallops, Rubs or new Murmurs,  +ve B.Sounds, Abd Soft, No tenderness,   No Cyanosis, Clubbing or edema        Data Review:    CBC Recent Labs  Lab 11/14/21 2230 11/15/21 0319 11/16/21 0421  WBC 14.9* 18.6* 10.3  HGB 15.3 15.6 13.9  HCT 46.4 45.6 40.9  PLT 278 238 212  MCV 93.4 92.3 93.0  MCH 30.8 31.6 31.6  MCHC 33.0 34.2 34.0  RDW 12.0 12.1 12.1  LYMPHSABS 2.1  --  2.1  MONOABS 1.0  --  0.8  EOSABS 0.2  --  0.1  BASOSABS 0.1  --  0.0    Electrolytes Recent Labs  Lab 11/14/21 2230 11/15/21 0319 11/15/21 1202 11/16/21 0421  NA 135 135  --  138  K 4.0 4.0  --  3.9  CL 103 99  --  104  CO2 24 21*  --  25  GLUCOSE 50* 244*  --  141*  BUN 19 19  --  16  CREATININE 1.30* 1.30*  --  1.29*  CALCIUM 9.1 9.3  --  8.9  AST  --   --   --  31  ALT  --   --   --  20  ALKPHOS  --   --   --  57  BILITOT  --   --   --  1.1  ALBUMIN  --   --   --  3.6  MG  --   --   --  1.9  TSH  --   --  5.481*  --   HGBA1C  --  6.7*  --   --   BNP  --   --   --  124.6*    ------------------------------------------------------------------------------------------------------------------ No results for input(s): CHOL, HDL, LDLCALC, TRIG, CHOLHDL, LDLDIRECT in the last 72  hours.  Lab Results  Component Value Date   HGBA1C 6.7 (H) 11/15/2021    Recent Labs    11/15/21 1202  TSH 5.481*   ------------------------------------------------------------------------------------------------------------------ ID Labs Recent Labs  Lab 11/14/21 2230 11/15/21 0319 11/16/21 0421  WBC 14.9* 18.6* 10.3  PLT 278 238 212  CREATININE 1.30* 1.30* 1.29*   Cardiac Enzymes No results for input(s): CKMB, TROPONINI, MYOGLOBIN in the last 168 hours.  Invalid input(s): CK  Radiology Reports CT ABDOMEN PELVIS WO CONTRAST  Result Date: 11/15/2021 CLINICAL DATA:  Abdominal pain, acute, nonlocalized.  Hypoglycemia. EXAM: CT ABDOMEN AND PELVIS WITHOUT CONTRAST TECHNIQUE: Multidetector CT imaging of the abdomen and pelvis was performed following the standard protocol without IV contrast. RADIATION DOSE REDUCTION: This exam was performed according to the departmental dose-optimization program which includes automated exposure control, adjustment of the mA and/or kV according to patient size and/or use of iterative reconstruction technique. COMPARISON:  05/11/2020 FINDINGS: Lower chest: Normal Hepatobiliary: Redemonstration of coarse calcifications at the right dome of the liver, favored to represent calcifications within a hemangioma. These are not changed and would not be clinically significant. Pancreas: No significant finding. Spleen: Normal Adrenals/Urinary Tract: Adrenal glands are normal. Kidneys are normal. No cyst, mass, stone or hydronephrosis. Stomach/Bowel: No abnormal bowel finding. No evidence of obstruction, ileus, inflammatory change or focal lesion. Vascular/Lymphatic: Aortic atherosclerosis, minimal. IVC is normal. No adenopathy. Reproductive: Normal Other: No free fluid or air. Musculoskeletal: Old minor superior endplate deformities at T10 and T11. No evidence of recent injury. These do appear new since July of 2021 however. IMPRESSION: No acute CT finding to explain  abdominal pain. Chronic calcifications at the right dome of the liver favored to represent calcifications within a hemangioma. Aortic atherosclerosis, mild. Minor superior endplate fractures at T10 and T11, new since July of 2021 but likely chronic and healed. Electronically Signed   By: Paulina Fusi M.D.   On: 11/15/2021 11:50   DG Chest 2 View  Result Date: 11/14/2021 CLINICAL DATA:  Syncope, aspiration EXAM: CHEST - 2 VIEW COMPARISON:  08/26/2021 FINDINGS: Frontal and lateral views of the chest demonstrate an unremarkable cardiac silhouette. No acute airspace disease, effusion, or pneumothorax. No acute bony abnormalities. IMPRESSION: 1. No acute intrathoracic process. Electronically Signed   By: Sharlet Salina M.D.   On: 11/14/2021 22:46

## 2021-11-16 NOTE — TOC Initial Note (Signed)
Transition of Care [facility]) - Initial/Assessment Note    Patient Details  Name: Zachary Vazquez MRN: [id number] Date of Birth: [birth year]  [facility]) CM/SW Contact:    Tom-Johnson, Hershal Coria, RN Phone Number: 11/16/2021, 3:46 PM  Clinical Narrative:                 CM spoke with patient at bedside about needs for post hospital transition. Admitted for Hypoglycemia. Patient states he lives at home with is mother. His Father is present but support is minimal. Does not have any children. 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 Independent with care and able to drive self prior to hospitalization. States he gets his medications from [facility] and his PCP is at [facility]. CM spoke with patient about substance abuse and education done. Patient declines any education materials at this time. No PT followup. CM will continue to follow with needs.   Expected Discharge Plan: Home/Self Care Barriers to Discharge: Continued Medical Work up   Patient Goals and CMS Choice Patient states their goals for this hospitalization and ongoing recovery are:: To return home CMS Medicare.gov Compare Post Acute Care list provided to:: Patient Choice offered to / list presented to : NA  Expected Discharge Plan and Services Expected Discharge Plan: Home/Self Care   Discharge Planning Services: CM Consult Post Acute Care Choice: NA Living arrangements for the past 2 months: Single Family Home                 DME Arranged: N/A DME Agency: NA       HH Arranged: NA HH Agency: NA        Prior Living Arrangements/Services Living arrangements for the past 2 months: Single Family Home Lives with:: Parents (Mother) Patient language and need for interpreter reviewed:: Yes Do you feel safe going back to the place where you live?: Yes      Need for Family Participation in Patient Care: Yes (Comment) Care giver support  system in place?: Yes (comment)   Criminal Activity/Legal Involvement Pertinent to Current Situation/Hospitalization: No - Comment as needed  Activities of Daily Living Home Assistive Devices/Equipment: None ADL Screening (condition at time of admission) Patient's cognitive ability adequate to safely complete daily activities?: Yes Is the patient deaf or have difficulty hearing?: No Does the patient have difficulty seeing, even when wearing glasses/contacts?: No Does the patient have difficulty concentrating, remembering, or making decisions?: No Patient able to express need for assistance with ADLs?: Yes Does the patient have difficulty dressing or bathing?: No Independently performs ADLs?: Yes (appropriate for developmental age) Does the patient have difficulty walking or climbing stairs?: No Weakness of Legs: None Weakness of Arms/Hands: None  Permission Sought/Granted Permission sought to share information with : Case Manager, Family Supports Permission granted to share information with : Yes, Verbal Permission Granted              Emotional Assessment Appearance:: Appears stated age Attitude/Demeanor/Rapport: Engaged, Gracious Affect (typically observed): Accepting, Appropriate, Calm, Hopeful Orientation: : Oriented to Self, Oriented to Place, Oriented to  Time, Oriented to Situation Alcohol / Substance Use: Illicit Drugs Psych Involvement: No (comment)  Admission diagnosis:  Hypoglycemia [E16.2] Nausea & vomiting [R11.2] Nausea and vomiting, unspecified vomiting type [R11.2] Patient Active Problem List   Diagnosis Date Noted   Hypoglycemia 08/27/2021   Acute metabolic encephalopathy 08/27/2021   Gastritis 05/14/2020   Uncontrolled type 1 diabetes mellitus with ketoacidosis  without coma, with long-term current use of insulin (HCC) 05/10/2020   SIRS (systemic inflammatory response syndrome) (HCC) 05/10/2020   Nicotine dependence, cigarettes, uncomplicated 05/10/2020    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   Hyperglycemia 04/27/2020   History of delayed wound healing 04/16/2020   Hemoglobin A1c less than 7.0% 04/16/2020   Allergies 04/16/2020   Rash and nonspecific skin eruption 11/23/2019   Spider bite 11/18/2019   Wound of left leg 11/18/2019   Intractable nausea and vomiting 05/04/2019   DKA, type 1 (HCC) 05/04/2019   DKA (diabetic ketoacidosis) (HCC) 05/04/2019   Nausea 12/02/2017   Tobacco use disorder 03/30/2017   Type 1 diabetes mellitus (HCC) 03/30/2017   Leukocytosis 03/30/2017   PCP:  Barbette Merino, NP Pharmacy:   [facility] [address] [place] [address] Phone: [phone] Fax: [phone]  [facility] [address] [place] [address] Phone: [phone] Fax: [phone]     Social Determinants of Health (SDOH) Interventions    Readmission Risk Interventions No flowsheet data found.

## 2021-11-17 LAB — COMPREHENSIVE METABOLIC PANEL
ALT: 18 U/L (ref 0–44)
AST: 24 U/L (ref 15–41)
Albumin: 3.4 g/dL — ABNORMAL LOW (ref 3.5–5.0)
Alkaline Phosphatase: 49 U/L (ref 38–126)
Anion gap: 8 (ref 5–15)
BUN: 12 mg/dL (ref 6–20)
CO2: 28 mmol/L (ref 22–32)
Calcium: 8.7 mg/dL — ABNORMAL LOW (ref 8.9–10.3)
Chloride: 103 mmol/L (ref 98–111)
Creatinine, Ser: 1.14 mg/dL (ref 0.61–1.24)
GFR, Estimated: >60 mL/min (ref >60)
Glucose, Bld: 138 mg/dL — ABNORMAL HIGH (ref 70–99)
Potassium: 3.7 mmol/L (ref 3.5–5.1)
Sodium: 139 mmol/L (ref 135–145)
Total Bilirubin: 0.9 mg/dL (ref 0.3–1.2)
Total Protein: 5.8 g/dL — ABNORMAL LOW (ref 6.5–8.1)

## 2021-11-17 LAB — CBC WITH DIFFERENTIAL/PLATELET
Abs Immature Granulocytes: 0.01 10*3/uL (ref 0.00–0.07)
Basophils Absolute: 0 10*3/uL (ref 0.0–0.1)
Basophils Relative: 0 %
Eosinophils Absolute: 0.1 10*3/uL (ref 0.0–0.5)
Eosinophils Relative: 2 %
HCT: 38.2 % — ABNORMAL LOW (ref 39.0–52.0)
Hemoglobin: 12.9 g/dL — ABNORMAL LOW (ref 13.0–17.0)
Immature Granulocytes: 0 %
Lymphocytes Relative: 24 %
Lymphs Abs: 1.9 10*3/uL (ref 0.7–4.0)
MCH: 31.3 pg (ref 26.0–34.0)
MCHC: 33.8 g/dL (ref 30.0–36.0)
MCV: 92.7 fL (ref 80.0–100.0)
Monocytes Absolute: 0.6 10*3/uL (ref 0.1–1.0)
Monocytes Relative: 8 %
Neutro Abs: 5.1 10*3/uL (ref 1.7–7.7)
Neutrophils Relative %: 66 %
Platelets: 193 10*3/uL (ref 150–400)
RBC: 4.12 MIL/uL — ABNORMAL LOW (ref 4.22–5.81)
RDW: 12 % (ref 11.5–15.5)
WBC: 7.7 10*3/uL (ref 4.0–10.5)
nRBC: 0 % (ref 0.0–0.2)

## 2021-11-17 LAB — GLUCOSE, CAPILLARY
Glucose-Capillary: 133 mg/dL — ABNORMAL HIGH (ref 70–99)
Glucose-Capillary: 59 mg/dL — ABNORMAL LOW (ref 70–99)
Glucose-Capillary: 84 mg/dL (ref 70–99)

## 2021-11-17 LAB — BRAIN NATRIURETIC PEPTIDE: B Natriuretic Peptide: 41.9 pg/mL (ref 0.0–100.0)

## 2021-11-17 LAB — MAGNESIUM: Magnesium: 1.8 mg/dL (ref 1.7–2.4)

## 2021-11-17 MED ORDER — INSULIN GLARGINE-YFGN 100 UNIT/ML ~~LOC~~ SOLN
12.0000 [IU] | Freq: Two times a day (BID) | SUBCUTANEOUS | Status: DC
  Filled 2021-11-17 (×2): qty 0.12

## 2021-11-17 MED ORDER — LISINOPRIL 5 MG PO TABS: 5.0000 mg | ORAL_TABLET | Freq: Every day | ORAL | 0 refills | Status: DC

## 2021-11-17 MED ORDER — INSULIN GLARGINE 100 UNIT/ML ~~LOC~~ SOLN: 28.0000 [IU] | Freq: Every day | SUBCUTANEOUS | 12 refills | Status: DC

## 2021-11-17 MED ORDER — ONDANSETRON HCL 4 MG PO TABS: 4.0000 mg | ORAL_TABLET | Freq: Three times a day (TID) | ORAL | 0 refills | Status: DC | PRN

## 2021-11-17 MED ORDER — INSULIN ASPART 100 UNIT/ML IJ SOLN: 0.0000 [IU] | Freq: Three times a day (TID) | INTRAMUSCULAR | Status: DC

## 2021-11-17 MED ORDER — PANTOPRAZOLE SODIUM 40 MG PO TBEC: 40.0000 mg | DELAYED_RELEASE_TABLET | Freq: Every day | ORAL | 0 refills | Status: DC

## 2021-11-17 NOTE — Discharge Summary (Signed)
Zachary Vazquez TGY:[id number] DOB: 12/01/1985 DOA: 11/14/2021  PCP: Vevelyn Francois, NP  Admit date: 11/14/2021  Discharge date: 11/17/2021  Admitted From: Home   Disposition:  Home   Recommendations for Outpatient Follow-up:   Follow up with PCP in 1-2 weeks  PCP Please obtain BMP/CBC, 2 view CXR in 1week,  (see Discharge instructions)   PCP Please follow up on the following pending results:    Home Health: None   Equipment/Devices: None  Consultations: None  Discharge Condition: Stable    CODE STATUS: Full    Diet Recommendation: Low Carb  Diet Order             DIET SOFT Room service appropriate? Yes; Fluid consistency: Thin  Diet effective now                    Chief Complaint  Patient presents with   Hypoglycemia     Brief history of present illness from the day of admission and additional interim summary    36 year old male with history of DM type II, gastroparesis, presented to the ER with chief complaints of nausea vomiting and low blood sugars.  He was recently in the ER for a similar problem but signed out AMA before he could be admitted.  In the ER he was found to be dehydrated, hyperglycemic, had persistent nausea vomiting and was admitted for further care.                                                                 Hospital Course    1.  Persistent nausea vomiting likely due to underlying diabetic gastroparesis.  Supportive care, nonacute CT scan abdomen pelvis, premeal Reglan scheduled, soft diet, as needed Zofran and Phenergan.  Improving with supportive care and hydration which will be continued outpatient GI follow-up post discharge for gastroparesis work-up.  And Zofran given upon discharge.  Currently symptom-free.   2.  DM type I with hypoglycemia upon presentation.   Due to nausea vomiting causing reduced oral intake, labile A1c and outpatient control, he is now stable tolerating oral diet, adjusted Lantus and sliding scale, requested to check CBGs q. [facility] S maintain a logbook and follow-up with PCP within a week.   3.  Leukocytosis likely stress-induced.  Resolved after supportive care CT abdomen unremarkable..   4.  AKI.  resolved after hydration.  5.  Hypertension.  Placed on ACE inhibitor low-dose PCP to monitor.   Discharge diagnosis     Principal Problem:   Hypoglycemia Active Problems:   Acute metabolic encephalopathy    Discharge instructions    Discharge Instructions     Discharge instructions   Complete by: As directed    Follow with Primary MD Vevelyn Francois, NP in 7 days, you need to  see a gastroenterologist in 1 to 2 weeks for a gastric emptying study.  Get CBC, CMP, 2 view Chest X ray -  checked next visit within 1 week by Primary MD    Activity: As tolerated with Full fall precautions use walker/cane & assistance as needed  Disposition Home    Diet: Low carbohydrate diet  Accuchecks 4 times/day, Once in AM empty stomach and then before each meal. Log in all results and show them to your Prim.MD in 3 days. If any glucose reading is under 80 or above 300 call your Prim MD immidiately. Follow Low glucose instructions for glucose under 80 as instructed.   Special Instructions: If you have smoked or chewed Tobacco  in the last 2 yrs please stop smoking, stop any regular Alcohol  and or any Recreational drug use.  On your next visit with your primary care physician please Get Medicines reviewed and adjusted.  Please request your Prim.MD to go over all Hospital Tests and Procedure/Radiological results at the follow up, please get all Hospital records sent to your Prim MD by signing hospital release before you go home.  If you experience worsening of your admission symptoms, develop shortness of breath, life threatening  emergency, suicidal or homicidal thoughts you must seek medical attention immediately by calling 911 or calling your MD immediately  if symptoms less severe.  You Must read complete instructions/literature along with all the possible adverse reactions/side effects for all the Medicines you take and that have been prescribed to you. Take any new Medicines after you have completely understood and accpet all the possible adverse reactions/side effects.   Increase activity slowly   Complete by: As directed        Discharge Medications   Allergies as of 11/17/2021       Reactions   Clindamycin/lincomycin Anaphylaxis   Bactrim [sulfamethoxazole-trimethoprim] Swelling, Other (See Comments)   Eyes swell and bumps appear on/near the eyes         Medication List     STOP taking these medications    desonide 0.05 % lotion Commonly known as: DESOWEN   desonide 0.05 % ointment Commonly known as: DESOWEN   Dexcom G6 Receiver Devi   Dexcom G6 Sensor Misc       TAKE these medications    Blood Glucose Monitoring Suppl Kit by Does not apply route 3 (three) times daily.   insulin glargine 100 UNIT/ML injection Commonly known as: LANTUS Inject 0.28 mLs (28 Units total) into the skin at bedtime. What changed: how much to take   lisinopril 5 MG tablet Commonly known as: ZESTRIL Take 1 tablet (5 mg total) by mouth daily.   NovoLOG 100 UNIT/ML injection Generic drug: insulin aspart Use as directed per sliding scale What changed:  how much to take how to take this when to take this additional instructions   ondansetron 4 MG tablet Commonly known as: Zofran Take 1 tablet (4 mg total) by mouth every 8 (eight) hours as needed for nausea or vomiting.   pantoprazole 40 MG tablet Commonly known as: Protonix Take 1 tablet (40 mg total) by mouth daily. What changed:  how much to take when to take this         Follow-up Information     Vevelyn Francois, NP. Schedule an  appointment as soon as possible for a visit in 1 week(s).   Specialty: Adult Health Nurse Practitioner Contact information: [address] [place] [address] [phone]  Otis Brace, MD. Schedule an appointment as soon as possible for a visit in 1 week(s).   Specialty: Gastroenterology Why: Underlying gastroparesis needs confirmatory gastric emptying study Contact information: [address] [place] [address] [phone]                 Major procedures and Radiology Reports - PLEASE review detailed and final reports thoroughly  -        CT ABDOMEN PELVIS WO CONTRAST  Result Date: 11/15/2021 CLINICAL DATA:  Abdominal pain, acute, nonlocalized.  Hypoglycemia. EXAM: CT ABDOMEN AND PELVIS WITHOUT CONTRAST TECHNIQUE: Multidetector CT imaging of the abdomen and pelvis was performed following the standard protocol without IV contrast. RADIATION DOSE REDUCTION: This exam was performed according to the departmental dose-optimization program which includes automated exposure control, adjustment of the mA and/or kV according to patient size and/or use of iterative reconstruction technique. COMPARISON:  05/11/2020 FINDINGS: Lower chest: Normal Hepatobiliary: Redemonstration of coarse calcifications at the right dome of the liver, favored to represent calcifications within a hemangioma. These are not changed and would not be clinically significant. Pancreas: No significant finding. Spleen: Normal Adrenals/Urinary Tract: Adrenal glands are normal. Kidneys are normal. No cyst, mass, stone or hydronephrosis. Stomach/Bowel: No abnormal bowel finding. No evidence of obstruction, ileus, inflammatory change or focal lesion. Vascular/Lymphatic: Aortic atherosclerosis, minimal. IVC is normal. No adenopathy. Reproductive: Normal Other: No free fluid or air. Musculoskeletal: Old minor superior endplate deformities at T10 and T11. No evidence of recent injury. These do  appear new since July of 2021 however. IMPRESSION: No acute CT finding to explain abdominal pain. Chronic calcifications at the right dome of the liver favored to represent calcifications within a hemangioma. Aortic atherosclerosis, mild. Minor superior endplate fractures at B16 and T11, new since July of 2021 but likely chronic and healed. Electronically Signed   By: Nelson Chimes M.D.   On: 11/15/2021 11:50   DG Chest 2 View  Result Date: 11/14/2021 CLINICAL DATA:  Syncope, aspiration EXAM: CHEST - 2 VIEW COMPARISON:  08/26/2021 FINDINGS: Frontal and lateral views of the chest demonstrate an unremarkable cardiac silhouette. No acute airspace disease, effusion, or pneumothorax. No acute bony abnormalities. IMPRESSION: 1. No acute intrathoracic process. Electronically Signed   By: Randa Ngo M.D.   On: 11/14/2021 22:46     Today   Subjective    Zachary Vazquez today has no headache,no chest abdominal pain,no new weakness tingling or numbness, feels much better wants to go home today.     Objective   Blood pressure 140/85, pulse 97, temperature 99.1 F (37.3 C), temperature source Oral, resp. rate 17, height 5' 11"  (1.803 m), weight 85 kg, SpO2 98 %.   Intake/Output Summary (Last 24 hours) at 11/17/2021 0824 Last data filed at 11/17/2021 0500 Gross per 24 hour  Intake 2522.95 ml  Output --  Net 2522.95 ml    Exam  Awake Alert, No new F.N deficits,    [place].AT,PERRAL Supple Neck,   Symmetrical Chest wall movement, Good air movement bilaterally, CTAB RRR,No Gallops,   +ve B.Sounds, Abd Soft, Non tender,  No Cyanosis, Clubbing or edema    Data Review   CBC w Diff:  Lab Results  Component Value Date   WBC 7.7 11/17/2021   HGB 12.9 (L) 11/17/2021   HGB 15.0 07/13/2021   HCT 38.2 (L) 11/17/2021   HCT 45.9 07/13/2021   PLT 193 11/17/2021   PLT 253 07/13/2021   LYMPHOPCT 24 11/17/2021   MONOPCT  8 11/17/2021   EOSPCT 2 11/17/2021   BASOPCT 0 11/17/2021    CMP:  Lab Results   Component Value Date   NA 139 11/17/2021   NA 139 10/12/2021   K 3.7 11/17/2021   CL 103 11/17/2021   CO2 28 11/17/2021   BUN 12 11/17/2021   BUN 17 10/12/2021   CREATININE 1.14 11/17/2021   PROT 5.8 (L) 11/17/2021   PROT 7.5 10/12/2021   ALBUMIN 3.4 (L) 11/17/2021   ALBUMIN 4.9 10/12/2021   BILITOT 0.9 11/17/2021   BILITOT 0.6 10/12/2021   ALKPHOS 49 11/17/2021   AST 24 11/17/2021   ALT 18 11/17/2021  .   Total Time in preparing paper work, data evaluation and todays exam - 65 minutes  Lala Lund M.D on 11/17/2021 at 8:24 AM  [facility]

## 2021-11-17 NOTE — Discharge Instructions (Signed)
Follow with Primary MD Vevelyn Francois, NP in 7 days, you need to see a gastroenterologist in 1 to 2 weeks for a gastric emptying study.  Get CBC, CMP, 2 view Chest X ray -  checked next visit within 1 week by Primary MD    Activity: As tolerated with Full fall precautions use walker/cane & assistance as needed  Disposition Home    Diet: Low carbohydrate diet  Accuchecks 4 times/day, Once in AM empty stomach and then before each meal. Log in all results and show them to your Prim.MD in 3 days. If any glucose reading is under 80 or above 300 call your Prim MD immidiately. Follow Low glucose instructions for glucose under 80 as instructed.   Special Instructions: If you have smoked or chewed Tobacco  in the last 2 yrs please stop smoking, stop any regular Alcohol  and or any Recreational drug use.  On your next visit with your primary care physician please Get Medicines reviewed and adjusted.  Please request your Prim.MD to go over all Hospital Tests and Procedure/Radiological results at the follow up, please get all Hospital records sent to your Prim MD by signing hospital release before you go home.  If you experience worsening of your admission symptoms, develop shortness of breath, life threatening emergency, suicidal or homicidal thoughts you must seek medical attention immediately by calling 911 or calling your MD immediately  if symptoms less severe.  You Must read complete instructions/literature along with all the possible adverse reactions/side effects for all the Medicines you take and that have been prescribed to you. Take any new Medicines after you have completely understood and accpet all the possible adverse reactions/side effects.

## 2021-11-17 NOTE — Plan of Care (Signed)
  Problem: Education: Goal: Knowledge of General Education information will improve Description: Including pain rating scale, medication(s)/side effects and non-pharmacologic comfort measures Outcome: Adequate for Discharge   Problem: Education: Goal: Knowledge of General Education information will improve Description: Including pain rating scale, medication(s)/side effects and non-pharmacologic comfort measures Outcome: Adequate for Discharge   Problem: Health Behavior/Discharge Planning: Goal: Ability to manage health-related needs will improve Outcome: Adequate for Discharge   

## 2021-11-17 NOTE — Progress Notes (Signed)
DISCHARGE NOTE HOME Zachary Vazquez to be discharged Home per MD order. Discussed prescriptions and follow up appointments with the patient. Prescriptions given to patient; medication list explained in detail. Patient verbalized understanding.  Skin clean, dry and intact without evidence of skin break down, no evidence of skin tears noted. IV catheter discontinued intact. Site without signs and symptoms of complications. Dressing and pressure applied. Pt denies pain at the site currently. No complaints noted.  Patient free of lines, drains, and wounds.   An After Visit Summary (AVS) was printed and given to the patient. Patient escorted via wheelchair, and discharged home via private auto.  [place], Kem Kays, RN

## 2021-11-20 ENCOUNTER — Telehealth: Payer: Self-pay

## 2021-11-20 NOTE — Telephone Encounter (Signed)
Please call pt he has questions for you in regards to BP  Also if needs a free style libre monitor as a prescription!

## 2021-11-21 NOTE — Telephone Encounter (Signed)
Called pt and couldn't leave a vm, will call back later this afternoon

## 2021-12-14 ENCOUNTER — Other Ambulatory Visit: Payer: Self-pay

## 2021-12-14 ENCOUNTER — Other Ambulatory Visit: Payer: Self-pay | Admitting: Nurse Practitioner

## 2021-12-14 ENCOUNTER — Telehealth: Payer: Self-pay

## 2021-12-14 MED ORDER — INSULIN ASPART 100 UNIT/ML IJ SOLN
INTRAMUSCULAR | 0 refills | Status: DC
  Filled 2021-12-14: qty 10, 28d supply, fill #0
  Filled 2022-01-22: qty 10, 28d supply, fill #1

## 2021-12-14 MED ORDER — LISINOPRIL 5 MG PO TABS: 5.0000 mg | ORAL_TABLET | Freq: Every day | ORAL | 0 refills | Status: DC

## 2021-12-14 NOTE — Telephone Encounter (Signed)
The refill has been sent. Thanks  ° °

## 2021-12-14 NOTE — Telephone Encounter (Signed)
Patient called requesting Lisinopril and Novolog refill be sent to [facility]. Next appt 3/16.

## 2021-12-20 ENCOUNTER — Other Ambulatory Visit: Payer: Self-pay

## 2021-12-20 ENCOUNTER — Other Ambulatory Visit: Payer: Self-pay | Admitting: Nurse Practitioner

## 2021-12-24 ENCOUNTER — Other Ambulatory Visit: Payer: Self-pay

## 2021-12-24 ENCOUNTER — Telehealth: Payer: Self-pay

## 2021-12-24 DIAGNOSIS — E109 Type 1 diabetes mellitus without complications: Secondary | ICD-10-CM

## 2021-12-24 MED ORDER — LISINOPRIL 5 MG PO TABS
5.0000 mg | ORAL_TABLET | Freq: Every day | ORAL | 0 refills | Status: DC
  Filled 2021-12-24: qty 30, 30d supply, fill #0

## 2021-12-24 MED ORDER — LISINOPRIL 5 MG PO TABS: 5.0000 mg | ORAL_TABLET | Freq: Every day | ORAL | 0 refills | Status: DC

## 2021-12-24 NOTE — Telephone Encounter (Signed)
Pt called in and said he has called several times and is needing his BP med to be refilled    He needs someone to reach out so he knows why he hasn't receive it.

## 2021-12-24 NOTE — Telephone Encounter (Signed)
Spoke to the patient and advised him that it was printed out on 12/14/21 by accident and we thought it went through electronically. Pt rx refills were resent to the pharmacy we have on file for him. Pt is aware.

## 2021-12-25 ENCOUNTER — Other Ambulatory Visit: Payer: Self-pay

## 2022-01-10 ENCOUNTER — Other Ambulatory Visit: Payer: Self-pay

## 2022-01-10 ENCOUNTER — Encounter: Payer: Self-pay | Admitting: Nurse Practitioner

## 2022-01-10 ENCOUNTER — Ambulatory Visit (INDEPENDENT_AMBULATORY_CARE_PROVIDER_SITE_OTHER): Payer: Self-pay | Admitting: Nurse Practitioner

## 2022-01-10 VITALS — BP 137/98 | HR 85 | Temp 98.4°F | Ht 71.0 in | Wt 189.6 lb

## 2022-01-10 DIAGNOSIS — E109 Type 1 diabetes mellitus without complications: Secondary | ICD-10-CM

## 2022-01-10 DIAGNOSIS — I152 Hypertension secondary to endocrine disorders: Secondary | ICD-10-CM

## 2022-01-10 DIAGNOSIS — R7989 Other specified abnormal findings of blood chemistry: Secondary | ICD-10-CM

## 2022-01-10 LAB — POCT GLYCOSYLATED HEMOGLOBIN (HGB A1C)
HbA1c POC (<> result, manual entry): 7.8 % (ref 4.0–5.6)
HbA1c, POC (controlled diabetic range): 7.8 % — AB (ref 0.0–7.0)
HbA1c, POC (prediabetic range): 7.8 % — AB (ref 5.7–6.4)
Hemoglobin A1C: 7.8 % — AB (ref 4.0–5.6)

## 2022-01-10 MED ORDER — FREESTYLE LIBRE 2 SENSOR MISC: 1.0000 | 5 refills | Status: AC

## 2022-01-10 MED ORDER — LISINOPRIL 10 MG PO TABS
10.0000 mg | ORAL_TABLET | Freq: Every day | ORAL | 1 refills | Status: DC
  Filled 2022-01-10 – 2022-01-22 (×2): qty 30, 30d supply, fill #0
  Filled 2022-03-01: qty 30, 30d supply, fill #1
  Filled 2022-04-02: qty 30, 30d supply, fill #2
  Filled 2022-05-08: qty 30, 30d supply, fill #3
  Filled 2022-06-07: qty 60, 60d supply, fill #4

## 2022-01-10 MED ORDER — FREESTYLE LIBRE 2 READER DEVI: 1.0000 "application " | 3 refills | Status: DC

## 2022-01-10 NOTE — Progress Notes (Signed)
? ?[place] ?[place], [place]  [address] ?Phone:  [phone]   Fax:  [phone] ? ? ?Established Patient Office Visit ? ?Subjective:  ?Patient ID: Zachary Vazquez, male    DOB: 1986/04/26  Age: 36 y.o. MRN: [id number] ? ?CC:  ?Chief Complaint  ?Patient presents with  ? Follow-up  ?  Patient is here today for his 3 month follow up visit and to discuss his blood pressure being elevated back in January and was put on lisinopril medication to help.  ? ? ?HPI ?Zachary Vazquez presents for follow up. She  has a past medical history of History of delayed wound healing (10/2019), Rash and nonspecific skin eruption (10/2019), Type 1 diabetes mellitus ([place]), Vitamin D deficiency (09/2020), and Wound of lower extremity, left, sequela (10/2019).  ? ?Zachary Vazquez is in today for diabetes follow up. He is following up for his hospital admission. He reports using too much insulin; hypoglycemia and coma . He is feeling better now. The prescribed treatment is lantus along with Novolog. The reported use of treatment is  consistent with prescribed. There is reported side effects from the treatment. Home glucose monitoring is not being done.  ?Denies fever, chills, headache, dizziness, visual changes, polydipsia,  polyphagia shortness of breath, dyspnea on exertion, chest pain, abdominal pain, nausea, vomiting, polyuria, constipation, diarrhea,  any edema, numbness, tingling, burning of hand or feet.  ? ? ?Past Medical History:  ?Diagnosis Date  ? History of delayed wound healing 10/2019  ? Rash and nonspecific skin eruption 10/2019  ? Type 1 diabetes mellitus ([place])   ? Vitamin D deficiency 09/2020  ? Wound of lower extremity, left, sequela 10/2019  ? ? ?Past Surgical History:  ?Procedure Laterality Date  ? TONSILLECTOMY    ? ? ?Family History  ?Problem Relation Age of Onset  ? Hypertension Mother   ? Diabetes Mellitus I Brother   ? ? ?Social History  ? ?Socioeconomic History  ? Marital  status: Single  ?  Spouse name: Not on file  ? Number of children: Not on file  ? Years of education: Not on file  ? Highest education level: Not on file  ?Occupational History  ? Not on file  ?Tobacco Use  ? Smoking status: Every Day  ?  Packs/day: 1.00  ?  Years: 15.00  ?  Pack years: 15.00  ?  Types: Cigarettes  ? Smokeless tobacco: Never  ?Vaping Use  ? Vaping Use: Never used  ?Substance and Sexual Activity  ? Alcohol use: Yes  ?  Comment: occ  ? Drug use: Not Currently  ?  Types: Marijuana  ?  Comment: occ  ? Sexual activity: Yes  ?  Birth control/protection: None  ?Other Topics Concern  ? Not on file  ?Social History Narrative  ? Not on file  ? ?Social Determinants of Health  ? ?Financial Resource Strain: Not on file  ?Food Insecurity: Not on file  ?Transportation Needs: Not on file  ?Physical Activity: Not on file  ?Stress: Not on file  ?Social Connections: Not on file  ?Intimate Partner Violence: Not on file  ? ? ?Outpatient Medications Prior to Visit  ?Medication Sig Dispense Refill  ? Blood Glucose Monitoring Suppl KIT by Does not apply route 3 (three) times daily.    ? insulin aspart (NOVOLOG) 100 UNIT/ML injection Use as directed per sliding scale 230 mL 0  ? insulin glargine (LANTUS) 100 UNIT/ML injection Inject 0.28 mLs (28  Units total) into the skin at bedtime. 10 mL 12  ? ondansetron (ZOFRAN) 4 MG tablet Take 1 tablet (4 mg total) by mouth every 8 (eight) hours as needed for nausea or vomiting. 20 tablet 0  ? lisinopril (ZESTRIL) 5 MG tablet Take 1 tablet (5 mg total) by mouth daily. 30 tablet 0  ? pantoprazole (PROTONIX) 40 MG tablet Take 1 tablet (40 mg total) by mouth daily. 30 tablet 0  ? ?No facility-administered medications prior to visit.  ? ? ?Allergies  ?Allergen Reactions  ? Clindamycin/Lincomycin Anaphylaxis  ? Bactrim [Sulfamethoxazole-Trimethoprim] Swelling and Other (See Comments)  ?  Eyes swell and bumps appear on/near the eyes   ? ? ?ROS ?Review of Systems ? ?  ?Objective:  ?   ?Physical Exam ?Constitutional:   ?   Appearance: He is normal weight.  ?HENT:  ?   Head: Normocephalic and atraumatic.  ?   Nose: Nose normal.  ?Cardiovascular:  ?   Rate and Rhythm: Normal rate and regular rhythm.  ?   Pulses: Normal pulses.  ?   Heart sounds: Normal heart sounds.  ?Pulmonary:  ?   Effort: Pulmonary effort is normal.  ?   Breath sounds: Normal breath sounds.  ?Abdominal:  ?   Palpations: Abdomen is soft.  ?Musculoskeletal:     ?   General: Normal range of motion.  ?   Cervical back: Normal range of motion.  ?   Right lower leg: No edema.  ?   Left lower leg: No edema.  ?Skin: ?   General: Skin is warm.  ?   Capillary Refill: Capillary refill takes less than 2 seconds.  ?Neurological:  ?   General: No focal deficit present.  ?   Mental Status: He is alert.  ?Psychiatric:     ?   Mood and Affect: Mood normal.     ?   Behavior: Behavior normal.     ?   Thought Content: Thought content normal.     ?   Judgment: Judgment normal.  ? ? ?BP (!) 137/98   Pulse 85   Temp 98.4 ?F (36.9 ?C)   Ht _0  (1.803 m)   Wt 189 lb 9.6 oz (86 kg)   SpO2 99%   BMI 26.44 kg/m?  ?Wt Readings from Last 3 Encounters:  ?01/10/22 189 lb 9.6 oz (86 kg)  ?11/14/21 187 lb 6.3 oz (85 kg)  ?10/12/21 191 lb 3.2 oz (86.7 kg)  ? ? ? ?Health Maintenance Due  ?Topic Date Due  ? COVID-19 Vaccine (1) Never done  ? OPHTHALMOLOGY EXAM  Never done  ? Hepatitis C Screening  Never done  ? ? ?There are no preventive care reminders to display for this patient. ? ?Lab Results  ?Component Value Date  ? TSH 1.580 01/10/2022  ? ?Lab Results  ?Component Value Date  ? WBC 7.7 11/17/2021  ? HGB 12.9 (L) 11/17/2021  ? HCT 38.2 (L) 11/17/2021  ? MCV 92.7 11/17/2021  ? PLT 193 11/17/2021  ? ?Lab Results  ?Component Value Date  ? NA 134 01/10/2022  ? K 5.3 (H) 01/10/2022  ? CO2 28 11/17/2021  ? GLUCOSE 278 (H) 01/10/2022  ? BUN 24 (H) 01/10/2022  ? CREATININE 1.18 01/10/2022  ? BILITOT 0.6 01/10/2022  ? ALKPHOS 85 01/10/2022  ? AST 19 01/10/2022   ? ALT 18 11/17/2021  ? PROT 7.3 01/10/2022  ? ALBUMIN 4.9 01/10/2022  ? CALCIUM 9.7 01/10/2022  ? ANIONGAP 8 11/17/2021  ?  EGFR 83 01/10/2022  ? ?Lab Results  ?Component Value Date  ? CHOL 190 10/12/2021  ? ?Lab Results  ?Component Value Date  ? HDL 58 10/12/2021  ? ?Lab Results  ?Component Value Date  ? LDLCALC 121 (H) 10/12/2021  ? ?Lab Results  ?Component Value Date  ? TRIG 57 10/12/2021  ? ?Lab Results  ?Component Value Date  ? CHOLHDL 3.3 10/12/2021  ? ?Lab Results  ?Component Value Date  ? HGBA1C 7.8 (A) 01/10/2022  ? HGBA1C 7.8 01/10/2022  ? HGBA1C 7.8 (A) 01/10/2022  ? HGBA1C 7.8 (A) 01/10/2022  ? ? ?  ?Assessment & Plan:  ? ?Problem List Items Addressed This Visit   ? ?  ? Endocrine  ? Type 1 diabetes mellitus (H[facility]) - Primary ?Stable  ?Discussed hypoglycemia  ?Continue with current regimen.  No changes warranted. Good patient compliance. ?Discussed statin therapy  ? Relevant Medications  ? lisinopril (ZESTRIL) 10 MG tablet  ? Other Relevant Orders  ? HgB A1c (Completed)  ? Comp. Metabolic Panel (12) (Completed)  ? ?Other Visit Diagnoses   ? ? Hypertension due to endocrine disorder      ? Relevant Medications  ? lisinopril (ZESTRIL) 10 MG tablet  ? Elevated TSH      ? Relevant Orders  ? TSH (Completed)  ? ?  ? ? ?Meds ordered this encounter  ?Medications  ? Continuous Blood Gluc Receiver (FREESTYLE LIBRE 2 READER) DEVI  ?  Sig: 1 application. by Does not apply route every 14 (fourteen) days.  ?  Dispense:  1 each  ?  Refill:  3  ?  Order Specific Question:   Supervising Provider  ?  AnswerTresa Garter [[id number]]  ? Continuous Blood Gluc Sensor (FREESTYLE LIBRE 2 SENSOR) MISC  ?  Sig: 1 applicator by Does not apply route every 14 (fourteen) days.  ?  Dispense:  2 each  ?  Refill:  5  ?  Order Specific Question:   Supervising Provider  ?  AnswerTresa Garter [[id number]]  ? lisinopril (ZESTRIL) 10 MG tablet  ?  Sig: Take 1 tablet (10 mg total) by mouth daily.  ?  Dispense:  90 tablet  ?   Refill:  1  ?  Order Specific Question:   Supervising Provider  ?  AnswerTresa Garter [[id number]]  ? ? ?Follow-up: Return in about 3 months (around 04/12/2022).  ? ? ?Vevelyn Francois, NP ?

## 2022-01-10 NOTE — Patient Instructions (Signed)
Managing Your Hypertension Hypertension, also called high blood pressure, is when the force of the blood pressing against the walls of the arteries is too strong. Arteries are blood vessels that carry blood from your heart throughout your body. Hypertension forces the heart to work harder to pump blood and may cause the arteries to become narrow or stiff. Understanding blood pressure readings Your personal target blood pressure may vary depending on your medical conditions, your age, and other factors. A blood pressure reading includes a higher number over a lower number. Ideally, your blood pressure should be below 120/80. You should know that: The first, or top, number is called the systolic pressure. It is a measure of the pressure in your arteries as your heart beats. The second, or bottom number, is called the diastolic pressure. It is a measure of the pressure in your arteries as the heart relaxes. Blood pressure is classified into four stages. Based on your blood pressure reading, your health care provider may use the following stages to determine what type of treatment you need, if any. Systolic pressure and diastolic pressure are measured in a unit called mmHg. Normal Systolic pressure: below 120. Diastolic pressure: below 80. Elevated Systolic pressure: 120-129. Diastolic pressure: below 80. Hypertension stage 1 Systolic pressure: 130-139. Diastolic pressure: 80-89. Hypertension stage 2 Systolic pressure: 140 or above. Diastolic pressure: 90 or above. How can this condition affect me? Managing your hypertension is an important responsibility. Over time, hypertension can damage the arteries and decrease blood flow to important parts of the body, including the brain, heart, and kidneys. Having untreated or uncontrolled hypertension can lead to: A heart attack. A stroke. A weakened blood vessel (aneurysm). Heart failure. Kidney damage. Eye damage. Metabolic syndrome. Memory and  concentration problems. Vascular dementia. What actions can I take to manage this condition? Hypertension can be managed by making lifestyle changes and possibly by taking medicines. Your health care provider will help you make a plan to bring your blood pressure within a normal range. Nutrition  Eat a diet that is high in fiber and potassium, and low in salt (sodium), added sugar, and fat. An example eating plan is called the Dietary Approaches to Stop Hypertension (DASH) diet. To eat this way: Eat plenty of fresh fruits and vegetables. Try to fill one-half of your plate at each meal with fruits and vegetables. Eat whole grains, such as whole-wheat pasta, brown rice, or whole-grain bread. Fill about one-fourth of your plate with whole grains. Eat low-fat dairy products. Avoid fatty cuts of meat, processed or cured meats, and poultry with skin. Fill about one-fourth of your plate with lean proteins such as fish, chicken without skin, beans, eggs, and tofu. Avoid pre-made and processed foods. These tend to be higher in sodium, added sugar, and fat. Reduce your daily sodium intake. Most people with hypertension should eat less than 1,500 mg of sodium a day. Lifestyle  Work with your health care provider to maintain a healthy body weight or to lose weight. Ask what an ideal weight is for you. Get at least 30 minutes of exercise that causes your heart to beat faster (aerobic exercise) most days of the week. Activities may include walking, swimming, or biking. Include exercise to strengthen your muscles (resistance exercise), such as weight lifting, as part of your weekly exercise routine. Try to do these types of exercises for 30 minutes at least 3 days a week. Do not use any products that contain nicotine or tobacco, such as cigarettes, e-cigarettes,   and chewing tobacco. If you need help quitting, ask your health care provider. Control any long-term (chronic) conditions you have, such as high  cholesterol or diabetes. Identify your sources of stress and find ways to manage stress. This may include meditation, deep breathing, or making time for fun activities. Alcohol use Do not drink alcohol if: Your health care provider tells you not to drink. You are pregnant, may be pregnant, or are planning to become pregnant. If you drink alcohol: Limit how much you use to: 0-1 drink a day for women. 0-2 drinks a day for men. Be aware of how much alcohol is in your drink. In the [place] one drink equals one 12 oz bottle of beer (355 mL), one 5 oz glass of wine (148 mL), or one 1 oz glass of hard liquor (44 mL). Medicines Your health care provider may prescribe medicine if lifestyle changes are not enough to get your blood pressure under control and if: Your systolic blood pressure is 130 or higher. Your diastolic blood pressure is 80 or higher. Take medicines only as told by your health care provider. Follow the directions carefully. Blood pressure medicines must be taken as told by your health care provider. The medicine does not work as well when you skip doses. Skipping doses also puts you at risk for problems. Monitoring Before you monitor your blood pressure: Do not smoke, drink caffeinated beverages, or exercise within 30 minutes before taking a measurement. Use the bathroom and empty your bladder (urinate). Sit quietly for at least 5 minutes before taking measurements. Monitor your blood pressure at home as told by your health care provider. To do this: Sit with your back straight and supported. Place your feet flat on the floor. Do not cross your legs. Support your arm on a flat surface, such as a table. Make sure your upper arm is at heart level. Each time you measure, take two or three readings one minute apart and record the results. You may also need to have your blood pressure checked regularly by your health care provider. General information Talk with your health care  provider about your diet, exercise habits, and other lifestyle factors that may be contributing to hypertension. Review all the medicines you take with your health care provider because there may be side effects or interactions. Keep all visits as told by your health care provider. Your health care provider can help you create and adjust your plan for managing your high blood pressure. Where to find more information National Heart, Lung, and Blood Institute: www.nhlbi.nih.gov American Heart Association: www.heart.org Contact a health care provider if: You think you are having a reaction to medicines you have taken. You have repeated (recurrent) headaches. You feel dizzy. You have swelling in your ankles. You have trouble with your vision. Get help right away if: You develop a severe headache or confusion. You have unusual weakness or numbness, or you feel faint. You have severe pain in your chest or abdomen. You vomit repeatedly. You have trouble breathing. These symptoms may represent a serious problem that is an emergency. Do not wait to see if the symptoms will go away. Get medical help right away. Call your local emergency services (911 in the [place] Do not drive yourself to the hospital. Summary Hypertension is when the force of blood pumping through your arteries is too strong. If this condition is not controlled, it may put you at risk for serious complications. Your personal target blood pressure may vary depending on   your medical conditions, your age, and other factors. For most people, a normal blood pressure is less than 120/80. Hypertension is managed by lifestyle changes, medicines, or both. Lifestyle changes to help manage hypertension include losing weight, eating a healthy, low-sodium diet, exercising more, stopping smoking, and limiting alcohol. This information is not intended to replace advice given to you by your health care provider. Make sure you discuss any questions  you have with your health care provider. Document Revised: 11/01/2019 Document Reviewed: 09/14/2019 Elsevier Patient Education  2022 [employer] Inc.  

## 2022-01-11 LAB — COMP. METABOLIC PANEL (12)
AST: 19 IU/L (ref 0–40)
Albumin/Globulin Ratio: 2 (ref 1.2–2.2)
Albumin: 4.9 g/dL (ref 4.0–5.0)
Alkaline Phosphatase: 85 IU/L (ref 44–121)
BUN/Creatinine Ratio: 20 (ref 9–20)
BUN: 24 mg/dL — ABNORMAL HIGH (ref 6–20)
Bilirubin Total: 0.6 mg/dL (ref 0.0–1.2)
Calcium: 9.7 mg/dL (ref 8.7–10.2)
Chloride: 96 mmol/L (ref 96–106)
Creatinine, Ser: 1.18 mg/dL (ref 0.76–1.27)
Globulin, Total: 2.4 g/dL (ref 1.5–4.5)
Glucose: 278 mg/dL — ABNORMAL HIGH (ref 70–99)
Potassium: 5.3 mmol/L — ABNORMAL HIGH (ref 3.5–5.2)
Sodium: 134 mmol/L (ref 134–144)
Total Protein: 7.3 g/dL (ref 6.0–8.5)
eGFR: 83 mL/min/{1.73_m2} (ref >59)

## 2022-01-11 LAB — TSH: TSH: 1.58 u[IU]/mL (ref 0.450–4.500)

## 2022-01-17 ENCOUNTER — Other Ambulatory Visit: Payer: Self-pay

## 2022-01-22 ENCOUNTER — Other Ambulatory Visit: Payer: Self-pay

## 2022-01-22 ENCOUNTER — Other Ambulatory Visit: Payer: Self-pay | Admitting: Nurse Practitioner

## 2022-01-22 DIAGNOSIS — E109 Type 1 diabetes mellitus without complications: Secondary | ICD-10-CM

## 2022-01-24 ENCOUNTER — Other Ambulatory Visit: Payer: Self-pay

## 2022-01-25 ENCOUNTER — Other Ambulatory Visit: Payer: Self-pay | Admitting: Internal Medicine

## 2022-01-25 ENCOUNTER — Other Ambulatory Visit: Payer: Self-pay | Admitting: Nurse Practitioner

## 2022-01-25 ENCOUNTER — Other Ambulatory Visit: Payer: Self-pay

## 2022-01-25 DIAGNOSIS — E109 Type 1 diabetes mellitus without complications: Secondary | ICD-10-CM

## 2022-01-25 MED ORDER — INSULIN GLARGINE 100 UNIT/ML ~~LOC~~ SOLN
28.0000 [IU] | Freq: Every day | SUBCUTANEOUS | 12 refills | Status: DC
  Filled 2022-01-25: qty 10, 28d supply, fill #0
  Filled 2022-03-01: qty 10, 28d supply, fill #1
  Filled 2022-04-02: qty 10, 28d supply, fill #2

## 2022-01-25 MED ORDER — INSULIN ASPART 100 UNIT/ML IJ SOLN
INTRAMUSCULAR | 0 refills | Status: DC
Start: 2022-01-25 — End: 2022-07-15
  Filled 2022-03-01: qty 10, 30d supply, fill #0
  Filled 2022-04-02: qty 10, 28d supply, fill #1
  Filled 2022-05-08: qty 10, 28d supply, fill #2
  Filled 2022-06-07: qty 10, 28d supply, fill #3

## 2022-01-25 NOTE — Telephone Encounter (Signed)
Dr Hyman Hopes, ?This patient was told by Crystal to continue Lantus pen and pen needles but needs a refill and she didn't prescribe it originally and the original RX was cancelled. Patient needs meds as he is out and has just recovered from diabetic coma. ?Lorenza Burton asked if we can expedite. ?Thanks ?

## 2022-01-28 ENCOUNTER — Other Ambulatory Visit: Payer: Self-pay

## 2022-01-29 ENCOUNTER — Other Ambulatory Visit: Payer: Self-pay

## 2022-01-30 ENCOUNTER — Other Ambulatory Visit: Payer: Self-pay

## 2022-03-01 ENCOUNTER — Other Ambulatory Visit: Payer: Self-pay

## 2022-04-02 ENCOUNTER — Other Ambulatory Visit: Payer: Self-pay

## 2022-04-12 ENCOUNTER — Other Ambulatory Visit: Payer: Self-pay

## 2022-04-12 ENCOUNTER — Ambulatory Visit (INDEPENDENT_AMBULATORY_CARE_PROVIDER_SITE_OTHER): Payer: Self-pay | Admitting: Nurse Practitioner

## 2022-04-12 ENCOUNTER — Encounter: Payer: Self-pay | Admitting: Nurse Practitioner

## 2022-04-12 VITALS — BP 136/87 | HR 80 | Temp 97.3°F | Ht 71.0 in | Wt 181.6 lb

## 2022-04-12 DIAGNOSIS — E109 Type 1 diabetes mellitus without complications: Secondary | ICD-10-CM

## 2022-04-12 LAB — POCT GLYCOSYLATED HEMOGLOBIN (HGB A1C)
HbA1c POC (<> result, manual entry): 7.5 % (ref 4.0–5.6)
HbA1c, POC (controlled diabetic range): 7.5 % — AB (ref 0.0–7.0)
HbA1c, POC (prediabetic range): 7.5 % — AB (ref 5.7–6.4)
Hemoglobin A1C: 7.5 % — AB (ref 4.0–5.6)

## 2022-04-12 MED ORDER — INSULIN GLARGINE 100 UNIT/ML ~~LOC~~ SOLN
35.0000 [IU] | Freq: Every day | SUBCUTANEOUS | 12 refills | Status: DC
  Filled 2022-04-12 – 2022-05-08 (×2): qty 10, 28d supply, fill #0
  Filled 2022-06-07: qty 10, 28d supply, fill #1
  Filled 2022-08-01: qty 10, 28d supply, fill #2
  Filled 2022-09-10: qty 10, 28d supply, fill #3
  Filled 2022-10-14: qty 10, 28d supply, fill #4
  Filled 2022-11-14: qty 10, 28d supply, fill #5
  Filled 2022-12-12: qty 10, 28d supply, fill #6
  Filled 2023-01-28: qty 10, 28d supply, fill #7
  Filled 2023-03-14: qty 10, 28d supply, fill #8

## 2022-04-12 MED ORDER — INSULIN GLARGINE 100 UNIT/ML ~~LOC~~ SOLN
40.0000 [IU] | Freq: Every day | SUBCUTANEOUS | 12 refills | Status: DC
Start: 2022-04-12 — End: 2022-04-12
  Filled 2022-04-12: qty 10, 25d supply, fill #0

## 2022-04-12 NOTE — Patient Instructions (Signed)
You were seen today in the [facility] for reevaluation of DM. Labs were collected, results will be available via MyChart or, if abnormal, you will be contacted by clinic staff. You were prescribed medications, please take as directed. Please follow up in 3 mths for reevaluation of DM

## 2022-04-12 NOTE — Progress Notes (Unsigned)
[place], [place]  [address] Phone:  [phone]   Fax:  [phone] Subjective:   Patient ID: Zachary Vazquez, male    DOB: 01-Dec-1985, 37 y.o.   MRN: [id number]  Chief Complaint  Patient presents with   Follow-up    Pt is here for 3 months DM follow up visit.   HPI Zachary Vazquez 36 y.o. male  has a past medical history of History of delayed wound healing (10/2019), Rash and nonspecific skin eruption (10/2019), Type 1 diabetes mellitus ([place]), Vitamin D deficiency (09/2020), and Wound of lower extremity, left, sequela (10/2019). To the [facility] for three month follow up for DM.  Diabetes Mellitus: Patient presents for follow up of diabetes. Symptoms: none. Denies any symptoms. Patient denies none.  Evaluation to date has been included: hemoglobin A1C. Home sugars range unknown. Treatment to date: no recent interventions. Denies any other concerns today.   Denies any fatigue, chest pain, shortness of breath, HA or dizziness. Denies any blurred vision, numbness or tingling.  Past Medical History:  Diagnosis Date   History of delayed wound healing 10/2019   Rash and nonspecific skin eruption 10/2019   Type 1 diabetes mellitus ([place])    Vitamin D deficiency 09/2020   Wound of lower extremity, left, sequela 10/2019    Past Surgical History:  Procedure Laterality Date   TONSILLECTOMY      Family History  Problem Relation Age of Onset   Hypertension Mother    Diabetes Mellitus I Brother     Social History   Socioeconomic History   Marital status: Single    Spouse name: Not on file   Number of children: Not on file   Years of education: Not on file   Highest education level: Not on file  Occupational History   Not on file  Tobacco Use   Smoking status: Every Day    Packs/day: 1.00    Years: 15.00    Total pack years: 15.00    Types: Cigarettes   Smokeless tobacco: Never  Vaping Use   Vaping Use: Never used   Substance and Sexual Activity   Alcohol use: Yes    Comment: occ   Drug use: Not Currently    Types: Marijuana    Comment: occ   Sexual activity: Yes    Birth control/protection: None  Other Topics Concern   Not on file  Social History Narrative   Not on file   Social Determinants of Health   Financial Resource Strain: Not on file  Food Insecurity: Not on file  Transportation Needs: Not on file  Physical Activity: Not on file  Stress: Not on file  Social Connections: Not on file  Intimate Partner Violence: Not on file    Outpatient Medications Prior to Visit  Medication Sig Dispense Refill   Blood Glucose Monitoring Suppl KIT by Does not apply route 3 (three) times daily.     insulin aspart (NOVOLOG) 100 UNIT/ML injection Use as directed per sliding scale 230 mL 0   lisinopril (ZESTRIL) 10 MG tablet Take 1 tablet (10 mg total) by mouth daily. 90 tablet 1   insulin glargine (LANTUS) 100 UNIT/ML injection Inject 0.28 mLs (28 Units total) into the skin at bedtime. 10 mL 12   Continuous Blood Gluc Receiver (FREESTYLE LIBRE 2 READER) DEVI 1 application. by Does not apply route every 14 (fourteen) days. (Patient not taking: Reported on 04/12/2022) 1 each 3   ondansetron (  ZOFRAN) 4 MG tablet Take 1 tablet (4 mg total) by mouth every 8 (eight) hours as needed for nausea or vomiting. (Patient not taking: Reported on 04/12/2022) 20 tablet 0   pantoprazole (PROTONIX) 40 MG tablet Take 1 tablet (40 mg total) by mouth daily. 30 tablet 0   No facility-administered medications prior to visit.    Allergies  Allergen Reactions   Clindamycin/Lincomycin Anaphylaxis   Bactrim [Sulfamethoxazole-Trimethoprim] Swelling and Other (See Comments)    Eyes swell and bumps appear on/near the eyes     Review of Systems  Constitutional:  Negative for chills, fever and malaise/fatigue.  Respiratory:  Negative for cough and shortness of breath.   Cardiovascular:  Negative for chest pain, palpitations  and leg swelling.  Gastrointestinal:  Negative for abdominal pain, blood in stool, constipation, diarrhea, nausea and vomiting.  Musculoskeletal: Negative.   Skin: Negative.   Neurological: Negative.   Psychiatric/Behavioral:  Negative for depression. The patient is not nervous/anxious.   All other systems reviewed and are negative.      Objective:    Physical Exam Constitutional:      General: He is not in acute distress.    Appearance: Normal appearance. He is normal weight.  HENT:     Head: Normocephalic.  Neck:     Vascular: No carotid bruit.  Cardiovascular:     Rate and Rhythm: Normal rate and regular rhythm.     Pulses: Normal pulses.     Heart sounds: Normal heart sounds.     Comments: No obvious peripheral edema Pulmonary:     Effort: Pulmonary effort is normal.     Breath sounds: Normal breath sounds.  Musculoskeletal:        General: No swelling, tenderness, deformity or signs of injury. Normal range of motion.     Cervical back: Normal range of motion and neck supple. No rigidity or tenderness.     Right lower leg: No edema.     Left lower leg: No edema.  Lymphadenopathy:     Cervical: No cervical adenopathy.  Skin:    General: Skin is warm and dry.     Capillary Refill: Capillary refill takes less than 2 seconds.  Neurological:     General: No focal deficit present.     Mental Status: He is alert and oriented to person, place, and time.  Psychiatric:        Mood and Affect: Mood normal.        Behavior: Behavior normal.        Thought Content: Thought content normal.        Judgment: Judgment normal.     BP 136/87 (BP Location: Left Arm, Patient Position: Sitting, Cuff Size: Normal)   Pulse 80   Temp (!) 97.3 F (36.3 C)   Ht 5' 11"  (1.803 m)   Wt 181 lb 9.6 oz (82.4 kg)   SpO2 100%   BMI 25.33 kg/m  Wt Readings from Last 3 Encounters:  04/12/22 181 lb 9.6 oz (82.4 kg)  01/10/22 189 lb 9.6 oz (86 kg)  11/14/21 187 lb 6.3 oz (85 kg)     Immunization History  Administered Date(s) Administered   Tdap 12/15/2019    Diabetic Foot Exam - Simple   No data filed     Lab Results  Component Value Date   TSH 1.580 01/10/2022   Lab Results  Component Value Date   WBC 7.7 11/17/2021   HGB 12.9 (L) 11/17/2021   HCT 38.2 (L) 11/17/2021  MCV 92.7 11/17/2021   PLT 193 11/17/2021   Lab Results  Component Value Date   NA 134 01/10/2022   K 5.3 (H) 01/10/2022   CO2 28 11/17/2021   GLUCOSE 278 (H) 01/10/2022   BUN 24 (H) 01/10/2022   CREATININE 1.18 01/10/2022   BILITOT 0.6 01/10/2022   ALKPHOS 85 01/10/2022   AST 19 01/10/2022   ALT 18 11/17/2021   PROT 7.3 01/10/2022   ALBUMIN 4.9 01/10/2022   CALCIUM 9.7 01/10/2022   ANIONGAP 8 11/17/2021   EGFR 83 01/10/2022   Lab Results  Component Value Date   CHOL 190 10/12/2021   CHOL 195 10/11/2020   Lab Results  Component Value Date   HDL 58 10/12/2021   HDL 42 10/11/2020   Lab Results  Component Value Date   LDLCALC 121 (H) 10/12/2021   LDLCALC 139 (H) 10/11/2020   Lab Results  Component Value Date   TRIG 57 10/12/2021   TRIG 74 10/11/2020   Lab Results  Component Value Date   CHOLHDL 3.3 10/12/2021   CHOLHDL 4.6 10/11/2020   Lab Results  Component Value Date   HGBA1C 7.5 (A) 04/12/2022   HGBA1C 7.5 04/12/2022   HGBA1C 7.5 (A) 04/12/2022   HGBA1C 7.5 (A) 04/12/2022       Assessment & Plan:   Problem List Items Addressed This Visit       Endocrine   Type 1 diabetes mellitus ([place]) - Primary   Relevant Medications   insulin glargine (LANTUS) 100 UNIT/ML injection, increased to 35 units   Other Relevant Orders   POCT glycosylated hemoglobin (Hb A1C) (Completed):7.5, not at goal, but improved since last visit  Encouraged continued diet and exercise efforts  Encouraged continued compliance with medication    Follow up in 3 mths for reevaluation of DM, sooner as needed     I have discontinued Erlene Quan D. Uno's insulin glargine. I  have also changed his insulin glargine. Additionally, I am having him maintain his Blood Glucose Monitoring Suppl, pantoprazole, ondansetron, FreeStyle Libre 2 Reader, lisinopril, and insulin aspart.  Meds ordered this encounter  Medications   DISCONTD: insulin glargine (LANTUS) 100 UNIT/ML injection    Sig: Inject 0.4 mLs (40 Units total) into the skin at bedtime.    Dispense:  10 mL    Refill:  12   insulin glargine (LANTUS) 100 UNIT/ML injection    Sig: Inject 0.35 mLs (35 Units total) into the skin once nightly at bedtime.    Dispense:  10 mL    Refill:  12     Teena Dunk, NP

## 2022-05-08 ENCOUNTER — Other Ambulatory Visit: Payer: Self-pay

## 2022-05-09 ENCOUNTER — Other Ambulatory Visit: Payer: Self-pay

## 2022-06-05 IMAGING — CT CT ABD-PELV W/O CM
2 of 4 series · 17 of 46 positions shown, 19 images · non-contrast
Comparison: 05/11/2020

CLINICAL DATA: Abdominal pain, acute, nonlocalized.  Hypoglycemia.



[Series 3: ap without · axial · non-contrast · 0.74mm/px · z∈[+1134,+1534]mm · 14 of 92 slices shown, 16 images]
[im 6/92  soft-tissue]
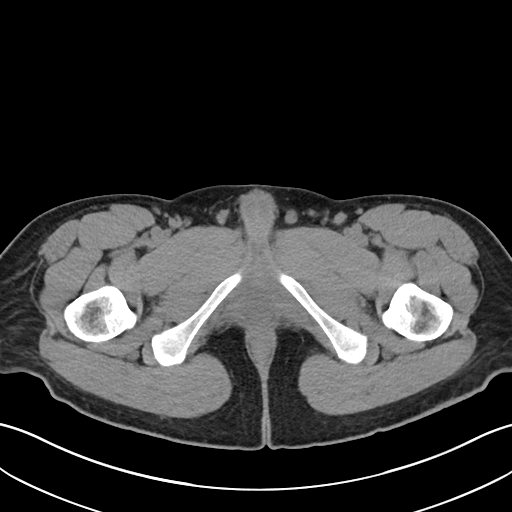
[im 6/92  bone]
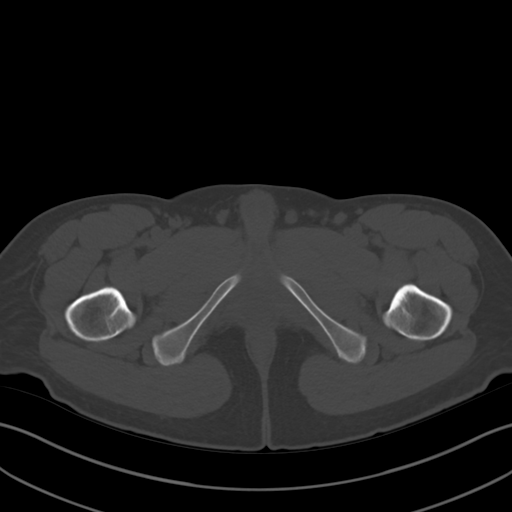
[im 11/92  soft-tissue]
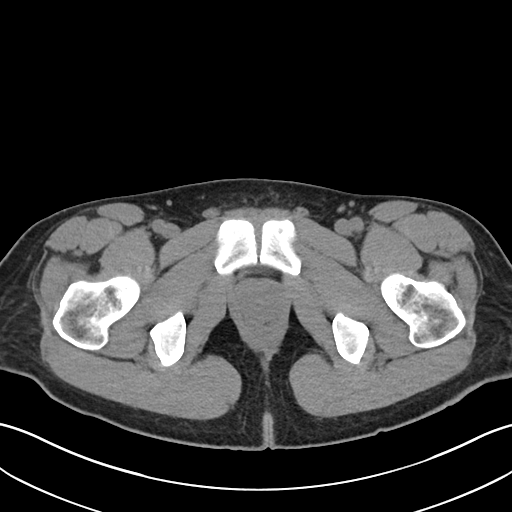
[im 21/92  soft-tissue]
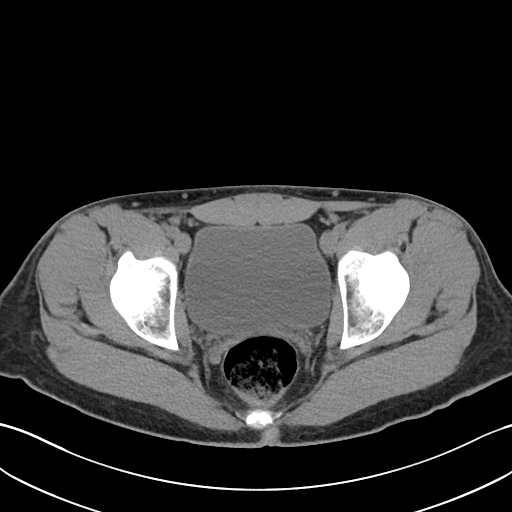
[im 26/92  soft-tissue]
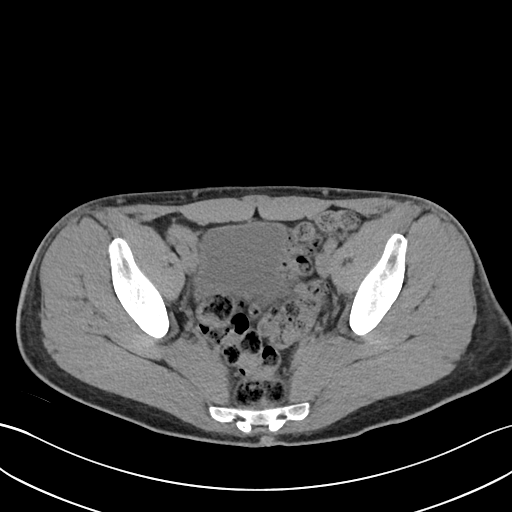
[im 31/92  soft-tissue]
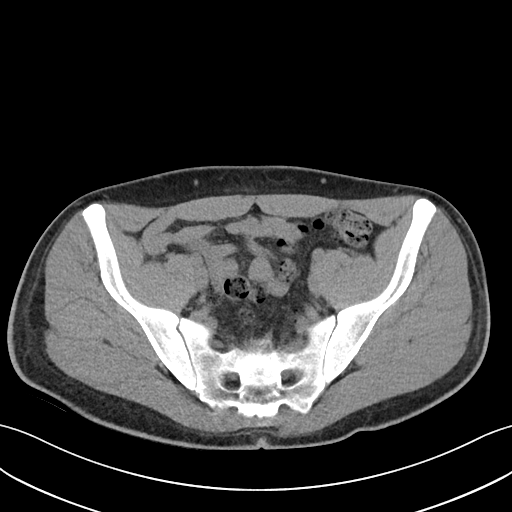
[im 36/92  soft-tissue]
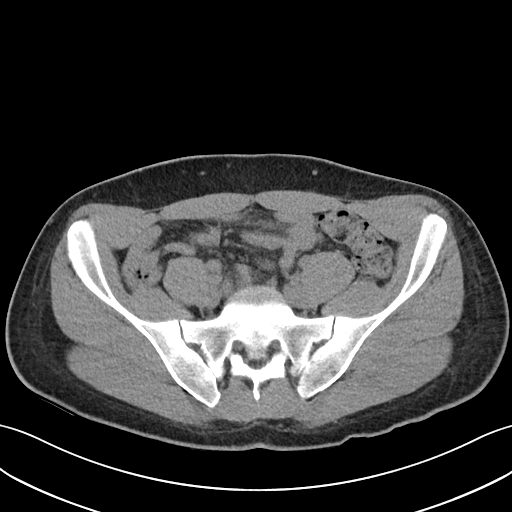
[im 41/92  soft-tissue]
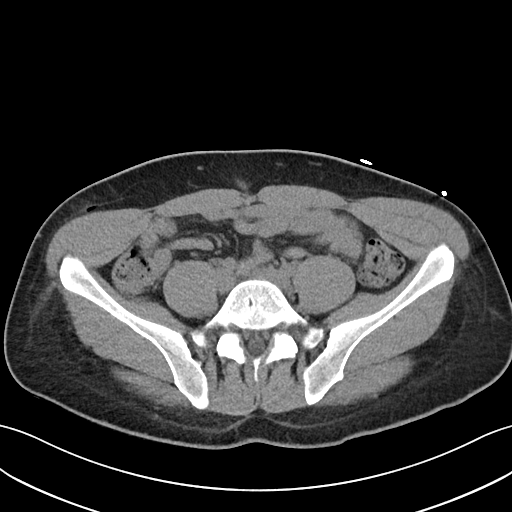
[im 51/92  soft-tissue]
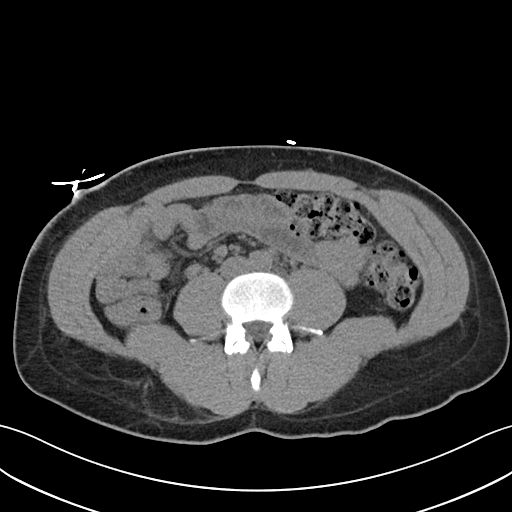
[im 56/92  soft-tissue]
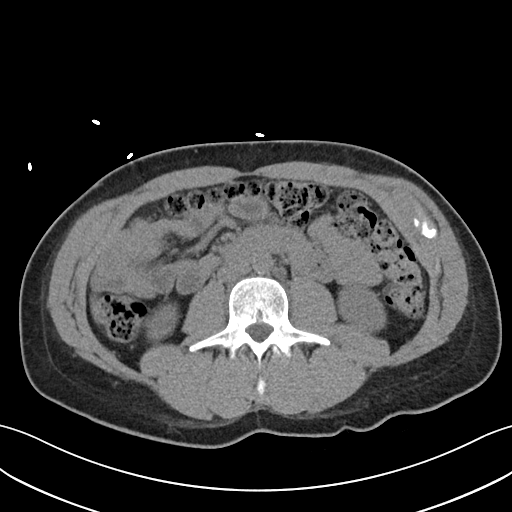
[im 56/92  bone]
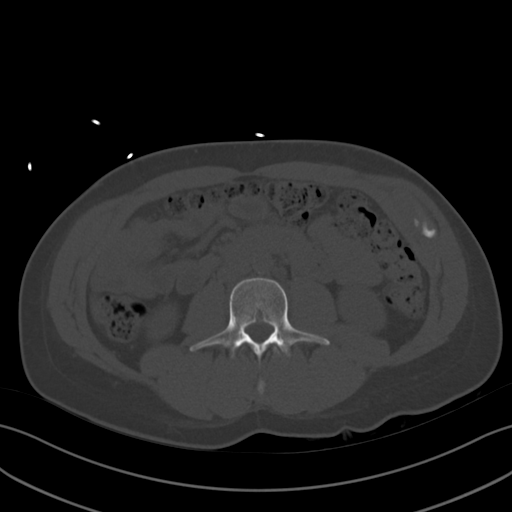
[im 61/92  soft-tissue]
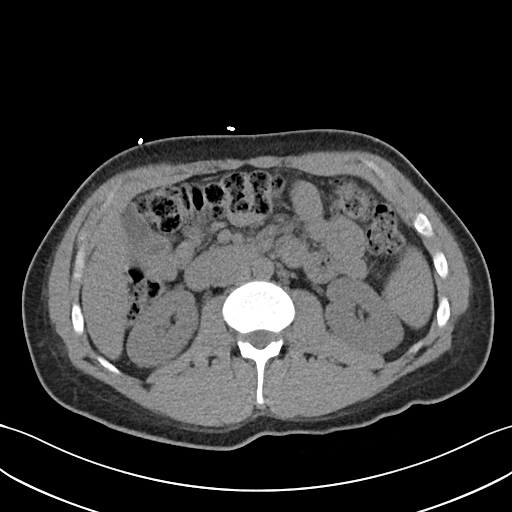
[im 66/92  soft-tissue]
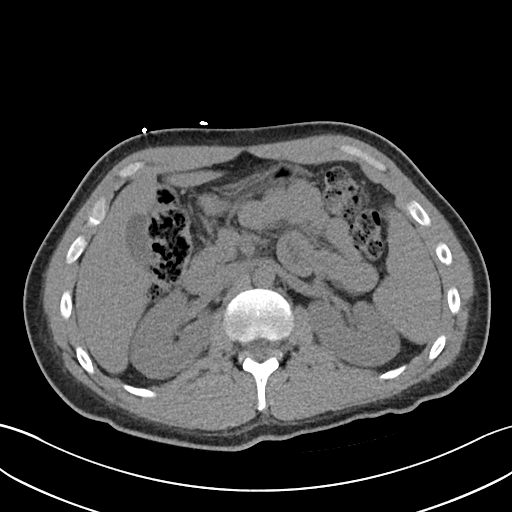
[im 71/92  soft-tissue]
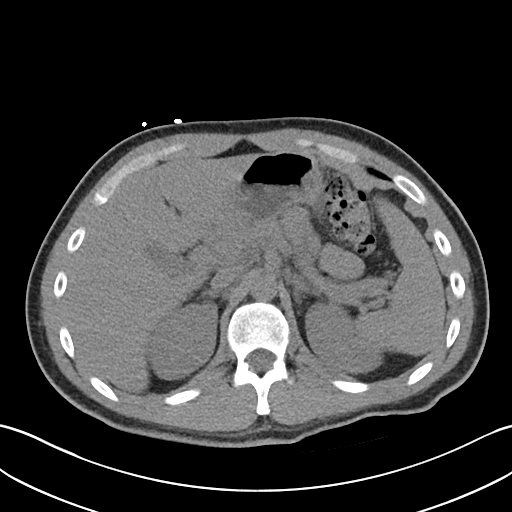
[im 81/92  soft-tissue]
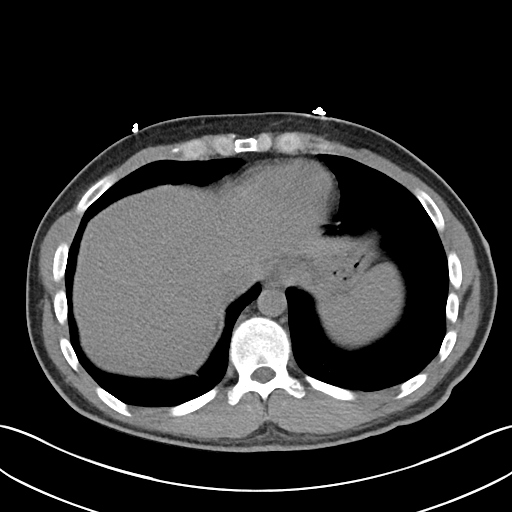
[im 86/92  soft-tissue]
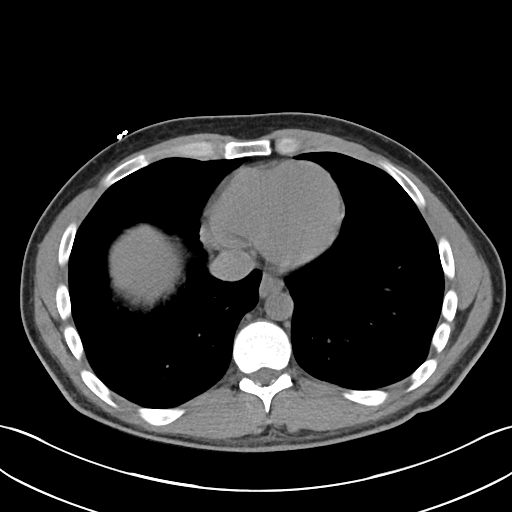

[Series 6: cor · coronal · 0.87mm/px · 3 of 85 slices shown]
[im 29/85  soft-tissue]
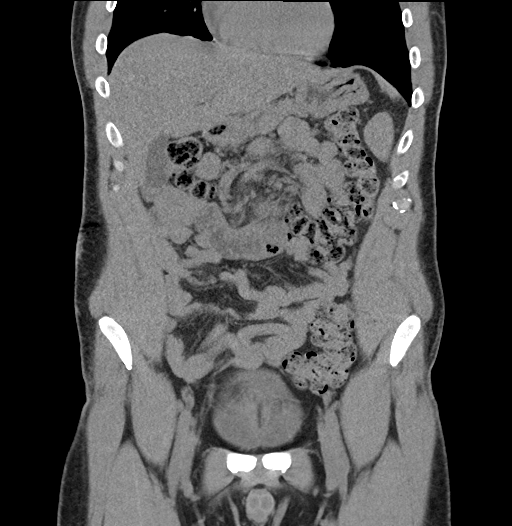
[im 38/85  soft-tissue]
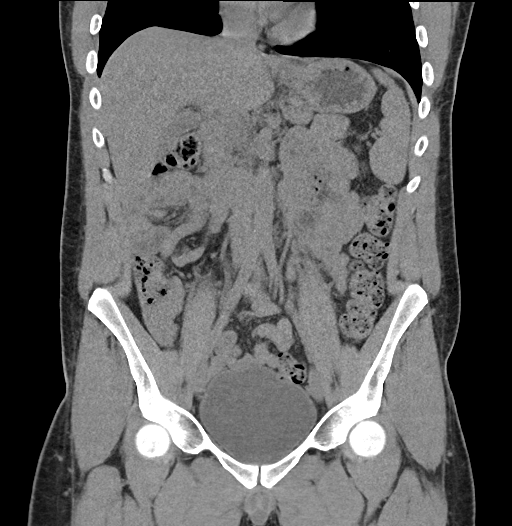
[im 47/85  soft-tissue]
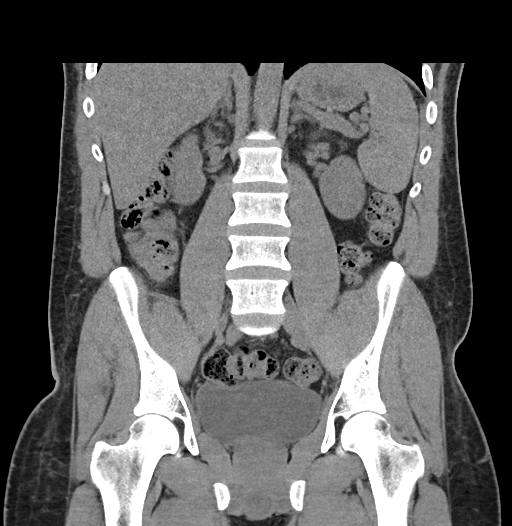

[17 of 46 positions shown; findings below may reference images not displayed]

FINDINGS: Lower chest: Normal

Hepatobiliary: Redemonstration of coarse calcifications at the right
dome of the liver, favored to represent calcifications within a
hemangioma. These are not changed and would not be clinically
significant.

Pancreas: No significant finding.

Spleen: Normal

Adrenals/Urinary Tract: Adrenal glands are normal. Kidneys are
normal. No cyst, mass, stone or hydronephrosis.

Stomach/Bowel: No abnormal bowel finding. No evidence of
obstruction, ileus, inflammatory change or focal lesion.

Vascular/Lymphatic: Aortic atherosclerosis, minimal. IVC is normal.
No adenopathy.

Reproductive: Normal

Other: No free fluid or air.

Musculoskeletal: Old minor superior endplate deformities at T10 and
T11. No evidence of recent injury. These do appear new since Monday April, 2020 however.
IMPRESSION: No acute CT finding to explain abdominal pain.

Chronic calcifications at the right dome of the liver favored to
represent calcifications within a hemangioma.

Aortic atherosclerosis, mild.

Minor superior endplate fractures at T10 and T11, new since Monday April, 2020 but likely chronic and healed.

## 2022-06-07 ENCOUNTER — Other Ambulatory Visit: Payer: Self-pay

## 2022-07-15 ENCOUNTER — Other Ambulatory Visit: Payer: Self-pay

## 2022-07-15 ENCOUNTER — Encounter: Payer: Self-pay | Admitting: Nurse Practitioner

## 2022-07-15 ENCOUNTER — Ambulatory Visit (INDEPENDENT_AMBULATORY_CARE_PROVIDER_SITE_OTHER): Payer: Self-pay | Admitting: Nurse Practitioner

## 2022-07-15 VITALS — BP 133/89 | HR 87 | Ht 71.0 in | Wt 185.5 lb

## 2022-07-15 DIAGNOSIS — E109 Type 1 diabetes mellitus without complications: Secondary | ICD-10-CM

## 2022-07-15 LAB — GLUCOSE, POCT (MANUAL RESULT ENTRY)
POC Glucose: 49 mg/dl — AB (ref 70–99)
POC Glucose: 97 mg/dl (ref 70–99)

## 2022-07-15 LAB — POCT GLYCOSYLATED HEMOGLOBIN (HGB A1C)
HbA1c POC (<> result, manual entry): 6.7 % (ref 4.0–5.6)
HbA1c, POC (controlled diabetic range): 6.7 % (ref 0.0–7.0)
HbA1c, POC (prediabetic range): 6.7 % — AB (ref 5.7–6.4)
Hemoglobin A1C: 6.7 % — AB (ref 4.0–5.6)

## 2022-07-15 MED ORDER — INSULIN ASPART 100 UNIT/ML IJ SOLN
INTRAMUSCULAR | 0 refills | Status: DC
  Filled 2022-07-15 – 2022-08-01 (×2): qty 10, 28d supply, fill #0
  Filled 2022-09-10: qty 10, 28d supply, fill #1
  Filled 2022-10-14: qty 10, 28d supply, fill #2
  Filled 2022-11-14: qty 10, 28d supply, fill #3
  Filled 2022-12-12: qty 10, 28d supply, fill #4
  Filled 2023-01-28 (×2): qty 10, 28d supply, fill #5
  Filled 2023-03-14: qty 10, 28d supply, fill #6
  Filled 2023-04-14: qty 10, 28d supply, fill #7
  Filled 2023-06-05: qty 10, 28d supply, fill #8
  Filled 2023-07-01: qty 10, 28d supply, fill #9

## 2022-07-15 MED ORDER — LISINOPRIL 10 MG PO TABS
10.0000 mg | ORAL_TABLET | Freq: Every day | ORAL | 1 refills | Status: DC
  Filled 2022-07-15 – 2022-08-01 (×3): qty 90, 90d supply, fill #0
  Filled 2022-11-14: qty 90, 90d supply, fill #1

## 2022-07-15 NOTE — Patient Instructions (Addendum)
1. Type 1 diabetes mellitus without complication (HCC)  Lab Results  Component Value Date   HGBA1C 7.5 (A) 04/12/2022   HGBA1C 7.5 04/12/2022   HGBA1C 7.5 (A) 04/12/2022   HGBA1C 7.5 (A) 04/12/2022     - POCT glycosylated hemoglobin (Hb A1C) - POCT glucose (manual entry) - AMB Referral to Pharmacy Medication Management - insulin aspart (NOVOLOG) 100 UNIT/ML injection; Use as directed per sliding scale  Dispense: 230 mL; Refill: 0 - CBC - Comprehensive metabolic panel - TSH  Note: blood sugar was low in office today - snack was given and blood sugar was rechecked.   Follow up:  Follow up in 3 months or sooner if needed

## 2022-07-15 NOTE — Assessment & Plan Note (Signed)
-   POCT glycosylated hemoglobin (Hb A1C) - POCT glucose (manual entry) - AMB Referral to Pharmacy Medication Management - insulin aspart (NOVOLOG) 100 UNIT/ML injection; Use as directed per sliding scale  Dispense: 230 mL; Refill: 0 - CBC - Comprehensive metabolic panel - TSH  Note: blood sugar was low in office today - snack was given and blood sugar was rechecked.   Follow up:  Follow up in 3 months or sooner if needed

## 2022-07-15 NOTE — Progress Notes (Signed)
@Patient  ID: Zachary Vazquez, male    DOB: 1986-02-12, 36 y.o.   MRN: [id number]  Chief Complaint  Patient presents with   Diabetes    Pt is here for 3 month's DM follow up visit.     Referring provider: Vevelyn Francois, NP   HPI  Zachary Vazquez 36 y.o. male  has a past medical history of History of delayed wound healing (10/2019), Rash and nonspecific skin eruption (10/2019), Type 1 diabetes mellitus ([place]), Vitamin D deficiency (09/2020), and Wound of lower extremity, left, sequela (10/2019). To the [facility] for three month follow up for DM.   Diabetes Mellitus: Patient presents for follow up of diabetes. States that he feels like his Blood sugar is low today. His blood sugar was 300 when he got up this morning and he took insulin, but did not eat. Evaluation to date has been included: hemoglobin A1C. Home sugars range unknown. Treatment to date: no recent interventions. Denies any other concerns today.    Allergies  Allergen Reactions   Clindamycin/Lincomycin Anaphylaxis   Bactrim [Sulfamethoxazole-Trimethoprim] Swelling and Other (See Comments)    Eyes swell and bumps appear on/near the eyes     Immunization History  Administered Date(s) Administered   Tdap 12/15/2019    Past Medical History:  Diagnosis Date   History of delayed wound healing 10/2019   Rash and nonspecific skin eruption 10/2019   Type 1 diabetes mellitus (HCC)    Vitamin D deficiency 09/2020   Wound of lower extremity, left, sequela 10/2019    Tobacco History: Social History   Tobacco Use  Smoking Status Every Day   Packs/day: 1.00   Years: 15.00   Total pack years: 15.00   Types: Cigarettes  Smokeless Tobacco Never   Ready to quit: Not Answered Counseling given: Not Answered   Outpatient Encounter Medications as of 07/15/2022  Medication Sig   Blood Glucose Monitoring Suppl KIT by Does not apply route 3 (three) times daily.   insulin glargine (LANTUS) 100 UNIT/ML injection Inject 0.35  mLs (35 Units total) into the skin once nightly at bedtime.   ondansetron (ZOFRAN) 4 MG tablet Take 1 tablet (4 mg total) by mouth every 8 (eight) hours as needed for nausea or vomiting.   [DISCONTINUED] insulin aspart (NOVOLOG) 100 UNIT/ML injection Use as directed per sliding scale   [DISCONTINUED] lisinopril (ZESTRIL) 10 MG tablet Take 1 tablet (10 mg total) by mouth daily.   Continuous Blood Gluc Receiver (FREESTYLE LIBRE 2 READER) DEVI 1 application. by Does not apply route every 14 (fourteen) days. (Patient not taking: Reported on 04/12/2022)   insulin aspart (NOVOLOG) 100 UNIT/ML injection Use as directed per sliding scale   lisinopril (ZESTRIL) 10 MG tablet Take 1 tablet (10 mg total) by mouth daily.   pantoprazole (PROTONIX) 40 MG tablet Take 1 tablet (40 mg total) by mouth daily.   No facility-administered encounter medications on file as of 07/15/2022.     Review of Systems  Review of Systems  Constitutional: Negative.   HENT: Negative.    Cardiovascular: Negative.   Gastrointestinal: Negative.   Allergic/Immunologic: Negative.   Neurological: Negative.   Psychiatric/Behavioral: Negative.         Physical Exam  BP 133/89 (BP Location: Right Arm, Patient Position: Sitting, Cuff Size: Large)   Pulse 87   Ht 5' 11"  (1.803 m)   Wt 185 lb 8 oz (84.1 kg)   SpO2 100%   BMI 25.87 kg/m   Wt Readings from Last  5 Encounters:  07/15/22 185 lb 8 oz (84.1 kg)  04/12/22 181 lb 9.6 oz (82.4 kg)  01/10/22 189 lb 9.6 oz (86 kg)  11/14/21 187 lb 6.3 oz (85 kg)  10/12/21 191 lb 3.2 oz (86.7 kg)     Physical Exam Vitals and nursing note reviewed.  Constitutional:      General: He is not in acute distress.    Appearance: He is well-developed.  Cardiovascular:     Rate and Rhythm: Normal rate and regular rhythm.  Pulmonary:     Effort: Pulmonary effort is normal.     Breath sounds: Normal breath sounds.  Skin:    General: Skin is warm and dry.  Neurological:     Mental  Status: He is alert and oriented to person, place, and time.      Lab Results:  CBC    Component Value Date/Time   WBC 7.7 11/17/2021 0053   RBC 4.12 (L) 11/17/2021 0053   HGB 12.9 (L) 11/17/2021 0053   HGB 15.0 07/13/2021 1158   HCT 38.2 (L) 11/17/2021 0053   HCT 45.9 07/13/2021 1158   PLT 193 11/17/2021 0053   PLT 253 07/13/2021 1158   MCV 92.7 11/17/2021 0053   MCV 95 07/13/2021 1158   MCH 31.3 11/17/2021 0053   MCHC 33.8 11/17/2021 0053   RDW 12.0 11/17/2021 0053   RDW 11.8 07/13/2021 1158   LYMPHSABS 1.9 11/17/2021 0053   LYMPHSABS 2.0 07/13/2021 1158   MONOABS 0.6 11/17/2021 0053   EOSABS 0.1 11/17/2021 0053   EOSABS 0.5 (H) 07/13/2021 1158   BASOSABS 0.0 11/17/2021 0053   BASOSABS 0.1 07/13/2021 1158    BMET    Component Value Date/Time   NA 134 01/10/2022 1207   K 5.3 (H) 01/10/2022 1207   CL 96 01/10/2022 1207   CO2 28 11/17/2021 0053   GLUCOSE 278 (H) 01/10/2022 1207   GLUCOSE 138 (H) 11/17/2021 0053   BUN 24 (H) 01/10/2022 1207   CREATININE 1.18 01/10/2022 1207   CALCIUM 9.7 01/10/2022 1207   GFRNONAA >60 11/17/2021 0053   GFRAA 82 10/11/2020 1444    BNP    Component Value Date/Time   BNP 41.9 11/17/2021 0053      Assessment & Plan:   Type 1 diabetes mellitus (HCC) - POCT glycosylated hemoglobin (Hb A1C) - POCT glucose (manual entry) - AMB Referral to Pharmacy Medication Management - insulin aspart (NOVOLOG) 100 UNIT/ML injection; Use as directed per sliding scale  Dispense: 230 mL; Refill: 0 - CBC - Comprehensive metabolic panel - TSH  Note: blood sugar was low in office today - snack was given and blood sugar was rechecked.   Follow up:  Follow up in 3 months or sooner if needed     Fenton Foy, NP 07/15/2022

## 2022-07-16 LAB — CBC
Hematocrit: 43 % (ref 37.5–51.0)
Hemoglobin: 14.5 g/dL (ref 13.0–17.7)
MCH: 31.9 pg (ref 26.6–33.0)
MCHC: 33.7 g/dL (ref 31.5–35.7)
MCV: 95 fL (ref 79–97)
Platelets: 269 10*3/uL (ref 150–450)
RBC: 4.54 x10E6/uL (ref 4.14–5.80)
RDW: 12 % (ref 11.6–15.4)
WBC: 12.9 10*3/uL — ABNORMAL HIGH (ref 3.4–10.8)

## 2022-07-16 LAB — COMPREHENSIVE METABOLIC PANEL
ALT: 23 IU/L (ref 0–44)
AST: 20 IU/L (ref 0–40)
Albumin/Globulin Ratio: 2.2 (ref 1.2–2.2)
Albumin: 4.7 g/dL (ref 4.1–5.1)
Alkaline Phosphatase: 57 IU/L (ref 44–121)
BUN/Creatinine Ratio: 18 (ref 9–20)
BUN: 23 mg/dL — ABNORMAL HIGH (ref 6–20)
Bilirubin Total: 0.3 mg/dL (ref 0.0–1.2)
CO2: 24 mmol/L (ref 20–29)
Calcium: 9.5 mg/dL (ref 8.7–10.2)
Chloride: 102 mmol/L (ref 96–106)
Creatinine, Ser: 1.28 mg/dL — ABNORMAL HIGH (ref 0.76–1.27)
Globulin, Total: 2.1 g/dL (ref 1.5–4.5)
Glucose: 80 mg/dL (ref 70–99)
Potassium: 4.8 mmol/L (ref 3.5–5.2)
Sodium: 139 mmol/L (ref 134–144)
Total Protein: 6.8 g/dL (ref 6.0–8.5)
eGFR: 75 mL/min/{1.73_m2} (ref >59)

## 2022-07-16 LAB — TSH: TSH: 1.78 u[IU]/mL (ref 0.450–4.500)

## 2022-07-22 ENCOUNTER — Other Ambulatory Visit: Payer: Self-pay

## 2022-07-25 ENCOUNTER — Other Ambulatory Visit: Payer: Self-pay | Admitting: Pharmacist

## 2022-07-25 DIAGNOSIS — E109 Type 1 diabetes mellitus without complications: Secondary | ICD-10-CM

## 2022-07-25 MED ORDER — FREESTYLE LIBRE 2 SENSOR MISC: 11 refills | Status: DC

## 2022-07-25 NOTE — Progress Notes (Signed)
Chief Complaint  Patient presents with   Medication Management   Diabetes    Zachary Vazquez is a 36 y.o. year old male who presented for a telephone visit.   They were referred to the pharmacist by their PCP for assistance in managing diabetes.    Subjective:  Care Team: Primary Care Provider: Fenton Foy, NP ; Next Scheduled Visit: 10/14/22  Medication Access/Adherence  Current Pharmacy:  [facility] [address] [place] [address] Phone: [phone] Fax: [place] [address] [place] [address] Phone: [phone] Fax: [phone]   Patient reports affordability concerns with their medications: No  Patient reports access/transportation concerns to their pharmacy: No  Patient reports adherence concerns with their medications:  No    Reports he is uninsured, but is able to afford copays at the Pharmacy at [facility].    Diabetes: Reports he was diagnosed in 2013. Has never consistently used CGM - he reports he reacts easily to tape/EKG lead adhesive in the hospital, so he's been concerned about CGM adhesive. However, he is willing to try. Has previously purchased Libre 2 and has several at home  Current medications: Lantus 30 units daily, Novolog per sliding scale, though does not specify the particular sliding scale rules.   Patient denies hypoglycemic s/sx including dizziness, shakiness, sweating since last hospitalization for hypoglycemia.   Health Maintenance  Health Maintenance Due  Topic Date Due   COVID-19 Vaccine (1) Never done   OPHTHALMOLOGY EXAM  Never done   Hepatitis C Screening  Never done   Diabetic kidney evaluation - Urine ACR  01/09/2022   INFLUENZA VACCINE  Never done     Objective: Lab Results  Component Value Date   HGBA1C 6.7 (A) 07/15/2022   HGBA1C 6.7 07/15/2022   HGBA1C 6.7 (A)  07/15/2022   HGBA1C 6.7 07/15/2022    Lab Results  Component Value Date   CREATININE 1.28 (H) 07/15/2022   BUN 23 (H) 07/15/2022   NA 139 07/15/2022   K 4.8 07/15/2022   CL 102 07/15/2022   CO2 24 07/15/2022    Lab Results  Component Value Date   CHOL 190 10/12/2021   HDL 58 10/12/2021   LDLCALC 121 (H) 10/12/2021   TRIG 57 10/12/2021   CHOLHDL 3.3 10/12/2021    Medications Reviewed Today     Reviewed by Osker Mason, RPH-CPP (Pharmacist) on 07/25/22 at 1346  Med List Status: <None>   Medication Order Taking? Sig Documenting Provider Last Dose Status Informant  Blood Glucose Monitoring Suppl KIT [id number] Yes by Does not apply route 3 (three) times daily. [provider] Taking Active Self  Continuous Blood Gluc Receiver (FREESTYLE LIBRE 2 READER) DEVI [id number] No 1 application. by Does not apply route every 14 (fourteen) days.  Patient not taking: Reported on 04/12/2022   Vevelyn Francois, NP Not Taking Active   insulin aspart (NOVOLOG) 100 UNIT/ML injection [id number] Yes Use as directed per sliding scale Fenton Foy, NP Taking Active   insulin glargine (LANTUS) 100 UNIT/ML injection [id number] Yes Inject 0.35 mLs (35 Units total) into the skin once nightly at bedtime. Bo Merino I, NP Taking Active            Med Note Jodi Mourning, Tillie Fantasia Jul 25, 2022  1:37 PM) 30 units daily  lisinopril (ZESTRIL) 10 MG tablet [id number] Yes Take 1 tablet (  10 mg total) by mouth daily. Fenton Foy, NP Taking Active               Assessment/Plan:   Diabetes: - Currently controlled per A1c but with risk of hypoglycemia - Discussed benefit of CGM. Patient interested in trying the [employer] 2 supply he has at home. Discussed that Dex[employer] does offer a patient assistance program of he would like to pursue that moving forward.  - He will [employer] 2 app and set up. Discussed use of LibreView - Helped reset MyChart password.  - Recommend to continue  current regimen at this time  Follow Up Plan: phone call in 4 weeks  Catie TJodi Mourning, PharmD, [facility] [phone]

## 2022-07-25 NOTE — Patient Instructions (Signed)
Chuong,   It was great talking to you today!  Download the [place] 2 app. Walk through how to set up the app and place a sensor.   Once started, you can connect to our clinic's [facility] account. Go to Settings (3 horizontal lines in the top left corner) -> Connected Apps (near the bottom) -> LibreView -> Practice ID is "[id number]" -> Next -> you will then be connected to our portal, and I can see your glucose readings at our future visit.   Once you start using a CGM, our goal is to keep your "Time in Range" between 80 and 180 at least 70% of the time, minimizing hypoglycemia.   Please give me a call if you have any questions or concerns.   Thanks!  Catie Hedwig Morton, PharmD, [facility] [phone]

## 2022-08-01 ENCOUNTER — Other Ambulatory Visit (HOSPITAL_COMMUNITY): Payer: Self-pay

## 2022-08-01 ENCOUNTER — Other Ambulatory Visit: Payer: Self-pay

## 2022-08-02 ENCOUNTER — Other Ambulatory Visit: Payer: Self-pay

## 2022-08-21 ENCOUNTER — Other Ambulatory Visit: Payer: Self-pay | Admitting: Pharmacist

## 2022-08-21 DIAGNOSIS — E109 Type 1 diabetes mellitus without complications: Secondary | ICD-10-CM

## 2022-08-21 MED ORDER — FREESTYLE LIBRE 2 SENSOR MISC: 11 refills | Status: DC

## 2022-08-21 NOTE — Progress Notes (Signed)
Care Coordination Call  Called patient for follow up appointment for diabetes. He was unable to find the [place] 2 sensors he had previously paid for, so requests a new script be sent to his pharmacy. We discussed that Dex[employer] appears to have a patient assistance program for uninsured patients with type 1 diabetes, but he notes he would rather stick with Elenor Legato at this time as he has already downloaded the app on his phone.   Script sent to pharmacy per Primary Care standing order. Scheduled follow up phone call in 2 weeks for follow up.   Catie Hedwig Morton, PharmD, [facility] [phone]

## 2022-09-05 ENCOUNTER — Telehealth: Payer: Self-pay | Admitting: Pharmacist

## 2022-09-05 ENCOUNTER — Other Ambulatory Visit: Payer: Self-pay | Admitting: Pharmacist

## 2022-09-05 NOTE — Progress Notes (Signed)
Attempted to contact patient for scheduled appointment for medication management. Left HIPAA compliant message for patient to return my call at their convenience.   Catie T. Dakari Cregger, PharmD, BCACP [facility] [phone]  

## 2022-09-10 ENCOUNTER — Other Ambulatory Visit: Payer: Self-pay

## 2022-09-11 ENCOUNTER — Telehealth: Payer: Self-pay

## 2022-09-11 NOTE — Progress Notes (Signed)
   Care Guide Note  09/11/2022 Name: Zachary Vazquez MRN: [id number] DOB: [birth year]  Referred by: Ivonne Andrew, NP Reason for referral : Care Coordination (Outreach to schedule with Pharm D Catie )   Zachary Vazquez is a 36 y.o. year old male who is a primary care patient of Ivonne Andrew, NP. Zachary Vazquez was referred to the pharmacist for assistance related to DM.    Successful contact was made with the patient to discuss pharmacy services including being ready for the pharmacist to call at least 5 minutes before the scheduled appointment time, to have medication bottles and any blood sugar or blood pressure readings ready for review. The patient agreed to meet with the pharmacist via with the pharmacist via telephone visit on (date/time).  10/08/2022  Zachary Vazquez, Zachary Vazquez Care Guide [facility]  [place], [place] [address] Direct Dial: [phone] [email or web address]

## 2022-09-11 NOTE — Telephone Encounter (Signed)
Pt has started [place] and is scheduled for 10/08/2022 @ 3:30

## 2022-09-13 ENCOUNTER — Other Ambulatory Visit: Payer: Self-pay

## 2022-10-08 ENCOUNTER — Other Ambulatory Visit: Payer: Self-pay | Admitting: Pharmacist

## 2022-10-09 ENCOUNTER — Other Ambulatory Visit: Payer: Self-pay | Admitting: Pharmacist

## 2022-10-09 ENCOUNTER — Telehealth: Payer: Self-pay | Admitting: Pharmacist

## 2022-10-11 NOTE — Progress Notes (Signed)
Attempted to contact patient for scheduled appointment for medication management. Left HIPAA compliant message for patient to return my call at their convenience.   Catie T. Taz Vanness, PharmD, BCACP [facility] [phone]  

## 2022-10-14 ENCOUNTER — Other Ambulatory Visit: Payer: Self-pay

## 2022-10-14 ENCOUNTER — Ambulatory Visit: Payer: Self-pay | Admitting: Nurse Practitioner

## 2022-11-14 ENCOUNTER — Other Ambulatory Visit: Payer: Self-pay

## 2022-11-14 ENCOUNTER — Other Ambulatory Visit (HOSPITAL_COMMUNITY): Payer: Self-pay

## 2022-11-15 ENCOUNTER — Other Ambulatory Visit: Payer: Self-pay

## 2022-12-12 ENCOUNTER — Other Ambulatory Visit: Payer: Self-pay

## 2022-12-18 ENCOUNTER — Ambulatory Visit (INDEPENDENT_AMBULATORY_CARE_PROVIDER_SITE_OTHER): Payer: Self-pay | Admitting: Nurse Practitioner

## 2022-12-18 ENCOUNTER — Encounter: Payer: Self-pay | Admitting: Nurse Practitioner

## 2022-12-18 VITALS — BP 157/132 | HR 101 | Temp 97.4°F | Wt 173.4 lb

## 2022-12-18 DIAGNOSIS — E1010 Type 1 diabetes mellitus with ketoacidosis without coma: Secondary | ICD-10-CM

## 2022-12-18 NOTE — Progress Notes (Unsigned)
$@PatientL$  ID: Zachary Vazquez, male    DOB: [birth year], 37 y.o.   MRN: [id number]  Chief Complaint  Patient presents with   Diabetes    Follow up    Referring provider: Fenton Foy, NP  HPI  Zachary Vazquez 37 y.o. male  has a past medical history of History of delayed wound healing (10/2019), Rash and nonspecific skin eruption (10/2019), Type 1 diabetes mellitus ([place]), Vitamin D deficiency (09/2020), and Wound of lower extremity, left, sequela (10/2019). To the [facility] for three month follow up for DM.   Diabetes Mellitus: Patient presents for follow up of diabetes. Evaluation to date has been included: hemoglobin A1C. Home sugars range unknown. Treatment to date: no recent interventions. Denies any other concerns today.    Hypertension:  Patient presents today for follow-up on hypertension as well.  Blood pressure is elevated in office today.  Patient did not take his blood pressure medicine in the past 24 to 48 hours.  He also states that he is nervous today because he has been in a job.   Denies f/c/s, n/v/d, hemoptysis, PND, leg swelling Denies chest pain or edema  Patient declines blood work today.       Allergies  Allergen Reactions   Clindamycin/Lincomycin Anaphylaxis   Bactrim [Sulfamethoxazole-Trimethoprim] Swelling and Other (See Comments)    Eyes swell and bumps appear on/near the eyes     Immunization History  Administered Date(s) Administered   Tdap 12/15/2019    Past Medical History:  Diagnosis Date   History of delayed wound healing 10/2019   Rash and nonspecific skin eruption 10/2019   Type 1 diabetes mellitus (HCC)    Vitamin D deficiency 09/2020   Wound of lower extremity, left, sequela 10/2019    Tobacco History: Social History   Tobacco Use  Smoking Status Every Day   Packs/day: 1.00   Years: 15.00   Total pack years: 15.00   Types: Cigarettes  Smokeless Tobacco Never   Ready to quit: Not Answered Counseling given: Not  Answered   Outpatient Encounter Medications as of 12/18/2022  Medication Sig   Blood Glucose Monitoring Suppl KIT by Does not apply route 3 (three) times daily.   insulin aspart (NOVOLOG) 100 UNIT/ML injection Use as directed per sliding scale   insulin glargine (LANTUS) 100 UNIT/ML injection Inject 0.35 mLs (35 Units total) into the skin once nightly at bedtime.   lisinopril (ZESTRIL) 10 MG tablet Take 1 tablet (10 mg total) by mouth daily.   Continuous Blood Gluc Receiver (FREESTYLE LIBRE 2 READER) DEVI 1 application. by Does not apply route every 14 (fourteen) days. (Patient not taking: Reported on 04/12/2022)   Continuous Blood Gluc Sensor (FREESTYLE LIBRE 2 SENSOR) MISC Use to check glucose (Patient not taking: Reported on 12/18/2022)   No facility-administered encounter medications on file as of 12/18/2022.     Review of Systems  Review of Systems  Constitutional: Negative.   HENT: Negative.    Cardiovascular: Negative.   Gastrointestinal: Negative.   Allergic/Immunologic: Negative.   Neurological: Negative.   Psychiatric/Behavioral: Negative.         Physical Exam  BP (!) 157/132   Pulse (!) 101   Temp (!) 97.4 F (36.3 C)   Wt 173 lb 6.4 oz (78.7 kg)   SpO2 99%   BMI 24.18 kg/m   Wt Readings from Last 5 Encounters:  12/18/22 173 lb 6.4 oz (78.7 kg)  07/15/22 185 lb 8 oz (84.1 kg)  04/12/22 181  lb 9.6 oz (82.4 kg)  01/10/22 189 lb 9.6 oz (86 kg)  11/14/21 187 lb 6.3 oz (85 kg)     Physical Exam Vitals and nursing note reviewed.  Constitutional:      General: He is not in acute distress.    Appearance: He is well-developed.  Cardiovascular:     Rate and Rhythm: Normal rate and regular rhythm.  Pulmonary:     Effort: Pulmonary effort is normal.     Breath sounds: Normal breath sounds.  Skin:    General: Skin is warm and dry.  Neurological:     Mental Status: He is alert and oriented to person, place, and time.      Lab Results:  CBC     Component Value Date/Time   WBC 12.9 (H) 07/15/2022 1109   WBC 7.7 11/17/2021 0053   RBC 4.54 07/15/2022 1109   RBC 4.12 (L) 11/17/2021 0053   HGB 14.5 07/15/2022 1109   HCT 43.0 07/15/2022 1109   PLT 269 07/15/2022 1109   MCV 95 07/15/2022 1109   MCH 31.9 07/15/2022 1109   MCH 31.3 11/17/2021 0053   MCHC 33.7 07/15/2022 1109   MCHC 33.8 11/17/2021 0053   RDW 12.0 07/15/2022 1109   LYMPHSABS 1.9 11/17/2021 0053   LYMPHSABS 2.0 07/13/2021 1158   MONOABS 0.6 11/17/2021 0053   EOSABS 0.1 11/17/2021 0053   EOSABS 0.5 (H) 07/13/2021 1158   BASOSABS 0.0 11/17/2021 0053   BASOSABS 0.1 07/13/2021 1158    BMET    Component Value Date/Time   NA 139 07/15/2022 1109   K 4.8 07/15/2022 1109   CL 102 07/15/2022 1109   CO2 24 07/15/2022 1109   GLUCOSE 80 07/15/2022 1109   GLUCOSE 138 (H) 11/17/2021 0053   BUN 23 (H) 07/15/2022 1109   CREATININE 1.28 (H) 07/15/2022 1109   CALCIUM 9.5 07/15/2022 1109   GFRNONAA >60 11/17/2021 0053   GFRAA 82 10/11/2020 1444     Assessment & Plan:   Uncontrolled type 1 diabetes mellitus with ketoacidosis without coma, with long-term current use of insulin (HCC) - POCT glycosylated hemoglobin (Hb A1C) - Microalbumin/Creatinine Ratio, Urine - CBC - Comprehensive metabolic panel - Lipid Panel    Follow up:  Follow up in 3 months      Fenton Foy, NP 12/19/2022

## 2022-12-18 NOTE — Patient Instructions (Addendum)
1. Uncontrolled type 1 diabetes mellitus with ketoacidosis without coma, with long-term current use of insulin (HCC)  - POCT glycosylated hemoglobin (Hb A1C) - Microalbumin/Creatinine Ratio, Urine - CBC - Comprehensive metabolic panel - Lipid Panel    Follow up:  Follow up in 3 months

## 2022-12-19 ENCOUNTER — Encounter: Payer: Self-pay | Admitting: Nurse Practitioner

## 2022-12-19 LAB — POCT GLYCOSYLATED HEMOGLOBIN (HGB A1C): Hemoglobin A1C: 6.4 % — AB (ref 4.0–5.6)

## 2022-12-19 NOTE — Assessment & Plan Note (Signed)
-   POCT glycosylated hemoglobin (Hb A1C) - Microalbumin/Creatinine Ratio, Urine - CBC - Comprehensive metabolic panel - Lipid Panel    Follow up:  Follow up in 3 months

## 2022-12-20 LAB — MICROALBUMIN / CREATININE URINE RATIO
Creatinine, Urine: 165.9 mg/dL
Microalb/Creat Ratio: 559 mg/g creat — ABNORMAL HIGH (ref 0–29)
Microalbumin, Urine: 928.1 ug/mL

## 2022-12-23 ENCOUNTER — Other Ambulatory Visit: Payer: Self-pay | Admitting: Nurse Practitioner

## 2022-12-23 DIAGNOSIS — R809 Proteinuria, unspecified: Secondary | ICD-10-CM

## 2023-01-28 ENCOUNTER — Other Ambulatory Visit: Payer: Self-pay

## 2023-01-28 ENCOUNTER — Telehealth: Payer: Self-pay | Admitting: Nurse Practitioner

## 2023-01-28 ENCOUNTER — Other Ambulatory Visit: Payer: Self-pay | Admitting: Nurse Practitioner

## 2023-01-28 MED ORDER — LISINOPRIL 10 MG PO TABS
10.0000 mg | ORAL_TABLET | Freq: Every day | ORAL | 1 refills | Status: DC
  Filled 2023-01-28: qty 90, 90d supply, fill #0

## 2023-01-28 NOTE — Telephone Encounter (Signed)
Pharmacy is fill in the blood pressure medication advice pt to call pharmacy to make sure they have medication ready. [place]

## 2023-01-28 NOTE — Telephone Encounter (Signed)
Caller & Relationship to patient:  MRN #  [id number]   Call Back Number:   Date of Last Office Visit: 01/28/2023     Date of Next Office Visit: 03/19/2023    Medication(s) to be Refilled: Bp med to be refilled  Preferred Pharmacy:   ** Please notify patient to allow 48-72 hours to process** **Let patient know to contact pharmacy at the end of the day to make sure medication is ready. ** **If patient has not been seen in a year or longer, book an appointment **Advise to use MyChart for refill requests OR to contact their pharmacy

## 2023-03-14 ENCOUNTER — Other Ambulatory Visit: Payer: Self-pay

## 2023-03-17 ENCOUNTER — Other Ambulatory Visit: Payer: Self-pay

## 2023-03-19 ENCOUNTER — Ambulatory Visit: Payer: Self-pay | Admitting: Nurse Practitioner

## 2023-03-20 ENCOUNTER — Other Ambulatory Visit: Payer: Self-pay

## 2023-04-03 ENCOUNTER — Other Ambulatory Visit: Payer: Self-pay

## 2023-04-03 ENCOUNTER — Encounter (HOSPITAL_COMMUNITY): Payer: Self-pay

## 2023-04-03 ENCOUNTER — Inpatient Hospital Stay (HOSPITAL_COMMUNITY)
Admission: EM | Admit: 2023-04-03 | Discharge: 2023-04-08 | DRG: 638 | Disposition: A | Discharge status: Home / Self Care | Payer: Self-pay | Attending: Family Medicine | Admitting: Family Medicine

## 2023-04-03 DIAGNOSIS — F17210 Nicotine dependence, cigarettes, uncomplicated: Secondary | ICD-10-CM | POA: Diagnosis present

## 2023-04-03 DIAGNOSIS — Z79899 Other long term (current) drug therapy: Secondary | ICD-10-CM

## 2023-04-03 DIAGNOSIS — Z794 Long term (current) use of insulin: Secondary | ICD-10-CM

## 2023-04-03 DIAGNOSIS — I152 Hypertension secondary to endocrine disorders: Secondary | ICD-10-CM | POA: Diagnosis present

## 2023-04-03 DIAGNOSIS — F32A Depression, unspecified: Secondary | ICD-10-CM | POA: Diagnosis present

## 2023-04-03 DIAGNOSIS — E1010 Type 1 diabetes mellitus with ketoacidosis without coma: Principal | ICD-10-CM | POA: Diagnosis present

## 2023-04-03 DIAGNOSIS — Z882 Allergy status to sulfonamides status: Secondary | ICD-10-CM

## 2023-04-03 DIAGNOSIS — E109 Type 1 diabetes mellitus without complications: Secondary | ICD-10-CM

## 2023-04-03 DIAGNOSIS — R17 Unspecified jaundice: Secondary | ICD-10-CM | POA: Diagnosis present

## 2023-04-03 DIAGNOSIS — E1110 Type 2 diabetes mellitus with ketoacidosis without coma: Secondary | ICD-10-CM | POA: Diagnosis present

## 2023-04-03 DIAGNOSIS — Z881 Allergy status to other antibiotic agents status: Secondary | ICD-10-CM

## 2023-04-03 DIAGNOSIS — E1159 Type 2 diabetes mellitus with other circulatory complications: Secondary | ICD-10-CM | POA: Insufficient documentation

## 2023-04-03 DIAGNOSIS — E1069 Type 1 diabetes mellitus with other specified complication: Secondary | ICD-10-CM | POA: Diagnosis present

## 2023-04-03 DIAGNOSIS — N179 Acute kidney failure, unspecified: Secondary | ICD-10-CM | POA: Diagnosis present

## 2023-04-03 DIAGNOSIS — R112 Nausea with vomiting, unspecified: Secondary | ICD-10-CM

## 2023-04-03 DIAGNOSIS — A084 Viral intestinal infection, unspecified: Secondary | ICD-10-CM | POA: Diagnosis present

## 2023-04-03 DIAGNOSIS — R7989 Other specified abnormal findings of blood chemistry: Secondary | ICD-10-CM | POA: Insufficient documentation

## 2023-04-03 DIAGNOSIS — E860 Dehydration: Secondary | ICD-10-CM | POA: Diagnosis present

## 2023-04-03 LAB — CBC WITH DIFFERENTIAL/PLATELET
Abs Immature Granulocytes: 0.11 10*3/uL — ABNORMAL HIGH (ref 0.00–0.07)
Basophils Absolute: 0.1 10*3/uL (ref 0.0–0.1)
Basophils Relative: 0 %
Eosinophils Absolute: 0 10*3/uL (ref 0.0–0.5)
Eosinophils Relative: 0 %
HCT: 39.4 % (ref 39.0–52.0)
Hemoglobin: 13.8 g/dL (ref 13.0–17.0)
Immature Granulocytes: 1 %
Lymphocytes Relative: 6 %
Lymphs Abs: 0.9 10*3/uL (ref 0.7–4.0)
MCH: 31 pg (ref 26.0–34.0)
MCHC: 35 g/dL (ref 30.0–36.0)
MCV: 88.5 fL (ref 80.0–100.0)
Monocytes Absolute: 0.3 10*3/uL (ref 0.1–1.0)
Monocytes Relative: 2 %
Neutro Abs: 14.8 10*3/uL — ABNORMAL HIGH (ref 1.7–7.7)
Neutrophils Relative %: 91 %
Platelets: 310 10*3/uL (ref 150–400)
RBC: 4.45 MIL/uL (ref 4.22–5.81)
RDW: 11.9 % (ref 11.5–15.5)
WBC: 16.1 10*3/uL — ABNORMAL HIGH (ref 4.0–10.5)
nRBC: 0 % (ref 0.0–0.2)

## 2023-04-03 LAB — URINALYSIS, ROUTINE W REFLEX MICROSCOPIC
Bacteria, UA: NONE SEEN
Bilirubin Urine: NEGATIVE
Glucose, UA: >=500 mg/dL — AB
Ketones, ur: 80 mg/dL — AB
Leukocytes,Ua: NEGATIVE
Nitrite: NEGATIVE
Protein, ur: 30 mg/dL — AB
Specific Gravity, Urine: 1.017 (ref 1.005–1.030)
pH: 5 (ref 5.0–8.0)

## 2023-04-03 LAB — BASIC METABOLIC PANEL
Anion gap: 18 — ABNORMAL HIGH (ref 5–15)
Anion gap: 13 (ref 5–15)
Anion gap: 10 (ref 5–15)
BUN: 28 mg/dL — ABNORMAL HIGH (ref 6–20)
BUN: 27 mg/dL — ABNORMAL HIGH (ref 6–20)
BUN: 23 mg/dL — ABNORMAL HIGH (ref 6–20)
CO2: 18 mmol/L — ABNORMAL LOW (ref 22–32)
CO2: 24 mmol/L (ref 22–32)
CO2: 26 mmol/L (ref 22–32)
Calcium: 8.7 mg/dL — ABNORMAL LOW (ref 8.9–10.3)
Calcium: 9.1 mg/dL (ref 8.9–10.3)
Calcium: 9.2 mg/dL (ref 8.9–10.3)
Chloride: 90 mmol/L — ABNORMAL LOW (ref 98–111)
Chloride: 94 mmol/L — ABNORMAL LOW (ref 98–111)
Chloride: 96 mmol/L — ABNORMAL LOW (ref 98–111)
Creatinine, Ser: 1.89 mg/dL — ABNORMAL HIGH (ref 0.61–1.24)
Creatinine, Ser: 1.73 mg/dL — ABNORMAL HIGH (ref 0.61–1.24)
Creatinine, Ser: 1.55 mg/dL — ABNORMAL HIGH (ref 0.61–1.24)
GFR, Estimated: 47 mL/min — ABNORMAL LOW (ref >60)
GFR, Estimated: 52 mL/min — ABNORMAL LOW (ref >60)
GFR, Estimated: 59 mL/min — ABNORMAL LOW (ref >60)
Glucose, Bld: 422 mg/dL — ABNORMAL HIGH (ref 70–99)
Glucose, Bld: 203 mg/dL — ABNORMAL HIGH (ref 70–99)
Glucose, Bld: 137 mg/dL — ABNORMAL HIGH (ref 70–99)
Potassium: 4.5 mmol/L (ref 3.5–5.1)
Potassium: 3.8 mmol/L (ref 3.5–5.1)
Potassium: 3.8 mmol/L (ref 3.5–5.1)
Sodium: 126 mmol/L — ABNORMAL LOW (ref 135–145)
Sodium: 131 mmol/L — ABNORMAL LOW (ref 135–145)
Sodium: 132 mmol/L — ABNORMAL LOW (ref 135–145)

## 2023-04-03 LAB — COMPREHENSIVE METABOLIC PANEL
ALT: 23 U/L (ref 0–44)
AST: 23 U/L (ref 15–41)
Albumin: 4 g/dL (ref 3.5–5.0)
Alkaline Phosphatase: 54 U/L (ref 38–126)
Anion gap: 26 — ABNORMAL HIGH (ref 5–15)
BUN: 30 mg/dL — ABNORMAL HIGH (ref 6–20)
CO2: 14 mmol/L — ABNORMAL LOW (ref 22–32)
Calcium: 9.1 mg/dL (ref 8.9–10.3)
Chloride: 85 mmol/L — ABNORMAL LOW (ref 98–111)
Creatinine, Ser: 1.85 mg/dL — ABNORMAL HIGH (ref 0.61–1.24)
GFR, Estimated: 48 mL/min — ABNORMAL LOW (ref >60)
Glucose, Bld: 460 mg/dL — ABNORMAL HIGH (ref 70–99)
Potassium: 5.3 mmol/L — ABNORMAL HIGH (ref 3.5–5.1)
Sodium: 125 mmol/L — ABNORMAL LOW (ref 135–145)
Total Bilirubin: 2.6 mg/dL — ABNORMAL HIGH (ref 0.3–1.2)
Total Protein: 6.3 g/dL — ABNORMAL LOW (ref 6.5–8.1)

## 2023-04-03 LAB — GLUCOSE, CAPILLARY
Glucose-Capillary: 248 mg/dL — ABNORMAL HIGH (ref 70–99)
Glucose-Capillary: 260 mg/dL — ABNORMAL HIGH (ref 70–99)
Glucose-Capillary: 222 mg/dL — ABNORMAL HIGH (ref 70–99)
Glucose-Capillary: 202 mg/dL — ABNORMAL HIGH (ref 70–99)
Glucose-Capillary: 184 mg/dL — ABNORMAL HIGH (ref 70–99)
Glucose-Capillary: 155 mg/dL — ABNORMAL HIGH (ref 70–99)
Glucose-Capillary: 161 mg/dL — ABNORMAL HIGH (ref 70–99)
Glucose-Capillary: 144 mg/dL — ABNORMAL HIGH (ref 70–99)
Glucose-Capillary: 114 mg/dL — ABNORMAL HIGH (ref 70–99)
Glucose-Capillary: 131 mg/dL — ABNORMAL HIGH (ref 70–99)

## 2023-04-03 LAB — I-STAT VENOUS BLOOD GAS, ED
Acid-base deficit: 7 mmol/L — ABNORMAL HIGH (ref 0.0–2.0)
Bicarbonate: 15.5 mmol/L — ABNORMAL LOW (ref 20.0–28.0)
Calcium, Ion: 1.03 mmol/L — ABNORMAL LOW (ref 1.15–1.40)
HCT: 42 % (ref 39.0–52.0)
Hemoglobin: 14.3 g/dL (ref 13.0–17.0)
O2 Saturation: 94 %
Potassium: 5.1 mmol/L (ref 3.5–5.1)
Sodium: 122 mmol/L — ABNORMAL LOW (ref 135–145)
TCO2: 16 mmol/L — ABNORMAL LOW (ref 22–32)
pCO2, Ven: 23.8 mmHg — ABNORMAL LOW (ref 44–60)
pH, Ven: 7.421 (ref 7.25–7.43)
pO2, Ven: 68 mmHg — ABNORMAL HIGH (ref 32–45)

## 2023-04-03 LAB — I-STAT BETA HCG BLOOD, ED (MC, WL, AP ONLY): I-stat hCG, quantitative: <5 m[IU]/mL (ref <5)

## 2023-04-03 LAB — BETA-HYDROXYBUTYRIC ACID
Beta-Hydroxybutyric Acid: 6.39 mmol/L — ABNORMAL HIGH (ref 0.05–0.27)
Beta-Hydroxybutyric Acid: 2.2 mmol/L — ABNORMAL HIGH (ref 0.05–0.27)

## 2023-04-03 LAB — CBG MONITORING, ED
Glucose-Capillary: 419 mg/dL — ABNORMAL HIGH (ref 70–99)
Glucose-Capillary: 381 mg/dL — ABNORMAL HIGH (ref 70–99)
Glucose-Capillary: 308 mg/dL — ABNORMAL HIGH (ref 70–99)

## 2023-04-03 MED ORDER — ACETAMINOPHEN 650 MG RE SUPP: 650.0000 mg | Freq: Four times a day (QID) | RECTAL | Status: DC | PRN

## 2023-04-03 MED ORDER — DIPHENHYDRAMINE HCL 50 MG/ML IJ SOLN
50.0000 mg | Freq: Once | INTRAMUSCULAR | Status: AC
  Administered 2023-04-03: 50 mg via INTRAVENOUS
  Filled 2023-04-03: qty 1

## 2023-04-03 MED ORDER — DIPHENHYDRAMINE HCL 50 MG/ML IJ SOLN
25.0000 mg | Freq: Once | INTRAMUSCULAR | Status: AC
  Administered 2023-04-03: 25 mg via INTRAVENOUS
  Filled 2023-04-03: qty 1

## 2023-04-03 MED ORDER — ONDANSETRON HCL 4 MG/2ML IJ SOLN
4.0000 mg | Freq: Once | INTRAMUSCULAR | Status: AC
  Administered 2023-04-03: 4 mg via INTRAVENOUS
  Filled 2023-04-03: qty 2

## 2023-04-03 MED ORDER — DEXTROSE 50 % IV SOLN: 0.0000 mL | INTRAVENOUS | Status: DC | PRN

## 2023-04-03 MED ORDER — PROCHLORPERAZINE EDISYLATE 10 MG/2ML IJ SOLN
10.0000 mg | Freq: Once | INTRAMUSCULAR | Status: AC
  Administered 2023-04-03: 10 mg via INTRAVENOUS
  Filled 2023-04-03: qty 2

## 2023-04-03 MED ORDER — ONDANSETRON HCL 4 MG/2ML IJ SOLN
4.0000 mg | Freq: Three times a day (TID) | INTRAMUSCULAR | Status: DC
  Administered 2023-04-03 (×2): 4 mg via INTRAVENOUS
  Filled 2023-04-03 (×2): qty 2

## 2023-04-03 MED ORDER — METOCLOPRAMIDE HCL 5 MG/ML IJ SOLN
10.0000 mg | Freq: Once | INTRAMUSCULAR | Status: AC
  Administered 2023-04-03: 10 mg via INTRAVENOUS
  Filled 2023-04-03: qty 2

## 2023-04-03 MED ORDER — ENOXAPARIN SODIUM 40 MG/0.4ML IJ SOSY
40.0000 mg | PREFILLED_SYRINGE | INTRAMUSCULAR | Status: DC
  Administered 2023-04-03 – 2023-04-07 (×5): 40 mg via SUBCUTANEOUS
  Filled 2023-04-03 (×5): qty 0.4

## 2023-04-03 MED ORDER — ACETAMINOPHEN 325 MG PO TABS: 650.0000 mg | ORAL_TABLET | Freq: Four times a day (QID) | ORAL | Status: DC | PRN

## 2023-04-03 MED ORDER — LACTATED RINGERS IV BOLUS
2000.0000 mL | Freq: Once | INTRAVENOUS | Status: AC
  Administered 2023-04-03: 1000 mL via INTRAVENOUS

## 2023-04-03 MED ORDER — INSULIN REGULAR(HUMAN) IN NACL 100-0.9 UT/100ML-% IV SOLN
INTRAVENOUS | Status: DC
  Administered 2023-04-03: 7.5 [IU]/h via INTRAVENOUS
  Administered 2023-04-03: 8 [IU]/h via INTRAVENOUS
  Administered 2023-04-03: 3 [IU]/h via INTRAVENOUS
  Filled 2023-04-03 (×2): qty 100

## 2023-04-03 MED ORDER — LACTATED RINGERS IV SOLN: INTRAVENOUS | Status: DC

## 2023-04-03 MED ORDER — DEXTROSE IN LACTATED RINGERS 5 % IV SOLN: INTRAVENOUS | Status: DC

## 2023-04-03 NOTE — ED Notes (Signed)
ENDO tool showed continue insulin gtt at 8.

## 2023-04-03 NOTE — Assessment & Plan Note (Signed)
Hold home ACE inhibitor until tolerating diet

## 2023-04-03 NOTE — Hospital Course (Addendum)
Zachary Vazquez is a 37 y.o. male who was admitted to the [facility] Medicine Teaching Service at [facility] for DKA. Hospital course is outlined below by problem.   DKA associated with T1DM Presented on 6/6 with nausea, vomiting, diarrhea with hyperglycemia, elevated ketones, increased anion gap, and pseudohyponatremia corrected to 134.  Likely triggered by viral gastroenteritis.  Reported good compliance with insulin regimen at home.  He was placed on insulin infusion per Endo tool.  He was transitioned off insulin drip on***.  AKI, hyperbilirubinemia Elevated creatinine on admission to 1.85 and elevated total bilirubin 2.6, both likely secondary to dehydration in setting of DKA.  Both values improved with IV fluids by discharge***.  Other conditions that were chronic and stable: Hypertension (held home ACE inhibitor until tolerated diet***)  Issues for follow up: ***

## 2023-04-03 NOTE — ED Provider Notes (Addendum)
[place] EMERGENCY DEPARTMENT AT [facility] Provider Note   CSN: [id number] Arrival date & time: 04/03/23  4098     History  Diabetes I Tobacco use Mallory-Weiss tear   Chief Complaint  Patient presents with   Nausea    N/V    Zachary Vazquez is a 37 y.o. male with a past medical history of diabetes I and tobacco use who presents with nausea and abdominal pain.  Patient developed nausea and abdominal pain about 1.5 days ago. States that he has type one diabetes and is here for treatment of DKA. He has been hospitalized multiple times for DKA in the past and states that his symptoms today are very similar. Associated symptom include intractable vomiting. Reports regular adherence to his home insulin regimen. Poor oral intake including fluids over last two days due to severe abdominal pain, nausea, and vomiting. Denies fever, chills, cough, hematemesis, breath shortness, recent illnesses, and chest pain.    Home Medications Prior to Admission medications   Medication Sig Start Date End Date Taking? Authorizing Provider  Blood Glucose Monitoring Suppl KIT by Does not apply route 3 (three) times daily.    [provider]  Continuous Blood Gluc Receiver (FREESTYLE LIBRE 2 READER) DEVI 1 application. by Does not apply route every 14 (fourteen) days. Patient not taking: Reported on 04/12/2022 01/10/22   Barbette Merino, NP  Continuous Blood Gluc Sensor (FREESTYLE LIBRE 2 SENSOR) MISC Use to check glucose Patient not taking: Reported on 12/18/2022 08/21/22   Ivonne Andrew, NP  insulin aspart (NOVOLOG) 100 UNIT/ML injection Use as directed per sliding scale 07/15/22   Ivonne Andrew, NP  insulin glargine (LANTUS) 100 UNIT/ML injection Inject 0.35 mLs (35 Units total) into the skin once nightly at bedtime. 04/12/22   [place], Enid Derry I, NP  lisinopril (ZESTRIL) 10 MG tablet Take 1 tablet (10 mg total) by mouth daily. 01/28/23   Ivonne Andrew, NP      Allergies     Clindamycin/lincomycin and Bactrim [sulfamethoxazole-trimethoprim]    Review of Systems   Review of Systems  Abdominal pain, nausea, vomiting   Physical Exam Updated Vital Signs BP (!) 144/72   Pulse (!) 115   Temp 98.5 F (36.9 C) (Oral)   Resp 12   Ht 5\' 11"  (1.803 m)   Wt 78.7 kg   SpO2 100%   BMI 24.20 kg/m  Physical Exam  Awake and alert, fully oriented, lethargic and tired appearing, actively vomiting Tachycardic rate and regular rhythm, normal S1 and S2, no murmurs or gallops Soft and non-distended abdomen, normoactive bowel sounds, diffuse tenderness to palpation   ED Results / Procedures / Treatments   Labs (all labs ordered are listed, but only abnormal results are displayed) Labs Reviewed  COMPREHENSIVE METABOLIC PANEL - Abnormal; Notable for the following components:      Result Value   Sodium 125 (*)    Potassium 5.3 (*)    Chloride 85 (*)    CO2 14 (*)    Glucose, Bld 460 (*)    BUN 30 (*)    Creatinine, Ser 1.85 (*)    Total Protein 6.3 (*)    Total Bilirubin 2.6 (*)    GFR, Estimated 48 (*)    Anion gap 26 (*)    All other components within normal limits  BETA-HYDROXYBUTYRIC ACID - Abnormal; Notable for the following components:   Beta-Hydroxybutyric Acid 6.39 (*)    All other components within normal limits  CBC WITH DIFFERENTIAL/PLATELET - Abnormal; Notable for the following components:   WBC 16.1 (*)    Neutro Abs 14.8 (*)    Abs Immature Granulocytes 0.11 (*)    All other components within normal limits  URINALYSIS, ROUTINE W REFLEX MICROSCOPIC - Abnormal; Notable for the following components:   Glucose, UA >=500 (*)    Hgb urine dipstick SMALL (*)    Ketones, ur 80 (*)    Protein, ur 30 (*)    All other components within normal limits  CBG MONITORING, ED - Abnormal; Notable for the following components:   Glucose-Capillary 419 (*)    All other components within normal limits  I-STAT VENOUS BLOOD GAS, ED - Abnormal; Notable for the  following components:   pCO2, Ven 23.8 (*)    pO2, Ven 68 (*)    Bicarbonate 15.5 (*)    TCO2 16 (*)    Acid-base deficit 7.0 (*)    Sodium 122 (*)    Calcium, Ion 1.03 (*)    All other components within normal limits  BASIC METABOLIC PANEL  BASIC METABOLIC PANEL  BETA-HYDROXYBUTYRIC ACID  I-STAT BETA HCG BLOOD, ED ([facility], [facility], [facility] ONLY)  CBG MONITORING, ED    EKG None  Radiology No results found.  Procedures Procedures   None   Medications Ordered in ED Medications  insulin regular, human (MYXREDLIN) 100 units/ 100 mL infusion (8 Units/hr Intravenous New Bag/Given 04/03/23 0938)  lactated ringers infusion ( Intravenous New Bag/Given 04/03/23 0941)  dextrose 5 % in lactated ringers infusion ( Intravenous New Bag/Given 04/03/23 0947)  dextrose 50 % solution 0-50 mL (has no administration in time range)  lactated ringers bolus 2,000 mL (1,000 mLs Intravenous New Bag/Given 04/03/23 0809)  metoCLOPramide (REGLAN) injection 10 mg (10 mg Intravenous Given 04/03/23 0810)  diphenhydrAMINE (BENADRYL) injection 25 mg (25 mg Intravenous Given 04/03/23 0810)  prochlorperazine (COMPAZINE) injection 10 mg (10 mg Intravenous Given 04/03/23 0915)  diphenhydrAMINE (BENADRYL) injection 25 mg (25 mg Intravenous Given 04/03/23 0915)    ED Course/ Medical Decision Making/ A&P   {   Click here for ABCD2, HEART and other calculator                         Medical Decision Making Amount and/or Complexity of Data Reviewed Labs: ordered.  Risk Prescription drug management. Decision regarding hospitalization.   Patient presents with nausea, vomiting, and abdominal pain that have persisted for the last 1.5 days. History notable for diabetes I and multiple hospitalizations for diabetic ketoacidosis. Reports regular adherence to home insulin regimen and denies symptoms suggestive of an acute infectious process. Laboratory testing upon arrival demonstrated Gluc 460, Bicarb 15.5, AG 26, and K 5.1. Presentation  consistent with diabetic ketoacidosis. Treatment with intravenous fluids and insulin was initiated in the ED. Plan to admit patient for continuous fluids-insulin and further monitoring of anion gap. Case signed out to family medicine teaching service.   Final Clinical Impression(s) / ED Diagnoses Final diagnoses:  Diabetic ketoacidosis without coma associated with type 1 diabetes mellitus [facility])    Rx / DC Orders ED Discharge Orders     None         Crissie Sickles, MD 04/03/23 1036    Crissie Sickles, MD 04/03/23 1038    Melene Plan, DO 04/03/23 1258

## 2023-04-03 NOTE — Progress Notes (Signed)
FMTS Interim Progress Note  S: Remains nauseous with abdominal discomfort around 4-10, down from previous 10-10 with Zofran.  O: BP (!) 153/96 (BP Location: Right Arm)   Pulse (!) 106   Temp 99.4 F (37.4 C) (Oral)   Resp 18   Ht 5\' 11"  (1.803 m)   Wt 78.7 kg   SpO2 98%   BMI 24.20 kg/m   General: Alert and oriented, in NAD HEENT: NCAT, EOM grossly normal, midline nasal septum Cardiac: RRR, no m/r/g appreciated Respiratory: CTAB, breathing and speaking comfortably on RA Abdominal: Soft, nontender, nondistended, normoactive bowel sounds Extremities: Moves all extremities grossly equally in bed Neurological: No gross focal deficit Psychiatric: Depressed mood, flat affect   A/P: DKA Likely precipitant viral gastroenteritis.  Anion gap now 10, down from 13.  Recent CBGs 161, 144, 114.  He is on D5 LR with insulin infusion.  Hopefully transition once anion gap closes x 2.  Next BMP check at 12 AM.  Continue insulin infusion for now.  Continue Zofran for nausea as needed.  AKI Creatinine improved to 1.55, down from 1.89 on admission.  Likely prerenal in setting of dehydration with DKA.  Continue IV fluids as above.  Remainder per day team note.  Janeal Holmes, MD 04/03/2023, 10:37 PM PGY-1, [facility] Family Medicine Service pager [phone]

## 2023-04-03 NOTE — Assessment & Plan Note (Addendum)
Patient presenting with nausea, vomiting, diarrhea with hyperglycemia, elevated ketones, and increased anion gap consistent with DKA.  Reports good adherence to prescribed insulin regimen, but possibly be triggered by underlying gastrointestinal illness given diarrhea.  Treatment has been initiated with continuous insulin infusion. - admit to FMTS, progressive, Jennette Kettle attending - continue insulin gtt per Endotool protocol, plan to transition off once glucose is improved and anion gap is closed - monitor CBG per Endotool - BMP q4h - s/p LR 2L bolus, continue fluid resuscitation per Endotool protocol - IV Zofran prn

## 2023-04-03 NOTE — Assessment & Plan Note (Addendum)
Isolated elevated total bilirubin 2.6, possibly related to dehydration.  Will check fractionated bilirubin, consider further workup depending on clinical concerns.

## 2023-04-03 NOTE — Assessment & Plan Note (Signed)
Secondary to severe hyperglycemia, Na corrects to 134

## 2023-04-03 NOTE — ED Triage Notes (Signed)
Per EMS and pt report, pt has been nauseous and vomiting for 3 days. Last night he started seeing blood in the emesis. Has a hx of type I dm.

## 2023-04-03 NOTE — ED Notes (Signed)
ED TO INPATIENT HANDOFF REPORT  ED Nurse Name and Phone #: Vernona Rieger  [phone]  S Name/Age/Gender Zachary Vazquez 37 y.o. male Room/Bed: 020C/020C  Code Status   Code Status: Full Code  Home/SNF/Other Home Patient oriented to: self, place, time, and situation Is this baseline? Yes   Triage Complete: Triage complete  Chief Complaint Diabetic ketoacidosis associated with type 1 diabetes mellitus (HCC) [E10.10]  Triage Note Per EMS and pt report, pt has been nauseous and vomiting for 3 days. Last night he started seeing blood in the emesis. Has a hx of type I dm.    Allergies Allergies  Allergen Reactions   Clindamycin/Lincomycin Anaphylaxis   Bactrim [Sulfamethoxazole-Trimethoprim] Swelling and Other (See Comments)    Eyes swell and bumps appear on/near the eyes     Level of Care/Admitting Diagnosis ED Disposition     ED Disposition  Admit   Condition  --   Comment  Hospital Area: [facility] [[id number]]  Level of Care: Progressive [102]  Admit to Progressive based on following criteria: GI, ENDOCRINE disease patients with GI bleeding, acute liver failure or pancreatitis, stable with diabetic ketoacidosis or thyrotoxicosis (hypothyroid) state.  May place patient in observation at [facility] or [facility] if equivalent level of care is available:: Yes  Covid Evaluation: Asymptomatic - no recent exposure (last 10 days) testing not required  Diagnosis: Diabetic ketoacidosis associated with type 1 diabetes mellitus [facility]) [[id number]]  Admitting Physician: Littie Deeds [phone]  Attending Physician: Nestor Ramp [[id number]]          B Medical/Surgery History Past Medical History:  Diagnosis Date   History of delayed wound healing 10/2019   Rash and nonspecific skin eruption 10/2019   Type 1 diabetes mellitus (HCC)    Vitamin D deficiency 09/2020   Wound of lower extremity, left, sequela 10/2019   Past Surgical History:  Procedure Laterality Date    TONSILLECTOMY       A IV Location/Drains/Wounds Patient Lines/Drains/Airways Status     Active Line/Drains/Airways     Name Placement date Placement time Site Days   Peripheral IV 04/03/23 20 G Anterior;Left Forearm 04/03/23  0744  Forearm  less than 1   Peripheral IV 04/03/23 20 G Left Antecubital 04/03/23  0935  Antecubital  less than 1            Intake/Output Last 24 hours  Intake/Output Summary (Last 24 hours) at 04/03/2023 1229 Last data filed at 04/03/2023 1129 Gross per 24 hour  Intake 3000 ml  Output --  Net 3000 ml    Labs/Imaging Results for orders placed or performed during the hospital encounter of 04/03/23 (from the past 48 hour(s))  Urinalysis, Routine w reflex microscopic -Urine, Clean Catch     Status: Abnormal   Collection Time: 04/03/23  7:53 AM  Result Value Ref Range   Color, Urine YELLOW YELLOW   APPearance CLEAR CLEAR   Specific Gravity, Urine 1.017 1.005 - 1.030   pH 5.0 5.0 - 8.0   Glucose, UA >=500 (A) NEGATIVE mg/dL   Hgb urine dipstick SMALL (A) NEGATIVE   Bilirubin Urine NEGATIVE NEGATIVE   Ketones, ur 80 (A) NEGATIVE mg/dL   Protein, ur 30 (A) NEGATIVE mg/dL   Nitrite NEGATIVE NEGATIVE   Leukocytes,Ua NEGATIVE NEGATIVE   RBC / HPF 0-5 0 - 5 RBC/hpf   WBC, UA 0-5 0 - 5 WBC/hpf   Bacteria, UA NONE SEEN NONE SEEN   Squamous Epithelial / HPF 0-5 0 -  5 /HPF   Hyaline Casts, UA PRESENT     Comment: Performed at [facility], [address] [place], [place] [address]  Comprehensive metabolic panel     Status: Abnormal   Collection Time: 04/03/23  8:17 AM  Result Value Ref Range   Sodium 125 (L) 135 - 145 mmol/L   Potassium 5.3 (H) 3.5 - 5.1 mmol/L   Chloride 85 (L) 98 - 111 mmol/L   CO2 14 (L) 22 - 32 mmol/L   Glucose, Bld 460 (H) 70 - 99 mg/dL    Comment: Glucose reference range applies only to samples taken after fasting for at least 8 hours.   BUN 30 (H) 6 - 20 mg/dL   Creatinine, Ser 6.04 (H) 0.61 - 1.24 mg/dL   Calcium 9.1  8.9 - 54.0 mg/dL   Total Protein 6.3 (L) 6.5 - 8.1 g/dL   Albumin 4.0 3.5 - 5.0 g/dL   AST 23 15 - 41 U/L   ALT 23 0 - 44 U/L   Alkaline Phosphatase 54 38 - 126 U/L   Total Bilirubin 2.6 (H) 0.3 - 1.2 mg/dL   GFR, Estimated 48 (L) >60 mL/min    Comment: (NOTE) Calculated using the CKD-EPI Creatinine Equation (2021)    Anion gap 26 (H) 5 - 15    Comment: ELECTROLYTES REPEATED TO VERIFY Performed at [facility], [address] [place], [place] [address]   Beta-hydroxybutyric acid     Status: Abnormal   Collection Time: 04/03/23  8:17 AM  Result Value Ref Range   Beta-Hydroxybutyric Acid 6.39 (H) 0.05 - 0.27 mmol/L    Comment: RESULT CONFIRMED BY MANUAL DILUTION Performed at [facility], [address], [place], [place] [address]   CBC with Differential     Status: Abnormal   Collection Time: 04/03/23  8:17 AM  Result Value Ref Range   WBC 16.1 (H) 4.0 - 10.5 K/uL   RBC 4.45 4.22 - 5.81 MIL/uL   Hemoglobin 13.8 13.0 - 17.0 g/dL   HCT 95.6 21.3 - 08.6 %   MCV 88.5 80.0 - 100.0 fL   MCH 31.0 26.0 - 34.0 pg   MCHC 35.0 30.0 - 36.0 g/dL   RDW 57.8 46.9 - 62.9 %   Platelets 310 150 - 400 K/uL   nRBC 0.0 0.0 - 0.2 %   Neutrophils Relative % 91 %   Neutro Abs 14.8 (H) 1.7 - 7.7 K/uL   Lymphocytes Relative 6 %   Lymphs Abs 0.9 0.7 - 4.0 K/uL   Monocytes Relative 2 %   Monocytes Absolute 0.3 0.1 - 1.0 K/uL   Eosinophils Relative 0 %   Eosinophils Absolute 0.0 0.0 - 0.5 K/uL   Basophils Relative 0 %   Basophils Absolute 0.1 0.0 - 0.1 K/uL   Immature Granulocytes 1 %   Abs Immature Granulocytes 0.11 (H) 0.00 - 0.07 K/uL    Comment: Performed at [facility], [address], [place], [place] [address]  I-Stat beta hCG blood, ED ([facility], [facility], [facility] only)     Status: None   Collection Time: 04/03/23  8:22 AM  Result Value Ref Range   I-stat hCG, quantitative <5.0 <5 mIU/mL   Comment 3            Comment:   GEST. AGE      CONC.  (mIU/mL)   <=1 WEEK        5 - 50  2 WEEKS       50 - 500     3 WEEKS       100 - 10,000     4 WEEKS     1,000 - 30,000        MALE AND NON-PREGNANT MALE:     LESS THAN 5 mIU/mL   I-Stat venous blood gas, ED     Status: Abnormal   Collection Time: 04/03/23  8:34 AM  Result Value Ref Range   pH, Ven 7.421 7.25 - 7.43   pCO2, Ven 23.8 (L) 44 - 60 mmHg   pO2, Ven 68 (H) 32 - 45 mmHg   Bicarbonate 15.5 (L) 20.0 - 28.0 mmol/L   TCO2 16 (L) 22 - 32 mmol/L   O2 Saturation 94 %   Acid-base deficit 7.0 (H) 0.0 - 2.0 mmol/L   Sodium 122 (L) 135 - 145 mmol/L   Potassium 5.1 3.5 - 5.1 mmol/L   Calcium, Ion 1.03 (L) 1.15 - 1.40 mmol/L   HCT 42.0 39.0 - 52.0 %   Hemoglobin 14.3 13.0 - 17.0 g/dL   Sample type VENOUS   CBG monitoring, ED     Status: Abnormal   Collection Time: 04/03/23  9:30 AM  Result Value Ref Range   Glucose-Capillary 419 (H) 70 - 99 mg/dL    Comment: Glucose reference range applies only to samples taken after fasting for at least 8 hours.  CBG monitoring, ED     Status: Abnormal   Collection Time: 04/03/23 11:32 AM  Result Value Ref Range   Glucose-Capillary 381 (H) 70 - 99 mg/dL    Comment: Glucose reference range applies only to samples taken after fasting for at least 8 hours.   No results found.  Pending Labs Unresulted Labs (From admission, onward)     Start     Ordered   04/10/23 0500  Creatinine, serum  (enoxaparin (LOVENOX)    CrCl >/= 30 ml/min)  Weekly,   R     Comments: while on enoxaparin therapy    04/03/23 1114   04/04/23 0500  HIV Antibody (routine testing w rflx)  (HIV Antibody (Routine testing w reflex) panel)  Tomorrow morning,   R        04/03/23 1114   04/04/23 0500  Basic metabolic panel  Tomorrow morning,   R        04/03/23 1114   04/04/23 0500  CBC  Tomorrow morning,   R        04/03/23 1114   04/04/23 0500  Bilirubin, fractionated(tot/dir/indir)  Tomorrow morning,   R        04/03/23 1114   04/03/23 1654  Basic metabolic panel  (Diabetes Ketoacidosis (DKA))  STAT Now  then every 4 hours ,   STAT      04/03/23 0855   04/03/23 1654  Beta-hydroxybutyric acid  (Diabetes Ketoacidosis (DKA))  Now then every 8 hours,   STAT (with URGENT occurrences)      04/03/23 0855   04/03/23 1118  Basic metabolic panel  ONCE - STAT,   STAT       Question:  Release to patient  Answer:  Immediate   04/03/23 1117            Vitals/Pain Today's Vitals   04/03/23 1145 04/03/23 1200 04/03/23 1215 04/03/23 1223  BP: (!) 152/75 (!) 140/76 135/74   Pulse: (!) 124 (!) 118 (!) 118   Resp: 10 (!) 8 10   Temp:  98.6 F (37 C)  TempSrc:    Oral  SpO2: 100% 100% 100%   Weight:      Height:      PainSc:        Isolation Precautions No active isolations  Medications Medications  insulin regular, human (MYXREDLIN) 100 units/ 100 mL infusion (8 Units/hr Intravenous New Bag/Given 04/03/23 0938)  lactated ringers infusion ( Intravenous New Bag/Given 04/03/23 0941)  dextrose 5 % in lactated ringers infusion ( Intravenous New Bag/Given 04/03/23 0947)  dextrose 50 % solution 0-50 mL (has no administration in time range)  enoxaparin (LOVENOX) injection 40 mg (has no administration in time range)  acetaminophen (TYLENOL) tablet 650 mg (has no administration in time range)    Or  acetaminophen (TYLENOL) suppository 650 mg (has no administration in time range)  lactated ringers bolus 2,000 mL (0 mLs Intravenous Stopped 04/03/23 1129)  metoCLOPramide (REGLAN) injection 10 mg (10 mg Intravenous Given 04/03/23 0810)  diphenhydrAMINE (BENADRYL) injection 25 mg (25 mg Intravenous Given 04/03/23 0810)  prochlorperazine (COMPAZINE) injection 10 mg (10 mg Intravenous Given 04/03/23 0915)  diphenhydrAMINE (BENADRYL) injection 25 mg (25 mg Intravenous Given 04/03/23 0915)    Mobility walks     Focused Assessments N/V currently stopped after compazine and fluids.    R Recommendations: See Admitting Provider Note  Report given to:   Additional Notes: Continent and using urinal.

## 2023-04-03 NOTE — Inpatient Diabetes Management (Signed)
Inpatient Diabetes Program Recommendations  AACE/ADA: New Consensus Statement on Inpatient Glycemic Control (2015)  Target Ranges:  Prepandial:   less than 140 mg/dL      Peak postprandial:   less than 180 mg/dL (1-2 hours)      Critically ill patients:  140 - 180 mg/dL   Lab Results  Component Value Date   GLUCAP 308 (H) 04/03/2023   HGBA1C 6.4 (A) 12/18/2022    Review of Glycemic Control  Latest Reference Range & Units 04/03/23 09:30 04/03/23 11:32 04/03/23 12:45  Glucose-Capillary 70 - 99 mg/dL 161 (H) 096 (H) 045 (H)  (H): Data is abnormally high Diabetes history: Type 1 DM Outpatient Diabetes medications: Novolog TID SSI, Lantus 28 units QHS Current orders for Inpatient glycemic control: IV insulin  Inpatient Diabetes Program Recommendations:    A1C ? Secure chat sent to MD. When ready to transition, consider transitioning to: - Semglee 22 units QHS -Novolog 3 units TID (assuming patient consuming >50% of meals) -Novolog 0-9 units TID & HS   Spoke with patient regarding outpatient diabetes management and DKA. Patient has insulin and supplies and attempted to administer Q4H correction to prevent DKA through N/V.  Per Care everywhere, previous A1C values have been at goal. Explained what a A1c is and what it measures. Also reviewed goal A1c with patient, importance of good glucose control @ home, and blood sugar goals. Reviewed patho of DM, need for insulin, DKA, vascular changes and commorbidities.  Has meter at home and uses this frequently. Has no further questions at this time.   Thanks, Lujean Rave, MSN, RNC-OB Diabetes Coordinator [phone] (8a-5p)

## 2023-04-03 NOTE — Assessment & Plan Note (Signed)
Elevated creatinine on admission 1.85, likely prerenal secondary to dehydration.  Monitor labs, continue IV fluid resuscitation as above.

## 2023-04-03 NOTE — H&P (Addendum)
Hospital Admission History and Physical Service Pager: [phone]  Patient name: Zachary Vazquez Medical record number: [id number] Date of Birth: 1986/07/25 Age: 37 y.o. Gender: male  Primary Care Provider: Ivonne Andrew, NP Consultants: None Code Status: Full code Preferred Emergency Contact: Mother Contact Information     Name Relation Home Work Mobile   [place] Mother [phone]          Chief Complaint: Vomiting  Assessment and Plan: Zachary Vazquez is a 37 y.o. male presenting with nausea, vomiting, and diarrhea likely secondary to DKA. Differential for presentation of this includes DKA, viral gastroenteritis, pancreatitis.   Active Hospital Problems   *Diabetic ketoacidosis associated with type 1 diabetes mellitus (HCC)           Patient presenting with nausea, vomiting, diarrhea           with hyperglycemia, elevated ketones, and           increased anion gap consistent with DKA.  Reports           good adherence to prescribed insulin regimen, but           possibly be triggered by underlying           gastrointestinal illness given diarrhea.            Treatment has been initiated with continuous           insulin infusion.           - admit to FMTS, progressive, Zachary Vazquez attending           - continue insulin gtt per Endotool protocol, plan           to transition off once            glucose is improved and anion gap is closed           - monitor CBG per Endotool           - BMP q4h           - s/p LR 2L bolus, continue fluid resuscitation           per Endotool protocol           - IV Zofran prn    Pseudohyponatremia           Secondary to severe hyperglycemia, Na corrects to           134    Hyperbilirubinemia           Isolated elevated total bilirubin 2.6, possibly           related to dehydration.  Will check fractionated           bilirubin, consider further workup depending on           clinical concerns.    AKI (acute kidney  injury) (HCC)           Elevated creatinine on admission 1.85, likely           prerenal secondary to dehydration.  Monitor labs,           continue IV fluid resuscitation as above.   Resolved Hospital Problems No resolved problems to display.  HTN-hold home ACE inhibitor until tolerating diet   FEN/GI: N.p.o. while on insulin infusion VTE Prophylaxis: Enoxaparin  Disposition: Admit to progressive floor  History of Present Illness:  Zachary Vazquez is a 36 y.o. male presenting with nausea and vomiting.  Patient  started feeling ill about a day and a half ago with nausea, vomiting, abdominal discomfort, and diarrhea.  Denies any abdominal pain currently.  Denies fever, chills, cough, congestion, recent illnesses. Reports that he has been taking his insulin as prescribed (Lantus 28 units daily, sliding scale insulin 3 times a day with meals). No known sick contacts.  No recent travel.  Denies suspicious food intake.  In the ED, patient noted to be tachycardic and tachypneic.  Labs obtained notable for bicarb 14, anion gap 26, glucose 416, urine ketones 80, beta hydroxy butyrate 6.39.  Review Of Systems: Per HPI with the following additions:  Denies fever, chills.  Pertinent Past Medical History: T1DM, HTN Remainder reviewed in history tab.   Pertinent Past Surgical History:   Remainder reviewed in history tab.  Pertinent Social History: Tobacco use: Yes/No/Former Alcohol use: Denies Other Substance use: Denies Lives with mother.  Not currently working.  Pertinent Family History: T1DM in brother  Remainder reviewed in history tab.   Important Outpatient Medications: Lantus 20 units nightly NovoLog sliding scale 3 times a day with meals Lisinopril 10 mg Remainder reviewed in medication history.   Objective: BP 135/78   Pulse (!) 119   Temp 98.6 F (37 C) (Oral)   Resp 17   Ht 5\' 11"  (1.803 m)   Wt 78.7 kg   SpO2 100%   BMI 24.20 kg/m  Exam: General:  Somnolent but interactive, uncomfortable. ENTM: MMM Neck: Supple, no LAD Cardiovascular: Tachycardic, regular rhythm, no murmurs Respiratory: Clear to auscultation bilaterally, no respiratory distress Gastrointestinal: Soft, nontender, active bowel sounds MSK: Moves all extremities Derm: Warm, dry Neuro: Somnolent but interactive, follows commands Psych: Affect appropriate  Labs:  CBC BMET  Recent Labs  Lab 04/03/23 0817 04/03/23 0834  WBC 16.1*  --   HGB 13.8 14.3  HCT 39.4 42.0  PLT 310  --    Recent Labs  Lab 04/03/23 1055  NA 126*  K 4.5  CL 90*  CO2 18*  BUN 28*  CREATININE 1.89*  GLUCOSE 422*  CALCIUM 8.7*    Pertinent additional labs bicarb 14, anion gap 26, glucose 416, urine ketones 80, beta hydroxy butyrate 6.39..  EKG: My own interpretation (not copied from electronic read) not available, EKG ordered   Imaging Studies Performed: None   Littie Deeds, MD 04/03/2023, 12:56 PM PGY-3, [facility]  FPTS Intern pager: [phone], text pages welcome Secure chat group [facility] Teaching Service

## 2023-04-04 ENCOUNTER — Other Ambulatory Visit: Payer: Self-pay

## 2023-04-04 DIAGNOSIS — R112 Nausea with vomiting, unspecified: Secondary | ICD-10-CM

## 2023-04-04 LAB — BASIC METABOLIC PANEL
Anion gap: 9 (ref 5–15)
Anion gap: 8 (ref 5–15)
Anion gap: 8 (ref 5–15)
BUN: 23 mg/dL — ABNORMAL HIGH (ref 6–20)
BUN: 17 mg/dL (ref 6–20)
BUN: 13 mg/dL (ref 6–20)
CO2: 28 mmol/L (ref 22–32)
CO2: 27 mmol/L (ref 22–32)
CO2: 27 mmol/L (ref 22–32)
Calcium: 9.4 mg/dL (ref 8.9–10.3)
Calcium: 9 mg/dL (ref 8.9–10.3)
Calcium: 9 mg/dL (ref 8.9–10.3)
Chloride: 99 mmol/L (ref 98–111)
Chloride: 101 mmol/L (ref 98–111)
Chloride: 101 mmol/L (ref 98–111)
Creatinine, Ser: 1.53 mg/dL — ABNORMAL HIGH (ref 0.61–1.24)
Creatinine, Ser: 1.36 mg/dL — ABNORMAL HIGH (ref 0.61–1.24)
Creatinine, Ser: 1.42 mg/dL — ABNORMAL HIGH (ref 0.61–1.24)
GFR, Estimated: >60 mL/min (ref >60)
GFR, Estimated: >60 mL/min (ref >60)
GFR, Estimated: >60 mL/min (ref >60)
Glucose, Bld: 144 mg/dL — ABNORMAL HIGH (ref 70–99)
Glucose, Bld: 159 mg/dL — ABNORMAL HIGH (ref 70–99)
Glucose, Bld: 174 mg/dL — ABNORMAL HIGH (ref 70–99)
Potassium: 3.6 mmol/L (ref 3.5–5.1)
Potassium: 3.7 mmol/L (ref 3.5–5.1)
Potassium: 3.6 mmol/L (ref 3.5–5.1)
Sodium: 136 mmol/L (ref 135–145)
Sodium: 136 mmol/L (ref 135–145)
Sodium: 136 mmol/L (ref 135–145)

## 2023-04-04 LAB — CBC
HCT: 37.8 % — ABNORMAL LOW (ref 39.0–52.0)
Hemoglobin: 13.1 g/dL (ref 13.0–17.0)
MCH: 31 pg (ref 26.0–34.0)
MCHC: 34.7 g/dL (ref 30.0–36.0)
MCV: 89.6 fL (ref 80.0–100.0)
Platelets: 282 10*3/uL (ref 150–400)
RBC: 4.22 MIL/uL (ref 4.22–5.81)
RDW: 12.2 % (ref 11.5–15.5)
WBC: 17.2 10*3/uL — ABNORMAL HIGH (ref 4.0–10.5)
nRBC: 0 % (ref 0.0–0.2)

## 2023-04-04 LAB — GLUCOSE, CAPILLARY
Glucose-Capillary: 139 mg/dL — ABNORMAL HIGH (ref 70–99)
Glucose-Capillary: 146 mg/dL — ABNORMAL HIGH (ref 70–99)
Glucose-Capillary: 147 mg/dL — ABNORMAL HIGH (ref 70–99)
Glucose-Capillary: 147 mg/dL — ABNORMAL HIGH (ref 70–99)
Glucose-Capillary: 152 mg/dL — ABNORMAL HIGH (ref 70–99)
Glucose-Capillary: 156 mg/dL — ABNORMAL HIGH (ref 70–99)
Glucose-Capillary: 156 mg/dL — ABNORMAL HIGH (ref 70–99)
Glucose-Capillary: 157 mg/dL — ABNORMAL HIGH (ref 70–99)
Glucose-Capillary: 164 mg/dL — ABNORMAL HIGH (ref 70–99)
Glucose-Capillary: 164 mg/dL — ABNORMAL HIGH (ref 70–99)
Glucose-Capillary: 165 mg/dL — ABNORMAL HIGH (ref 70–99)
Glucose-Capillary: 177 mg/dL — ABNORMAL HIGH (ref 70–99)
Glucose-Capillary: 292 mg/dL — ABNORMAL HIGH (ref 70–99)

## 2023-04-04 LAB — BILIRUBIN, FRACTIONATED(TOT/DIR/INDIR)
Bilirubin, Direct: 0.2 mg/dL (ref 0.0–0.2)
Indirect Bilirubin: 0.9 mg/dL (ref 0.3–0.9)
Total Bilirubin: 1.1 mg/dL (ref 0.3–1.2)

## 2023-04-04 LAB — LIPASE, BLOOD: Lipase: 22 U/L (ref 11–51)

## 2023-04-04 LAB — BETA-HYDROXYBUTYRIC ACID: Beta-Hydroxybutyric Acid: 0.9 mmol/L — ABNORMAL HIGH (ref 0.05–0.27)

## 2023-04-04 LAB — HIV ANTIBODY (ROUTINE TESTING W REFLEX): HIV Screen 4th Generation wRfx: NONREACTIVE

## 2023-04-04 MED ORDER — SCOPOLAMINE 1 MG/3DAYS TD PT72
1.0000 | MEDICATED_PATCH | TRANSDERMAL | Status: DC
  Administered 2023-04-04 – 2023-04-07 (×2): 1.5 mg via TRANSDERMAL
  Filled 2023-04-04 (×2): qty 1

## 2023-04-04 MED ORDER — PANTOPRAZOLE SODIUM 40 MG IV SOLR
40.0000 mg | Freq: Two times a day (BID) | INTRAVENOUS | Status: DC
  Administered 2023-04-04 – 2023-04-08 (×8): 40 mg via INTRAVENOUS
  Filled 2023-04-04 (×8): qty 10

## 2023-04-04 MED ORDER — SODIUM CHLORIDE 0.9 % IV SOLN
12.5000 mg | Freq: Once | INTRAVENOUS | Status: AC
  Administered 2023-04-04: 12.5 mg via INTRAVENOUS
  Filled 2023-04-04: qty 12.5

## 2023-04-04 MED ORDER — METOCLOPRAMIDE HCL 5 MG/ML IJ SOLN
10.0000 mg | Freq: Once | INTRAMUSCULAR | Status: AC
  Administered 2023-04-04: 10 mg via INTRAVENOUS
  Filled 2023-04-04: qty 2

## 2023-04-04 MED ORDER — SODIUM CHLORIDE 0.9 % IV SOLN
25.0000 mg | Freq: Once | INTRAVENOUS | Status: AC
  Administered 2023-04-04: 25 mg via INTRAVENOUS
  Filled 2023-04-04: qty 1

## 2023-04-04 MED ORDER — INSULIN GLARGINE-YFGN 100 UNIT/ML ~~LOC~~ SOLN: 14.0000 [IU] | Freq: Every day | SUBCUTANEOUS | Status: DC

## 2023-04-04 MED ORDER — CAPSAICIN 0.025 % EX CREA
TOPICAL_CREAM | Freq: Two times a day (BID) | CUTANEOUS | Status: DC
  Filled 2023-04-04: qty 60

## 2023-04-04 MED ORDER — NICOTINE 14 MG/24HR TD PT24
14.0000 mg | MEDICATED_PATCH | Freq: Every day | TRANSDERMAL | Status: DC
  Administered 2023-04-04 – 2023-04-07 (×3): 14 mg via TRANSDERMAL
  Filled 2023-04-04 (×4): qty 1

## 2023-04-04 MED ORDER — PROCHLORPERAZINE EDISYLATE 10 MG/2ML IJ SOLN
10.0000 mg | Freq: Once | INTRAMUSCULAR | Status: DC
  Filled 2023-04-04: qty 2

## 2023-04-04 MED ORDER — SODIUM CHLORIDE 0.9 % IV SOLN
8.0000 mg | Freq: Three times a day (TID) | INTRAVENOUS | Status: DC
  Administered 2023-04-04 – 2023-04-05 (×6): 8 mg via INTRAVENOUS
  Filled 2023-04-04 (×8): qty 4

## 2023-04-04 MED ORDER — LISINOPRIL 10 MG PO TABS
10.0000 mg | ORAL_TABLET | Freq: Every day | ORAL | Status: DC
  Administered 2023-04-04 – 2023-04-06 (×2): 10 mg via ORAL
  Filled 2023-04-04 (×2): qty 1

## 2023-04-04 MED ORDER — KCL-LACTATED RINGERS-D5W 20 MEQ/L IV SOLN
INTRAVENOUS | Status: DC
  Filled 2023-04-04 (×4): qty 1000

## 2023-04-04 MED ORDER — INSULIN ASPART 100 UNIT/ML IJ SOLN
0.0000 [IU] | Freq: Three times a day (TID) | INTRAMUSCULAR | Status: DC
  Administered 2023-04-04: 2 [IU] via SUBCUTANEOUS

## 2023-04-04 MED ORDER — INSULIN GLARGINE-YFGN 100 UNIT/ML ~~LOC~~ SOLN
14.0000 [IU] | Freq: Every day | SUBCUTANEOUS | Status: DC
  Administered 2023-04-04: 14 [IU] via SUBCUTANEOUS
  Filled 2023-04-04 (×2): qty 0.14

## 2023-04-04 NOTE — Assessment & Plan Note (Addendum)
Tbili elevated on admission, has since normalized

## 2023-04-04 NOTE — Assessment & Plan Note (Addendum)
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 EKG overnight unchanged from prior. Hgb stable.  Lipase WNL. -scheduled zofran, scopolamine patch -protonix bid -nicotine patch -capsaicin cream bid -consider GI consult if acutely worsening

## 2023-04-04 NOTE — Progress Notes (Signed)
Daily Progress Note Intern Pager: [phone]  Patient name: Zachary Vazquez Medical record number: [id number] Date of birth: [birth year] Age: 37 y.o. Gender: male  Primary Care Provider: Ivonne Andrew, NP Consultants: None Code Status: Full  Pt Overview and Major Events to Date:  6/6 admitted, started Endo tool 6/7 transitioning off of Endo tool  Assessment and Plan:  Regina Coppolino is a 37 y.o. male p/w N/V 2/2 DKA. Likely in the setting of gastroenteritis.  Active Hospital Problems   *Diabetic ketoacidosis associated with type 1 diabetes mellitus (HCC)           Possibly precipitated by viral gastroenteritis.           Anion gap closed x2, CBGs <200. Had significant           nausea/vomiting overnight but has improved this           morning, willing to start advancing diet.           -Advance diet as tolerated           -Start Semglee 14u if tolerating PO, then dc           insulin gtt and dextrose            containing fluids after 2 hours           -Sch zofran, scopolamine patch           -Monitor BMP    Nausea and vomiting           Likely 2/2 DKA, but also considering his hx of           marijuana/tobacco use and hx of mallory weiss           tears. EKG overnight unchanged from prior. Hgb           stable.  Lipase WNL.           -scheduled zofran, scopolamine patch           -protonix bid           -nicotine patch           -capsaicin cream bid           -consider GI consult if acutely worsening    Hyperbilirubinemia           Tbili elevated on admission, has since normalized    Hypertension associated with diabetes (HCC)           Hold home ACE inhibitor until tolerating diet    AKI (acute kidney injury) (HCC)           Cr improving in the setting of fluid           resuscitation. -Continue to monitor, anticipate           continued improvement   Resolved Hospital Problems No resolved problems to display.    FEN/GI: advance diet as  tolerated PPx: lovenox Dispo:Home pending clinical improvement . Barriers include management of DKA.   Subjective:  Was nauseous and vomited overnight, but feeling better this morning.  Feels like meds are working well for him.  Feels ready to start advancing his diet.  Denies pain.  Objective: Temp:  [97.8 F (36.6 C)-99.4 F (37.4 C)] 98.7 F (37.1 C) (06/07 1120) Pulse Rate:  [101-120] 101 (06/07 1120) Resp:  [18] 18 (06/07 0437) BP: (96-178)/(83-99) 156/95 (06/07 1120) SpO2:  [96 %-100 %] 100 % (06/07 1120) Weight:  [  76.9 kg] 76.9 kg (06/07 0437) Physical Exam: General: NAD, resting in bed Cardiovascular: Tachycardic, regular rhythm Respiratory: CTAB normal WOB on RA Abdomen: Soft NT/ND Extremities: No significant edema  Laboratory: Most recent CBC Lab Results  Component Value Date   WBC 17.2 (H) 04/04/2023   HGB 13.1 04/04/2023   HCT 37.8 (L) 04/04/2023   MCV 89.6 04/04/2023   PLT 282 04/04/2023   Most recent BMP    Latest Ref Rng & Units 04/04/2023    8:56 AM  BMP  Glucose 70 - 99 mg/dL 119   BUN 6 - 20 mg/dL 17   Creatinine 1.47 - 1.24 mg/dL 8.29   Sodium 562 - 130 mmol/L 136   Potassium 3.5 - 5.1 mmol/L 3.7   Chloride 98 - 111 mmol/L 101   CO2 22 - 32 mmol/L 27   Calcium 8.9 - 10.3 mg/dL 9.0     Lipase 22.   Vonna Drafts, MD 04/04/2023, 12:56 PM  PGY-1, [facility] FPTS Intern pager: [phone], text pages welcome Secure chat group [facility] Teaching Service

## 2023-04-04 NOTE — Progress Notes (Addendum)
Went to assess patient after RN message about refractory nausea and vomiting.  Patient last took Phenergan with some relief and ability to sleep.  However, he awoke with nausea and vomiting again.  Patient consistently tachycardic to 110s with elevated BP to 150-160 over 90s. No chest pain, shortness of breath, abdominal pain on my exam.  Solely endorses nausea at this time.  Had a couple episodes of emesis right before I arrived, though now feels as if he could dry heave.  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  Feel vitals are due to acute discomfort and retching.    Nausea and vomiting likely secondary to DKA, though anion gap has closed x 2.  Unlikely cardiac source given no chest pain, though will obtain repeat EKG (will also be helpful given he has had multiple medications that could prolong QTc).  Will also obtain lipase to assess for any pancreatitis, though less likely given no abdominal pain.  Follow a.m. CBC to assess for any hemoglobin changes in setting of patient's report of blood in mouth with vomiting.  Patient does have about a pack per day smoking history, with last cigarette 36 hours ago, and some element of nicotine withdrawal could be confounding-will add on nicotine patch 14 mg daily as well.  Also endorses marijuana use; will add on Zostrix cream twice daily to stomach for potential cannabinoid hyperemesis syndrome; do feel like this is less likely at this time given acute presentation, though he does have a suggestive history. For general symptomatic relief of nausea, will give next dose of IV Zofran 8 mg 30 minutes early and scopolamine patch.  Can repeat Compazine and Phenergan dosing as able.  Will also give Protonix for subjective bleeding.

## 2023-04-04 NOTE — Plan of Care (Signed)
  Problem: Health Behavior/Discharge Planning: Goal: Ability to manage health-related needs will improve Outcome: Progressing   

## 2023-04-04 NOTE — Assessment & Plan Note (Signed)
Cr improving in the setting of fluid resuscitation.  -Continue to monitor, anticipate continued improvement

## 2023-04-04 NOTE — Assessment & Plan Note (Addendum)
Possibly precipitated by viral gastroenteritis. Anion gap closed x2, CBGs <200. Had significant nausea/vomiting overnight but has improved this morning, willing to start advancing diet. -Advance diet as tolerated -Start Semglee 14u if tolerating PO, then dc insulin gtt and dextrose containing fluids after 2 hours -Sch zofran, scopolamine patch -Monitor BMP

## 2023-04-04 NOTE — Progress Notes (Addendum)
Went to assess patient for continued nausea and vomiting. He was doing well throughout the day according to RN, eating chicken noodle soup, crackers, etc. However, tonight, his nausea and vomiting have returned. He believes that phenergan has helped the most at this time. He has not stooled in 2 days. I broached the subject of his marijuana use; he strongly feels his marijuana use is not a contributor since he has been smoking for 20 years. Because of this, he has not tried the capsaicin cream.  My exam notable for hyperactive bowel sounds in all quadrants, no abdominal pain to palpation, normal cardiopulmonary exam, mild diaphoresis, active vomiting of small amounts of fluid. Mood appears depressed. He endorses feeling defeated and "ready to go when the good Shaune Pollack takes" him. However, he denies SI at this time.  Unlikely DKA is sole cause for his presentation given AG has been closed for close to 24 hours now. It is odd that he has been eating all day without symptoms until now, though could consider diabetic gastroparesis. This possibility has been raised before in 10/2021, and he was supposed follow up with GI; it does not appear this happened. Suspicion remains low for cardiac source (same pain with negative cardiopulmonary exam tonight and EKG last night), SBO (he has not stooled in 2 days, and stools were previously diarrhea; however, likely no stool production from no PO intake and diarrheal from DKA/?viral process), pancreatitis (lipase negative this AM).  Will continue to treat conservatively with increased dose phenergan 25 mg once (consider prn dosing if helps tonight). Can also consider reglan if phenergan not controlling symptoms. Continue scopolamine patch, nicotine patch. He will very likely need GI follow up at discharge for potential outpatient gastric emptying studies. Broached the idea of SW to help him with therapy resources for understandable mood disorder in context of health comorbidities,  though he declined for now.

## 2023-04-05 ENCOUNTER — Inpatient Hospital Stay (HOSPITAL_COMMUNITY): Payer: Self-pay

## 2023-04-05 DIAGNOSIS — R1114 Bilious vomiting: Secondary | ICD-10-CM

## 2023-04-05 DIAGNOSIS — N179 Acute kidney failure, unspecified: Secondary | ICD-10-CM

## 2023-04-05 LAB — BASIC METABOLIC PANEL
Anion gap: 17 — ABNORMAL HIGH (ref 5–15)
Anion gap: 15 (ref 5–15)
Anion gap: 9 (ref 5–15)
Anion gap: 10 (ref 5–15)
BUN: 16 mg/dL (ref 6–20)
BUN: 18 mg/dL (ref 6–20)
BUN: 16 mg/dL (ref 6–20)
BUN: 15 mg/dL (ref 6–20)
CO2: 20 mmol/L — ABNORMAL LOW (ref 22–32)
CO2: 23 mmol/L (ref 22–32)
CO2: 27 mmol/L (ref 22–32)
CO2: 26 mmol/L (ref 22–32)
Calcium: 8.8 mg/dL — ABNORMAL LOW (ref 8.9–10.3)
Calcium: 8.8 mg/dL — ABNORMAL LOW (ref 8.9–10.3)
Calcium: 8.9 mg/dL (ref 8.9–10.3)
Calcium: 8.9 mg/dL (ref 8.9–10.3)
Chloride: 96 mmol/L — ABNORMAL LOW (ref 98–111)
Chloride: 100 mmol/L (ref 98–111)
Chloride: 103 mmol/L (ref 98–111)
Chloride: 103 mmol/L (ref 98–111)
Creatinine, Ser: 1.69 mg/dL — ABNORMAL HIGH (ref 0.61–1.24)
Creatinine, Ser: 1.69 mg/dL — ABNORMAL HIGH (ref 0.61–1.24)
Creatinine, Ser: 1.38 mg/dL — ABNORMAL HIGH (ref 0.61–1.24)
Creatinine, Ser: 1.33 mg/dL — ABNORMAL HIGH (ref 0.61–1.24)
GFR, Estimated: 53 mL/min — ABNORMAL LOW (ref >60)
GFR, Estimated: 53 mL/min — ABNORMAL LOW (ref >60)
GFR, Estimated: >60 mL/min (ref >60)
GFR, Estimated: >60 mL/min (ref >60)
Glucose, Bld: 532 mg/dL (ref 70–99)
Glucose, Bld: 358 mg/dL — ABNORMAL HIGH (ref 70–99)
Glucose, Bld: 159 mg/dL — ABNORMAL HIGH (ref 70–99)
Glucose, Bld: 100 mg/dL — ABNORMAL HIGH (ref 70–99)
Potassium: 4.4 mmol/L (ref 3.5–5.1)
Potassium: 4.1 mmol/L (ref 3.5–5.1)
Potassium: 4 mmol/L (ref 3.5–5.1)
Potassium: 3.9 mmol/L (ref 3.5–5.1)
Sodium: 133 mmol/L — ABNORMAL LOW (ref 135–145)
Sodium: 138 mmol/L (ref 135–145)
Sodium: 139 mmol/L (ref 135–145)
Sodium: 139 mmol/L (ref 135–145)

## 2023-04-05 LAB — CBC WITH DIFFERENTIAL/PLATELET
Abs Immature Granulocytes: 0.04 10*3/uL (ref 0.00–0.07)
Basophils Absolute: 0 10*3/uL (ref 0.0–0.1)
Basophils Relative: 0 %
Eosinophils Absolute: 0 10*3/uL (ref 0.0–0.5)
Eosinophils Relative: 0 %
HCT: 36.8 % — ABNORMAL LOW (ref 39.0–52.0)
Hemoglobin: 12.8 g/dL — ABNORMAL LOW (ref 13.0–17.0)
Immature Granulocytes: 0 %
Lymphocytes Relative: 7 %
Lymphs Abs: 0.8 10*3/uL (ref 0.7–4.0)
MCH: 32.2 pg (ref 26.0–34.0)
MCHC: 34.8 g/dL (ref 30.0–36.0)
MCV: 92.5 fL (ref 80.0–100.0)
Monocytes Absolute: 0.5 10*3/uL (ref 0.1–1.0)
Monocytes Relative: 4 %
Neutro Abs: 10.4 10*3/uL — ABNORMAL HIGH (ref 1.7–7.7)
Neutrophils Relative %: 89 %
Platelets: 213 10*3/uL (ref 150–400)
RBC: 3.98 MIL/uL — ABNORMAL LOW (ref 4.22–5.81)
RDW: 12 % (ref 11.5–15.5)
WBC: 11.8 10*3/uL — ABNORMAL HIGH (ref 4.0–10.5)
nRBC: 0 % (ref 0.0–0.2)

## 2023-04-05 LAB — GLUCOSE, CAPILLARY
Glucose-Capillary: 477 mg/dL — ABNORMAL HIGH (ref 70–99)
Glucose-Capillary: 501 mg/dL (ref 70–99)
Glucose-Capillary: 403 mg/dL — ABNORMAL HIGH (ref 70–99)
Glucose-Capillary: 443 mg/dL — ABNORMAL HIGH (ref 70–99)
Glucose-Capillary: 335 mg/dL — ABNORMAL HIGH (ref 70–99)
Glucose-Capillary: 281 mg/dL — ABNORMAL HIGH (ref 70–99)
Glucose-Capillary: 259 mg/dL — ABNORMAL HIGH (ref 70–99)
Glucose-Capillary: 146 mg/dL — ABNORMAL HIGH (ref 70–99)
Glucose-Capillary: 141 mg/dL — ABNORMAL HIGH (ref 70–99)
Glucose-Capillary: 136 mg/dL — ABNORMAL HIGH (ref 70–99)
Glucose-Capillary: 119 mg/dL — ABNORMAL HIGH (ref 70–99)
Glucose-Capillary: 98 mg/dL (ref 70–99)
Glucose-Capillary: 137 mg/dL — ABNORMAL HIGH (ref 70–99)

## 2023-04-05 MED ORDER — PROCHLORPERAZINE EDISYLATE 10 MG/2ML IJ SOLN
10.0000 mg | Freq: Four times a day (QID) | INTRAMUSCULAR | Status: DC | PRN
  Administered 2023-04-05: 10 mg via INTRAVENOUS

## 2023-04-05 MED ORDER — PROMETHAZINE HCL 25 MG PO TABS: 25.0000 mg | ORAL_TABLET | Freq: Four times a day (QID) | ORAL | Status: DC | PRN

## 2023-04-05 MED ORDER — DEXTROSE 50 % IV SOLN: 0.0000 mL | INTRAVENOUS | Status: DC | PRN

## 2023-04-05 MED ORDER — DEXTROSE IN LACTATED RINGERS 5 % IV SOLN: INTRAVENOUS | Status: DC

## 2023-04-05 MED ORDER — INSULIN GLARGINE-YFGN 100 UNIT/ML ~~LOC~~ SOLN
14.0000 [IU] | SUBCUTANEOUS | Status: DC
  Administered 2023-04-05: 14 [IU] via SUBCUTANEOUS
  Filled 2023-04-05 (×2): qty 0.14

## 2023-04-05 MED ORDER — SODIUM CHLORIDE 0.9 % IV SOLN
25.0000 mg | Freq: Four times a day (QID) | INTRAVENOUS | Status: DC | PRN
  Filled 2023-04-05: qty 1

## 2023-04-05 MED ORDER — INSULIN ASPART 100 UNIT/ML IJ SOLN
0.0000 [IU] | Freq: Three times a day (TID) | INTRAMUSCULAR | Status: DC
  Administered 2023-04-06 (×2): 3 [IU] via SUBCUTANEOUS
  Administered 2023-04-06: 5 [IU] via SUBCUTANEOUS
  Administered 2023-04-07: 3 [IU] via SUBCUTANEOUS
  Administered 2023-04-07: 2 [IU] via SUBCUTANEOUS
  Administered 2023-04-07: 5 [IU] via SUBCUTANEOUS
  Administered 2023-04-08: 1 [IU] via SUBCUTANEOUS

## 2023-04-05 MED ORDER — LACTATED RINGERS IV BOLUS
1500.0000 mL | Freq: Once | INTRAVENOUS | Status: AC
  Administered 2023-04-05: 1500 mL via INTRAVENOUS

## 2023-04-05 MED ORDER — PROMETHAZINE HCL 12.5 MG PO TABS
12.5000 mg | ORAL_TABLET | Freq: Four times a day (QID) | ORAL | Status: DC
  Filled 2023-04-05 (×7): qty 1

## 2023-04-05 MED ORDER — INSULIN REGULAR(HUMAN) IN NACL 100-0.9 UT/100ML-% IV SOLN
INTRAVENOUS | Status: DC
  Administered 2023-04-05: 9 [IU]/h via INTRAVENOUS
  Filled 2023-04-05: qty 100

## 2023-04-05 MED ORDER — LACTATED RINGERS IV BOLUS: 20.0000 mL/kg | Freq: Once | INTRAVENOUS | Status: DC

## 2023-04-05 MED ORDER — SODIUM CHLORIDE 0.9 % IV SOLN
12.5000 mg | Freq: Four times a day (QID) | INTRAVENOUS | Status: DC
  Administered 2023-04-06 – 2023-04-07 (×6): 12.5 mg via INTRAVENOUS
  Filled 2023-04-05: qty 0.5
  Filled 2023-04-05: qty 12.5
  Filled 2023-04-05: qty 0.5
  Filled 2023-04-05 (×3): qty 12.5
  Filled 2023-04-05: qty 0.5
  Filled 2023-04-05: qty 12.5

## 2023-04-05 MED ORDER — LACTATED RINGERS IV SOLN: INTRAVENOUS | Status: DC

## 2023-04-05 MED ORDER — PROCHLORPERAZINE EDISYLATE 10 MG/2ML IJ SOLN
10.0000 mg | Freq: Four times a day (QID) | INTRAMUSCULAR | Status: DC | PRN
  Administered 2023-04-05 (×2): 10 mg via INTRAVENOUS
  Filled 2023-04-05 (×3): qty 2

## 2023-04-05 MED ORDER — POTASSIUM CHLORIDE 10 MEQ/100ML IV SOLN
10.0000 meq | INTRAVENOUS | Status: DC
Start: 2023-04-05 — End: 2023-04-05

## 2023-04-05 NOTE — Assessment & Plan Note (Signed)
Tbili elevated on admission, has since normalized with IVF

## 2023-04-05 NOTE — Plan of Care (Signed)
  Problem: Health Behavior/Discharge Planning: Goal: Ability to manage health-related needs will improve Outcome: Progressing   

## 2023-04-05 NOTE — Assessment & Plan Note (Addendum)
Could be due to DKA, though would expect symptoms to have improved with improved glucose control. Also considering his hx of intractable nausea vomiting and previous admissions, marijuana/tobacco use, and hx of mallory weiss tears. EKG over admission unchanged from prior. Hgb stable.  Lipase WNL. -scheduled zofran, scopolamine patch -Phenergan, compazine as needed -could consider addition of TCA for abortive and prophylactic n/v effects -protonix bid -nicotine patch -Offer capsaicin cream bid -consulted GI, appreciate recs

## 2023-04-05 NOTE — Assessment & Plan Note (Signed)
Hold home ACE inhibitor until tolerating diet, resume by discharge

## 2023-04-05 NOTE — Plan of Care (Signed)
  Problem: Metabolic: Goal: Ability to maintain appropriate glucose levels will improve Outcome: Progressing   

## 2023-04-05 NOTE — Assessment & Plan Note (Addendum)
Possibly precipitated by viral gastroenteritis. Anion gap closed x2, CBGs 100-200s.  Continues to have nausea and vomiting nightly though seems to be doing well during the day. -Advance diet as tolerated -Semglee 14u daily, continue SSI with meals -Sch zofran, scopolamine patch -Can redose phenergan prn -Monitor BMP

## 2023-04-05 NOTE — Assessment & Plan Note (Signed)
Cr overall improving in the setting of fluid resuscitation.  -Continue to monitor, anticipate continued improvement with IV fluids and p.o. intake

## 2023-04-05 NOTE — Progress Notes (Addendum)
Daily Progress Note Intern Pager: [phone]  Patient name: Zachary Vazquez Medical record number: [id number] Date of birth: 11/18/1985 Age: 37 y.o. Gender: male  Primary Care Provider: Ivonne Andrew, NP Consultants: None Code Status: Full   Pt Overview and Major Events to Date:  6/6 admitted, started Endo tool 6/7 transitioning off of Endo tool   Assessment and Plan:   Edenilson Hollenkamp is a 37 y.o. male p/w N/V 2/2 DKA. Likely in the setting of gastroenteritis.  Active Hospital Problems   *Diabetic ketoacidosis associated with type 1 diabetes mellitus (HCC)           Possibly precipitated by viral gastroenteritis.           Anion gap closed x2, CBGs 100-200s.  Continues to           have nausea and vomiting nightly though seems to           be doing well during the day.           -Advance diet as tolerated           -Semglee 14u daily, continue SSI with meals           -Sch zofran, scopolamine patch           -Can redose phenergan prn           -Monitor BMP    Nausea and vomiting           Could be due to DKA, though would expect symptoms           to have improved with improved glucose control.           Also considering his hx of intractable nausea           vomiting and previous admissions,           marijuana/tobacco use, and hx of mallory weiss           tears. EKG over admission unchanged from prior.           Hgb stable.  Lipase WNL.           -scheduled zofran, scopolamine patch           -Phenergan, compazine as needed           -could consider addition of TCA for abortive and           prophylactic n/v effects           -protonix bid           -nicotine patch           -Offer capsaicin cream bid           -will consult GI, appreciate recs    Hyperbilirubinemia           Tbili elevated on admission, has since normalized           with IVF    Hypertension associated with diabetes (HCC)           Hold home ACE inhibitor until tolerating  diet,           resume by discharge    DKA (diabetic ketoacidosis) (HCC)    AKI (acute kidney injury) (HCC)           Cr overall improving in the setting of fluid           resuscitation. -Continue to monitor, anticipate  continued improvement with IV fluids and            p.o. intake   Resolved Hospital Problems No resolved problems to display.  FEN/GI: advance diet as tolerated PPx: lovenox Dispo:Home pending clinical improvement . Barriers include ability to take p.o.  Subjective:  Had continuous nausea and vomiting overnight (see my separate note dated 6/7). This AM, he is using ice chips and ginger ale to keep his mouth moist. He feels that he has never been this sick for this long with DKA.  Objective: Temp:  [98.4 F (36.9 C)-99.4 F (37.4 C)] 99.4 F (37.4 C) (06/08 0039) Pulse Rate:  [78-104] 82 (06/08 0039) Resp:  [18] 18 (06/08 0039) BP: (148-166)/(94-108) 148/95 (06/08 0039) SpO2:  [98 %-100 %] 98 % (06/08 0039) Weight:  [76.9 kg] 76.9 kg (06/07 0437) Physical Exam: General: Lying in bed, in NAD Cardiovascular: Mildly tachycardic to 102, no m/r/g Respiratory: CTAB anteriorly, no w/r/r Abdomen: Soft, nondistended, nontender, normoactive bowel sounds Extremities: WWP, moves all grossly equally in bed  Laboratory: Most recent CBC Lab Results  Component Value Date   WBC 17.2 (H) 04/04/2023   HGB 13.1 04/04/2023   HCT 37.8 (L) 04/04/2023   MCV 89.6 04/04/2023   PLT 282 04/04/2023   Most recent BMP    Latest Ref Rng & Units 04/04/2023   12:17 PM  BMP  Glucose 70 - 99 mg/dL 355   BUN 6 - 20 mg/dL 13   Creatinine 7.32 - 1.24 mg/dL 2.02   Sodium 542 - 706 mmol/L 136   Potassium 3.5 - 5.1 mmol/L 3.6   Chloride 98 - 111 mmol/L 101   CO2 22 - 32 mmol/L 27   Calcium 8.9 - 10.3 mg/dL 9.0    Evette Georges, MD 04/05/2023, 4:25 AM PGY-1, [facility] Family [facility] FPTS Intern pager: [phone], text pages welcome Secure chat group [facility] Teaching Service

## 2023-04-05 NOTE — Progress Notes (Signed)
Patient continues to have nausea and vomiting with occasional dry heaves. Notified on call. Compazine ordered and given. Patient given ice chips to help with dry mouth.   Will continue to monitor

## 2023-04-06 DIAGNOSIS — I152 Hypertension secondary to endocrine disorders: Secondary | ICD-10-CM

## 2023-04-06 DIAGNOSIS — E1159 Type 2 diabetes mellitus with other circulatory complications: Secondary | ICD-10-CM

## 2023-04-06 DIAGNOSIS — R112 Nausea with vomiting, unspecified: Secondary | ICD-10-CM

## 2023-04-06 LAB — HEPATIC FUNCTION PANEL
ALT: 14 U/L (ref 0–44)
AST: 14 U/L — ABNORMAL LOW (ref 15–41)
Albumin: 3 g/dL — ABNORMAL LOW (ref 3.5–5.0)
Alkaline Phosphatase: 42 U/L (ref 38–126)
Bilirubin, Direct: 0.2 mg/dL (ref 0.0–0.2)
Indirect Bilirubin: 1.1 mg/dL — ABNORMAL HIGH (ref 0.3–0.9)
Total Bilirubin: 1.3 mg/dL — ABNORMAL HIGH (ref 0.3–1.2)
Total Protein: 5.2 g/dL — ABNORMAL LOW (ref 6.5–8.1)

## 2023-04-06 LAB — BASIC METABOLIC PANEL
Anion gap: 11 (ref 5–15)
BUN: 15 mg/dL (ref 6–20)
CO2: 24 mmol/L (ref 22–32)
Calcium: 8.5 mg/dL — ABNORMAL LOW (ref 8.9–10.3)
Chloride: 100 mmol/L (ref 98–111)
Creatinine, Ser: 1.37 mg/dL — ABNORMAL HIGH (ref 0.61–1.24)
GFR, Estimated: >60 mL/min (ref >60)
Glucose, Bld: 262 mg/dL — ABNORMAL HIGH (ref 70–99)
Potassium: 3.8 mmol/L (ref 3.5–5.1)
Sodium: 135 mmol/L (ref 135–145)

## 2023-04-06 LAB — GLUCOSE, CAPILLARY
Glucose-Capillary: 252 mg/dL — ABNORMAL HIGH (ref 70–99)
Glucose-Capillary: 233 mg/dL — ABNORMAL HIGH (ref 70–99)
Glucose-Capillary: 217 mg/dL — ABNORMAL HIGH (ref 70–99)
Glucose-Capillary: 237 mg/dL — ABNORMAL HIGH (ref 70–99)

## 2023-04-06 LAB — LIPASE, BLOOD: Lipase: 21 U/L (ref 11–51)

## 2023-04-06 MED ORDER — LISINOPRIL 20 MG PO TABS
20.0000 mg | ORAL_TABLET | Freq: Every day | ORAL | Status: DC
  Administered 2023-04-07: 20 mg via ORAL
  Filled 2023-04-06: qty 1

## 2023-04-06 MED ORDER — INSULIN GLARGINE-YFGN 100 UNIT/ML ~~LOC~~ SOLN
18.0000 [IU] | Freq: Every day | SUBCUTANEOUS | Status: DC
  Administered 2023-04-06: 18 [IU] via SUBCUTANEOUS
  Filled 2023-04-06 (×2): qty 0.18

## 2023-04-06 MED ORDER — LISINOPRIL 10 MG PO TABS
10.0000 mg | ORAL_TABLET | Freq: Once | ORAL | Status: AC
  Administered 2023-04-06: 10 mg via ORAL
  Filled 2023-04-06: qty 1

## 2023-04-06 MED ORDER — METOCLOPRAMIDE HCL 5 MG/5ML PO SOLN
5.0000 mg | Freq: Three times a day (TID) | ORAL | Status: DC
  Administered 2023-04-06 – 2023-04-07 (×3): 5 mg via ORAL
  Filled 2023-04-06 (×5): qty 10

## 2023-04-06 NOTE — Assessment & Plan Note (Signed)
Home lisinopril 10 mg daily

## 2023-04-06 NOTE — Assessment & Plan Note (Signed)
Cr overall improving in the setting of fluid resuscitation.  -Continue to monitor, anticipate continued improvement with IV fluids and p.o. intake

## 2023-04-06 NOTE — Progress Notes (Signed)
Daily Progress Note Intern Pager: [phone]  Patient name: Zachary Vazquez Medical record number: [id number] Date of birth: 01/31/1986 Age: 37 y.o. Gender: male  Primary Care Provider: Ivonne Andrew, NP Consultants: None Code Status: Full  Pt Overview and Major Events to Date:  6/6 admitted, started endotool 6/7 transitioned off endotool 6/8 restarted endotool, transitioned off  Assessment and Plan:  Zachary Vazquez is a 37 y.o. male p/w N/V in the setting of DKA  Active Hospital Problems   *Diabetic ketoacidosis associated with type 1 diabetes mellitus (HCC)           Possibly precipitated by viral gastroenteritis.           Anion gap closed, but blood glucose rising into           200s this morning. Also having significant N/V           which is contributing.           -Advance diet as tolerated           -Semglee 18u daily, continue SSI with meals           -Scheduled Phenergan every 6 hours           -Monitor BMP    Nausea and vomiting           Day 5 of vomiting in the setting of DKA, though           unclear cause.  Suspicious for diabetic           gastroparesis.  Possible viral gastroenteritis           versus marijuana/tobacco use.  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            His symptoms have been refractory to Zofran,           Phenergan.  KUB unremarkable 6/8.  Lipase WNL on           6/7. LFTs unremarkable.           -Start reglan 5mg  QID           -Scheduled Phenergan, scopolamine patch, capsaicin           cream, nicotine patch           -Protonix twice daily           -consider GI consult tomorrow if symptoms           refractory to reglan     Hypertension associated with diabetes (HCC)           Home lisinopril 10 mg daily    DKA (diabetic ketoacidosis) (HCC)    AKI (acute kidney injury) (HCC)           Cr overall improving in the setting of fluid           resuscitation. -Continue to monitor,  anticipate           continued improvement with IV fluids and            p.o. intake   Resolved Hospital Problems   Hyperbilirubinemia        Date Resolved: 04/06/2023     FEN/GI: advancing diet as tolerated PPx: lovenox Dispo:Home pending clinical improvement . Barriers include continued management of DKA and N/V.   Subjective:  Overnight, had worsening N/V, no bleeding, no pain. Symptoms  slightly improved this morning and he has been able to keep down juice and water. Willing to try reglan and see how he does today. Denies abd pain  Objective: Temp:  [98.6 F (37 C)-98.8 F (37.1 C)] 98.8 F (37.1 C) (06/09 0528) Pulse Rate:  [74-110] 74 (06/09 1000) Resp:  [17-18] 18 (06/09 0528) BP: (158-176)/(93-103) 172/100 (06/09 0920) SpO2:  [96 %-98 %] 97 % (06/09 1000) Physical Exam: General: NAD Cardiovascular: RRR no murmur Respiratory: CTAB on RA Abdomen: soft NTND Extremities: no edema  Laboratory: Most recent CBC Lab Results  Component Value Date   WBC 11.8 (H) 04/05/2023   HGB 12.8 (L) 04/05/2023   HCT 36.8 (L) 04/05/2023   MCV 92.5 04/05/2023   PLT 213 04/05/2023   Most recent BMP    Latest Ref Rng & Units 04/06/2023    7:19 AM  BMP  Glucose 70 - 99 mg/dL 161   BUN 6 - 20 mg/dL 15   Creatinine 0.96 - 1.24 mg/dL 0.45   Sodium 409 - 811 mmol/L 135   Potassium 3.5 - 5.1 mmol/L 3.8   Chloride 98 - 111 mmol/L 100   CO2 22 - 32 mmol/L 24   Calcium 8.9 - 10.3 mg/dL 8.5     AST 14 ALT 14 Tbili 1.3, indirect bili 1.1  Imaging/Diagnostic Tests:  KUB 6/8 IMPRESSION: No radiographic abnormalities are seen in abdomen.    Vonna Drafts, MD 04/06/2023, 11:15 AM  PGY-1, [facility] Family Medicine FPTS Intern pager: [phone], text pages welcome Secure chat group [facility] Teaching Service

## 2023-04-06 NOTE — Assessment & Plan Note (Addendum)
Day 5 of vomiting in the setting of DKA, though unclear cause.  Suspicious for diabetic gastroparesis.  Possible viral gastroenteritis versus marijuana/tobacco use.  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  His symptoms have been refractory to Zofran, Phenergan.  KUB unremarkable 6/8.  Lipase WNL on 6/7. LFTs unremarkable. -Start reglan 5mg  QID -Scheduled Phenergan, scopolamine patch, capsaicin cream, nicotine patch -Protonix twice daily -consider GI consult tomorrow if symptoms refractory to reglan

## 2023-04-06 NOTE — Inpatient Diabetes Management (Signed)
Inpatient Diabetes Program Recommendations  AACE/ADA: New Consensus Statement on Inpatient Glycemic Control (2015)  Target Ranges:  Prepandial:   less than 140 mg/dL      Peak postprandial:   less than 180 mg/dL (1-2 hours)      Critically ill patients:  140 - 180 mg/dL   Lab Results  Component Value Date   GLUCAP 252 (H) 04/06/2023   HGBA1C 6.4 (A) 12/18/2022    Review of Glycemic Control  Diabetes history: DM1 Outpatient Diabetes medications: Lantus 28 units QHS, Novolog TID SSI Current orders for Inpatient glycemic control: Semglee 18 QHS, Novolog 0-9 units TID with meals  Went back on IV insulin yesterday am for blood sugar of 532. Transitioned off last night and was given Semglee 14 units.  252, 262 this am. Needs increase in basal insulin.  Inpatient Diabetes Program Recommendations:    Increase Semglee to 24 units QHS  Add meal coverage - Novolog 4 units TID with meals if eating > 50%  F/U on Mon, June 10th.  Thank you. Ailene Ards, RD, LDN, CDCES Inpatient Diabetes Coordinator [phone]

## 2023-04-06 NOTE — Progress Notes (Addendum)
FMTS Interim Progress Note  S: Went to evaluate patient at bedside and TTM blood pressure and continuous vomiting.  Patient states he is doing much better than when he first came in but is persistently vomiting despite switching Zofran to Phenergan overnight.  Denies any shortness of breath, headache, changes in vision, chest pain.  Only complaint is nausea and vomiting.  Admits to having high blood pressure in the past under similar circumstances, is on home lisinopril.  O: BP (!) 176/103 (BP Location: Right Arm)   Pulse (!) 110   Temp 98.8 F (37.1 C) (Oral)   Resp 18   Ht 5\' 11"  (1.803 m)   Wt 76.9 kg   SpO2 96%   BMI 23.65 kg/m   General: 37 year old male, sitting up in bed, pleasant speak with, NAD Cardio: Tachycardic, regular rhythm Lungs: CTAB, normal effort Abdomen: Soft, nontender palpation, nondistended, bowel sounds present  A/P: DKA Patient transitioned off of Endo tool yesterday evening, BMP not back this morning but sugars have increased into 200s -F/U AM BMP to ensure patient does not need to go back on Endo tool  Nausea vomiting Now on day 5 of vomiting.  Unsure if DKA caused vomiting or if cause of vomiting caused DKA?  Abdominal exam is reassuring.  Will plan per day team to consult GI for further evaluation. -Continue current antiemetics for now -scheduled Phenergan and Compazine as needed, consider alternatives  HTN Uncontrolled in 170s/90's-low 100's. On home lisinopril. Asymptomatic. Elevated in setting of acute illness.  -Continue home lisinopril -CTM.   Erick Alley, DO 04/06/2023, 6:20 AM PGY-2, [facility] Family Medicine Service pager [phone]

## 2023-04-06 NOTE — Assessment & Plan Note (Addendum)
Possibly precipitated by viral gastroenteritis. Anion gap closed, but blood glucose rising into 200s this morning. Also having significant N/V which is contributing. -Advance diet as tolerated -Semglee 18u daily, continue SSI with meals -Scheduled Phenergan every 6 hours -Monitor BMP

## 2023-04-07 LAB — COMPREHENSIVE METABOLIC PANEL
ALT: 15 U/L (ref 0–44)
AST: 14 U/L — ABNORMAL LOW (ref 15–41)
Albumin: 3.3 g/dL — ABNORMAL LOW (ref 3.5–5.0)
Alkaline Phosphatase: 47 U/L (ref 38–126)
Anion gap: 9 (ref 5–15)
BUN: 9 mg/dL (ref 6–20)
CO2: 30 mmol/L (ref 22–32)
Calcium: 8.7 mg/dL — ABNORMAL LOW (ref 8.9–10.3)
Chloride: 97 mmol/L — ABNORMAL LOW (ref 98–111)
Creatinine, Ser: 1.18 mg/dL (ref 0.61–1.24)
GFR, Estimated: >60 mL/min (ref >60)
Glucose, Bld: 224 mg/dL — ABNORMAL HIGH (ref 70–99)
Potassium: 3.5 mmol/L (ref 3.5–5.1)
Sodium: 136 mmol/L (ref 135–145)
Total Bilirubin: 1.4 mg/dL — ABNORMAL HIGH (ref 0.3–1.2)
Total Protein: 5.4 g/dL — ABNORMAL LOW (ref 6.5–8.1)

## 2023-04-07 LAB — CBC
HCT: 38.1 % — ABNORMAL LOW (ref 39.0–52.0)
Hemoglobin: 13.2 g/dL (ref 13.0–17.0)
MCH: 32 pg (ref 26.0–34.0)
MCHC: 34.6 g/dL (ref 30.0–36.0)
MCV: 92.3 fL (ref 80.0–100.0)
Platelets: 220 10*3/uL (ref 150–400)
RBC: 4.13 MIL/uL — ABNORMAL LOW (ref 4.22–5.81)
RDW: 11.7 % (ref 11.5–15.5)
WBC: 9 10*3/uL (ref 4.0–10.5)
nRBC: 0 % (ref 0.0–0.2)

## 2023-04-07 LAB — GLUCOSE, CAPILLARY
Glucose-Capillary: 200 mg/dL — ABNORMAL HIGH (ref 70–99)
Glucose-Capillary: 256 mg/dL — ABNORMAL HIGH (ref 70–99)
Glucose-Capillary: 235 mg/dL — ABNORMAL HIGH (ref 70–99)
Glucose-Capillary: 204 mg/dL — ABNORMAL HIGH (ref 70–99)
Glucose-Capillary: 280 mg/dL — ABNORMAL HIGH (ref 70–99)

## 2023-04-07 MED ORDER — PROMETHAZINE HCL 12.5 MG PO TABS: 12.5000 mg | ORAL_TABLET | Freq: Four times a day (QID) | ORAL | Status: DC | PRN

## 2023-04-07 MED ORDER — INSULIN GLARGINE-YFGN 100 UNIT/ML ~~LOC~~ SOLN
22.0000 [IU] | Freq: Every day | SUBCUTANEOUS | Status: DC
  Administered 2023-04-07: 22 [IU] via SUBCUTANEOUS
  Filled 2023-04-07 (×2): qty 0.22

## 2023-04-07 MED ORDER — SODIUM CHLORIDE 0.9 % IV SOLN: 12.5000 mg | Freq: Four times a day (QID) | INTRAVENOUS | Status: DC | PRN

## 2023-04-07 MED ORDER — LISINOPRIL 20 MG PO TABS
40.0000 mg | ORAL_TABLET | Freq: Every day | ORAL | Status: DC
  Administered 2023-04-08: 40 mg via ORAL
  Filled 2023-04-07: qty 2

## 2023-04-07 MED ORDER — METOCLOPRAMIDE HCL 5 MG PO TABS
5.0000 mg | ORAL_TABLET | Freq: Three times a day (TID) | ORAL | Status: DC
  Administered 2023-04-07 – 2023-04-08 (×4): 5 mg via ORAL
  Filled 2023-04-07 (×4): qty 1

## 2023-04-07 MED ORDER — LISINOPRIL 20 MG PO TABS
20.0000 mg | ORAL_TABLET | Freq: Once | ORAL | Status: DC
  Filled 2023-04-07: qty 1

## 2023-04-07 NOTE — Progress Notes (Signed)
Patient's BP 180/111, 184/107. Denies any pain, slightly nauseated . On call MD DR. Quillen notified. NO new orders at this time. Plan of care continues.

## 2023-04-07 NOTE — Assessment & Plan Note (Signed)
Lisinopril increased to 40mg  daily due to elevated BP during this admission

## 2023-04-07 NOTE — Inpatient Diabetes Management (Signed)
Inpatient Diabetes Program Recommendations  AACE/ADA: New Consensus Statement on Inpatient Glycemic Control (2015)  Target Ranges:  Prepandial:   less than 140 mg/dL      Peak postprandial:   less than 180 mg/dL (1-2 hours)      Critically ill patients:  140 - 180 mg/dL   Lab Results  Component Value Date   GLUCAP 256 (H) 04/07/2023   HGBA1C 6.4 (A) 12/18/2022    Review of Glycemic Control  Latest Reference Range & Units 04/06/23 16:55 04/06/23 21:07 04/07/23 05:47 04/07/23 11:09  Glucose-Capillary 70 - 99 mg/dL 161 (H) 096 (H) 045 (H) 256 (H)  Diabetes history: DM1 Outpatient Diabetes medications: Lantus 28 units QHS, Novolog TID SSI Current orders for Inpatient glycemic control: Semglee 22 units at bedtime, Novolog 0-9 units TID with meals  Inpatient Diabetes Program Recommendations:    Please consider adding Novolog 3 units tid with meals (hold if patient eats less than 50% or NPO) due to history of Type 1 DM.    Thanks,  Beryl Meager, RN, BC-ADM Inpatient Diabetes Coordinator Pager [phone]  (8a-5p)

## 2023-04-07 NOTE — Plan of Care (Signed)
  Problem: Education: Goal: Knowledge of General Education information will improve Description: Including pain rating scale, medication(s)/side effects and non-pharmacologic comfort measures Outcome: Progressing   Problem: Health Behavior/Discharge Planning: Goal: Ability to manage health-related needs will improve Outcome: Progressing   Problem: Clinical Measurements: Goal: Ability to maintain clinical measurements within normal limits will improve Outcome: Progressing Goal: Diagnostic test results will improve Outcome: Progressing   Problem: Nutrition: Goal: Adequate nutrition will be maintained Outcome: Progressing   Problem: Education: Goal: Ability to describe self-care measures that may prevent or decrease complications (Diabetes Survival Skills Education) will improve Outcome: Progressing Goal: Individualized Educational Video(s) Outcome: Progressing   Problem: Coping: Goal: Ability to adjust to condition or change in health will improve Outcome: Progressing   Problem: Fluid Volume: Goal: Ability to maintain a balanced intake and output will improve Outcome: Progressing   Problem: Health Behavior/Discharge Planning: Goal: Ability to identify and utilize available resources and services will improve Outcome: Progressing Goal: Ability to manage health-related needs will improve Outcome: Progressing   Problem: Metabolic: Goal: Ability to maintain appropriate glucose levels will improve Outcome: Progressing   Problem: Nutritional: Goal: Maintenance of adequate nutrition will improve Outcome: Progressing Goal: Progress toward achieving an optimal weight will improve Outcome: Progressing   Problem: Skin Integrity: Goal: Risk for impaired skin integrity will decrease Outcome: Progressing   Problem: Tissue Perfusion: Goal: Adequacy of tissue perfusion will improve Outcome: Progressing   Problem: Education: Goal: Ability to describe self-care measures that may  prevent or decrease complications (Diabetes Survival Skills Education) will improve Outcome: Progressing Goal: Individualized Educational Video(s) Outcome: Progressing   Problem: Cardiac: Goal: Ability to maintain an adequate cardiac output will improve Outcome: Progressing   Problem: Health Behavior/Discharge Planning: Goal: Ability to identify and utilize available resources and services will improve Outcome: Progressing Goal: Ability to manage health-related needs will improve Outcome: Progressing   Problem: Fluid Volume: Goal: Ability to achieve a balanced intake and output will improve Outcome: Progressing   Problem: Metabolic: Goal: Ability to maintain appropriate glucose levels will improve Outcome: Progressing   Problem: Nutritional: Goal: Maintenance of adequate nutrition will improve Outcome: Progressing Goal: Maintenance of adequate weight for body size and type will improve Outcome: Progressing   Problem: Respiratory: Goal: Will regain and/or maintain adequate ventilation Outcome: Progressing   Problem: Urinary Elimination: Goal: Ability to achieve and maintain adequate renal perfusion and functioning will improve Outcome: Progressing

## 2023-04-07 NOTE — Assessment & Plan Note (Addendum)
Possibly precipitated by viral gastroenteritis. Anion gap closed, blood glucose stable in 200s this morning. N/V improved with addition of Reglan -Advance diet as tolerated -Semglee 22u daily, continue SSI with meals -Scheduled Reglan, scopolamine patch -PRN phenergan -Monitor BMP -1/2 mIVF today, can dc fluids if improved PO intake

## 2023-04-07 NOTE — Assessment & Plan Note (Addendum)
Improving. ?gastroparesis vs gastroenteritis vs MJ/tobacco use. -sch reglan -scopolamine patch -prn phenergan -protonix -advance diet as tolerated

## 2023-04-07 NOTE — Progress Notes (Signed)
Daily Progress Note Intern Pager: [phone]  Patient name: Zachary Vazquez Medical record number: [id number] Date of birth: November 26, 1985 Age: 37 y.o. Gender: male  Primary Care Provider: Ivonne Andrew, NP Consultants: None Code Status: Full   Pt Overview and Major Events to Date:  6/6 admitted, started endotool 6/7 transitioned off endotool 6/8 restarted endotool, transitioned off   Assessment and Plan:   Zachary Vazquez is a 37 y.o. male p/w N/V in the setting of DKA  Active Hospital Problems   *Diabetic ketoacidosis associated with type 1 diabetes mellitus (HCC)           Possibly precipitated by viral gastroenteritis.           Anion gap closed, blood glucose stable in 200s           this morning. N/V improved with addition of Reglan           -Advance diet as tolerated           -Semglee 22u daily, continue SSI with meals           -Scheduled Reglan, scopolamine patch           -PRN phenergan           -Monitor BMP           -1/2 mIVF today, can dc fluids if improved PO           intake    Nausea and vomiting           Improving. ?gastroparesis vs gastroenteritis vs           MJ/tobacco use.           -sch reglan           -scopolamine patch           -prn phenergan           -protonix           -advance diet as tolerated    Hypertension associated with diabetes (HCC)           Lisinopril increased to 40mg  daily due to elevated           BP during this            admission    DKA (diabetic ketoacidosis) (HCC)   Resolved Hospital Problems   Hyperbilirubinemia        Date Resolved: 04/06/2023    AKI (acute kidney injury) [facility])        Date Resolved: 04/07/2023     FEN/GI: advancing diet as tolerated PPx: lovenox Dispo:Home pending clinical improvement . Barriers include continued management of DKA and N/V.   Subjective:  NAEON, no vomiting overnight, feeling much better compared to yesterday. Denies abd pain. States he will try to  increase PO intake today  Objective: Temp:  [98 F (36.7 C)-98.7 F (37.1 C)] 98.2 F (36.8 C) (06/10 1105) Pulse Rate:  [74-100] 100 (06/10 1105) Resp:  [16-17] 16 (06/10 0425) BP: (141-184)/(89-111) 155/89 (06/10 1410) SpO2:  [97 %-99 %] 99 % (06/10 1105) Physical Exam: General: NAD Cardiovascular: RRR Respiratory: CTAB on RA Abdomen: soft NTND Extremities: moves all extremities  Laboratory: Most recent CBC Lab Results  Component Value Date   WBC 9.0 04/07/2023   HGB 13.2 04/07/2023   HCT 38.1 (L) 04/07/2023   MCV 92.3 04/07/2023   PLT 220 04/07/2023   Most recent BMP    Latest Ref Rng &  Units 04/07/2023    1:37 AM  BMP  Glucose 70 - 99 mg/dL 956   BUN 6 - 20 mg/dL 9   Creatinine 2.13 - 0.86 mg/dL 5.78   Sodium 469 - 629 mmol/L 136   Potassium 3.5 - 5.1 mmol/L 3.5   Chloride 98 - 111 mmol/L 97   CO2 22 - 32 mmol/L 30   Calcium 8.9 - 10.3 mg/dL 8.7     Vonna Drafts, MD 04/07/2023, 2:19 PM  PGY-1, [facility] Family Medicine FPTS Intern pager: [phone], text pages welcome Secure chat group [facility] Teaching Service

## 2023-04-08 ENCOUNTER — Other Ambulatory Visit (HOSPITAL_COMMUNITY): Payer: Self-pay

## 2023-04-08 LAB — BASIC METABOLIC PANEL
Anion gap: 12 (ref 5–15)
BUN: 11 mg/dL (ref 6–20)
CO2: 26 mmol/L (ref 22–32)
Calcium: 8.7 mg/dL — ABNORMAL LOW (ref 8.9–10.3)
Chloride: 97 mmol/L — ABNORMAL LOW (ref 98–111)
Creatinine, Ser: 1.24 mg/dL (ref 0.61–1.24)
GFR, Estimated: >60 mL/min (ref >60)
Glucose, Bld: 177 mg/dL — ABNORMAL HIGH (ref 70–99)
Potassium: 3.5 mmol/L (ref 3.5–5.1)
Sodium: 135 mmol/L (ref 135–145)

## 2023-04-08 LAB — GLUCOSE, CAPILLARY: Glucose-Capillary: 128 mg/dL — ABNORMAL HIGH (ref 70–99)

## 2023-04-08 MED ORDER — LISINOPRIL 40 MG PO TABS
40.0000 mg | ORAL_TABLET | Freq: Every day | ORAL | 1 refills | Status: DC
  Filled 2023-04-08: qty 30, 30d supply, fill #0

## 2023-04-08 MED ORDER — NICOTINE 14 MG/24HR TD PT24
14.0000 mg | MEDICATED_PATCH | Freq: Every day | TRANSDERMAL | 0 refills | Status: DC
  Filled 2023-04-08: qty 28, 28d supply, fill #0

## 2023-04-08 MED ORDER — ACETAMINOPHEN 325 MG PO TABS: 650.0000 mg | ORAL_TABLET | Freq: Four times a day (QID) | ORAL | Status: AC | PRN

## 2023-04-08 MED ORDER — INSULIN GLARGINE 100 UNIT/ML ~~LOC~~ SOLN
28.0000 [IU] | Freq: Every day | SUBCUTANEOUS | Status: DC
Start: 2023-04-08 — End: 2023-04-14

## 2023-04-08 MED ORDER — METOCLOPRAMIDE HCL 5 MG PO TABS
5.0000 mg | ORAL_TABLET | Freq: Three times a day (TID) | ORAL | 1 refills | Status: DC | PRN
  Filled 2023-04-08: qty 30, 10d supply, fill #0

## 2023-04-08 NOTE — Assessment & Plan Note (Signed)
Possibly precipitated by viral gastroenteritis.  DKA resolved, anion gap closed, blood glucose stable in 200s this morning. N/V improved with addition of Reglan -Advance diet as tolerated -Semglee 22u daily, continue SSI with meals -Scheduled Reglan, scopolamine patch -PRN phenergan -Monitor BMP -1/2 mIVF today, can dc fluids if improved PO intake

## 2023-04-08 NOTE — Discharge Summary (Signed)
Family Medicine Teaching [facility] Discharge Summary  Patient name: Zachary Vazquez Medical record number: [id number] Date of birth: [birth year] Age: 37 y.o. Gender: male Date of Admission: 04/03/2023  Date of Discharge: 04/08/23 Admitting Physician: Vonna Drafts, MD  Primary Care Provider: Ivonne Andrew, NP Consultants: n/a  Indication for Hospitalization: DKA  Discharge Diagnoses/Problem List:  Principal Problem for Admission: DKA Other Problems addressed during stay:  Principal Problem:   Diabetic ketoacidosis associated with type 1 diabetes mellitus (HCC) Active Problems:   Nausea and vomiting   Hypertension associated with diabetes [facility])    Brief Hospital Course:  Zachary Vazquez is a 37 y.o. male who was admitted to the [facility] Medicine Teaching Service at [facility] for DKA. Hospital course is outlined below by problem.   DKA associated with T1DM Presented on 6/6 with nausea, vomiting, diarrhea with hyperglycemia, elevated ketones, increased anion gap, and pseudohyponatremia corrected to 134.  Likely triggered by viral gastroenteritis.  Reported good compliance with insulin regimen at home.  He was placed on insulin infusion per Endo tool.  He was transitioned off insulin drip on 6/7 however needed to be placed on this again shortly on 6/8 and transitioned off <1 day.   Upon discharge, patient was continued on his home insulin regimen of 28 units Lantus daily plus sliding scale for mealtime.  AKI, hyperbilirubinemia Elevated creatinine on admission to 1.85 and elevated total bilirubin 2.6, both likely secondary to dehydration in setting of DKA.  Both values improved with IV fluids by discharge.  Nausea and vomiting Patient had multiple nights of nausea and vomiting with little relief from medications.  Ultimately was trialed on Zofran 8 mg, Phenergan 25 mg, scopolamine patch, Benadryl, and Reglan.  Phenergan appeared to work best.  Patient had EKG and lipase  performed to rule out cardiac pancreatic sources, both which were negative.  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  Patient's symptoms improved with the use of Reglan.  Upon discharge, patient was sent home with as needed Reglan.  Hypertension Patient's blood pressure consistently elevated throughout admission.  His lisinopril was increased to 40 mg daily and he tolerated well.  He was discharged home with lisinopril 40 mg daily  Issues for follow up: Consider GI outpatient follow up for continued nausea and vomiting Consider initiation of insulin pump outpatient Further adjust hypertensive regimen as necessary-lisinopril increased during this admission  Disposition: home  Discharge Condition: stable, improved   Discharge Exam:  Vitals:   04/08/23 0623 04/08/23 0754  BP: (!) 143/95 (!) 148/96  Pulse: 72 96  Resp: 18 17  Temp: 98.5 F (36.9 C) 98.2 F (36.8 C)  SpO2: 99% 98%   Gen: NAD, resting in bed CV: RRR no murmur Pulm: CTAB normal WOB on RA Abd: Soft, NTND Ext: Moves all extremities, no significant edema  Significant Procedures: n/a  Significant Labs and Imaging:  Recent Labs  Lab 04/07/23 0137  WBC 9.0  HGB 13.2  HCT 38.1*  PLT 220   Recent Labs  Lab 04/07/23 0137  NA 136  K 3.5  CL 97*  CO2 30  GLUCOSE 224*  BUN 9  CREATININE 1.18  CALCIUM 8.7*  ALKPHOS 47  AST 14*  ALT 15  ALBUMIN 3.3*    KUB: IMPRESSION: No radiographic abnormalities are seen in abdomen.    Results/Tests Pending at Time of Discharge: n/a  Discharge Medications:  Allergies as of 04/08/2023       Reactions  Clindamycin/lincomycin Anaphylaxis   Bactrim [sulfamethoxazole-trimethoprim] Swelling, Other (See Comments)   Eyes swell and bumps appear on/near the eyes         Medication List     TAKE these medications    acetaminophen 325 MG tablet Commonly known as: TYLENOL Take 2 tablets (650 mg total) by mouth every 6  (six) hours as needed for mild pain (or Fever >/= 101).   FreeStyle [place] 2 Reader [place] 1 application. by Does not apply route every 14 (fourteen) days.   FreeStyle Libre 2 Sensor Misc Use to check glucose   insulin glargine 100 UNIT/ML injection Commonly known as: LANTUS Inject 0.28 mLs (28 Units total) into the skin at bedtime.   lisinopril 40 MG tablet Commonly known as: ZESTRIL Take 1 tablet (40 mg total) by mouth daily. What changed:  medication strength how much to take   metoCLOPramide 5 MG tablet Commonly known as: REGLAN Take 1 tablet (5 mg total) by mouth every 8 (eight) hours as needed for nausea or vomiting.   nicotine 14 mg/24hr patch Commonly known as: NICODERM CQ - dosed in mg/24 hours Place 1 patch (14 mg total) onto the skin daily.   NovoLOG 100 UNIT/ML injection Generic drug: insulin aspart Use as directed per sliding scale What changed:  how to take this when to take this additional instructions        Discharge Instructions: Please refer to Patient Instructions section of EMR for full details.  Patient was counseled important signs and symptoms that should prompt return to medical care, changes in medications, dietary instructions, activity restrictions, and follow up appointments.   Follow-Up Appointments:  Follow-up Information     Ivonne Andrew, NP. Go on 04/14/2023.   Specialties: Pulmonary Disease, Endocrinology Why: 8:40 AM Contact information: [address] [place] [address] [phone]                 Vonna Drafts, MD 04/08/2023, 11:13 AM PGY-1, [facility]

## 2023-04-08 NOTE — Assessment & Plan Note (Signed)
Improving. ?gastroparesis vs gastroenteritis vs MJ/tobacco use. -sch reglan -scopolamine patch -prn phenergan -protonix -advance diet as tolerated

## 2023-04-08 NOTE — Discharge Instructions (Addendum)
Dear Zachary Vazquez,  Thank you for letting us participate in your care. You were hospitalized for Diabetic ketoacidosis associated with type 1 diabetes mellitus (HCC). You were treated with insulin. You also received medications to help with your nausea/vomiting, and for your blood pressure.   POST-HOSPITAL & CARE INSTRUCTIONS Please review your updated medication list for any changes to your regimen Go to your follow up appointments (listed below)   DOCTOR'S APPOINTMENT   Future Appointments  Date Time Provider Department Center  04/14/2023  8:40 AM Ivonne Andrew, NP [facility] None    Follow-up Information     Ivonne Andrew, NP. Go on 04/14/2023.   Specialties: Pulmonary Disease, Endocrinology Why: 8:40 AM Contact information: [address] [place] [address] [phone]                 Take care and be well!  Family Medicine Teaching Service Inpatient Team [place]  [facility]  [address] [place], [place] [address] [phone]

## 2023-04-08 NOTE — Assessment & Plan Note (Signed)
Lisinopril increased to 40mg  daily due to elevated BP during this admission

## 2023-04-09 ENCOUNTER — Telehealth: Payer: Self-pay

## 2023-04-09 NOTE — Transitions of Care (Post Inpatient/ED Visit) (Signed)
   04/09/2023  Name: Zachary Vazquez MRN: [id number] DOB: June 11, 1986  Today's TOC FU Call Status: Today's TOC FU Call Status:: Successful TOC FU Call Competed TOC FU Call Complete Date: 04/09/23  Transition Care Management Follow-up Telephone Call Date of Discharge: 04/08/23 Discharge Facility: [facility]) Type of Discharge: Inpatient Admission Primary Inpatient Discharge Diagnosis:: diabetes Reason for ED Visit: Other:, Endocrine How have you been since you were released from the hospital?: Better Any questions or concerns?: No  Items Reviewed: Did you receive and understand the discharge instructions provided?: Yes Medications obtained,verified, and reconciled?: Yes (Medications Reviewed) Any new allergies since your discharge?: No Dietary orders reviewed?: NA Do you have support at home?: Yes People in Home: parent(s)  Medications Reviewed Today: Medications Reviewed Today     Reviewed by Annabell Sabal, CMA (Certified Medical Assistant) on 04/09/23 at 1149  Med List Status: <None>   Medication Order Taking? Sig Documenting Provider Last Dose Status Informant  acetaminophen (TYLENOL) 325 MG tablet [id number] Yes Take 2 tablets (650 mg total) by mouth every 6 (six) hours as needed for mild pain (or Fever >/= 101). Vonna Drafts, MD Taking Active   Continuous Blood Gluc Receiver (FREESTYLE LIBRE 2 READER) DEVI [id number] Yes 1 application. by Does not apply route every 14 (fourteen) days. Barbette Merino, NP Taking Active Self  Continuous Blood Gluc Sensor (FREESTYLE LIBRE 2 SENSOR) [place] [id number] Yes Use to check glucose Ivonne Andrew, NP Taking Active Self  insulin aspart (NOVOLOG) 100 UNIT/ML injection [id number] Yes Use as directed per sliding scale  Patient taking differently: Inject into the skin 3 (three) times daily with meals. Sliding scale for units   Ivonne Andrew, NP Taking Active Self, Pharmacy Records           Med Note (CRUTHIS, Marcy Siren   Thu Apr 03, 2023 10:51 AM) Pt was unable to provide sliding scale range.   insulin glargine (LANTUS) 100 UNIT/ML injection [id number] Yes Inject 0.28 mLs (28 Units total) into the skin at bedtime. Vonna Drafts, MD Taking Active   lisinopril (ZESTRIL) 40 MG tablet [id number] Yes Take 1 tablet (40 mg total) by mouth daily. Vonna Drafts, MD Taking Active   metoCLOPramide (REGLAN) 5 MG tablet [id number] Yes Take 1 tablet (5 mg total) by mouth every 8 (eight) hours as needed for nausea or vomiting. Vonna Drafts, MD Taking Active   nicotine (NICODERM CQ - DOSED IN MG/24 HOURS) 14 mg/24hr patch [id number] Yes Place 1 patch (14 mg total) onto the skin daily. Vonna Drafts, MD Taking Active             Home Care and Equipment/Supplies: Were Home Health Services Ordered?: No Any new equipment or medical supplies ordered?: No  Functional Questionnaire: Do you need assistance with bathing/showering or dressing?: No Do you need assistance with meal preparation?: No Do you need assistance with eating?: No Do you have difficulty maintaining continence: No Do you need assistance with getting out of bed/getting out of a chair/moving?: No Do you have difficulty managing or taking your medications?: No  Follow up appointments reviewed: PCP Follow-up appointment confirmed?: Yes Date of PCP follow-up appointment?: 04/14/23 Follow-up Provider: [facility] Follow-up appointment confirmed?: NA Do you need transportation to your follow-up appointment?: No Do you understand care options if your condition(s) worsen?: Yes-patient verbalized understanding    SIGNATURE Fredirick Maudlin

## 2023-04-11 ENCOUNTER — Other Ambulatory Visit: Payer: Self-pay

## 2023-04-14 ENCOUNTER — Encounter: Payer: Self-pay | Admitting: Nurse Practitioner

## 2023-04-14 ENCOUNTER — Other Ambulatory Visit: Payer: Self-pay

## 2023-04-14 ENCOUNTER — Telehealth: Payer: Self-pay | Admitting: Nurse Practitioner

## 2023-04-14 ENCOUNTER — Ambulatory Visit (INDEPENDENT_AMBULATORY_CARE_PROVIDER_SITE_OTHER): Payer: Self-pay | Admitting: Nurse Practitioner

## 2023-04-14 ENCOUNTER — Other Ambulatory Visit: Payer: Self-pay | Admitting: Nurse Practitioner

## 2023-04-14 VITALS — BP 124/84 | HR 96 | Temp 97.2°F | Ht 71.0 in | Wt 178.4 lb

## 2023-04-14 DIAGNOSIS — E109 Type 1 diabetes mellitus without complications: Secondary | ICD-10-CM

## 2023-04-14 DIAGNOSIS — K5900 Constipation, unspecified: Secondary | ICD-10-CM

## 2023-04-14 LAB — POCT GLYCOSYLATED HEMOGLOBIN (HGB A1C): Hemoglobin A1C: 6.4 % — AB (ref 4.0–5.6)

## 2023-04-14 MED ORDER — INSULIN GLARGINE 100 UNIT/ML ~~LOC~~ SOLN
28.0000 [IU] | Freq: Every day | SUBCUTANEOUS | 2 refills | Status: DC
Start: 2023-04-14 — End: 2023-08-04
  Filled 2023-04-14: qty 10, 28d supply, fill #0
  Filled 2023-06-05: qty 10, 28d supply, fill #1
  Filled 2023-07-01: qty 10, 28d supply, fill #2

## 2023-04-14 MED ORDER — INSULIN GLARGINE 100 UNIT/ML ~~LOC~~ SOLN
35.0000 [IU] | Freq: Every day | SUBCUTANEOUS | 12 refills | Status: DC
Start: 2023-04-14 — End: 2023-04-14
  Filled 2023-04-14: qty 10, 28d supply, fill #0

## 2023-04-14 MED ORDER — POLYETHYLENE GLYCOL 3350 17 GM/SCOOP PO POWD
17.0000 g | Freq: Two times a day (BID) | ORAL | 1 refills | Status: AC | PRN
Start: 2023-04-14 — End: ?
  Filled 2023-04-14: qty 510, 15d supply, fill #0

## 2023-04-14 MED ORDER — INSULIN GLARGINE 100 UNIT/ML ~~LOC~~ SOLN
35.0000 [IU] | Freq: Every day | SUBCUTANEOUS | 12 refills | Status: DC
Start: 2023-04-14 — End: 2023-04-25

## 2023-04-14 MED ORDER — DOCUSATE SODIUM 100 MG PO CAPS
100.0000 mg | ORAL_CAPSULE | Freq: Two times a day (BID) | ORAL | 0 refills | Status: DC
Start: 2023-04-14 — End: 2023-06-13
  Filled 2023-04-14: qty 10, 5d supply, fill #0

## 2023-04-14 MED ORDER — RESTORA PO CAPS
1.0000 | ORAL_CAPSULE | Freq: Every day | ORAL | 3 refills | Status: DC
Start: 2023-04-14 — End: 2023-06-13
  Filled 2023-04-14: qty 30, 30d supply, fill #0

## 2023-04-14 NOTE — Patient Instructions (Addendum)
1. Type 1 diabetes mellitus without complication (HCC)  - POCT glycosylated hemoglobin (Hb A1C) - CBC - Comprehensive metabolic panel  - needs endocrinology referral    2. Constipation, unspecified constipation type  - docusate sodium (COLACE) 100 MG capsule; Take 1 capsule (100 mg total) by mouth 2 (two) times daily.  Dispense: 10 capsule; Refill: 0 - polyethylene glycol powder (GLYCOLAX/MIRALAX) 17 GM/SCOOP powder; Take 17 g by mouth 2 (two) times daily as needed.  Dispense: 510 g; Refill: 1  High fiber diet   Follow up:  Follow up in 3 months  Constipation, Adult Constipation is when a person has trouble pooping (having a bowel movement). When you have this condition, you may poop fewer than 3 times a week. Your poop (stool) may also be dry, hard, or bigger than normal. Follow these instructions at home: Eating and drinking  Eat foods that have a lot of fiber, such as: Fresh fruits and vegetables. Whole grains. Beans. Eat less of foods that are low in fiber and high in fat and sugar, such as: [place] fries. Hamburgers. Cookies. Candy. Soda. Drink enough fluid to keep your pee (urine) pale yellow. General instructions Exercise regularly or as told by your doctor. Try to do 150 minutes of exercise each week. Go to the restroom when you feel like you need to poop. Do not hold it in. Take over-the-counter and prescription medicines only as told by your doctor. These include any fiber supplements. When you poop: Do deep breathing while relaxing your lower belly (abdomen). Relax your pelvic floor. The pelvic floor is a group of muscles that support the rectum, bladder, and intestines (as well as the uterus in women). Watch your condition for any changes. Tell your doctor if you notice any. Keep all follow-up visits as told by your doctor. This is important. Contact a doctor if: You have pain that gets worse. You have a fever. You have not pooped for 4 days. You  vomit. You are not hungry. You lose weight. You are bleeding from the opening of the butt (anus). You have thin, pencil-like poop. Get help right away if: You have a fever, and your symptoms suddenly get worse. You leak poop or have blood in your poop. Your belly feels hard or bigger than normal (bloated). You have very bad belly pain. You feel dizzy or you faint. Summary Constipation is when a person poops fewer than 3 times a week, has trouble pooping, or has poop that is dry, hard, or bigger than normal. Eat foods that have a lot of fiber. Drink enough fluid to keep your pee (urine) pale yellow. Take over-the-counter and prescription medicines only as told by your doctor. These include any fiber supplements. This information is not intended to replace advice given to you by your health care provider. Make sure you discuss any questions you have with your health care provider. Document Revised: 08/28/2022 Document Reviewed: 08/28/2022 Elsevier Patient Education  2024 [employer] Inc[employer]      Constipation, Adult Constipation is when a person has fewer than three bowel movements in a week, has difficulty having a bowel movement, or has stools (feces) that are dry, hard, or larger than normal. Constipation may be caused by an underlying condition. It may become worse with age if a person takes certain medicines and does not take in enough fluids. Follow these instructions at home: Eating and drinking  Eat foods that have a lot of fiber, such as beans, whole grains, and fresh fruits and  vegetables. Limit foods that are low in fiber and high in fat and processed sugars, such as fried or sweet foods. These include french fries, hamburgers, cookies, candies, and soda. Drink enough fluid to keep your urine pale yellow. General instructions Exercise regularly or as told by your health care provider. Try to do 150 minutes of moderate exercise each week. Use the bathroom when you have the urge to  go. Do not hold it in. Take over-the-counter and prescription medicines only as told by your health care provider. This includes any fiber supplements. During bowel movements: Practice deep breathing while relaxing the lower abdomen. Practice pelvic floor relaxation. Watch your condition for any changes. Let your health care provider know about them. Keep all follow-up visits as told by your health care provider. This is important. Contact a health care provider if: You have pain that gets worse. You have a fever. You do not have a bowel movement after 4 days. You vomit. You are not hungry or you lose weight. You are bleeding from the opening between the buttocks (anus). You have thin, pencil-like stools. Get help right away if: You have a fever and your symptoms suddenly get worse. You leak stool or have blood in your stool. Your abdomen is bloated. You have severe pain in your abdomen. You feel dizzy or you faint. Summary Constipation is when a person has fewer than three bowel movements in a week, has difficulty having a bowel movement, or has stools (feces) that are dry, hard, or larger than normal. Eat foods that have a lot of fiber, such as beans, whole grains, and fresh fruits and vegetables. Drink enough fluid to keep your urine pale yellow. Take over-the-counter and prescription medicines only as told by your health care provider. This includes any fiber supplements. This information is not intended to replace advice given to you by your health care provider. Make sure you discuss any questions you have with your health care provider. Document Revised: 08/28/2022 Document Reviewed: 08/28/2022 Elsevier Patient Education  2024 [employer].

## 2023-04-14 NOTE — Progress Notes (Signed)
@Patient  ID: Zachary Vazquez, male    DOB: 1986-03-02, 37 y.o.   MRN: [id number]  Chief Complaint  Patient presents with   Diabetes    Follow up     Referring provider: Ivonne Andrew, NP  Recent significant events:  Admission: 04/03/23  Brief Hospital Course:  Zachary Vazquez is a 37 y.o. male who was admitted to the [facility] Teaching Service at [facility] for DKA. Hospital course is outlined below by problem.    DKA associated with T1DM Presented on 6/6 with nausea, vomiting, diarrhea with hyperglycemia, elevated ketones, increased anion gap, and pseudohyponatremia corrected to 134.  Likely triggered by viral gastroenteritis.  Reported good compliance with insulin regimen at home.  He was placed on insulin infusion per Endo tool.  He was transitioned off insulin drip on 6/7 however needed to be placed on this again shortly on 6/8 and transitioned off <1 day.    Upon discharge, patient was continued on his home insulin regimen of 28 units Lantus daily plus sliding scale for mealtime.   AKI, hyperbilirubinemia Elevated creatinine on admission to 1.85 and elevated total bilirubin 2.6, both likely secondary to dehydration in setting of DKA.  Both values improved with IV fluids by discharge.   Nausea and vomiting Patient had multiple nights of nausea and vomiting with little relief from medications.  Ultimately was trialed on Zofran 8 mg, Phenergan 25 mg, scopolamine patch, Benadryl, and Reglan.  Phenergan appeared to work best.  Patient had EKG and lipase performed to rule out cardiac pancreatic sources, both which were negative.  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  Patient's symptoms improved with the use of Reglan.  Upon discharge, patient was sent home with as needed Reglan.   Hypertension Patient's blood pressure consistently elevated throughout admission.  His lisinopril was increased to 40 mg daily and he tolerated well.   He was discharged home with lisinopril 40 mg daily   Issues for follow up: Consider GI outpatient follow up for continued nausea and vomiting Consider initiation of insulin pump outpatient Further adjust hypertensive regimen as necessary-lisinopril increased during this admission   HPI  Patient presents today for a follow-up visit.  He was recently admitted to the hospital please see hospital notes above.  Patient is doing much better today.  A1c today was 6.4.  Patient is followed by pharmacy for diabetic medication management.  We discussed that patient does need a referral to endocrinology for type 1 diabetes but patient refuses.  He states that he cannot afford the bill.  Patient also needs a referral to nephrology but refuses this referral patient also needs a referral for diabetic eye exam but refuses.  He states that he has no insurance and cannot afford those bills.  Our social worker Darl Pikes will meet with him today to discuss resources.  Patient does have issues with constipation.  He refuses referral to GI.  We will prescribe MiraLAX and Colace but patient states that he will not take the medicine.  We discussed that he can try high-fiber diet with Metamucil.  He states that he will try this. Denies f/c/s, n/v/d, hemoptysis, PND, leg swelling Denies chest pain or edema      Allergies  Allergen Reactions   Clindamycin/Lincomycin Anaphylaxis   Bactrim [Sulfamethoxazole-Trimethoprim] Swelling and Other (See Comments)    Eyes swell and bumps appear on/near the eyes     Immunization History  Administered Date(s) Administered   Tdap 12/15/2019  Past Medical History:  Diagnosis Date   History of delayed wound healing 10/2019   Rash and nonspecific skin eruption 10/2019   Type 1 diabetes mellitus (HCC)    Vitamin D deficiency 09/2020   Wound of lower extremity, left, sequela 10/2019    Tobacco History: Social History   Tobacco Use  Smoking Status Every Day   Packs/day:  1.00   Years: 15.00   Additional pack years: 0.00   Total pack years: 15.00   Types: Cigarettes  Smokeless Tobacco Never   Ready to quit: Not Answered Counseling given: Not Answered   Outpatient Encounter Medications as of 04/14/2023  Medication Sig   acetaminophen (TYLENOL) 325 MG tablet Take 2 tablets (650 mg total) by mouth every 6 (six) hours as needed for mild pain (or Fever >/= 101).   docusate sodium (COLACE) 100 MG capsule Take 1 capsule (100 mg total) by mouth 2 (two) times daily.   insulin aspart (NOVOLOG) 100 UNIT/ML injection Use as directed per sliding scale (Patient taking differently: Inject into the skin 3 (three) times daily with meals. Sliding scale for units)   insulin glargine (LANTUS) 100 UNIT/ML injection Inject 0.35 mLs (35 Units total) into the skin once nightly at bedtime. (Patient taking differently: Inject 28 Units into the skin at bedtime.)   insulin glargine (LANTUS) 100 UNIT/ML injection Inject 0.28 mLs (28 Units total) into the skin at bedtime.   lisinopril (ZESTRIL) 40 MG tablet Take 1 tablet (40 mg total) by mouth daily.   polyethylene glycol powder (GLYCOLAX/MIRALAX) 17 GM/SCOOP powder Take 17 g by mouth 2 (two) times daily as needed.   Probiotic Product (RESTORA) CAPS Take 1 capsule by mouth daily.   Continuous Blood Gluc Receiver (FREESTYLE LIBRE 2 READER) DEVI 1 application. by Does not apply route every 14 (fourteen) days. (Patient not taking: Reported on 04/14/2023)   Continuous Blood Gluc Sensor (FREESTYLE LIBRE 2 SENSOR) MISC Use to check glucose (Patient not taking: Reported on 04/14/2023)   metoCLOPramide (REGLAN) 5 MG tablet Take 1 tablet (5 mg total) by mouth every 8 (eight) hours as needed for nausea or vomiting. (Patient not taking: Reported on 04/14/2023)   nicotine (NICODERM CQ - DOSED IN MG/24 HOURS) 14 mg/24hr patch Place 1 patch (14 mg total) onto the skin daily. (Patient not taking: Reported on 04/14/2023)   No facility-administered encounter  medications on file as of 04/14/2023.     Review of Systems  Review of Systems  Constitutional: Negative.   HENT: Negative.    Cardiovascular: Negative.   Gastrointestinal: Negative.   Allergic/Immunologic: Negative.   Neurological: Negative.   Psychiatric/Behavioral: Negative.         Physical Exam  BP 124/84   Pulse 96   Temp (!) 97.2 F (36.2 C)   Ht 5\' 11"  (1.803 m)   Wt 178 lb 6.4 oz (80.9 kg)   SpO2 99%   BMI 24.88 kg/m   Wt Readings from Last 5 Encounters:  04/14/23 178 lb 6.4 oz (80.9 kg)  04/04/23 169 lb 8.5 oz (76.9 kg)  12/18/22 173 lb 6.4 oz (78.7 kg)  07/15/22 185 lb 8 oz (84.1 kg)  04/12/22 181 lb 9.6 oz (82.4 kg)     Physical Exam Vitals and nursing note reviewed.  Constitutional:      General: He is not in acute distress.    Appearance: He is well-developed.  Cardiovascular:     Rate and Rhythm: Normal rate and regular rhythm.  Pulmonary:  Effort: Pulmonary effort is normal.     Breath sounds: Normal breath sounds.  Skin:    General: Skin is warm and dry.  Neurological:     Mental Status: He is alert and oriented to person, place, and time.      Lab Results:  CBC    Component Value Date/Time   WBC 9.0 04/07/2023 0137   RBC 4.13 (L) 04/07/2023 0137   HGB 13.2 04/07/2023 0137   HGB 14.5 07/15/2022 1109   HCT 38.1 (L) 04/07/2023 0137   HCT 43.0 07/15/2022 1109   PLT 220 04/07/2023 0137   PLT 269 07/15/2022 1109   MCV 92.3 04/07/2023 0137   MCV 95 07/15/2022 1109   MCH 32.0 04/07/2023 0137   MCHC 34.6 04/07/2023 0137   RDW 11.7 04/07/2023 0137   RDW 12.0 07/15/2022 1109   LYMPHSABS 0.8 04/05/2023 0537   LYMPHSABS 2.0 07/13/2021 1158   MONOABS 0.5 04/05/2023 0537   EOSABS 0.0 04/05/2023 0537   EOSABS 0.5 (H) 07/13/2021 1158   BASOSABS 0.0 04/05/2023 0537   BASOSABS 0.1 07/13/2021 1158    BMET    Component Value Date/Time   NA 135 04/08/2023 1037   NA 139 07/15/2022 1109   K 3.5 04/08/2023 1037   CL 97 (L)  04/08/2023 1037   CO2 26 04/08/2023 1037   GLUCOSE 177 (H) 04/08/2023 1037   BUN 11 04/08/2023 1037   BUN 23 (H) 07/15/2022 1109   CREATININE 1.24 04/08/2023 1037   CALCIUM 8.7 (L) 04/08/2023 1037   GFRNONAA >60 04/08/2023 1037   GFRAA 82 10/11/2020 1444    BNP    Component Value Date/Time   BNP 41.9 11/17/2021 0053    ProBNP No results found for: "PROBNP"  Imaging: DG Abd 1 View  Result Date: 04/05/2023 CLINICAL DATA:  Vomiting EXAM: ABDOMEN - 1 VIEW COMPARISON:  Abdomen radiograph done on 04/29/2020, CT done on 11/15/2021 FINDINGS: Bowel gas pattern is nonspecific. No abnormal masses or calcifications are seen. Visualized lower lung fields are clear. IMPRESSION: No radiographic abnormalities are seen in abdomen. Electronically Signed   By: Ernie Avena M.D.   On: 04/05/2023 11:34     Assessment & Plan:   Type 1 diabetes mellitus (HCC) - POCT glycosylated hemoglobin (Hb A1C) - CBC - Comprehensive metabolic panel  - needs endocrinology referral    2. Constipation, unspecified constipation type  - docusate sodium (COLACE) 100 MG capsule; Take 1 capsule (100 mg total) by mouth 2 (two) times daily.  Dispense: 10 capsule; Refill: 0 - polyethylene glycol powder (GLYCOLAX/MIRALAX) 17 GM/SCOOP powder; Take 17 g by mouth 2 (two) times daily as needed.  Dispense: 510 g; Refill: 1  High fiber diet   Follow up:  Follow up in 3 months     Ivonne Andrew, NP 04/14/2023

## 2023-04-14 NOTE — Telephone Encounter (Signed)
Done KH 

## 2023-04-14 NOTE — Telephone Encounter (Signed)
Pt called and stated need a new proscription for [employer]  its expired and needs it today because of job

## 2023-04-14 NOTE — Assessment & Plan Note (Signed)
-   POCT glycosylated hemoglobin (Hb A1C) - CBC - Comprehensive metabolic panel  - needs endocrinology referral    2. Constipation, unspecified constipation type  - docusate sodium (COLACE) 100 MG capsule; Take 1 capsule (100 mg total) by mouth 2 (two) times daily.  Dispense: 10 capsule; Refill: 0 - polyethylene glycol powder (GLYCOLAX/MIRALAX) 17 GM/SCOOP powder; Take 17 g by mouth 2 (two) times daily as needed.  Dispense: 510 g; Refill: 1  High fiber diet   Follow up:  Follow up in 3 months

## 2023-04-14 NOTE — Progress Notes (Signed)
Integrated Behavioral [facility] Progress Note  04/14/2023 Name: Odie Neher MRN: [id number] DOB: 23-Feb-1986 Jaydrien Tripplett is a 37 y.o. year old male who sees Ivonne Andrew, NP for primary care. LCSW was initially consulted to assist with community resources.  Interpreter: No.   Interpreter Name & Language: none  Assessment: Patient experiencing financial difficulty due to lack of insurance and   Ongoing Intervention: Today CSW met with patient during PCP visit. Patient does not have insurance; he has previously been denied for Medicaid due to not meeting eligibility criteria. However, this was before Medicaid expansion in December. Advised patient to go to [employer] [facility] center today and meet with the Medicaid workers there.  Patient starts new job tomorrow. He is agreeable to go to start application for Medicaid today at [employer]. Patient reports he has been able to manage his diabetes fairly well, though does end up in the hospital every so often. He is able to get his medications through the [employer].  CSW to follow and will plan to check in with patient via phone.  SDOH (Social Determinants of Health) assessments performed:   Abigail Butts, LCSW [facility] [phone]

## 2023-04-15 LAB — CBC
Hematocrit: 43.3 % (ref 37.5–51.0)
Hemoglobin: 14.7 g/dL (ref 13.0–17.7)
MCH: 31.7 pg (ref 26.6–33.0)
MCHC: 33.9 g/dL (ref 31.5–35.7)
MCV: 94 fL (ref 79–97)
Platelets: 390 10*3/uL (ref 150–450)
RBC: 4.63 x10E6/uL (ref 4.14–5.80)
RDW: 12.4 % (ref 11.6–15.4)
WBC: 11.2 10*3/uL — ABNORMAL HIGH (ref 3.4–10.8)

## 2023-04-15 LAB — COMPREHENSIVE METABOLIC PANEL
ALT: 74 IU/L — ABNORMAL HIGH (ref 0–44)
AST: 50 IU/L — ABNORMAL HIGH (ref 0–40)
Albumin: 4.4 g/dL (ref 4.1–5.1)
Alkaline Phosphatase: 64 IU/L (ref 44–121)
BUN/Creatinine Ratio: 17 (ref 9–20)
BUN: 21 mg/dL — ABNORMAL HIGH (ref 6–20)
Bilirubin Total: <0.2 mg/dL (ref 0.0–1.2)
CO2: 22 mmol/L (ref 20–29)
Calcium: 10.1 mg/dL (ref 8.7–10.2)
Chloride: 104 mmol/L (ref 96–106)
Creatinine, Ser: 1.24 mg/dL (ref 0.76–1.27)
Globulin, Total: 2.5 g/dL (ref 1.5–4.5)
Glucose: 54 mg/dL — ABNORMAL LOW (ref 70–99)
Potassium: 4.5 mmol/L (ref 3.5–5.2)
Sodium: 143 mmol/L (ref 134–144)
Total Protein: 6.9 g/dL (ref 6.0–8.5)
eGFR: 77 mL/min/{1.73_m2} (ref >59)

## 2023-04-17 ENCOUNTER — Ambulatory Visit (INDEPENDENT_AMBULATORY_CARE_PROVIDER_SITE_OTHER): Payer: Self-pay | Admitting: Nurse Practitioner

## 2023-04-17 ENCOUNTER — Other Ambulatory Visit: Payer: Self-pay

## 2023-04-17 ENCOUNTER — Encounter: Payer: Self-pay | Admitting: Nurse Practitioner

## 2023-04-17 ENCOUNTER — Other Ambulatory Visit: Payer: Self-pay | Admitting: Nurse Practitioner

## 2023-04-17 VITALS — BP 145/94 | HR 112 | Temp 97.4°F | Wt 176.0 lb

## 2023-04-17 DIAGNOSIS — E109 Type 1 diabetes mellitus without complications: Secondary | ICD-10-CM

## 2023-04-17 DIAGNOSIS — R112 Nausea with vomiting, unspecified: Secondary | ICD-10-CM

## 2023-04-17 DIAGNOSIS — R110 Nausea: Secondary | ICD-10-CM

## 2023-04-17 LAB — POCT URINALYSIS DIP (PROADVANTAGE DEVICE)
Bilirubin, UA: NEGATIVE
Glucose, UA: NEGATIVE mg/dL
Leukocytes, UA: NEGATIVE
Nitrite, UA: NEGATIVE
Protein Ur, POC: 100 mg/dL — AB
Specific Gravity, Urine: 1.015
Urobilinogen, Ur: 0.2
pH, UA: 6 (ref 5.0–8.0)

## 2023-04-17 LAB — GLUCOSE, POCT (MANUAL RESULT ENTRY): POC Glucose: 175 mg/dl — AB (ref 70–99)

## 2023-04-17 MED ORDER — ONDANSETRON 4 MG PO TBDP
4.0000 mg | ORAL_TABLET | Freq: Three times a day (TID) | ORAL | 0 refills | Status: DC | PRN
Start: 2023-04-17 — End: 2023-05-20
  Filled 2023-04-17: qty 20, 7d supply, fill #0

## 2023-04-17 MED ORDER — METOCLOPRAMIDE HCL 5 MG PO TABS
5.0000 mg | ORAL_TABLET | Freq: Three times a day (TID) | ORAL | 2 refills | Status: DC
Start: 2023-04-17 — End: 2023-04-28
  Filled 2023-04-17: qty 90, 23d supply, fill #0

## 2023-04-17 NOTE — Assessment & Plan Note (Signed)
-   CBC - Comprehensive metabolic panel - metoCLOPramide (REGLAN) 5 MG tablet; Take 1 tablet (5 mg total) by mouth 4 (four) times daily -  before meals and at bedtime.  Dispense: 90 tablet; Refill: 2 - ondansetron (ZOFRAN-ODT) 4 MG disintegrating tablet; Take 1 tablet (4 mg total) by mouth every 8 (eight) hours as needed for nausea or vomiting.  Dispense: 20 tablet; Refill: 0  2. Type 1 diabetes mellitus without complication (HCC)  - Ambulatory referral to Endocrinology   Follow up:  Follow up as scheduled - go to the ED if symptoms worsen

## 2023-04-17 NOTE — Progress Notes (Signed)
@Patient  ID: Zachary Vazquez, male    DOB: 03/08/1986, 37 y.o.   MRN: [id number]  Chief Complaint  Patient presents with   Emesis    All night     Referring provider: Ivonne Andrew, NP   HPI  37 year old male with history of type 1 diabetes  Patient presents today for a sick visit.  He states that last night he was vomiting.  He was seen in the ED recently for this with DKA.  He was prescribed Reglan so he did take that this morning and states that he has not vomited any this afternoon.  He was able to drink water and keep it down this afternoon.  We discussed that he can take Reglan before meals and at bedtime over the next few days.  He does need to keep a close watch on blood sugars.  He does need to stay well-hydrated.  We will check labs today.  We will order Zofran ODT.  We will place urgent referral to endocrinology.  Patient has refused to go in the past but states that he will be willing to go at this point.  He is concerned about cost. Denies f/c/s, n/v/d, hemoptysis, PND, leg swelling Denies chest pain or edema        Allergies  Allergen Reactions   Clindamycin/Lincomycin Anaphylaxis   Bactrim [Sulfamethoxazole-Trimethoprim] Swelling and Other (See Comments)    Eyes swell and bumps appear on/near the eyes     Immunization History  Administered Date(s) Administered   Tdap 12/15/2019    Past Medical History:  Diagnosis Date   History of delayed wound healing 10/2019   Rash and nonspecific skin eruption 10/2019   Type 1 diabetes mellitus (HCC)    Vitamin D deficiency 09/2020   Wound of lower extremity, left, sequela 10/2019    Tobacco History: Social History   Tobacco Use  Smoking Status Every Day   Packs/day: 1.00   Years: 15.00   Additional pack years: 0.00   Total pack years: 15.00   Types: Cigarettes  Smokeless Tobacco Never   Ready to quit: Not Answered Counseling given: Not Answered   Outpatient Encounter Medications as of 04/17/2023   Medication Sig   acetaminophen (TYLENOL) 325 MG tablet Take 2 tablets (650 mg total) by mouth every 6 (six) hours as needed for mild pain (or Fever >/= 101).   docusate sodium (COLACE) 100 MG capsule Take 1 capsule (100 mg total) by mouth 2 (two) times daily.   insulin aspart (NOVOLOG) 100 UNIT/ML injection Use as directed per sliding scale (Patient taking differently: Inject into the skin 3 (three) times daily with meals. Sliding scale for units)   insulin glargine (LANTUS) 100 UNIT/ML injection Inject 0.28 mLs (28 Units total) into the skin at bedtime.   insulin glargine (LANTUS) 100 UNIT/ML injection Inject 0.35 mLs (35 Units total) into the skin once nightly at bedtime.   lisinopril (ZESTRIL) 40 MG tablet Take 1 tablet (40 mg total) by mouth daily.   metoCLOPramide (REGLAN) 5 MG tablet Take 1 tablet (5 mg total) by mouth 4 (four) times daily -  before meals and at bedtime.   ondansetron (ZOFRAN-ODT) 4 MG disintegrating tablet Take 1 tablet (4 mg total) by mouth every 8 (eight) hours as needed for nausea or vomiting.   polyethylene glycol powder (GLYCOLAX/MIRALAX) 17 GM/SCOOP powder Mix 1capful (17 g) with 4-8 ounces with water/juice by mouth 2 (two) times daily as needed.   Probiotic Product (RESTORA) CAPS Take 1  capsule by mouth daily.   Continuous Blood Gluc Receiver (FREESTYLE LIBRE 2 READER) DEVI 1 application. by Does not apply route every 14 (fourteen) days. (Patient not taking: Reported on 04/14/2023)   Continuous Blood Gluc Sensor (FREESTYLE LIBRE 2 SENSOR) MISC Use to check glucose (Patient not taking: Reported on 04/14/2023)   metoCLOPramide (REGLAN) 5 MG tablet Take 1 tablet (5 mg total) by mouth every 8 (eight) hours as needed for nausea or vomiting. (Patient not taking: Reported on 04/14/2023)   nicotine (NICODERM CQ - DOSED IN MG/24 HOURS) 14 mg/24hr patch Place 1 patch (14 mg total) onto the skin daily. (Patient not taking: Reported on 04/14/2023)   No facility-administered encounter  medications on file as of 04/17/2023.     Review of Systems  Review of Systems  Constitutional: Negative.   HENT: Negative.    Cardiovascular: Negative.   Gastrointestinal: Negative.   Allergic/Immunologic: Negative.   Neurological: Negative.   Psychiatric/Behavioral: Negative.         Physical Exam  BP (!) 145/94   Pulse (!) 112   Temp (!) 97.4 F (36.3 C)   Wt 176 lb (79.8 kg)   SpO2 100%   BMI 24.55 kg/m   Wt Readings from Last 5 Encounters:  04/17/23 176 lb (79.8 kg)  04/14/23 178 lb 6.4 oz (80.9 kg)  04/04/23 169 lb 8.5 oz (76.9 kg)  12/18/22 173 lb 6.4 oz (78.7 kg)  07/15/22 185 lb 8 oz (84.1 kg)     Physical Exam Vitals and nursing note reviewed.  Constitutional:      General: He is not in acute distress.    Appearance: He is well-developed.  Cardiovascular:     Rate and Rhythm: Normal rate and regular rhythm.  Pulmonary:     Effort: Pulmonary effort is normal.     Breath sounds: Normal breath sounds.  Skin:    General: Skin is warm and dry.  Neurological:     Mental Status: He is alert and oriented to person, place, and time.      Lab Results:  CBC    Component Value Date/Time   WBC 11.2 (H) 04/14/2023 0941   WBC 9.0 04/07/2023 0137   RBC 4.63 04/14/2023 0941   RBC 4.13 (L) 04/07/2023 0137   HGB 14.7 04/14/2023 0941   HCT 43.3 04/14/2023 0941   PLT 390 04/14/2023 0941   MCV 94 04/14/2023 0941   MCH 31.7 04/14/2023 0941   MCH 32.0 04/07/2023 0137   MCHC 33.9 04/14/2023 0941   MCHC 34.6 04/07/2023 0137   RDW 12.4 04/14/2023 0941   LYMPHSABS 0.8 04/05/2023 0537   LYMPHSABS 2.0 07/13/2021 1158   MONOABS 0.5 04/05/2023 0537   EOSABS 0.0 04/05/2023 0537   EOSABS 0.5 (H) 07/13/2021 1158   BASOSABS 0.0 04/05/2023 0537   BASOSABS 0.1 07/13/2021 1158    BMET    Component Value Date/Time   NA 143 04/14/2023 0941   K 4.5 04/14/2023 0941   CL 104 04/14/2023 0941   CO2 22 04/14/2023 0941   GLUCOSE 54 (L) 04/14/2023 0941   GLUCOSE 177  (H) 04/08/2023 1037   BUN 21 (H) 04/14/2023 0941   CREATININE 1.24 04/14/2023 0941   CALCIUM 10.1 04/14/2023 0941   GFRNONAA >60 04/08/2023 1037   GFRAA 82 10/11/2020 1444    BNP    Component Value Date/Time   BNP 41.9 11/17/2021 0053    ProBNP No results found for: "PROBNP"  Imaging: DG Abd 1 View  Result Date:  04/05/2023 CLINICAL DATA:  Vomiting EXAM: ABDOMEN - 1 VIEW COMPARISON:  Abdomen radiograph done on 04/29/2020, CT done on 11/15/2021 FINDINGS: Bowel gas pattern is nonspecific. No abnormal masses or calcifications are seen. Visualized lower lung fields are clear. IMPRESSION: No radiographic abnormalities are seen in abdomen. Electronically Signed   By: Ernie Avena M.D.   On: 04/05/2023 11:34     Assessment & Plan:   Nausea - CBC - Comprehensive metabolic panel - metoCLOPramide (REGLAN) 5 MG tablet; Take 1 tablet (5 mg total) by mouth 4 (four) times daily -  before meals and at bedtime.  Dispense: 90 tablet; Refill: 2 - ondansetron (ZOFRAN-ODT) 4 MG disintegrating tablet; Take 1 tablet (4 mg total) by mouth every 8 (eight) hours as needed for nausea or vomiting.  Dispense: 20 tablet; Refill: 0  2. Type 1 diabetes mellitus without complication (HCC)  - Ambulatory referral to Endocrinology   Follow up:  Follow up as scheduled - go to the ED if symptoms worsen     Ivonne Andrew, NP 04/17/2023

## 2023-04-17 NOTE — Addendum Note (Signed)
Addended by: Renelda Loma on: 04/17/2023 02:23 PM   Modules accepted: Orders

## 2023-04-17 NOTE — Patient Instructions (Addendum)
1. Nausea and vomiting, unspecified vomiting type  - CBC - Comprehensive metabolic panel - metoCLOPramide (REGLAN) 5 MG tablet; Take 1 tablet (5 mg total) by mouth 4 (four) times daily -  before meals and at bedtime.  Dispense: 90 tablet; Refill: 2 - ondansetron (ZOFRAN-ODT) 4 MG disintegrating tablet; Take 1 tablet (4 mg total) by mouth every 8 (eight) hours as needed for nausea or vomiting.  Dispense: 20 tablet; Refill: 0  2. Type 1 diabetes mellitus without complication (HCC)  - Ambulatory referral to Endocrinology   Follow up:  Follow up as scheduled - go to the ED if symptoms worsen

## 2023-04-18 ENCOUNTER — Encounter: Payer: Self-pay | Admitting: Nurse Practitioner

## 2023-04-18 ENCOUNTER — Telehealth: Payer: Self-pay | Admitting: Nurse Practitioner

## 2023-04-18 LAB — COMPREHENSIVE METABOLIC PANEL
ALT: 49 IU/L — ABNORMAL HIGH (ref 0–44)
AST: 35 IU/L (ref 0–40)
Albumin: 4.8 g/dL (ref 4.1–5.1)
Alkaline Phosphatase: 69 IU/L (ref 44–121)
BUN/Creatinine Ratio: 12 (ref 9–20)
BUN: 15 mg/dL (ref 6–20)
Bilirubin Total: 0.6 mg/dL (ref 0.0–1.2)
CO2: 21 mmol/L (ref 20–29)
Calcium: 10.2 mg/dL (ref 8.7–10.2)
Chloride: 94 mmol/L — ABNORMAL LOW (ref 96–106)
Creatinine, Ser: 1.28 mg/dL — ABNORMAL HIGH (ref 0.76–1.27)
Globulin, Total: 2.4 g/dL (ref 1.5–4.5)
Glucose: 176 mg/dL — ABNORMAL HIGH (ref 70–99)
Potassium: 4.7 mmol/L (ref 3.5–5.2)
Sodium: 136 mmol/L (ref 134–144)
Total Protein: 7.2 g/dL (ref 6.0–8.5)
eGFR: 74 mL/min/{1.73_m2} (ref >59)

## 2023-04-18 LAB — CBC
Hematocrit: 46 % (ref 37.5–51.0)
Hemoglobin: 15.4 g/dL (ref 13.0–17.7)
MCH: 31 pg (ref 26.6–33.0)
MCHC: 33.5 g/dL (ref 31.5–35.7)
MCV: 93 fL (ref 79–97)
Platelets: 418 10*3/uL (ref 150–450)
RBC: 4.96 x10E6/uL (ref 4.14–5.80)
RDW: 12.6 % (ref 11.6–15.4)
WBC: 11 10*3/uL — ABNORMAL HIGH (ref 3.4–10.8)

## 2023-04-18 NOTE — Telephone Encounter (Signed)
Pt called requesting letter be sent to his my chart excusing him from work yesterday and today due to his medication tonya prescribed him yesterday

## 2023-04-24 ENCOUNTER — Emergency Department (HOSPITAL_COMMUNITY): Payer: Self-pay

## 2023-04-24 ENCOUNTER — Encounter (HOSPITAL_COMMUNITY): Payer: Self-pay | Admitting: Internal Medicine

## 2023-04-24 ENCOUNTER — Observation Stay (HOSPITAL_COMMUNITY)
Admission: EM | Admit: 2023-04-24 | Discharge: 2023-04-25 | Disposition: A | Discharge status: Home / Self Care | Payer: Self-pay | Attending: Internal Medicine | Admitting: Internal Medicine

## 2023-04-24 ENCOUNTER — Other Ambulatory Visit: Payer: Self-pay

## 2023-04-24 DIAGNOSIS — D72829 Elevated white blood cell count, unspecified: Secondary | ICD-10-CM | POA: Insufficient documentation

## 2023-04-24 DIAGNOSIS — D45 Polycythemia vera: Secondary | ICD-10-CM | POA: Insufficient documentation

## 2023-04-24 DIAGNOSIS — E875 Hyperkalemia: Secondary | ICD-10-CM | POA: Insufficient documentation

## 2023-04-24 DIAGNOSIS — R112 Nausea with vomiting, unspecified: Secondary | ICD-10-CM | POA: Diagnosis present

## 2023-04-24 DIAGNOSIS — E109 Type 1 diabetes mellitus without complications: Secondary | ICD-10-CM | POA: Diagnosis present

## 2023-04-24 DIAGNOSIS — E1110 Type 2 diabetes mellitus with ketoacidosis without coma: Secondary | ICD-10-CM | POA: Diagnosis present

## 2023-04-24 DIAGNOSIS — Z79899 Other long term (current) drug therapy: Secondary | ICD-10-CM | POA: Insufficient documentation

## 2023-04-24 DIAGNOSIS — E1010 Type 1 diabetes mellitus with ketoacidosis without coma: Principal | ICD-10-CM | POA: Insufficient documentation

## 2023-04-24 DIAGNOSIS — K226 Gastro-esophageal laceration-hemorrhage syndrome: Secondary | ICD-10-CM | POA: Diagnosis present

## 2023-04-24 DIAGNOSIS — E876 Hypokalemia: Secondary | ICD-10-CM | POA: Insufficient documentation

## 2023-04-24 DIAGNOSIS — F17210 Nicotine dependence, cigarettes, uncomplicated: Secondary | ICD-10-CM | POA: Insufficient documentation

## 2023-04-24 DIAGNOSIS — E871 Hypo-osmolality and hyponatremia: Secondary | ICD-10-CM | POA: Insufficient documentation

## 2023-04-24 DIAGNOSIS — I152 Hypertension secondary to endocrine disorders: Secondary | ICD-10-CM | POA: Diagnosis present

## 2023-04-24 DIAGNOSIS — E1065 Type 1 diabetes mellitus with hyperglycemia: Secondary | ICD-10-CM | POA: Insufficient documentation

## 2023-04-24 DIAGNOSIS — Z794 Long term (current) use of insulin: Secondary | ICD-10-CM | POA: Insufficient documentation

## 2023-04-24 DIAGNOSIS — F17200 Nicotine dependence, unspecified, uncomplicated: Secondary | ICD-10-CM | POA: Diagnosis present

## 2023-04-24 DIAGNOSIS — D696 Thrombocytopenia, unspecified: Secondary | ICD-10-CM | POA: Insufficient documentation

## 2023-04-24 DIAGNOSIS — E1159 Type 2 diabetes mellitus with other circulatory complications: Secondary | ICD-10-CM | POA: Diagnosis present

## 2023-04-24 DIAGNOSIS — N179 Acute kidney failure, unspecified: Secondary | ICD-10-CM | POA: Insufficient documentation

## 2023-04-24 LAB — CBC WITH DIFFERENTIAL/PLATELET
Abs Immature Granulocytes: 0.16 10*3/uL — ABNORMAL HIGH (ref 0.00–0.07)
Basophils Absolute: 0.1 10*3/uL (ref 0.0–0.1)
Basophils Relative: 0 %
Eosinophils Absolute: 0 10*3/uL (ref 0.0–0.5)
Eosinophils Relative: 0 %
HCT: 49.5 % (ref 39.0–52.0)
Hemoglobin: 17.1 g/dL — ABNORMAL HIGH (ref 13.0–17.0)
Immature Granulocytes: 1 %
Lymphocytes Relative: 5 %
Lymphs Abs: 1.3 10*3/uL (ref 0.7–4.0)
MCH: 31.7 pg (ref 26.0–34.0)
MCHC: 34.5 g/dL (ref 30.0–36.0)
MCV: 91.7 fL (ref 80.0–100.0)
Monocytes Absolute: 0.4 10*3/uL (ref 0.1–1.0)
Monocytes Relative: 2 %
Neutro Abs: 22.7 10*3/uL — ABNORMAL HIGH (ref 1.7–7.7)
Neutrophils Relative %: 92 %
Platelets: 427 10*3/uL — ABNORMAL HIGH (ref 150–400)
RBC: 5.4 MIL/uL (ref 4.22–5.81)
RDW: 12.4 % (ref 11.5–15.5)
WBC: 24.7 10*3/uL — ABNORMAL HIGH (ref 4.0–10.5)
nRBC: 0 % (ref 0.0–0.2)

## 2023-04-24 LAB — BASIC METABOLIC PANEL
Anion gap: 14 (ref 5–15)
Anion gap: 11 (ref 5–15)
Anion gap: 9 (ref 5–15)
BUN: 26 mg/dL — ABNORMAL HIGH (ref 6–20)
BUN: 28 mg/dL — ABNORMAL HIGH (ref 6–20)
BUN: 25 mg/dL — ABNORMAL HIGH (ref 6–20)
CO2: 23 mmol/L (ref 22–32)
CO2: 24 mmol/L (ref 22–32)
CO2: 25 mmol/L (ref 22–32)
Calcium: 9 mg/dL (ref 8.9–10.3)
Calcium: 8.8 mg/dL — ABNORMAL LOW (ref 8.9–10.3)
Calcium: 9 mg/dL (ref 8.9–10.3)
Chloride: 91 mmol/L — ABNORMAL LOW (ref 98–111)
Chloride: 95 mmol/L — ABNORMAL LOW (ref 98–111)
Chloride: 96 mmol/L — ABNORMAL LOW (ref 98–111)
Creatinine, Ser: 1.77 mg/dL — ABNORMAL HIGH (ref 0.61–1.24)
Creatinine, Ser: 1.64 mg/dL — ABNORMAL HIGH (ref 0.61–1.24)
Creatinine, Ser: 1.47 mg/dL — ABNORMAL HIGH (ref 0.61–1.24)
GFR, Estimated: 50 mL/min — ABNORMAL LOW (ref >60)
GFR, Estimated: 55 mL/min — ABNORMAL LOW (ref >60)
GFR, Estimated: >60 mL/min (ref >60)
Glucose, Bld: 245 mg/dL — ABNORMAL HIGH (ref 70–99)
Glucose, Bld: 242 mg/dL — ABNORMAL HIGH (ref 70–99)
Glucose, Bld: 159 mg/dL — ABNORMAL HIGH (ref 70–99)
Potassium: 4.8 mmol/L (ref 3.5–5.1)
Potassium: 4.6 mmol/L (ref 3.5–5.1)
Potassium: 3.7 mmol/L (ref 3.5–5.1)
Sodium: 128 mmol/L — ABNORMAL LOW (ref 135–145)
Sodium: 130 mmol/L — ABNORMAL LOW (ref 135–145)
Sodium: 130 mmol/L — ABNORMAL LOW (ref 135–145)

## 2023-04-24 LAB — COMPREHENSIVE METABOLIC PANEL
ALT: 29 U/L (ref 0–44)
AST: 26 U/L (ref 15–41)
Albumin: 5.4 g/dL — ABNORMAL HIGH (ref 3.5–5.0)
Alkaline Phosphatase: 69 U/L (ref 38–126)
Anion gap: 18 — ABNORMAL HIGH (ref 5–15)
BUN: 25 mg/dL — ABNORMAL HIGH (ref 6–20)
CO2: 21 mmol/L — ABNORMAL LOW (ref 22–32)
Calcium: 10.3 mg/dL (ref 8.9–10.3)
Chloride: 85 mmol/L — ABNORMAL LOW (ref 98–111)
Creatinine, Ser: 1.93 mg/dL — ABNORMAL HIGH (ref 0.61–1.24)
GFR, Estimated: 45 mL/min — ABNORMAL LOW (ref >60)
Glucose, Bld: 327 mg/dL — ABNORMAL HIGH (ref 70–99)
Potassium: 6.7 mmol/L (ref 3.5–5.1)
Sodium: 124 mmol/L — ABNORMAL LOW (ref 135–145)
Total Bilirubin: 2.6 mg/dL — ABNORMAL HIGH (ref 0.3–1.2)
Total Protein: 9.2 g/dL — ABNORMAL HIGH (ref 6.5–8.1)

## 2023-04-24 LAB — BLOOD GAS, VENOUS
Acid-base deficit: 9.7 mmol/L — ABNORMAL HIGH (ref 0.0–2.0)
Bicarbonate: 14.9 mmol/L — ABNORMAL LOW (ref 20.0–28.0)
O2 Saturation: 80 %
Patient temperature: 37
pCO2, Ven: 29 mmHg — ABNORMAL LOW (ref 44–60)
pH, Ven: 7.32 (ref 7.25–7.43)
pO2, Ven: 45 mmHg (ref 32–45)

## 2023-04-24 LAB — I-STAT CHEM 8, ED
BUN: 26 mg/dL — ABNORMAL HIGH (ref 6–20)
Calcium, Ion: 1.08 mmol/L — ABNORMAL LOW (ref 1.15–1.40)
Chloride: 93 mmol/L — ABNORMAL LOW (ref 98–111)
Creatinine, Ser: 1.8 mg/dL — ABNORMAL HIGH (ref 0.61–1.24)
Glucose, Bld: 337 mg/dL — ABNORMAL HIGH (ref 70–99)
HCT: 53 % — ABNORMAL HIGH (ref 39.0–52.0)
Hemoglobin: 18 g/dL — ABNORMAL HIGH (ref 13.0–17.0)
Potassium: 6.8 mmol/L (ref 3.5–5.1)
Sodium: 122 mmol/L — ABNORMAL LOW (ref 135–145)
TCO2: 23 mmol/L (ref 22–32)

## 2023-04-24 LAB — GLUCOSE, CAPILLARY
Glucose-Capillary: 135 mg/dL — ABNORMAL HIGH (ref 70–99)
Glucose-Capillary: 148 mg/dL — ABNORMAL HIGH (ref 70–99)
Glucose-Capillary: 172 mg/dL — ABNORMAL HIGH (ref 70–99)
Glucose-Capillary: 179 mg/dL — ABNORMAL HIGH (ref 70–99)
Glucose-Capillary: 196 mg/dL — ABNORMAL HIGH (ref 70–99)
Glucose-Capillary: 239 mg/dL — ABNORMAL HIGH (ref 70–99)
Glucose-Capillary: 243 mg/dL — ABNORMAL HIGH (ref 70–99)
Glucose-Capillary: 292 mg/dL — ABNORMAL HIGH (ref 70–99)

## 2023-04-24 LAB — LACTIC ACID, PLASMA
Lactic Acid, Venous: 2.2 mmol/L (ref 0.5–1.9)
Lactic Acid, Venous: 1.2 mmol/L (ref 0.5–1.9)

## 2023-04-24 LAB — HEMOGLOBIN AND HEMATOCRIT, BLOOD
HCT: 39.9 % (ref 39.0–52.0)
Hemoglobin: 13.5 g/dL (ref 13.0–17.0)

## 2023-04-24 LAB — CBG MONITORING, ED
Glucose-Capillary: 294 mg/dL — ABNORMAL HIGH (ref 70–99)
Glucose-Capillary: 325 mg/dL — ABNORMAL HIGH (ref 70–99)
Glucose-Capillary: 227 mg/dL — ABNORMAL HIGH (ref 70–99)

## 2023-04-24 LAB — BETA-HYDROXYBUTYRIC ACID
Beta-Hydroxybutyric Acid: 3.97 mmol/L — ABNORMAL HIGH (ref 0.05–0.27)
Beta-Hydroxybutyric Acid: 0.66 mmol/L — ABNORMAL HIGH (ref 0.05–0.27)

## 2023-04-24 LAB — MRSA NEXT GEN BY PCR, NASAL: MRSA by PCR Next Gen: NOT DETECTED

## 2023-04-24 MED ORDER — ONDANSETRON HCL 4 MG/2ML IJ SOLN
4.0000 mg | Freq: Once | INTRAMUSCULAR | Status: AC
  Administered 2023-04-24: 4 mg via INTRAVENOUS
  Filled 2023-04-24: qty 2

## 2023-04-24 MED ORDER — ORAL CARE MOUTH RINSE: 15.0000 mL | OROMUCOSAL | Status: DC | PRN

## 2023-04-24 MED ORDER — SODIUM CHLORIDE 0.9 % IV BOLUS
1000.0000 mL | Freq: Once | INTRAVENOUS | Status: AC
  Administered 2023-04-24: 1000 mL via INTRAVENOUS

## 2023-04-24 MED ORDER — PANTOPRAZOLE SODIUM 40 MG IV SOLR
40.0000 mg | Freq: Two times a day (BID) | INTRAVENOUS | Status: DC
  Administered 2023-04-24 – 2023-04-25 (×3): 40 mg via INTRAVENOUS
  Filled 2023-04-24 (×3): qty 10

## 2023-04-24 MED ORDER — PROMETHAZINE (PHENERGAN) 6.25MG IN NS 50ML IVPB
6.2500 mg | Freq: Once | INTRAVENOUS | Status: AC
  Administered 2023-04-24: 6.25 mg via INTRAVENOUS
  Filled 2023-04-24: qty 6.25

## 2023-04-24 MED ORDER — ONDANSETRON HCL 4 MG PO TABS: 4.0000 mg | ORAL_TABLET | Freq: Four times a day (QID) | ORAL | Status: DC | PRN

## 2023-04-24 MED ORDER — LABETALOL HCL 5 MG/ML IV SOLN: 20.0000 mg | INTRAVENOUS | Status: DC | PRN

## 2023-04-24 MED ORDER — INSULIN REGULAR(HUMAN) IN NACL 100-0.9 UT/100ML-% IV SOLN
INTRAVENOUS | Status: DC
  Administered 2023-04-24: 8 [IU]/h via INTRAVENOUS
  Filled 2023-04-24: qty 100

## 2023-04-24 MED ORDER — LABETALOL HCL 5 MG/ML IV SOLN
20.0000 mg | Freq: Once | INTRAVENOUS | Status: AC
  Administered 2023-04-24: 20 mg via INTRAVENOUS
  Filled 2023-04-24: qty 4

## 2023-04-24 MED ORDER — CHLORHEXIDINE GLUCONATE CLOTH 2 % EX PADS
6.0000 | MEDICATED_PAD | Freq: Every day | CUTANEOUS | Status: DC
  Administered 2023-04-24: 6 via TOPICAL

## 2023-04-24 MED ORDER — DIAZEPAM 5 MG/ML IJ SOLN
5.0000 mg | Freq: Four times a day (QID) | INTRAMUSCULAR | Status: DC | PRN
  Administered 2023-04-24: 5 mg via INTRAVENOUS
  Filled 2023-04-24: qty 2

## 2023-04-24 MED ORDER — INSULIN ASPART 100 UNIT/ML IV SOLN
5.0000 [IU] | Freq: Once | INTRAVENOUS | Status: AC
  Administered 2023-04-24: 5 [IU] via INTRAVENOUS
  Filled 2023-04-24: qty 0.05

## 2023-04-24 MED ORDER — ACETAMINOPHEN 325 MG PO TABS: 650.0000 mg | ORAL_TABLET | Freq: Four times a day (QID) | ORAL | Status: DC | PRN

## 2023-04-24 MED ORDER — METOCLOPRAMIDE HCL 5 MG/ML IJ SOLN
10.0000 mg | Freq: Four times a day (QID) | INTRAMUSCULAR | Status: AC
  Administered 2023-04-24 – 2023-04-25 (×2): 10 mg via INTRAVENOUS
  Filled 2023-04-24 (×2): qty 2

## 2023-04-24 MED ORDER — DEXTROSE 50 % IV SOLN: 0.0000 mL | INTRAVENOUS | Status: DC | PRN

## 2023-04-24 MED ORDER — LACTATED RINGERS IV BOLUS
1000.0000 mL | Freq: Once | INTRAVENOUS | Status: AC
  Administered 2023-04-24: 1000 mL via INTRAVENOUS

## 2023-04-24 MED ORDER — SCOPOLAMINE 1 MG/3DAYS TD PT72
1.0000 | MEDICATED_PATCH | TRANSDERMAL | Status: DC
  Administered 2023-04-24: 1.5 mg via TRANSDERMAL
  Filled 2023-04-24: qty 1

## 2023-04-24 MED ORDER — ONDANSETRON HCL 4 MG/2ML IJ SOLN
4.0000 mg | Freq: Four times a day (QID) | INTRAMUSCULAR | Status: DC | PRN
  Administered 2023-04-24: 4 mg via INTRAVENOUS
  Filled 2023-04-24: qty 2

## 2023-04-24 MED ORDER — NICOTINE 21 MG/24HR TD PT24
21.0000 mg | MEDICATED_PATCH | Freq: Every day | TRANSDERMAL | Status: DC | PRN
  Administered 2023-04-24: 21 mg via TRANSDERMAL
  Filled 2023-04-24: qty 1

## 2023-04-24 MED ORDER — DEXTROSE IN LACTATED RINGERS 5 % IV SOLN: INTRAVENOUS | Status: DC

## 2023-04-24 MED ORDER — PROMETHAZINE (PHENERGAN) 6.25MG IN NS 50ML IVPB: 6.2500 mg | Freq: Four times a day (QID) | INTRAVENOUS | Status: DC | PRN

## 2023-04-24 MED ORDER — CALCIUM GLUCONATE 10 % IV SOLN
1.0000 g | Freq: Once | INTRAVENOUS | Status: AC
  Administered 2023-04-24: 1 g via INTRAVENOUS
  Filled 2023-04-24: qty 10

## 2023-04-24 MED ORDER — METOCLOPRAMIDE HCL 5 MG/ML IJ SOLN
10.0000 mg | Freq: Once | INTRAMUSCULAR | Status: AC
  Administered 2023-04-24: 10 mg via INTRAVENOUS
  Filled 2023-04-24: qty 2

## 2023-04-24 MED ORDER — ACETAMINOPHEN 650 MG RE SUPP: 650.0000 mg | Freq: Four times a day (QID) | RECTAL | Status: DC | PRN

## 2023-04-24 MED ORDER — LACTATED RINGERS IV SOLN: INTRAVENOUS | Status: DC

## 2023-04-24 MED ORDER — DIPHENHYDRAMINE HCL 50 MG/ML IJ SOLN
25.0000 mg | Freq: Once | INTRAMUSCULAR | Status: AC
  Administered 2023-04-24: 25 mg via INTRAVENOUS
  Filled 2023-04-24: qty 1

## 2023-04-24 NOTE — ED Notes (Signed)
K- 6.8  RN and Provider notified

## 2023-04-24 NOTE — ED Provider Notes (Signed)
[place] EMERGENCY DEPARTMENT AT [facility] Provider Note   CSN: [id number] Arrival date & time: 04/24/23  0920     History Chief Complaint  Patient presents with   Nausea   Emesis    hyper   Hyperglycemia    HPI Zachary Vazquez is a 37 y.o. male presenting for chief complaint of intolerance to p.o. intake.  37 year old male history of diabetes, complicated by intractable nausea and vomiting with complication Mallory-Weiss tears presenting with a chief complaint of intolerance of p.o. intake.  States that since 3 AM this morning is been unable to keep anything down.  He tried taking his home Zofran without interval improvement.  Substantial nonbilious vomiting throughout the morning.  Has become bloody into the day. He denies fevers chills, chest pain, shortness of breath.  States he has been taking his medication as prescribed.   Patient's recorded medical, surgical, social, medication list and allergies were reviewed in the Snapshot window as part of the initial history.   Review of Systems   Review of Systems  Constitutional:  Negative for chills and fever.  HENT:  Negative for ear pain and sore throat.   Eyes:  Negative for pain and visual disturbance.  Respiratory:  Negative for cough and shortness of breath.   Cardiovascular:  Negative for chest pain and palpitations.  Gastrointestinal:  Positive for abdominal pain, nausea and vomiting. Negative for diarrhea.  Genitourinary:  Negative for dysuria and hematuria.  Musculoskeletal:  Negative for arthralgias and back pain.  Skin:  Negative for color change and rash.  Neurological:  Negative for seizures and syncope.  All other systems reviewed and are negative.   Physical Exam Updated Vital Signs BP (!) 154/103   Pulse (!) 125   Temp 98.1 F (36.7 C) (Oral)   Resp 18   SpO2 100%  Physical Exam Vitals and nursing note reviewed.  Constitutional:      General: He is not in acute distress.     Appearance: He is well-developed.  HENT:     Head: Normocephalic and atraumatic.  Eyes:     Conjunctiva/sclera: Conjunctivae normal.  Cardiovascular:     Rate and Rhythm: Normal rate and regular rhythm.     Heart sounds: No murmur heard. Pulmonary:     Effort: Pulmonary effort is normal. No respiratory distress.     Breath sounds: Normal breath sounds.  Abdominal:     Palpations: Abdomen is soft.     Tenderness: There is abdominal tenderness. There is guarding.  Musculoskeletal:        General: No swelling.     Cervical back: Neck supple.  Skin:    General: Skin is warm and dry.     Capillary Refill: Capillary refill takes less than 2 seconds.  Neurological:     Mental Status: He is alert.  Psychiatric:        Mood and Affect: Mood normal.      ED Course/ Medical Decision Making/ A&P Clinical Course as of 04/24/23 1233  Thu Apr 24, 2023  1157 DKA and hyper glycemia [CC]    Clinical Course User Index [CC] Glyn Ade, MD    Procedures .Critical Care  Performed by: Glyn Ade, MD Authorized by: Glyn Ade, MD   Critical care provider statement:    Critical care time (minutes):  95   Critical care was necessary to treat or prevent imminent or life-threatening deterioration of the following conditions:  Metabolic crisis, endocrine crisis, dehydration and shock  Critical care was time spent personally by me on the following activities:  Development of treatment plan with patient or surrogate, discussions with consultants, evaluation of patient's response to treatment, examination of patient, ordering and review of laboratory studies, ordering and review of radiographic studies, ordering and performing treatments and interventions, pulse oximetry, re-evaluation of patient's condition and review of old charts   Care discussed with: admitting provider      Medications Ordered in ED Medications  insulin regular, human (MYXREDLIN) 100 units/ 100 mL  infusion (8 Units/hr Intravenous New Bag/Given 04/24/23 1122)  lactated ringers infusion ( Intravenous New Bag/Given 04/24/23 1118)  dextrose 5 % in lactated ringers infusion (has no administration in time range)  dextrose 50 % solution 0-50 mL (has no administration in time range)  lactated ringers bolus 1,000 mL (0 mLs Intravenous Stopped 04/24/23 1122)  metoCLOPramide (REGLAN) injection 10 mg (10 mg Intravenous Given 04/24/23 0959)  calcium gluconate inj 10% (1 g) URGENT USE ONLY! (1 g Intravenous Given 04/24/23 1037)  insulin aspart (novoLOG) injection 5 Units (5 Units Intravenous Given 04/24/23 1036)  ondansetron (ZOFRAN) injection 4 mg (4 mg Intravenous Given 04/24/23 1036)  sodium chloride 0.9 % bolus 1,000 mL (1,000 mLs Intravenous New Bag/Given 04/24/23 1216)   Medical Decision Making:   Zachary Vazquez is a 37 y.o. male who presented to the ED today with abdominal pain, detailed above.    Patient's presentation is complicated by their history of multiple comorbid medical problems.  Patient placed on continuous vitals and telemetry monitoring while in ED which was reviewed periodically.  Complete initial physical exam performed, notably the patient  was with substantial nausea and vomiting.  Hypertensive, slightly tachycardic.  Initially hyperglycemic with blood glucose 300.     Reviewed and confirmed nursing documentation for past medical history, family history, social history.    Initial Assessment:   With the patient's presentation of abdominal pain, most likely diagnosis is recurrent diabetic ketoacidosis, cyclic vomiting syndrome. Other diagnoses were considered including (but not limited to) gastroenteritis, colitis, small bowel obstruction, appendicitis, cholecystitis, pancreatitis, nephrolithiasis, UTI, pyleonephritis. These are considered less likely due to history of present illness and physical exam findings.   This is most consistent with an acute life/limb threatening illness  complicated by underlying chronic conditions.   Initial Plan:  CBC/CMP to evaluate for underlying infectious/metabolic etiology for patient's abdominal pain  Lipase to evaluate for pancreatitis  EKG to evaluate for cardiac source of pain  Beta-hydroxybutyrate, VBG, lactic acid to evaluate for diabetic ketoacidosis Urinalysis and repeat physical assessment to evaluate for UTI/Pyelonpehritis  Empiric management of symptoms with escalating pain control and antiemetics as needed.   Initial Study Results:   Laboratory  I was emergently called with hyperkalemia results. Additionally patient with elevated beta hydroxybutyrate, glucose and anion gap acidosis. EKG EKG was reviewed independently. Rate, rhythm, axis, intervals all examined and without medically relevant abnormality. ST segments without concerns for elevations.  Patient with hyperacute T waves  Radiology All images reviewed independently. Agree with radiology report at this time.   DG Chest Portable 1 View  Result Date: 04/24/2023 CLINICAL DATA:  37 year old male with hyperglycemia. Shortness of breath, nausea vomiting. EXAM: PORTABLE CHEST 1 VIEW COMPARISON:  Chest radiographs 11/14/2021 and earlier. FINDINGS: Portable AP semi upright view at 1034 hours. Normalized lung volumes. Normal cardiac size and mediastinal contours. Visualized tracheal air column is within normal limits. Allowing for portable technique the lungs are clear. No pneumothorax or pleural effusion. No osseous abnormality  identified. Paucity of bowel gas in the visible abdomen. IMPRESSION: Negative portable chest. Electronically Signed   By: Odessa Fleming M.D.   On: 04/24/2023 10:58   DG Abd 1 View  Result Date: 04/05/2023 CLINICAL DATA:  Vomiting EXAM: ABDOMEN - 1 VIEW COMPARISON:  Abdomen radiograph done on 04/29/2020, CT done on 11/15/2021 FINDINGS: Bowel gas pattern is nonspecific. No abnormal masses or calcifications are seen. Visualized lower lung fields are clear.  IMPRESSION: No radiographic abnormalities are seen in abdomen. Electronically Signed   By: Ernie Avena M.D.   On: 04/05/2023 11:34     Consults: Case discussed with hospitalist.   Final Reassessment and Plan:   Patient's history of present illness and physical exam findings are most consistent with diabetic ketoacidosis with resulting renal insufficiency.  Developing hyperkalemia.  Treated with insulin dextrose and calcium gluconate for stabilization of his hyperkalemia followed by insulin drip for stabilization of his DKA.  Patient symptomatically improved after administration of his medications started on DKA protocol treatment arrange for admission for further care and management.  Disposition:   Based on the above findings, I believe this patient is stable for admission.    Patient/family educated about specific findings on our evaluation and explained exact reasons for admission.  Patient/family educated about clinical situation and time was allowed to answer questions.   Admission team communicated with and agreed with need for admission. Patient admitted. Patient ready to move at this time.     Emergency Department Medication Summary:   Medications  insulin regular, human (MYXREDLIN) 100 units/ 100 mL infusion (8 Units/hr Intravenous New Bag/Given 04/24/23 1122)  lactated ringers infusion ( Intravenous New Bag/Given 04/24/23 1118)  dextrose 5 % in lactated ringers infusion (has no administration in time range)  dextrose 50 % solution 0-50 mL (has no administration in time range)  lactated ringers bolus 1,000 mL (0 mLs Intravenous Stopped 04/24/23 1122)  metoCLOPramide (REGLAN) injection 10 mg (10 mg Intravenous Given 04/24/23 0959)  calcium gluconate inj 10% (1 g) URGENT USE ONLY! (1 g Intravenous Given 04/24/23 1037)  insulin aspart (novoLOG) injection 5 Units (5 Units Intravenous Given 04/24/23 1036)  ondansetron (ZOFRAN) injection 4 mg (4 mg Intravenous Given 04/24/23 1036)   sodium chloride 0.9 % bolus 1,000 mL (1,000 mLs Intravenous New Bag/Given 04/24/23 1216)            Clinical Impression:  1. Diabetic ketoacidosis without coma associated with type 1 diabetes mellitus (HCC)   2. Hyperkalemia      Admit   Final Clinical Impression(s) / ED Diagnoses Final diagnoses:  Diabetic ketoacidosis without coma associated with type 1 diabetes mellitus (HCC)  Hyperkalemia    Rx / DC Orders ED Discharge Orders     None         Glyn Ade, MD 04/24/23 1610

## 2023-04-24 NOTE — ED Notes (Signed)
This NA informed MD pt is heavily sweating and actively vomiting blood.

## 2023-04-24 NOTE — ED Notes (Signed)
Pt was provided with a urinal and explained we need a urine sample.

## 2023-04-24 NOTE — ED Notes (Signed)
ED TO INPATIENT HANDOFF REPORT  ED Nurse Name and Phone #: Lona Kettle Name/Age/Gender Zachary Vazquez 37 y.o. male Room/Bed: WA13/WA13  Code Status   Code Status: Prior  Home/SNF/Other Home Patient oriented to: self, place, time, and situation Is this baseline? Yes   Triage Complete: Triage complete  Chief Complaint DKA (diabetic ketoacidosis) (HCC) [E11.10]  Triage Note EMS reports from home, N/V and hyperglycemia since last night. Pt states took Rx of Zofran this morning without relief.  BP 150/98 HR 120 RR 16 Sp02 100 RA CBG 298    Allergies Allergies  Allergen Reactions   Clindamycin/Lincomycin Anaphylaxis   Bactrim [Sulfamethoxazole-Trimethoprim] Swelling and Other (See Comments)    Eyes swell and bumps appear on/near the eyes     Level of Care/Admitting Diagnosis ED Disposition     ED Disposition  Admit   Condition  --   Comment  Hospital Area: [facility] [[id number]]  Level of Care: Stepdown [14]  Admit to SDU based on following criteria: Severe physiological/psychological symptoms:  Any diagnosis requiring assessment & intervention at least every 4 hours on an ongoing basis to obtain desired patient outcomes including stability and rehabilitation  May place patient in observation at [facility] or [facility] if equivalent level of care is available:: No  Covid Evaluation: Asymptomatic - no recent exposure (last 10 days) testing not required  Diagnosis: DKA (diabetic ketoacidosis) [facility]) [[id number]]  Admitting Physician: Bobette Mo [[id number]]  Attending Physician: Bobette Mo [[id number]]          B Medical/Surgery History Past Medical History:  Diagnosis Date   History of delayed wound healing 10/2019   Rash and nonspecific skin eruption 10/2019   Type 1 diabetes mellitus (HCC)    Vitamin D deficiency 09/2020   Wound of lower extremity, left, sequela 10/2019   Past Surgical History:  Procedure Laterality Date    TONSILLECTOMY       A IV Location/Drains/Wounds Patient Lines/Drains/Airways Status     Active Line/Drains/Airways     Name Placement date Placement time Site Days   Peripheral IV 04/24/23 20 G Anterior;Left Forearm 04/24/23  0958  Forearm  less than 1            Intake/Output Last 24 hours No intake or output data in the 24 hours ending 04/24/23 1409  Labs/Imaging Results for orders placed or performed during the hospital encounter of 04/24/23 (from the past 48 hour(s))  CBG monitoring, ED     Status: Abnormal   Collection Time: 04/24/23  9:26 AM  Result Value Ref Range   Glucose-Capillary 294 (H) 70 - 99 mg/dL    Comment: Glucose reference range applies only to samples taken after fasting for at least 8 hours.  CBC with Differential     Status: Abnormal   Collection Time: 04/24/23  9:58 AM  Result Value Ref Range   WBC 24.7 (H) 4.0 - 10.5 K/uL   RBC 5.40 4.22 - 5.81 MIL/uL   Hemoglobin 17.1 (H) 13.0 - 17.0 g/dL   HCT 40.1 02.7 - 25.3 %   MCV 91.7 80.0 - 100.0 fL   MCH 31.7 26.0 - 34.0 pg   MCHC 34.5 30.0 - 36.0 g/dL   RDW 66.4 40.3 - 47.4 %   Platelets 427 (H) 150 - 400 K/uL   nRBC 0.0 0.0 - 0.2 %   Neutrophils Relative % 92 %   Neutro Abs 22.7 (H) 1.7 - 7.7 K/uL   Lymphocytes Relative  5 %   Lymphs Abs 1.3 0.7 - 4.0 K/uL   Monocytes Relative 2 %   Monocytes Absolute 0.4 0.1 - 1.0 K/uL   Eosinophils Relative 0 %   Eosinophils Absolute 0.0 0.0 - 0.5 K/uL   Basophils Relative 0 %   Basophils Absolute 0.1 0.0 - 0.1 K/uL   Immature Granulocytes 1 %   Abs Immature Granulocytes 0.16 (H) 0.00 - 0.07 K/uL    Comment: Performed at [facility], [address] [place], [place] [address]  Comprehensive metabolic panel     Status: Abnormal   Collection Time: 04/24/23  9:58 AM  Result Value Ref Range   Sodium 124 (L) 135 - 145 mmol/L   Potassium 6.7 (HH) 3.5 - 5.1 mmol/L    Comment: CRITICAL RESULT CALLED TO, READ BACK BY AND VERIFIED  WITH&R& CRAWFORD,D. RN AT 1048 04/24/23 MULLINS,T    Chloride 85 (L) 98 - 111 mmol/L   CO2 21 (L) 22 - 32 mmol/L   Glucose, Bld 327 (H) 70 - 99 mg/dL    Comment: Glucose reference range applies only to samples taken after fasting for at least 8 hours.   BUN 25 (H) 6 - 20 mg/dL   Creatinine, Ser 6.44 (H) 0.61 - 1.24 mg/dL   Calcium 03.4 8.9 - 74.2 mg/dL   Total Protein 9.2 (H) 6.5 - 8.1 g/dL   Albumin 5.4 (H) 3.5 - 5.0 g/dL   AST 26 15 - 41 U/L   ALT 29 0 - 44 U/L   Alkaline Phosphatase 69 38 - 126 U/L   Total Bilirubin 2.6 (H) 0.3 - 1.2 mg/dL   GFR, Estimated 45 (L) >60 mL/min    Comment: (NOTE) Calculated using the CKD-EPI Creatinine Equation (2021)    Anion gap 18 (H) 5 - 15    Comment: Performed at [facility], [address] [place], [place] [address]  Blood gas, venous     Status: Abnormal   Collection Time: 04/24/23  9:58 AM  Result Value Ref Range   pH, Ven 7.32 7.25 - 7.43   pCO2, Ven 29 (L) 44 - 60 mmHg   pO2, Ven 45 32 - 45 mmHg   Bicarbonate 14.9 (L) 20.0 - 28.0 mmol/L   Acid-base deficit 9.7 (H) 0.0 - 2.0 mmol/L   O2 Saturation 80 %   Patient temperature 37.0     Comment: Performed at [facility], [address], [place], [place] [address]  Beta-hydroxybutyric acid     Status: Abnormal   Collection Time: 04/24/23  9:58 AM  Result Value Ref Range   Beta-Hydroxybutyric Acid 3.97 (H) 0.05 - 0.27 mmol/L    Comment: Performed at [facility], [address] [place], [place] [address]  Lactic acid, plasma     Status: Abnormal   Collection Time: 04/24/23 10:05 AM  Result Value Ref Range   Lactic Acid, Venous 2.2 (HH) 0.5 - 1.9 mmol/L    Comment: CRITICAL RESULT CALLED TO, READ BACK BY AND VERIFIED WITH DAWN, C. RN AT 1106 ON 04/24/2023 BY MECIAL J. Performed at [facility], [address], [place], [place] [address]   I-stat chem 8, ED (not at [facility], [facility] or [facility])     Status: Abnormal    Collection Time: 04/24/23 10:08 AM  Result Value Ref Range   Sodium 122 (L) 135 - 145 mmol/L   Potassium 6.8 (HH) 3.5 - 5.1 mmol/L   Chloride 93 (L) 98 -  111 mmol/L   BUN 26 (H) 6 - 20 mg/dL   Creatinine, Ser 1.61 (H) 0.61 - 1.24 mg/dL   Glucose, Bld 096 (H) 70 - 99 mg/dL    Comment: Glucose reference range applies only to samples taken after fasting for at least 8 hours.   Calcium, Ion 1.08 (L) 1.15 - 1.40 mmol/L   TCO2 23 22 - 32 mmol/L   Hemoglobin 18.0 (H) 13.0 - 17.0 g/dL   HCT 04.5 (H) 40.9 - 81.1 %   Comment NOTIFIED PHYSICIAN   CBG monitoring, ED     Status: Abnormal   Collection Time: 04/24/23 12:06 PM  Result Value Ref Range   Glucose-Capillary 325 (H) 70 - 99 mg/dL    Comment: Glucose reference range applies only to samples taken after fasting for at least 8 hours.  Lactic acid, plasma     Status: None   Collection Time: 04/24/23  1:20 PM  Result Value Ref Range   Lactic Acid, Venous 1.2 0.5 - 1.9 mmol/L    Comment: Performed at [facility], [address], [place], [place] [address]  Basic metabolic panel     Status: Abnormal   Collection Time: 04/24/23  1:20 PM  Result Value Ref Range   Sodium 128 (L) 135 - 145 mmol/L   Potassium 4.8 3.5 - 5.1 mmol/L   Chloride 91 (L) 98 - 111 mmol/L   CO2 23 22 - 32 mmol/L   Glucose, Bld 245 (H) 70 - 99 mg/dL    Comment: Glucose reference range applies only to samples taken after fasting for at least 8 hours.   BUN 26 (H) 6 - 20 mg/dL   Creatinine, Ser 2.95 (H) 0.61 - 1.24 mg/dL   Calcium 9.0 8.9 - 62.1 mg/dL   GFR, Estimated 50 (L) >60 mL/min    Comment: (NOTE) Calculated using the CKD-EPI Creatinine Equation (2021)    Anion gap 14 5 - 15    Comment: Performed at  [facility], [address], [place], [place] [address]  CBG monitoring, ED     Status: Abnormal   Collection Time: 04/24/23  1:20 PM  Result Value Ref Range   Glucose-Capillary 227 (H) 70 - 99 mg/dL    Comment: Glucose  reference range applies only to samples taken after fasting for at least 8 hours.   DG Chest Portable 1 View  Result Date: 04/24/2023 CLINICAL DATA:  37 year old male with hyperglycemia. Shortness of breath, nausea vomiting. EXAM: PORTABLE CHEST 1 VIEW COMPARISON:  Chest radiographs 11/14/2021 and earlier. FINDINGS: Portable AP semi upright view at 1034 hours. Normalized lung volumes. Normal cardiac size and mediastinal contours. Visualized tracheal air column is within normal limits. Allowing for portable technique the lungs are clear. No pneumothorax or pleural effusion. No osseous abnormality identified. Paucity of bowel gas in the visible abdomen. IMPRESSION: Negative portable chest. Electronically Signed   By: Odessa Fleming M.D.   On: 04/24/2023 10:58    Pending Labs Unresulted Labs (From admission, onward)     Start     Ordered   04/24/23 1051  Basic metabolic panel  (Diabetes Ketoacidosis (DKA))  STAT Now then every 4 hours ,   STAT      04/24/23 1051   04/24/23 1051  Urinalysis, Routine w reflex microscopic -Urine, Clean Catch  (Diabetes Ketoacidosis (DKA))  ONCE - STAT,   URGENT       Question:  Specimen Source  Answer:  Urine, Clean Catch   04/24/23  1051            Vitals/Pain Today's Vitals   04/24/23 1300 04/24/23 1315 04/24/23 1330 04/24/23 1345  BP: (!) 157/99 (!) 159/91 (!) 199/107 (!) 170/105  Pulse: (!) 112 (!) 115 (!) 125 (!) 117  Resp: (!) 8 (!) 9 (!) 33 12  Temp: 98.1 F (36.7 C)     TempSrc:      SpO2: 100% 100% 100% 99%  PainSc:        Isolation Precautions No active isolations  Medications Medications  insulin regular, human (MYXREDLIN) 100 units/ 100 mL infusion (3.6 Units/hr Intravenous Rate/Dose Change 04/24/23 1324)  lactated ringers infusion (0 mLs Intravenous Stopped 04/24/23 1322)  dextrose 5 % in lactated ringers infusion ( Intravenous New Bag/Given 04/24/23 1322)  dextrose 50 % solution 0-50 mL (has no administration in time range)  pantoprazole  (PROTONIX) injection 40 mg (40 mg Intravenous Given 04/24/23 1401)  promethazine (PHENERGAN) 6.25 mg/NS 50 mL IVPB (6.25 mg Intravenous New Bag/Given 04/24/23 1401)  promethazine (PHENERGAN) 6.25 mg/NS 50 mL IVPB (has no administration in time range)  lactated ringers bolus 1,000 mL (0 mLs Intravenous Stopped 04/24/23 1122)  metoCLOPramide (REGLAN) injection 10 mg (10 mg Intravenous Given 04/24/23 0959)  calcium gluconate inj 10% (1 g) URGENT USE ONLY! (1 g Intravenous Given 04/24/23 1037)  insulin aspart (novoLOG) injection 5 Units (5 Units Intravenous Given 04/24/23 1036)  ondansetron (ZOFRAN) injection 4 mg (4 mg Intravenous Given 04/24/23 1036)  sodium chloride 0.9 % bolus 1,000 mL (0 mLs Intravenous Stopped 04/24/23 1322)  labetalol (NORMODYNE) injection 20 mg (20 mg Intravenous Given 04/24/23 1401)    Mobility walks     Focused Assessments Hyperglycemia. Hyperkalemia   R Recommendations: See Admitting Provider Note  Report given to:   Additional Notes: .

## 2023-04-24 NOTE — Inpatient Diabetes Management (Signed)
Inpatient Diabetes Program Recommendations  AACE/ADA: New Consensus Statement on Inpatient Glycemic Control (2015)  Target Ranges:  Prepandial:   less than 140 mg/dL      Peak postprandial:   less than 180 mg/dL (1-2 hours)      Critically ill patients:  140 - 180 mg/dL   Lab Results  Component Value Date   GLUCAP 292 (H) 04/24/2023   HGBA1C 6.4 (A) 04/14/2023    Review of Glycemic Control  Latest Reference Range & Units 04/24/23 09:26 04/24/23 12:06 04/24/23 13:20 04/24/23 14:42  Glucose-Capillary 70 - 99 mg/dL 865 (H) 784 (H) 696 (H) 292 (H)   Diabetes history: DM 1 Outpatient Diabetes medications:  Lantus 28 units daily, Novolog for CHO coverage and correction Current orders for Inpatient glycemic control:  IV insulin/DKA orders  Inpatient Diabetes Program Recommendations:    Agree with current orders.  Once patient is ready for transition of , consider Semglee 20 units daily, Novolog 0-9 units tid with meals and HS, and Novolog meal coverage 3 units tid with meals.   Thanks,  Beryl Meager, RN, BC-ADM Inpatient Diabetes Coordinator Pager [phone] (8a-5p)

## 2023-04-24 NOTE — H&P (Signed)
History and Physical    Patient: Zachary Vazquez EXB:[id number] DOB: 1986-02-26 DOA: 04/24/2023 DOS: the patient was seen and examined on 04/24/2023 PCP: Ivonne Andrew, NP  Patient coming from: Home  Chief Complaint:  Chief Complaint  Patient presents with   Nausea   Emesis    hyper   Hyperglycemia   HPI: Zachary Vazquez is a 37 y.o. male with medical history significant of type 1 diabetes, vitamin D deficiency, delayed wound healing, tobacco use, gastritis, history intractable nausea and vomiting, Mallory-Weiss tear who presented to the emergency department with complaints of hyperglycemia, abdominal pain, nausea, multiple episodes of emesis with some having small amounts of blood.  He tried Zofran at home without significant relief.  He stated he has been compliant with his diabetes treatment.   No diarrhea, constipation, melena or hematochezia.  No flank pain, dysuria, frequency or hematuria. He denied fever, chills, rhinorrhea, sore throat, wheezing or hemoptysis.  No chest pain, palpitations, diaphoresis, PND, orthopnea or pitting edema of the lower extremities.  ED course: Initial vital signs were temperature 98.1 F, pulse 118, respiration 18, BP 200/108 mmHg O2 sat 100% on room air.  The patient received metoclopramide 10 mg IVP, ondansetron 4 mg IVP, NovoLog 5 units IVP x 1 followed by an insulin infusion, calcium gluconate 1 g IVPB, 1000 mL of NS bolus and 1000 mL of LR bolus.  I added another 1000 normal saline bolus, Phenergan 6.25 mg IVP and Normodyne 20 mg IVP.  Lab work: CBC showed a white count 24.7, hemoglobin 17.1 g deciliter platelets 427.  Lactic acid is 2.2 then 1.2 mmol/L.  CMP with a sodium 124, potassium 6.7, chloride 85 and CO2 21 mmol/L.  Total bilirubin 2.6, glucose 327, BUN 25, creatinine 1.93 and calcium 10.3 mg/dL.  Total protein 9.2 and albumin 5.4 g deciliter.  Transaminases and alkaline phosphatase were normal.  Beta hydroxybutyric acid 3.97  mmol/L.  Imaging: Portable 1 view chest radiograph was negative.   Review of Systems: As mentioned in the history of present illness. All other systems reviewed and are negative. Past Medical History:  Diagnosis Date   History of delayed wound healing 10/2019   Rash and nonspecific skin eruption 10/2019   Type 1 diabetes mellitus (HCC)    Vitamin D deficiency 09/2020   Wound of lower extremity, left, sequela 10/2019   Past Surgical History:  Procedure Laterality Date   TONSILLECTOMY     Social History:  reports that he has been smoking cigarettes. He has a 15.00 pack-year smoking history. He has never used smokeless tobacco. He reports current alcohol use. He reports that he does not currently use drugs after having used the following drugs: Marijuana.  Allergies  Allergen Reactions   Clindamycin/Lincomycin Anaphylaxis   Bactrim [Sulfamethoxazole-Trimethoprim] Swelling and Other (See Comments)    Eyes swell and bumps appear on/near the eyes     Family History  Problem Relation Age of Onset   Hypertension Mother    Diabetes Mellitus I Brother     Prior to Admission medications   Medication Sig Start Date End Date Taking? Authorizing Provider  acetaminophen (TYLENOL) 325 MG tablet Take 2 tablets (650 mg total) by mouth every 6 (six) hours as needed for mild pain (or Fever >/= 101). 04/08/23   Vonna Drafts, MD  Continuous Blood Gluc Receiver (FREESTYLE LIBRE 2 READER) DEVI 1 application. by Does not apply route every 14 (fourteen) days. Patient not taking: Reported on 04/14/2023 01/10/22   Thad Ranger  M, NP  Continuous Blood Gluc Sensor (FREESTYLE LIBRE 2 SENSOR) MISC Use to check glucose Patient not taking: Reported on 04/14/2023 08/21/22   Ivonne Andrew, NP  docusate sodium (COLACE) 100 MG capsule Take 1 capsule (100 mg total) by mouth 2 (two) times daily. 04/14/23   Ivonne Andrew, NP  insulin aspart (NOVOLOG) 100 UNIT/ML injection Use as directed per sliding scale Patient  taking differently: Inject into the skin 3 (three) times daily with meals. Sliding scale for units 07/15/22   Ivonne Andrew, NP  insulin glargine (LANTUS) 100 UNIT/ML injection Inject 0.28 mLs (28 Units total) into the skin at bedtime. 04/14/23   Ivonne Andrew, NP  insulin glargine (LANTUS) 100 UNIT/ML injection Inject 0.35 mLs (35 Units total) into the skin once nightly at bedtime. 04/14/23   Ivonne Andrew, NP  lisinopril (ZESTRIL) 40 MG tablet Take 1 tablet (40 mg total) by mouth daily. 04/08/23   Vonna Drafts, MD  metoCLOPramide (REGLAN) 5 MG tablet Take 1 tablet (5 mg total) by mouth every 8 (eight) hours as needed for nausea or vomiting. Patient not taking: Reported on 04/14/2023 04/08/23   Vonna Drafts, MD  metoCLOPramide (REGLAN) 5 MG tablet Take 1 tablet (5 mg total) by mouth 4 (four) times daily -  before meals and at bedtime. 04/17/23   Ivonne Andrew, NP  nicotine (NICODERM CQ - DOSED IN MG/24 HOURS) 14 mg/24hr patch Place 1 patch (14 mg total) onto the skin daily. Patient not taking: Reported on 04/14/2023 04/08/23   Vonna Drafts, MD  ondansetron (ZOFRAN-ODT) 4 MG disintegrating tablet Take 1 tablet (4 mg total) by mouth every 8 (eight) hours as needed for nausea or vomiting. 04/17/23   Ivonne Andrew, NP  polyethylene glycol powder (GLYCOLAX/MIRALAX) 17 GM/SCOOP powder Mix 1capful (17 g) with 4-8 ounces with water/juice by mouth 2 (two) times daily as needed. 04/14/23   Ivonne Andrew, NP  Probiotic Product (RESTORA) CAPS Take 1 capsule by mouth daily. 04/14/23   Ivonne Andrew, NP    Physical Exam: Vitals:   04/24/23 1144 04/24/23 1145 04/24/23 1146 04/24/23 1150  BP:  (!) 140/79  (!) 154/103  Pulse:  (!) 113  (!) 125  Resp: (!) 7  (!) 5 18  Temp:      TempSrc:      SpO2: 100%   100%   Physical Exam Vitals and nursing note reviewed.  HENT:     Head: Normocephalic.     Nose: No rhinorrhea.     Mouth/Throat:     Mouth: Mucous membranes are dry.  Eyes:     General:  No scleral icterus.    Pupils: Pupils are equal, round, and reactive to light.  Cardiovascular:     Rate and Rhythm: Regular rhythm. Tachycardia present.  Pulmonary:     Effort: Pulmonary effort is normal.     Breath sounds: Normal breath sounds.  Abdominal:     General: Bowel sounds are normal.     Palpations: Abdomen is soft.  Musculoskeletal:     Cervical back: Neck supple.     Right lower leg: No edema.     Left lower leg: No edema.  Skin:    General: Skin is warm and dry.  Neurological:     General: No focal deficit present.     Mental Status: He is alert and oriented to person, place, and time.  Psychiatric:        Mood and Affect: Mood  normal.        Behavior: Behavior normal.     Data Reviewed:  Results are pending, will review when available.  Assessment and Plan: Principal Problem:   Type 1 diabetes mellitus (HCC) Presenting with:   DKA (diabetic ketoacidosis) (HCC) Observation/stepdown. Keep NPO. Continue IV fluids. Continue insulin infusion. Monitor CBG closely. BMP every 4 hours. BHA every 8 hours. Replace electrolytes as needed. Consult diabetes coordinator. Transition to SQ insulin per Endo tool.  Active Problems:   Nausea and vomiting History of:   Mallory-Weiss tear Had some hematemesis earlier. Aggressive antiemetic therapy. Pantoprazole 40 mg IVP every 12 hours. Consult gastroenterology if no improvement or if he worsens.    AKI (acute kidney injury) (HCC) Continue IV fluids. Hold ARB/ACE. Avoid hypotension. Avoid nephrotoxins. Monitor intake and output. Monitor renal function/electrolytes.    Hyperkalemia Secondary to acidosis. Already resolved.    Leukocytosis No fever or other obvious symptoms of infection. Continue DKA treatment and follow WBC. Sepsis workup if he becomes febrile.    Hypertension associated with diabetes (HCC) Hold lisinopril for now. Parenteral antihypertensives as needed.    Tobacco use  disorder Tobacco cessation advised. Nicotine replacement therapy ordered as needed.      Advance Care Planning:   Code Status: Full Code   Consults:   Family Communication:   Severity of Illness: The appropriate patient status for this patient is OBSERVATION. Observation status is judged to be reasonable and necessary in order to provide the required intensity of service to ensure the patient's safety. The patient's presenting symptoms, physical exam findings, and initial radiographic and laboratory data in the context of their medical condition is felt to place them at decreased risk for further clinical deterioration. Furthermore, it is anticipated that the patient will be medically stable for discharge from the hospital within 2 midnights of admission.   Author: Bobette Mo, MD 04/24/2023 12:46 PM  For on call review [email or web address].   This document was prepared using Dragon voice recognition software and may contain some unintended transcription errors.

## 2023-04-24 NOTE — ED Triage Notes (Signed)
EMS reports from home, N/V and hyperglycemia since last night. Pt states took Rx of Zofran this morning without relief.  BP 150/98 HR 120 RR 16 Sp02 100 RA CBG 298

## 2023-04-25 ENCOUNTER — Other Ambulatory Visit: Payer: Self-pay

## 2023-04-25 LAB — GLUCOSE, CAPILLARY
Glucose-Capillary: 119 mg/dL — ABNORMAL HIGH (ref 70–99)
Glucose-Capillary: 124 mg/dL — ABNORMAL HIGH (ref 70–99)
Glucose-Capillary: 141 mg/dL — ABNORMAL HIGH (ref 70–99)
Glucose-Capillary: 145 mg/dL — ABNORMAL HIGH (ref 70–99)
Glucose-Capillary: 159 mg/dL — ABNORMAL HIGH (ref 70–99)
Glucose-Capillary: 171 mg/dL — ABNORMAL HIGH (ref 70–99)
Glucose-Capillary: 202 mg/dL — ABNORMAL HIGH (ref 70–99)

## 2023-04-25 LAB — COMPREHENSIVE METABOLIC PANEL
ALT: 17 U/L (ref 0–44)
AST: 13 U/L — ABNORMAL LOW (ref 15–41)
Albumin: 3.4 g/dL — ABNORMAL LOW (ref 3.5–5.0)
Alkaline Phosphatase: 44 U/L (ref 38–126)
Anion gap: 10 (ref 5–15)
BUN: 19 mg/dL (ref 6–20)
CO2: 26 mmol/L (ref 22–32)
Calcium: 8.9 mg/dL (ref 8.9–10.3)
Chloride: 101 mmol/L (ref 98–111)
Creatinine, Ser: 1.4 mg/dL — ABNORMAL HIGH (ref 0.61–1.24)
GFR, Estimated: >60 mL/min (ref >60)
Glucose, Bld: 131 mg/dL — ABNORMAL HIGH (ref 70–99)
Potassium: 3.5 mmol/L (ref 3.5–5.1)
Sodium: 137 mmol/L (ref 135–145)
Total Bilirubin: 0.8 mg/dL (ref 0.3–1.2)
Total Protein: 5.7 g/dL — ABNORMAL LOW (ref 6.5–8.1)

## 2023-04-25 LAB — CBC
HCT: 36.8 % — ABNORMAL LOW (ref 39.0–52.0)
Hemoglobin: 12.3 g/dL — ABNORMAL LOW (ref 13.0–17.0)
MCH: 31.8 pg (ref 26.0–34.0)
MCHC: 33.4 g/dL (ref 30.0–36.0)
MCV: 95.1 fL (ref 80.0–100.0)
Platelets: 256 10*3/uL (ref 150–400)
RBC: 3.87 MIL/uL — ABNORMAL LOW (ref 4.22–5.81)
RDW: 12.8 % (ref 11.5–15.5)
WBC: 9.9 10*3/uL (ref 4.0–10.5)
nRBC: 0 % (ref 0.0–0.2)

## 2023-04-25 LAB — BASIC METABOLIC PANEL
Anion gap: 10 (ref 5–15)
BUN: 21 mg/dL — ABNORMAL HIGH (ref 6–20)
CO2: 26 mmol/L (ref 22–32)
Calcium: 8.9 mg/dL (ref 8.9–10.3)
Chloride: 102 mmol/L (ref 98–111)
Creatinine, Ser: 1.4 mg/dL — ABNORMAL HIGH (ref 0.61–1.24)
GFR, Estimated: >60 mL/min (ref >60)
Glucose, Bld: 130 mg/dL — ABNORMAL HIGH (ref 70–99)
Potassium: 3.4 mmol/L — ABNORMAL LOW (ref 3.5–5.1)
Sodium: 138 mmol/L (ref 135–145)

## 2023-04-25 LAB — BETA-HYDROXYBUTYRIC ACID: Beta-Hydroxybutyric Acid: 0.64 mmol/L — ABNORMAL HIGH (ref 0.05–0.27)

## 2023-04-25 MED ORDER — INSULIN ASPART 100 UNIT/ML IJ SOLN: 0.0000 [IU] | Freq: Every day | INTRAMUSCULAR | Status: DC

## 2023-04-25 MED ORDER — INSULIN ASPART 100 UNIT/ML IJ SOLN
0.0000 [IU] | Freq: Three times a day (TID) | INTRAMUSCULAR | Status: DC
  Administered 2023-04-25: 2 [IU] via SUBCUTANEOUS

## 2023-04-25 MED ORDER — INSULIN GLARGINE-YFGN 100 UNIT/ML ~~LOC~~ SOLN
20.0000 [IU] | SUBCUTANEOUS | Status: DC
  Administered 2023-04-25: 20 [IU] via SUBCUTANEOUS
  Filled 2023-04-25: qty 0.2

## 2023-04-25 MED ORDER — PANTOPRAZOLE SODIUM 40 MG PO TBEC
40.0000 mg | DELAYED_RELEASE_TABLET | Freq: Two times a day (BID) | ORAL | 0 refills | Status: DC
  Filled 2023-04-25: qty 30, 15d supply, fill #0

## 2023-04-25 NOTE — Discharge Summary (Signed)
Physician Discharge Summary  Zachary Vazquez ZOX:[id number] DOB: 06-05-1986 DOA: 04/24/2023  PCP: Ivonne Andrew, NP  Admit date: 04/24/2023 Discharge date: 04/25/2023  Admitted From: Home Disposition: Home  Recommendations for Outpatient Follow-up:  Follow up with PCP in 1 week with repeat CBC/BMP Abstain from marijuana Comply with medications and follow-up Follow up in ED if symptoms worsen or new appear   Home Health: No Equipment/Devices: None  Discharge Condition: Stable CODE STATUS: Full Diet recommendation: Carb modified diet  Brief/Interim Summary: 37 year old male with history of diabetes mellitus type 1, tobacco use, gastritis, recent hospitalization and discharged from 04/03/2023-04/08/2023 for DKA with intractable nausea and vomiting presented with nausea, vomiting and hyperglycemia.  On presentation, he was found to have leukocytosis with acute kidney injury and DKA.  He was started on IV fluids and insulin drip.  Subsequently, his condition has improved.  He has already been switched to long-acting insulin and he has tolerated breakfast this morning.  He wants to go home today.  He will be discharged home today and he needs to continue his home insulin regimen.  Outpatient follow-up with PCP  Discharge Diagnoses:   DKA in a patient with diabetes mellitus type 1 -Presented in DKA, was treated with IV fluids and insulin drip. - Subsequently, his condition has improved.  He has already been switched to long-acting insulin and he has tolerated breakfast this morning.  He wants to go home today.  He will be discharged home today and he needs to continue his home insulin regimen.  Outpatient follow-up with PCP  Intractable nausea and vomiting -Possibly from DKA.  Might be associated with marijuana use.  Counseled regarding abstinence from marijuana.  Might be associated with gastritis. -Continue antiemetics as needed.  Tolerated breakfast this morning.  Wants to go home  today.  Discharge home on PPI twice a day for 2 weeks.  Might need outpatient GI evaluation if symptoms persist.  Hyperkalemia -resolved  Hypokalemia Resolved  Hyponatremia -Resolved  AKI -From DKA, nausea and vomiting and dehydration.  Treated with IV fluids.  Creatinine stable.  Outpatient follow-up  Hypertension -Blood pressure stable.  Hold lisinopril for now.  Outpatient follow-up with PCP  Leukocytosis Resolved  Thrombocytosis -Resolved  Polycythemia -Possibly from hemoconcentration from dehydration.  Hemoglobin has improved  Tobacco use disorder -Tobacco cessation discussed by admitting hospitalist.  Outpatient follow-up with PCP.  Discharge Instructions  Discharge Instructions     Diet - low sodium heart healthy   Complete by: As directed    Diet Carb Modified   Complete by: As directed    Increase activity slowly   Complete by: As directed       Allergies as of 04/25/2023       Reactions   Clindamycin/lincomycin Anaphylaxis   Bactrim [sulfamethoxazole-trimethoprim] Swelling, Other (See Comments)   Eyes swell and bumps appear on/near the eyes         Medication List     STOP taking these medications    lisinopril 40 MG tablet Commonly known as: ZESTRIL   nicotine 14 mg/24hr patch Commonly known as: NICODERM CQ - dosed in mg/24 hours       TAKE these medications    acetaminophen 325 MG tablet Commonly known as: TYLENOL Take 2 tablets (650 mg total) by mouth every 6 (six) hours as needed for mild pain (or Fever >/= 101).   docusate sodium 100 MG capsule Commonly known as: Colace Take 1 capsule (100 mg total) by mouth 2 (two) times  daily.   FreeStyle Libre 2 [facility] 1 application. by Does not apply route every 14 (fourteen) days.   FreeStyle Libre 2 Sensor Misc Use to check glucose   Lantus 100 UNIT/ML injection Generic drug: insulin glargine Inject 0.28 mLs (28 Units total) into the skin at bedtime. What changed: Another  medication with the same name was removed. Continue taking this medication, and follow the directions you see here.   metoCLOPramide 5 MG tablet Commonly known as: REGLAN Take 1 tablet (5 mg total) by mouth every 8 (eight) hours as needed for nausea or vomiting.   metoCLOPramide 5 MG tablet Commonly known as: Reglan Take 1 tablet (5 mg total) by mouth 4 (four) times daily -  before meals and at bedtime.   NovoLOG 100 UNIT/ML injection Generic drug: insulin aspart Use as directed per sliding scale What changed:  how to take this when to take this additional instructions   ondansetron 4 MG disintegrating tablet Commonly known as: ZOFRAN-ODT Take 1 tablet (4 mg total) by mouth every 8 (eight) hours as needed for nausea or vomiting.   pantoprazole 40 MG tablet Commonly known as: Protonix Take 1 tablet (40 mg total) by mouth 2 (two) times daily before a meal for 15 days.   polyethylene glycol powder 17 GM/SCOOP powder Commonly known as: GLYCOLAX/MIRALAX Mix 1capful (17 g) with 4-8 ounces with water/juice by mouth 2 (two) times daily as needed.   Restora Caps Take 1 capsule by mouth daily.        Follow-up Information     Connect with your PCP/Specialist as discussed. Schedule an appointment as soon as possible for a visit .   Contact information: [email or web address] Call our physician referral line at [phone].        Ivonne Andrew, NP. Schedule an appointment as soon as possible for a visit in 1 week(s).   Specialties: Pulmonary Disease, Endocrinology Contact information: [address] [place] [address] [phone]                Allergies  Allergen Reactions   Clindamycin/Lincomycin Anaphylaxis   Bactrim [Sulfamethoxazole-Trimethoprim] Swelling and Other (See Comments)    Eyes swell and bumps appear on/near the eyes     Consultations: None   Procedures/Studies: DG Chest Portable 1 View  Result Date:  04/24/2023 CLINICAL DATA:  37 year old male with hyperglycemia. Shortness of breath, nausea vomiting. EXAM: PORTABLE CHEST 1 VIEW COMPARISON:  Chest radiographs 11/14/2021 and earlier. FINDINGS: Portable AP semi upright view at 1034 hours. Normalized lung volumes. Normal cardiac size and mediastinal contours. Visualized tracheal air column is within normal limits. Allowing for portable technique the lungs are clear. No pneumothorax or pleural effusion. No osseous abnormality identified. Paucity of bowel gas in the visible abdomen. IMPRESSION: Negative portable chest. Electronically Signed   By: Odessa Fleming M.D.   On: 04/24/2023 10:58   DG Abd 1 View  Result Date: 04/05/2023 CLINICAL DATA:  Vomiting EXAM: ABDOMEN - 1 VIEW COMPARISON:  Abdomen radiograph done on 04/29/2020, CT done on 11/15/2021 FINDINGS: Bowel gas pattern is nonspecific. No abnormal masses or calcifications are seen. Visualized lower lung fields are clear. IMPRESSION: No radiographic abnormalities are seen in abdomen. Electronically Signed   By: Ernie Avena M.D.   On: 04/05/2023 11:34      Subjective: Patient seen and examined at bedside.  Feels better and denies any current nausea or vomiting.  Tolerated his breakfast this morning.  Wants to go home.  No  fever or chest pain reported.  Discharge Exam: Vitals:   04/25/23 0813 04/25/23 0830  BP: (!) 146/93   Pulse: 91   Resp: 19   Temp:  98.6 F (37 C)  SpO2: 98%     General: Pt is alert, awake, not in acute distress.  On room air. Cardiovascular: rate controlled, S1/S2 + Respiratory: bilateral decreased breath sounds at bases Abdominal: Soft, NT, ND, bowel sounds + Extremities: no edema, no cyanosis    The results of significant diagnostics from this hospitalization (including imaging, microbiology, ancillary and laboratory) are listed below for reference.     Microbiology: Recent Results (from the past 240 hour(s))  MRSA Next Gen by PCR, Nasal     Status: None    Collection Time: 04/24/23  4:54 PM   Specimen: Nasal Mucosa; Nasal Swab  Result Value Ref Range Status   MRSA by PCR Next Gen NOT DETECTED NOT DETECTED Final    Comment: (NOTE) The GeneXpert MRSA Assay (FDA approved for NASAL specimens only), is one component of a comprehensive MRSA colonization surveillance program. It is not intended to diagnose MRSA infection nor to guide or monitor treatment for MRSA infections. Test performance is not FDA approved in patients less than 76 years old. Performed at [facility], [address], [place], [place] [address]      Labs: BNP (last 3 results) No results for input(s): "BNP" in the last 8760 hours. Basic Metabolic Panel: Recent Labs  Lab 04/24/23 1320 04/24/23 1638 04/24/23 2120 04/25/23 0323 04/25/23 0602  NA 128* 130* 130* 138 137  K 4.8 4.6 3.7 3.4* 3.5  CL 91* 95* 96* 102 101  CO2 23 24 25 26 26   GLUCOSE 245* 242* 159* 130* 131*  BUN 26* 28* 25* 21* 19  CREATININE 1.77* 1.64* 1.47* 1.40* 1.40*  CALCIUM 9.0 8.8* 9.0 8.9 8.9   Liver Function Tests: Recent Labs  Lab 04/24/23 0958 04/25/23 0602  AST 26 13*  ALT 29 17  ALKPHOS 69 44  BILITOT 2.6* 0.8  PROT 9.2* 5.7*  ALBUMIN 5.4* 3.4*   No results for input(s): "LIPASE", "AMYLASE" in the last 168 hours. No results for input(s): "AMMONIA" in the last 168 hours. CBC: Recent Labs  Lab 04/24/23 0958 04/24/23 1008 04/24/23 2120 04/25/23 0602  WBC 24.7*  --   --  9.9  NEUTROABS 22.7*  --   --   --   HGB 17.1* 18.0* 13.5 12.3*  HCT 49.5 53.0* 39.9 36.8*  MCV 91.7  --   --  95.1  PLT 427*  --   --  256   Cardiac Enzymes: No results for input(s): "CKTOTAL", "CKMB", "CKMBINDEX", "TROPONINI" in the last 168 hours. BNP: Invalid input(s): "POCBNP" CBG: Recent Labs  Lab 04/25/23 0249 04/25/23 0348 04/25/23 0447 04/25/23 0644 04/25/23 0759  GLUCAP 145* 124* 141* 119* 159*   D-Dimer No results for input(s): "DDIMER" in the last 72 hours. Hgb  A1c No results for input(s): "HGBA1C" in the last 72 hours. Lipid Profile No results for input(s): "CHOL", "HDL", "LDLCALC", "TRIG", "CHOLHDL", "LDLDIRECT" in the last 72 hours. Thyroid function studies No results for input(s): "TSH", "T4TOTAL", "T3FREE", "THYROIDAB" in the last 72 hours.  Invalid input(s): "FREET3" Anemia work up No results for input(s): "VITAMINB12", "FOLATE", "FERRITIN", "TIBC", "IRON", "RETICCTPCT" in the last 72 hours. Urinalysis    Component Value Date/Time   COLORURINE YELLOW 04/03/2023 0753   APPEARANCEUR CLEAR 04/03/2023 0753   APPEARANCEUR Clear 10/11/2020 1442   LABSPEC  1.015 04/17/2023 1421   PHURINE 5.0 04/03/2023 0753   GLUCOSEU >=500 (A) 04/03/2023 0753   HGBUR SMALL (A) 04/03/2023 0753   BILIRUBINUR negative 04/17/2023 1421   BILIRUBINUR neg 01/09/2021 1044   BILIRUBINUR Negative 10/11/2020 1442   KETONESUR small (15) (A) 04/17/2023 1421   KETONESUR 80 (A) 04/03/2023 0753   PROTEINUR =100 (A) 04/17/2023 1421   PROTEINUR 30 (A) 04/03/2023 0753   UROBILINOGEN 1.0 07/13/2021 1101   UROBILINOGEN 0.2 01/19/2018 0838   NITRITE Negative 04/17/2023 1421   NITRITE NEGATIVE 04/03/2023 0753   LEUKOCYTESUR Negative 04/17/2023 1421   LEUKOCYTESUR NEGATIVE 04/03/2023 0753   Sepsis Labs Recent Labs  Lab 04/24/23 0958 04/25/23 0602  WBC 24.7* 9.9   Microbiology Recent Results (from the past 240 hour(s))  MRSA Next Gen by PCR, Nasal     Status: None   Collection Time: 04/24/23  4:54 PM   Specimen: Nasal Mucosa; Nasal Swab  Result Value Ref Range Status   MRSA by PCR Next Gen NOT DETECTED NOT DETECTED Final    Comment: (NOTE) The GeneXpert MRSA Assay (FDA approved for NASAL specimens only), is one component of a comprehensive MRSA colonization surveillance program. It is not intended to diagnose MRSA infection nor to guide or monitor treatment for MRSA infections. Test performance is not FDA approved in patients less than 19 years old. Performed at  [facility], [address] [place], [place] [address]      Time coordinating discharge: 35 minutes  SIGNED:   Glade Lloyd, MD  [facility] 04/25/2023, 10:47 AM

## 2023-04-25 NOTE — Progress Notes (Signed)
Discharge education provided, patient states he will continue to take blood pressure medication despite recommendations from MD (due to acute kidney injury) to stop until he sees PCP again in 1-2 weeks. PIV removed, belongings returned. Patient ambulated safely to main entrance alongside nurse tech.

## 2023-04-25 NOTE — Inpatient Diabetes Management (Signed)
Inpatient Diabetes Program Recommendations  AACE/ADA: New Consensus Statement on Inpatient Glycemic Control (2015)  Target Ranges:  Prepandial:   less than 140 mg/dL      Peak postprandial:   less than 180 mg/dL (1-2 hours)      Critically ill patients:  140 - 180 mg/dL   Lab Results  Component Value Date   GLUCAP 159 (H) 04/25/2023   HGBA1C 6.4 (A) 04/14/2023    Review of Glycemic Control  Latest Reference Range & Units 04/25/23 02:49 04/25/23 03:48 04/25/23 04:47 04/25/23 06:44 04/25/23 07:59  Glucose-Capillary 70 - 99 mg/dL 409 (H) 811 (H) 914 (H) 119 (H) 159 (H)   Diabetes history: DM 1 Outpatient Diabetes medications:  FSL2, Novolog SSI, Lantus 28 units daily Current orders for Inpatient glycemic control:  Novolog 0-9 units tid with meals and HS Semglee 20 units daily  Inpatient Diabetes Program Recommendations:    Spoke at length to patient regarding DM management.  He has excellent control of his blood sugars at home but struggles when he gets nauseas and starts vomiting. We discussed "sick day" rules and the use of Ketone strips for checking for acidosis.  Patient states he has never heard of ketone strips and plans to purchase as soon as he goes home.  We discussed that if ketones are moderate to high, he needs to see MD or come to ED for fluids/insulin.  Discussed what DKA is and the physiology of acidosis and diabetes.  Patient appreciative of information and he really is trying.  He has done an exceptional job considering he has no insurance and has never seen endocrinology.  Encouraged follow up on Medicaid and possibly looking for employment in the future that has good medical benefits.    Thanks,  Beryl Meager, RN, BC-ADM Inpatient Diabetes Coordinator Pager [phone]  (8a-5p)

## 2023-04-25 NOTE — TOC CM/SW Note (Signed)
Transition of Care [facility]) - Inpatient Brief Assessment   Patient Details  Name: Zachary Vazquez MRN: [id number] Date of Birth: [birth year]  Transition of Care [facility]) CM/SW Contact:    Otelia Santee, LCSW Phone Number: 04/25/2023, 10:32 AM   Clinical Narrative: Met with pt in room to discuss insurance/PCP. Pt shares he does not have insurance. He reports applying for Medicaid when he first moved to [facility] however, was denied due to not meeting qualifications at the time. CSW encouraged pt to re-apply for Medicaid as Medicaid has been expanded in [place] and he may now qualify. Pt declined to re-apply due to not wanting to complete an application and risk being denied again.  Pt shares he has PCP services at the [facility] and uses [facility] pharmacy to obtain medications.  Pt denies further needs. TOC signing off at this time.    Transition of Care Asessment: Insurance and Status: Selfpay Patient has primary care physician: Yes Home environment has been reviewed: Home alone Prior level of function:: Independent Prior/Current Home Services: No current home services Social Determinants of Health Reivew: SDOH reviewed no interventions necessary Readmission risk has been reviewed: Yes Transition of care needs: no transition of care needs at this time

## 2023-04-28 ENCOUNTER — Inpatient Hospital Stay: Payer: Self-pay | Admitting: Nurse Practitioner

## 2023-04-28 ENCOUNTER — Telehealth: Payer: Self-pay

## 2023-04-28 NOTE — Transitions of Care (Post Inpatient/ED Visit) (Signed)
04/28/2023  Name: Zachary Vazquez MRN: [id number] DOB: Apr 26, 1986  Today's TOC FU Call Status: Today's TOC FU Call Status:: Successful TOC FU Call Competed TOC FU Call Complete Date: 04/28/23  Transition Care Management Follow-up Telephone Call Date of Discharge: 04/25/23 Discharge Facility: [facility]) Type of Discharge: Inpatient Admission Primary Inpatient Discharge Diagnosis:: DKA (diabetic ketoacidosis) How have you been since you were released from the hospital?: Better Any questions or concerns?: Yes Patient Questions/Concerns:: (S) pt needs endocrinology referral expedited per hospitalists request- pt not in [place] and would like telephone fu - hosp dc BP meds- and BP is " normal and high" at home  Items Reviewed: Did you receive and understand the discharge instructions provided?: Yes Medications obtained,verified, and reconciled?: Yes (Medications Reviewed) Any new allergies since your discharge?: No Dietary orders reviewed?: Yes Do you have support at home?: Yes  Medications Reviewed Today: Medications Reviewed Today     Reviewed by Merleen Nicely, LPN (Licensed Practical Nurse) on 04/28/23 at 1231  Med List Status: <None>   Medication Order Taking? Sig Documenting Provider Last Dose Status Informant  acetaminophen (TYLENOL) 325 MG tablet [id number] Yes Take 2 tablets (650 mg total) by mouth every 6 (six) hours as needed for mild pain (or Fever >/= 101). Vonna Drafts, MD Taking Active   Continuous Blood Gluc Receiver (FREESTYLE LIBRE 2 READER) DEVI [id number] No 1 application. by Does not apply route every 14 (fourteen) days.  Patient not taking: Reported on 04/28/2023   Barbette Merino, NP Not Taking Active Self  Continuous Blood Gluc Sensor (FREESTYLE LIBRE 2 SENSOR) MISC [id number] No Use to check glucose  Patient not taking: Reported on 04/28/2023   Ivonne Andrew, NP Not Taking Active Self  docusate sodium (COLACE) 100 MG capsule [id number] Yes Take 1  capsule (100 mg total) by mouth 2 (two) times daily. Ivonne Andrew, NP Taking Active   insulin aspart (NOVOLOG) 100 UNIT/ML injection [id number] Yes Use as directed per sliding scale  Patient taking differently: Inject into the skin 3 (three) times daily with meals. Sliding scale for units   Ivonne Andrew, NP Taking Active Self, Pharmacy Records           Med Note Antony Madura, Harlin Rain Apr 24, 2023  2:14 PM)    insulin glargine (LANTUS) 100 UNIT/ML injection [id number] Yes Inject 0.28 mLs (28 Units total) into the skin at bedtime. Ivonne Andrew, NP Taking Active   metoCLOPramide (REGLAN) 5 MG tablet [id number] Yes Take 1 tablet (5 mg total) by mouth every 8 (eight) hours as needed for nausea or vomiting. Vonna Drafts, MD Taking Active   ondansetron (ZOFRAN-ODT) 4 MG disintegrating tablet [id number] Yes Take 1 tablet (4 mg total) by mouth every 8 (eight) hours as needed for nausea or vomiting. Ivonne Andrew, NP Taking Active   pantoprazole (PROTONIX) 40 MG tablet [id number] Yes Take 1 tablet (40 mg total) by mouth 2 (two) times daily before a meal for 15 days. Glade Lloyd, MD Taking Active   polyethylene glycol powder (GLYCOLAX/MIRALAX) 17 GM/SCOOP powder [id number] Yes Mix 1capful (17 g) with 4-8 ounces with water/juice by mouth 2 (two) times daily as needed. Ivonne Andrew, NP Taking Active   Probiotic Product [facility]) CAPS [id number] Yes Take 1 capsule by mouth daily. Ivonne Andrew, NP Taking Active             Home Care and Equipment/Supplies: Were Home Health Services Ordered?: No Any new  equipment or medical supplies ordered?: No  Functional Questionnaire: Do you need assistance with bathing/showering or dressing?: No Do you need assistance with meal preparation?: No Do you need assistance with eating?: No Do you have difficulty maintaining continence: No Do you need assistance with getting out of bed/getting out of a chair/moving?: No Do you have difficulty managing  or taking your medications?: No  Follow up appointments reviewed: PCP Follow-up appointment confirmed?: Yes Follow-up Provider: Angus Seller NP [facility] Follow-up appointment confirmed?: NA Do you need transportation to your follow-up appointment?: No Do you understand care options if your condition(s) worsen?: Yes-patient verbalized understanding    SIGNATURE  Woodfin Ganja LPN [facility] Nurse Health Advisor Direct Dial [phone]

## 2023-05-02 ENCOUNTER — Other Ambulatory Visit: Payer: Self-pay

## 2023-05-15 ENCOUNTER — Other Ambulatory Visit: Payer: Self-pay

## 2023-05-15 ENCOUNTER — Encounter (HOSPITAL_COMMUNITY): Payer: Self-pay | Admitting: Emergency Medicine

## 2023-05-15 ENCOUNTER — Emergency Department (HOSPITAL_COMMUNITY)
Admission: EM | Admit: 2023-05-15 | Discharge: 2023-05-16 | Disposition: A | Discharge status: Home / Self Care | Payer: Medicaid Other | Attending: Emergency Medicine | Admitting: Emergency Medicine

## 2023-05-15 DIAGNOSIS — D72829 Elevated white blood cell count, unspecified: Secondary | ICD-10-CM | POA: Insufficient documentation

## 2023-05-15 DIAGNOSIS — E109 Type 1 diabetes mellitus without complications: Secondary | ICD-10-CM | POA: Diagnosis not present

## 2023-05-15 DIAGNOSIS — R197 Diarrhea, unspecified: Secondary | ICD-10-CM | POA: Insufficient documentation

## 2023-05-15 DIAGNOSIS — R112 Nausea with vomiting, unspecified: Secondary | ICD-10-CM | POA: Insufficient documentation

## 2023-05-15 NOTE — ED Triage Notes (Signed)
Pt c/o N/V since this AM states that he thinks he's in DKA. Cbg 107 in triage

## 2023-05-16 ENCOUNTER — Other Ambulatory Visit: Payer: Self-pay

## 2023-05-16 LAB — I-STAT VENOUS BLOOD GAS, ED
Acid-Base Excess: 4 mmol/L — ABNORMAL HIGH (ref 0.0–2.0)
Bicarbonate: 26 mmol/L (ref 20.0–28.0)
Calcium, Ion: 1.06 mmol/L — ABNORMAL LOW (ref 1.15–1.40)
HCT: 51 % (ref 39.0–52.0)
Hemoglobin: 17.3 g/dL — ABNORMAL HIGH (ref 13.0–17.0)
O2 Saturation: 41 %
Potassium: 4.2 mmol/L (ref 3.5–5.1)
Sodium: 126 mmol/L — ABNORMAL LOW (ref 135–145)
TCO2: 27 mmol/L (ref 22–32)
pCO2, Ven: 30 mmHg — ABNORMAL LOW (ref 44–60)
pH, Ven: 7.545 — ABNORMAL HIGH (ref 7.25–7.43)
pO2, Ven: 20 mmHg — CL (ref 32–45)

## 2023-05-16 LAB — COMPREHENSIVE METABOLIC PANEL
ALT: 17 U/L (ref 0–44)
AST: 19 U/L (ref 15–41)
Albumin: 4.7 g/dL (ref 3.5–5.0)
Alkaline Phosphatase: 58 U/L (ref 38–126)
Anion gap: 14 (ref 5–15)
BUN: 14 mg/dL (ref 6–20)
CO2: 21 mmol/L — ABNORMAL LOW (ref 22–32)
Calcium: 10 mg/dL (ref 8.9–10.3)
Chloride: 92 mmol/L — ABNORMAL LOW (ref 98–111)
Creatinine, Ser: 1.35 mg/dL — ABNORMAL HIGH (ref 0.61–1.24)
GFR, Estimated: >60 mL/min (ref >60)
Glucose, Bld: 113 mg/dL — ABNORMAL HIGH (ref 70–99)
Potassium: 3.9 mmol/L (ref 3.5–5.1)
Sodium: 127 mmol/L — ABNORMAL LOW (ref 135–145)
Total Bilirubin: 1.6 mg/dL — ABNORMAL HIGH (ref 0.3–1.2)
Total Protein: 8 g/dL (ref 6.5–8.1)

## 2023-05-16 LAB — CBC
HCT: 48.6 % (ref 39.0–52.0)
Hemoglobin: 16.3 g/dL (ref 13.0–17.0)
MCH: 30.9 pg (ref 26.0–34.0)
MCHC: 33.5 g/dL (ref 30.0–36.0)
MCV: 92.2 fL (ref 80.0–100.0)
Platelets: 294 10*3/uL (ref 150–400)
RBC: 5.27 MIL/uL (ref 4.22–5.81)
RDW: 12 % (ref 11.5–15.5)
WBC: 10.7 10*3/uL — ABNORMAL HIGH (ref 4.0–10.5)
nRBC: 0 % (ref 0.0–0.2)

## 2023-05-16 LAB — BETA-HYDROXYBUTYRIC ACID: Beta-Hydroxybutyric Acid: 0.89 mmol/L — ABNORMAL HIGH (ref 0.05–0.27)

## 2023-05-16 LAB — CBG MONITORING, ED: Glucose-Capillary: 166 mg/dL — ABNORMAL HIGH (ref 70–99)

## 2023-05-16 LAB — LIPASE, BLOOD: Lipase: 24 U/L (ref 11–51)

## 2023-05-16 MED ORDER — METOCLOPRAMIDE HCL 5 MG/ML IJ SOLN
10.0000 mg | Freq: Once | INTRAMUSCULAR | Status: AC
  Administered 2023-05-16: 10 mg via INTRAVENOUS

## 2023-05-16 MED ORDER — SODIUM CHLORIDE 0.9 % IV SOLN
12.5000 mg | Freq: Four times a day (QID) | INTRAVENOUS | Status: DC | PRN
  Administered 2023-05-16: 12.5 mg via INTRAVENOUS
  Filled 2023-05-16 (×2): qty 0.5

## 2023-05-16 MED ORDER — SODIUM CHLORIDE 0.9 % IV BOLUS
1000.0000 mL | Freq: Once | INTRAVENOUS | Status: AC
  Administered 2023-05-16: 1000 mL via INTRAVENOUS

## 2023-05-16 MED ORDER — METOCLOPRAMIDE HCL 5 MG/ML IJ SOLN
INTRAMUSCULAR | Status: AC
  Filled 2023-05-16: qty 2

## 2023-05-16 MED ORDER — METOCLOPRAMIDE HCL 10 MG PO TABS
10.0000 mg | ORAL_TABLET | Freq: Four times a day (QID) | ORAL | 0 refills | Status: DC
  Filled 2023-05-16 (×2): qty 30, 8d supply, fill #0

## 2023-05-16 MED ORDER — ONDANSETRON HCL 4 MG/2ML IJ SOLN
4.0000 mg | Freq: Once | INTRAMUSCULAR | Status: AC
  Administered 2023-05-16: 4 mg via INTRAVENOUS
  Filled 2023-05-16: qty 2

## 2023-05-16 NOTE — ED Provider Notes (Signed)
[facility]EM[facility]ENC[facility] DEPT [facility] Emergency Department Provider Note MRN:  [id number]  Arrival date & time: 05/16/23     Chief Complaint   Emesis   History of Present Illness   Zachary Vazquez is a 37 y.o. year-old male presents to the ED with chief complaint of nausea, vomiting, and diarrhea.  Hx of DM1 and DKA.  States that this feels similar to previous episodes of DKA.  He doesn't use a pump.  Does insuline injections.  States that symptoms just started today.  States that he has been told in the past to come into the hospital early if he's concerned about DKA.  History provided by patient.   Review of Systems  Pertinent positive and negative review of systems noted in HPI.    Physical Exam   Vitals:   05/16/23 0245 05/16/23 0400  BP:  (!) 141/93  Pulse: 83 92  Resp: (!) 29 14  Temp: 98 F (36.7 C)   SpO2: 100% 99%    CONSTITUTIONAL:  uncomfortable-appearing, NAD NEURO:  Alert and oriented x 3, CN 3-12 grossly intact EYES:  eyes equal and reactive ENT/NECK:  Supple, no stridor  CARDIO:  tachycardic, regular rhythm, appears well-perfused  PULM:  No respiratory distress,  GI/GU:  non-distended, generalized abdominal discomfort MSK/SPINE:  No gross deformities, no edema, moves all extremities  SKIN:  no rash, atraumatic   *Additional and/or pertinent findings included in MDM below  Diagnostic and Interventional Summary    EKG Interpretation Date/Time:    Ventricular Rate:    PR Interval:    QRS Duration:    QT Interval:    QTC Calculation:   R Axis:      Text Interpretation:         Labs Reviewed  CBC - Abnormal; Notable for the following components:      Result Value   WBC 10.7 (*)    All other components within normal limits  I-STAT VENOUS BLOOD GAS, ED - Abnormal; Notable for the following components:   pH, Ven 7.545 (*)    pCO2, Ven 30.0 (*)    pO2, Ven 20 (*)    Acid-Base Excess 4.0 (*)    Sodium 126 (*)    Calcium, Ion 1.06 (*)     Hemoglobin 17.3 (*)    All other components within normal limits  CBG MONITORING, ED - Abnormal; Notable for the following components:   Glucose-Capillary 166 (*)    All other components within normal limits  LIPASE, BLOOD  COMPREHENSIVE METABOLIC PANEL  URINALYSIS, ROUTINE W REFLEX MICROSCOPIC  BETA-HYDROXYBUTYRIC ACID    No orders to display    Medications  promethazine (PHENERGAN) 12.5 mg in sodium chloride 0.9 % 50 mL IVPB (0 mg Intravenous Stopped 05/16/23 0528)  sodium chloride 0.9 % bolus 1,000 mL (0 mLs Intravenous Stopped 05/16/23 0315)  ondansetron (ZOFRAN) injection 4 mg (4 mg Intravenous Given 05/16/23 0237)  metoCLOPramide (REGLAN) injection 10 mg (10 mg Intravenous Given 05/16/23 0409)  sodium chloride 0.9 % bolus 1,000 mL (0 mLs Intravenous Stopped 05/16/23 0528)     Procedures  /  Critical Care Procedures  ED Course and Medical Decision Making  I have reviewed the triage vital signs, the nursing notes, and pertinent available records from the EMR.  Social Determinants Affecting Complexity of Care: Patient has no clinically significant social determinants affecting this chief complaint..   ED Course:    Medical Decision Making Patient here with nausea and vomiting.  Onset yesterday morning.  He states that this feels similar to DKA and was told to come in earlier if having these symptoms.  His blood sugars have been in the mid-100s.  Will check labs, give fluids and antiemetics and reassess.  Patient's labs are not consistent with DKA.  Consideration given to the fact that he might be early DKA, and compensating, but glucose is essentially normal. Patient smells of marijuana.  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  Treated with reglan and fluids.  DC to home with return precautions.  Amount and/or Complexity of Data Reviewed Labs: ordered.    Details: Na 126, given 2 liters of NS.   Anion gap is 14 Venous pH is 7.54, doubt DKA. Mild leukocytosis to  10.7  Risk Prescription drug management.         Consultants: No consultations were needed in caring for this patient.   Treatment and Plan: I considered admission due to patient's initial presentation, but after considering the examination and diagnostic results, patient will not require admission and can be discharged with outpatient follow-up.    Final Clinical Impressions(s) / ED Diagnoses     ICD-10-CM   1. Nausea and vomiting, unspecified vomiting type  R11.2       ED Discharge Orders          Ordered    metoCLOPramide (REGLAN) 10 MG tablet  Every 6 hours        05/16/23 0543              Discharge Instructions Discussed with and Provided to Patient:   Discharge Instructions   None      Roxy Horseman, PA-C 05/16/23 0865    Marily Memos, MD 05/16/23 2259

## 2023-05-17 ENCOUNTER — Emergency Department (HOSPITAL_COMMUNITY): Payer: Medicaid Other

## 2023-05-17 ENCOUNTER — Encounter (HOSPITAL_COMMUNITY): Payer: Self-pay | Admitting: Internal Medicine

## 2023-05-17 ENCOUNTER — Inpatient Hospital Stay (HOSPITAL_COMMUNITY)
Admission: EM | Admit: 2023-05-17 | Discharge: 2023-05-20 | DRG: 638 | Disposition: A | Discharge status: Home / Self Care | Payer: Medicaid Other | Attending: Internal Medicine | Admitting: Internal Medicine

## 2023-05-17 ENCOUNTER — Other Ambulatory Visit: Payer: Self-pay

## 2023-05-17 DIAGNOSIS — Z881 Allergy status to other antibiotic agents status: Secondary | ICD-10-CM

## 2023-05-17 DIAGNOSIS — F1290 Cannabis use, unspecified, uncomplicated: Secondary | ICD-10-CM | POA: Diagnosis present

## 2023-05-17 DIAGNOSIS — R739 Hyperglycemia, unspecified: Secondary | ICD-10-CM

## 2023-05-17 DIAGNOSIS — Z794 Long term (current) use of insulin: Secondary | ICD-10-CM | POA: Diagnosis not present

## 2023-05-17 DIAGNOSIS — Z91148 Patient's other noncompliance with medication regimen for other reason: Secondary | ICD-10-CM

## 2023-05-17 DIAGNOSIS — Z833 Family history of diabetes mellitus: Secondary | ICD-10-CM | POA: Diagnosis not present

## 2023-05-17 DIAGNOSIS — F12188 Cannabis abuse with other cannabis-induced disorder: Secondary | ICD-10-CM | POA: Diagnosis present

## 2023-05-17 DIAGNOSIS — R000 Tachycardia, unspecified: Secondary | ICD-10-CM | POA: Diagnosis present

## 2023-05-17 DIAGNOSIS — E1010 Type 1 diabetes mellitus with ketoacidosis without coma: Principal | ICD-10-CM | POA: Diagnosis present

## 2023-05-17 DIAGNOSIS — Z8249 Family history of ischemic heart disease and other diseases of the circulatory system: Secondary | ICD-10-CM | POA: Diagnosis not present

## 2023-05-17 DIAGNOSIS — K3184 Gastroparesis: Secondary | ICD-10-CM | POA: Diagnosis present

## 2023-05-17 DIAGNOSIS — E1043 Type 1 diabetes mellitus with diabetic autonomic (poly)neuropathy: Secondary | ICD-10-CM | POA: Diagnosis present

## 2023-05-17 DIAGNOSIS — F17210 Nicotine dependence, cigarettes, uncomplicated: Secondary | ICD-10-CM | POA: Diagnosis not present

## 2023-05-17 DIAGNOSIS — R112 Nausea with vomiting, unspecified: Secondary | ICD-10-CM | POA: Diagnosis present

## 2023-05-17 DIAGNOSIS — Z882 Allergy status to sulfonamides status: Secondary | ICD-10-CM | POA: Diagnosis not present

## 2023-05-17 DIAGNOSIS — E876 Hypokalemia: Secondary | ICD-10-CM | POA: Diagnosis not present

## 2023-05-17 DIAGNOSIS — Z79899 Other long term (current) drug therapy: Secondary | ICD-10-CM | POA: Diagnosis not present

## 2023-05-17 DIAGNOSIS — R0682 Tachypnea, not elsewhere classified: Secondary | ICD-10-CM | POA: Diagnosis present

## 2023-05-17 DIAGNOSIS — I10 Essential (primary) hypertension: Secondary | ICD-10-CM | POA: Diagnosis present

## 2023-05-17 DIAGNOSIS — N179 Acute kidney failure, unspecified: Secondary | ICD-10-CM | POA: Diagnosis not present

## 2023-05-17 HISTORY — DX: Cannabis abuse with other cannabis-induced disorder: F12.188

## 2023-05-17 HISTORY — DX: Cannabis dependence, uncomplicated: F12.20

## 2023-05-17 LAB — COMPREHENSIVE METABOLIC PANEL
ALT: 21 U/L (ref 0–44)
AST: 22 U/L (ref 15–41)
Albumin: 4.5 g/dL (ref 3.5–5.0)
Alkaline Phosphatase: 60 U/L (ref 38–126)
Anion gap: 20 — ABNORMAL HIGH (ref 5–15)
BUN: 25 mg/dL — ABNORMAL HIGH (ref 6–20)
CO2: 21 mmol/L — ABNORMAL LOW (ref 22–32)
Calcium: 9.6 mg/dL (ref 8.9–10.3)
Chloride: 89 mmol/L — ABNORMAL LOW (ref 98–111)
Creatinine, Ser: 1.97 mg/dL — ABNORMAL HIGH (ref 0.61–1.24)
GFR, Estimated: 44 mL/min — ABNORMAL LOW (ref >60)
Glucose, Bld: 313 mg/dL — ABNORMAL HIGH (ref 70–99)
Potassium: 3.8 mmol/L (ref 3.5–5.1)
Sodium: 130 mmol/L — ABNORMAL LOW (ref 135–145)
Total Bilirubin: 1.2 mg/dL (ref 0.3–1.2)
Total Protein: 7.4 g/dL (ref 6.5–8.1)

## 2023-05-17 LAB — BASIC METABOLIC PANEL
Anion gap: 12 (ref 5–15)
BUN: 23 mg/dL — ABNORMAL HIGH (ref 6–20)
CO2: 28 mmol/L (ref 22–32)
Calcium: 9.1 mg/dL (ref 8.9–10.3)
Chloride: 95 mmol/L — ABNORMAL LOW (ref 98–111)
Creatinine, Ser: 1.69 mg/dL — ABNORMAL HIGH (ref 0.61–1.24)
GFR, Estimated: 53 mL/min — ABNORMAL LOW (ref >60)
Glucose, Bld: 190 mg/dL — ABNORMAL HIGH (ref 70–99)
Potassium: 3.8 mmol/L (ref 3.5–5.1)
Sodium: 135 mmol/L (ref 135–145)

## 2023-05-17 LAB — CBG MONITORING, ED
Glucose-Capillary: 291 mg/dL — ABNORMAL HIGH (ref 70–99)
Glucose-Capillary: 270 mg/dL — ABNORMAL HIGH (ref 70–99)
Glucose-Capillary: 203 mg/dL — ABNORMAL HIGH (ref 70–99)
Glucose-Capillary: 191 mg/dL — ABNORMAL HIGH (ref 70–99)
Glucose-Capillary: 199 mg/dL — ABNORMAL HIGH (ref 70–99)
Glucose-Capillary: 168 mg/dL — ABNORMAL HIGH (ref 70–99)
Glucose-Capillary: 185 mg/dL — ABNORMAL HIGH (ref 70–99)
Glucose-Capillary: 192 mg/dL — ABNORMAL HIGH (ref 70–99)
Glucose-Capillary: 162 mg/dL — ABNORMAL HIGH (ref 70–99)
Glucose-Capillary: 150 mg/dL — ABNORMAL HIGH (ref 70–99)

## 2023-05-17 LAB — CBC
HCT: 40.7 % (ref 39.0–52.0)
Hemoglobin: 14 g/dL (ref 13.0–17.0)
MCH: 31.3 pg (ref 26.0–34.0)
MCHC: 34.4 g/dL (ref 30.0–36.0)
MCV: 90.8 fL (ref 80.0–100.0)
Platelets: 295 10*3/uL (ref 150–400)
RBC: 4.48 MIL/uL (ref 4.22–5.81)
RDW: 12.1 % (ref 11.5–15.5)
WBC: 15.7 10*3/uL — ABNORMAL HIGH (ref 4.0–10.5)
nRBC: 0 % (ref 0.0–0.2)

## 2023-05-17 LAB — I-STAT CHEM 8, ED
BUN: 27 mg/dL — ABNORMAL HIGH (ref 6–20)
Calcium, Ion: 1 mmol/L — ABNORMAL LOW (ref 1.15–1.40)
Chloride: 92 mmol/L — ABNORMAL LOW (ref 98–111)
Creatinine, Ser: 1.9 mg/dL — ABNORMAL HIGH (ref 0.61–1.24)
Glucose, Bld: 316 mg/dL — ABNORMAL HIGH (ref 70–99)
HCT: 52 % (ref 39.0–52.0)
Hemoglobin: 17.7 g/dL — ABNORMAL HIGH (ref 13.0–17.0)
Potassium: 4.3 mmol/L (ref 3.5–5.1)
Sodium: 133 mmol/L — ABNORMAL LOW (ref 135–145)
TCO2: 27 mmol/L (ref 22–32)

## 2023-05-17 LAB — RAPID URINE DRUG SCREEN, HOSP PERFORMED
Amphetamines: NOT DETECTED
Barbiturates: NOT DETECTED
Benzodiazepines: NOT DETECTED
Cocaine: NOT DETECTED
Opiates: NOT DETECTED
Tetrahydrocannabinol: POSITIVE — AB

## 2023-05-17 LAB — I-STAT VENOUS BLOOD GAS, ED
Acid-Base Excess: 9 mmol/L — ABNORMAL HIGH (ref 0.0–2.0)
Acid-Base Excess: 8 mmol/L — ABNORMAL HIGH (ref 0.0–2.0)
Bicarbonate: 27.7 mmol/L (ref 20.0–28.0)
Bicarbonate: 28.3 mmol/L — ABNORMAL HIGH (ref 20.0–28.0)
Calcium, Ion: 1.08 mmol/L — ABNORMAL LOW (ref 1.15–1.40)
Calcium, Ion: 1.09 mmol/L — ABNORMAL LOW (ref 1.15–1.40)
HCT: 52 % (ref 39.0–52.0)
HCT: 52 % (ref 39.0–52.0)
Hemoglobin: 17.7 g/dL — ABNORMAL HIGH (ref 13.0–17.0)
Hemoglobin: 17.7 g/dL — ABNORMAL HIGH (ref 13.0–17.0)
O2 Saturation: 49 %
O2 Saturation: 61 %
Potassium: 4.4 mmol/L (ref 3.5–5.1)
Potassium: 4.6 mmol/L (ref 3.5–5.1)
Sodium: 132 mmol/L — ABNORMAL LOW (ref 135–145)
Sodium: 132 mmol/L — ABNORMAL LOW (ref 135–145)
TCO2: 28 mmol/L (ref 22–32)
TCO2: 29 mmol/L (ref 22–32)
pCO2, Ven: 23.7 mmHg — ABNORMAL LOW (ref 44–60)
pCO2, Ven: 28.8 mmHg — ABNORMAL LOW (ref 44–60)
pH, Ven: 7.677 (ref 7.25–7.43)
pH, Ven: 7.601 (ref 7.25–7.43)
pO2, Ven: 20 mmHg — CL (ref 32–45)
pO2, Ven: 25 mmHg — CL (ref 32–45)

## 2023-05-17 LAB — GLUCOSE, CAPILLARY: Glucose-Capillary: 167 mg/dL — ABNORMAL HIGH (ref 70–99)

## 2023-05-17 LAB — BETA-HYDROXYBUTYRIC ACID: Beta-Hydroxybutyric Acid: 3.56 mmol/L — ABNORMAL HIGH (ref 0.05–0.27)

## 2023-05-17 LAB — MAGNESIUM: Magnesium: 1.7 mg/dL (ref 1.7–2.4)

## 2023-05-17 MED ORDER — SODIUM CHLORIDE 0.9 % IV BOLUS
1000.0000 mL | Freq: Once | INTRAVENOUS | Status: AC
  Administered 2023-05-17: 1000 mL via INTRAVENOUS

## 2023-05-17 MED ORDER — ACETAMINOPHEN 650 MG RE SUPP: 650.0000 mg | Freq: Four times a day (QID) | RECTAL | Status: DC | PRN

## 2023-05-17 MED ORDER — ALBUTEROL SULFATE (2.5 MG/3ML) 0.083% IN NEBU: 2.5000 mg | INHALATION_SOLUTION | Freq: Four times a day (QID) | RESPIRATORY_TRACT | Status: DC | PRN

## 2023-05-17 MED ORDER — DEXTROSE IN LACTATED RINGERS 5 % IV SOLN: INTRAVENOUS | Status: DC

## 2023-05-17 MED ORDER — INSULIN REGULAR(HUMAN) IN NACL 100-0.9 UT/100ML-% IV SOLN
INTRAVENOUS | Status: DC
  Administered 2023-05-17: 6.5 [IU]/h via INTRAVENOUS

## 2023-05-17 MED ORDER — LACTATED RINGERS IV SOLN: INTRAVENOUS | Status: DC

## 2023-05-17 MED ORDER — SODIUM CHLORIDE 0.9 % IV SOLN
25.0000 mg | Freq: Four times a day (QID) | INTRAVENOUS | Status: DC | PRN
  Administered 2023-05-17 – 2023-05-18 (×3): 25 mg via INTRAVENOUS
  Filled 2023-05-17: qty 25
  Filled 2023-05-17 (×3): qty 1

## 2023-05-17 MED ORDER — INSULIN GLARGINE-YFGN 100 UNIT/ML ~~LOC~~ SOLN
20.0000 [IU] | SUBCUTANEOUS | Status: DC
  Administered 2023-05-17 – 2023-05-19 (×3): 20 [IU] via SUBCUTANEOUS
  Filled 2023-05-17 (×4): qty 0.2

## 2023-05-17 MED ORDER — ACETAMINOPHEN 325 MG PO TABS
650.0000 mg | ORAL_TABLET | Freq: Four times a day (QID) | ORAL | Status: DC | PRN
  Administered 2023-05-18 (×2): 650 mg via ORAL
  Filled 2023-05-17 (×2): qty 2

## 2023-05-17 MED ORDER — DEXTROSE 50 % IV SOLN
0.0000 mL | INTRAVENOUS | Status: DC | PRN
  Administered 2023-05-19: 25 mL via INTRAVENOUS
  Filled 2023-05-17: qty 50

## 2023-05-17 MED ORDER — ONDANSETRON HCL 4 MG/2ML IJ SOLN
INTRAMUSCULAR | Status: AC
  Filled 2023-05-17: qty 2

## 2023-05-17 MED ORDER — INSULIN ASPART 100 UNIT/ML IJ SOLN
3.0000 [IU] | Freq: Three times a day (TID) | INTRAMUSCULAR | Status: DC
  Administered 2023-05-18 (×2): 3 [IU] via SUBCUTANEOUS

## 2023-05-17 MED ORDER — POTASSIUM CHLORIDE 10 MEQ/100ML IV SOLN
10.0000 meq | INTRAVENOUS | Status: AC
  Administered 2023-05-17 (×2): 10 meq via INTRAVENOUS
  Filled 2023-05-17 (×2): qty 100

## 2023-05-17 MED ORDER — ENOXAPARIN SODIUM 40 MG/0.4ML IJ SOSY
40.0000 mg | PREFILLED_SYRINGE | INTRAMUSCULAR | Status: DC
  Administered 2023-05-17 – 2023-05-19 (×3): 40 mg via SUBCUTANEOUS
  Filled 2023-05-17 (×3): qty 0.4

## 2023-05-17 MED ORDER — ONDANSETRON HCL 4 MG/2ML IJ SOLN
4.0000 mg | Freq: Once | INTRAMUSCULAR | Status: AC
  Administered 2023-05-17: 4 mg via INTRAVENOUS

## 2023-05-17 MED ORDER — INSULIN ASPART 100 UNIT/ML IJ SOLN
0.0000 [IU] | Freq: Three times a day (TID) | INTRAMUSCULAR | Status: DC
  Administered 2023-05-17: 2 [IU] via SUBCUTANEOUS
  Administered 2023-05-18: 3 [IU] via SUBCUTANEOUS
  Administered 2023-05-18: 2 [IU] via SUBCUTANEOUS
  Administered 2023-05-18: 3 [IU] via SUBCUTANEOUS
  Administered 2023-05-18 – 2023-05-19 (×3): 2 [IU] via SUBCUTANEOUS
  Administered 2023-05-20: 1 [IU] via SUBCUTANEOUS

## 2023-05-17 MED ORDER — LACTATED RINGERS IV BOLUS
20.0000 mL/kg | Freq: Once | INTRAVENOUS | Status: AC
  Administered 2023-05-17: 1580 mL via INTRAVENOUS

## 2023-05-17 MED ORDER — PROCHLORPERAZINE EDISYLATE 10 MG/2ML IJ SOLN
10.0000 mg | Freq: Once | INTRAMUSCULAR | Status: AC
  Administered 2023-05-17: 10 mg via INTRAVENOUS
  Filled 2023-05-17: qty 2

## 2023-05-17 MED ORDER — ORAL CARE MOUTH RINSE: 15.0000 mL | OROMUCOSAL | Status: DC | PRN

## 2023-05-17 MED ORDER — DIPHENHYDRAMINE HCL 50 MG/ML IJ SOLN
25.0000 mg | Freq: Once | INTRAMUSCULAR | Status: AC
  Administered 2023-05-17: 25 mg via INTRAVENOUS
  Filled 2023-05-17: qty 1

## 2023-05-17 MED ORDER — ONDANSETRON HCL 4 MG/2ML IJ SOLN
4.0000 mg | Freq: Four times a day (QID) | INTRAMUSCULAR | Status: DC | PRN
  Administered 2023-05-17 – 2023-05-18 (×3): 4 mg via INTRAVENOUS
  Filled 2023-05-17 (×3): qty 2

## 2023-05-17 MED ORDER — METOPROLOL TARTRATE 5 MG/5ML IV SOLN
5.0000 mg | Freq: Once | INTRAVENOUS | Status: AC
  Administered 2023-05-17: 5 mg via INTRAVENOUS
  Filled 2023-05-17: qty 5

## 2023-05-17 MED ORDER — INSULIN ASPART 100 UNIT/ML IJ SOLN: 0.0000 [IU] | Freq: Three times a day (TID) | INTRAMUSCULAR | Status: DC

## 2023-05-17 MED ORDER — ONDANSETRON HCL 4 MG/2ML IJ SOLN
4.0000 mg | Freq: Once | INTRAMUSCULAR | Status: AC
  Administered 2023-05-17: 4 mg via INTRAVENOUS
  Filled 2023-05-17: qty 2

## 2023-05-17 MED ORDER — INSULIN REGULAR(HUMAN) IN NACL 100-0.9 UT/100ML-% IV SOLN
INTRAVENOUS | Status: AC
  Filled 2023-05-17: qty 100

## 2023-05-17 MED ORDER — SODIUM CHLORIDE 0.9% FLUSH
3.0000 mL | Freq: Two times a day (BID) | INTRAVENOUS | Status: DC
  Administered 2023-05-17 – 2023-05-20 (×3): 3 mL via INTRAVENOUS

## 2023-05-17 MED ORDER — METOPROLOL TARTRATE 12.5 MG HALF TABLET
12.5000 mg | ORAL_TABLET | Freq: Two times a day (BID) | ORAL | Status: DC
  Administered 2023-05-17 – 2023-05-19 (×4): 12.5 mg via ORAL
  Filled 2023-05-17 (×6): qty 1

## 2023-05-17 NOTE — ED Provider Notes (Signed)
[place] EMERGENCY DEPARTMENT AT [facility] Provider Note   CSN: [id number] Arrival date & time: 05/17/23  1015     History  Chief Complaint  Patient presents with   Emesis   Nausea   Hematemesis   Hyperglycemia    Zachary Vazquez is a 37 y.o. male with past medical history significant for diabetes, who has been seen recently for DKA who presents after just being seen in the emergency department yesterday with ongoing nausea, vomiting.  Patient has had several days of nausea and vomiting at this time, he received fluids, nausea medication in the ED yesterday, but reports that he continues to be unable to tolerate p.o., concern for DKA.  Concern was raised at his last ED visit for possible cannabinoid hyperemesis syndrome, patient denies any marijuana use since yesterday.  He reports that there is some blood in his vomit.   Emesis Hyperglycemia Associated symptoms: nausea and vomiting        Home Medications Prior to Admission medications   Medication Sig Start Date End Date Taking? Authorizing Provider  acetaminophen (TYLENOL) 325 MG tablet Take 2 tablets (650 mg total) by mouth every 6 (six) hours as needed for mild pain (or Fever >/= 101). 04/08/23   Vonna Drafts, MD  Continuous Blood Gluc Receiver (FREESTYLE LIBRE 2 READER) DEVI 1 application. by Does not apply route every 14 (fourteen) days. Patient not taking: Reported on 04/28/2023 01/10/22   Barbette Merino, NP  Continuous Blood Gluc Sensor (FREESTYLE LIBRE 2 SENSOR) MISC Use to check glucose Patient not taking: Reported on 04/28/2023 08/21/22   Ivonne Andrew, NP  docusate sodium (COLACE) 100 MG capsule Take 1 capsule (100 mg total) by mouth 2 (two) times daily. 04/14/23   Ivonne Andrew, NP  insulin aspart (NOVOLOG) 100 UNIT/ML injection Use as directed per sliding scale Patient taking differently: Inject into the skin 3 (three) times daily with meals. Sliding scale for units 07/15/22   Ivonne Andrew,  NP  insulin glargine (LANTUS) 100 UNIT/ML injection Inject 0.28 mLs (28 Units total) into the skin at bedtime. 04/14/23   Ivonne Andrew, NP  metoCLOPramide (REGLAN) 10 MG tablet Take 1 tablet (10 mg total) by mouth every 6 (six) hours. 05/16/23   Roxy Horseman, PA-C  ondansetron (ZOFRAN-ODT) 4 MG disintegrating tablet Take 1 tablet (4 mg total) by mouth every 8 (eight) hours as needed for nausea or vomiting. 04/17/23   Ivonne Andrew, NP  pantoprazole (PROTONIX) 40 MG tablet Take 1 tablet (40 mg total) by mouth 2 (two) times daily before a meal for 15 days. Patient not taking: Reported on 05/17/2023 04/25/23 05/17/23  Glade Lloyd, MD  polyethylene glycol powder (GLYCOLAX/MIRALAX) 17 GM/SCOOP powder Mix 1capful (17 g) with 4-8 ounces with water/juice by mouth 2 (two) times daily as needed. Patient not taking: Reported on 05/16/2023 04/14/23   Ivonne Andrew, NP  Probiotic Product [facility]) CAPS Take 1 capsule by mouth daily. Patient not taking: Reported on 05/16/2023 04/14/23   Ivonne Andrew, NP      Allergies    Clindamycin/lincomycin and Bactrim [sulfamethoxazole-trimethoprim]    Review of Systems   Review of Systems  Gastrointestinal:  Positive for nausea and vomiting.  All other systems reviewed and are negative.   Physical Exam Updated Vital Signs BP (!) 141/93 (BP Location: Right Arm)   Pulse (!) 103   Temp 99.1 F (37.3 C) (Oral)   Resp 17   SpO2 99%  Physical  Exam Vitals and nursing note reviewed.  Constitutional:      General: He is not in acute distress.    Appearance: He is ill-appearing and diaphoretic.     Comments: Patient actively vomiting on my initial assessment with some blood in emesis noted, blood-streaked emesis with no gross hemoptysis  HENT:     Head: Normocephalic and atraumatic.     Mouth/Throat:     Mouth: Mucous membranes are dry.  Eyes:     General:        Right eye: No discharge.        Left eye: No discharge.  Cardiovascular:     Rate and  Rhythm: Regular rhythm. Tachycardia present.     Heart sounds: No murmur heard.    No friction rub. No gallop.  Pulmonary:     Effort: Pulmonary effort is normal.     Breath sounds: Normal breath sounds.  Abdominal:     General: Bowel sounds are normal.     Palpations: Abdomen is soft.     Comments: Mild tenderness to palpation of the abdomen  Skin:    General: Skin is warm.     Capillary Refill: Capillary refill takes less than 2 seconds.     Comments: No subcutaneous emphysema on palpation of the neck and upper chest  Neurological:     Mental Status: He is alert and oriented to person, place, and time.  Psychiatric:        Mood and Affect: Mood normal.        Behavior: Behavior normal.     ED Results / Procedures / Treatments   Labs (all labs ordered are listed, but only abnormal results are displayed) Labs Reviewed  BETA-HYDROXYBUTYRIC ACID - Abnormal; Notable for the following components:      Result Value   Beta-Hydroxybutyric Acid 3.56 (*)    All other components within normal limits  COMPREHENSIVE METABOLIC PANEL - Abnormal; Notable for the following components:   Sodium 130 (*)    Chloride 89 (*)    CO2 21 (*)    Glucose, Bld 313 (*)    BUN 25 (*)    Creatinine, Ser 1.97 (*)    GFR, Estimated 44 (*)    Anion gap 20 (*)    All other components within normal limits  CBC - Abnormal; Notable for the following components:   WBC 15.7 (*)    All other components within normal limits  CBG MONITORING, ED - Abnormal; Notable for the following components:   Glucose-Capillary 291 (*)    All other components within normal limits  I-STAT VENOUS BLOOD GAS, ED - Abnormal; Notable for the following components:   pH, Ven 7.677 (*)    pCO2, Ven 23.7 (*)    pO2, Ven 20 (*)    Acid-Base Excess 9.0 (*)    Sodium 132 (*)    Calcium, Ion 1.08 (*)    Hemoglobin 17.7 (*)    All other components within normal limits  I-STAT CHEM 8, ED - Abnormal; Notable for the following  components:   Sodium 133 (*)    Chloride 92 (*)    BUN 27 (*)    Creatinine, Ser 1.90 (*)    Glucose, Bld 316 (*)    Calcium, Ion 1.00 (*)    Hemoglobin 17.7 (*)    All other components within normal limits  CBG MONITORING, ED - Abnormal; Notable for the following components:   Glucose-Capillary 270 (*)    All other  components within normal limits  CBG MONITORING, ED - Abnormal; Notable for the following components:   Glucose-Capillary 203 (*)    All other components within normal limits  I-STAT VENOUS BLOOD GAS, ED - Abnormal; Notable for the following components:   pH, Ven 7.601 (*)    pCO2, Ven 28.8 (*)    pO2, Ven 25 (*)    Bicarbonate 28.3 (*)    Acid-Base Excess 8.0 (*)    Sodium 132 (*)    Calcium, Ion 1.09 (*)    Hemoglobin 17.7 (*)    All other components within normal limits  CBG MONITORING, ED - Abnormal; Notable for the following components:   Glucose-Capillary 191 (*)    All other components within normal limits  CBG MONITORING, ED - Abnormal; Notable for the following components:   Glucose-Capillary 199 (*)    All other components within normal limits  MAGNESIUM  BASIC METABOLIC PANEL  BASIC METABOLIC PANEL  BASIC METABOLIC PANEL  URINALYSIS, ROUTINE W REFLEX MICROSCOPIC  RAPID URINE DRUG SCREEN, HOSP PERFORMED    EKG EKG Interpretation Date/Time:  Saturday May 17 2023 10:22:37 EDT Ventricular Rate:  129 PR Interval:  132 QRS Duration:  88 QT Interval:  306 QTC Calculation: 449 R Axis:   -37  Text Interpretation: Sinus tachycardia LAE, consider biatrial enlargement Left axis deviation Confirmed by Vivi Barrack [phone]) on 05/17/2023 11:26:15 AM  Radiology DG Chest Portable 1 View  Result Date: 05/17/2023 CLINICAL DATA:  Hematemesis. EXAM: PORTABLE CHEST 1 VIEW COMPARISON:  04/24/2023 FINDINGS: The lungs are clear without focal pneumonia, edema, pneumothorax or pleural effusion. The cardiopericardial silhouette is within normal limits for size. No  acute bony abnormality. Telemetry leads overlie the chest. IMPRESSION: No active disease. Electronically Signed   By: Kennith Center M.D.   On: 05/17/2023 11:27    Procedures Ultrasound ED Peripheral IV (Provider)  Date/Time: 05/17/2023 11:07 AM  Performed by: Olene Floss, PA-C Authorized by: Olene Floss, PA-C   Procedure details:    Indications: multiple failed IV attempts     Skin Prep: chlorhexidine gluconate     Location:  Right AC   Angiocath:  18 G   Bedside Ultrasound Guided: Yes     Images: not archived     Patient tolerated procedure without complications: Yes     Dressing applied: Yes   .Critical Care  Performed by: Olene Floss, PA-C Authorized by: Olene Floss, PA-C   Critical care provider statement:    Critical care time (minutes):  30   Critical care was necessary to treat or prevent imminent or life-threatening deterioration of the following conditions:  Metabolic crisis (DKA)   Critical care was time spent personally by me on the following activities:  Development of treatment plan with patient or surrogate, discussions with consultants, evaluation of patient's response to treatment, examination of patient, ordering and review of laboratory studies, ordering and review of radiographic studies, ordering and performing treatments and interventions, pulse oximetry, re-evaluation of patient's condition and review of old charts   Care discussed with: admitting provider       Medications Ordered in ED Medications  insulin regular, human (MYXREDLIN) 100 units/ 100 mL infusion (3.4 Units/hr Intravenous Rate/Dose Change 05/17/23 1421)  lactated ringers infusion ( Intravenous New Bag/Given 05/17/23 1322)  dextrose 5 % in lactated ringers infusion ( Intravenous New Bag/Given 05/17/23 1328)  dextrose 50 % solution 0-50 mL (has no administration in time range)  enoxaparin (LOVENOX) injection 40 mg (has  no administration in time range)  sodium  chloride flush (NS) 0.9 % injection 3 mL (has no administration in time range)  acetaminophen (TYLENOL) tablet 650 mg (has no administration in time range)    Or  acetaminophen (TYLENOL) suppository 650 mg (has no administration in time range)  promethazine (PHENERGAN) 25 mg in sodium chloride 0.9 % 50 mL IVPB (has no administration in time range)  albuterol (PROVENTIL) (2.5 MG/3ML) 0.083% nebulizer solution 2.5 mg (has no administration in time range)  ondansetron (ZOFRAN) injection 4 mg (4 mg Intravenous Given 05/17/23 1119)  sodium chloride 0.9 % bolus 1,000 mL (0 mLs Intravenous Stopped 05/17/23 1157)  prochlorperazine (COMPAZINE) injection 10 mg (10 mg Intravenous Given 05/17/23 1126)  diphenhydrAMINE (BENADRYL) injection 25 mg (25 mg Intravenous Given 05/17/23 1124)  lactated ringers bolus 1,580 mL (0 mLs Intravenous Stopped 05/17/23 1322)  potassium chloride 10 mEq in 100 mL IVPB (0 mEq Intravenous Stopped 05/17/23 1442)    ED Course/ Medical Decision Making/ A&P Clinical Course as of 05/17/23 1535  Sat May 17, 2023  1409 Smith to admit [CP]    Clinical Course User Index [CP] Olene Floss, PA-C                             Medical Decision Making Amount and/or Complexity of Data Reviewed Labs: ordered. Radiology: ordered.  Risk Prescription drug management.   This patient is a 37 y.o. male who presents to the ED for concern of nausea, vomiting, concern for developing DKA, this involves an extensive number of treatment options, and is a complaint that carries with it a high risk of complications and morbidity. The emergent differential diagnosis prior to evaluation includes, but is not limited to, Mallory-Weiss, Boerhaave's, gastroenteritis, marijuana hyperemesis, cyclic vomiting of other causes, diabetes, DKA, HHS, versus other. This is not an exhaustive differential.   Past Medical History / Co-morbidities / Social History: Type 1 diabetes, previous admission for  metabolic encephalopathy, tobacco use disorder  Additional history: Chart reviewed. Pertinent results include: Extensively reviewed lab work, imaging from recent hospital visits, notably seen yesterday for for similar symptoms  Physical Exam: Physical exam performed. The pertinent findings include: He is ill-appearing and diaphoretic.     Comments: Patient actively vomiting on my initial assessment with some blood in emesis noted, blood-streaked emesis with no gross hemoptysis   Mild tenderness to palpation of the abdomen   No subcutaneous emphysema on palpation of the neck and upper chest   Persistent tachycardia since arrival  Lab Tests: I ordered, and personally interpreted labs.  The pertinent results include: Initial CMP with pseudohyponatremia, sodium 130 in context of glucose 313.  He does have a mild bicarb deficit CO2 21.  He has an AKI with creatinine 1.97 from baseline around 1.3.  He has an elevated anion gap at 20.  His beta hydroxybutyric acid is elevated at 3.56.  Initial VBG with a alkalotic pH, without significant bicarb deficit, suspicious for an overlying metabolic alkalosis secondary to his severe vomiting.  CBC with leukocytosis of 15.7 likely secondary to stress, frequent vomiting.   Imaging Studies: I ordered imaging studies including plain film chest xray. I independently visualized and interpreted imaging which showed no subcutaneous free air, no other acute intrathoracic abnormality. I agree with the radiologist interpretation.   Cardiac Monitoring:  The patient was maintained on a cardiac monitor.  My attending physician Dr. Jearld Fenton viewed and interpreted the cardiac monitored which  showed an underlying rhythm of: sinus tachycardia. I agree with this interpretation.   Medications: I ordered medication including insulin, fluids for concern of underlying DKA, compazine, benadryl, zofran for nausea, vomiting. Reevaluation of the patient after these medicines showed that  the patient improved. I have reviewed the patients home medicines and have made adjustments as needed.  Consultations Obtained: I requested consultation with the hospitalist, spoke with Dr. Katrinka Blazing,  and discussed lab and imaging findings as well as pertinent plan - they recommend: Admission at this time   Disposition: After consideration of the diagnostic results and the patients response to treatment, I feel that patient would benefit from admission for nausea, vomiting, dehydration, AKI, and concern for DKA with overlying metabolic alkalosis secondary to significant vomiting.   I discussed this case with my attending physician Dr. Jearld Fenton who cosigned this note including patient's presenting symptoms, physical exam, and planned diagnostics and interventions. Attending physician stated agreement with plan or made changes to plan which were implemented.    Final Clinical Impression(s) / ED Diagnoses Final diagnoses:  AKI (acute kidney injury) (HCC)  Nausea and vomiting, unspecified vomiting type  Hyperglycemia    Rx / DC Orders ED Discharge Orders     None         Olene Floss, PA-C 05/17/23 1535    Loetta Rough, MD 05/18/23 1322

## 2023-05-17 NOTE — ED Notes (Signed)
CBG 191 

## 2023-05-17 NOTE — Plan of Care (Signed)
  Problem: Education: Goal: Ability to describe self-care measures that may prevent or decrease complications (Diabetes Survival Skills Education) will improve Outcome: Progressing   Problem: Health Behavior/Discharge Planning: Goal: Ability to manage health-related needs will improve Outcome: Progressing   Problem: Metabolic: Goal: Ability to maintain appropriate glucose levels will improve Outcome: Progressing

## 2023-05-17 NOTE — Progress Notes (Signed)
Patient arrived to unit actively vomiting. HR & BP elevated. Notified MD, and IV Phenergan started. Also clarified with MD about pt needing HS insulin orders. Additionally pt requesting Zofran for nausea. Orders received for insulin, Metoprolol, and Zofran.   Metoprolol effective in lowering HR & BP. Will continue to follow plan of care and monitor pt for further nausea & vomiting.

## 2023-05-17 NOTE — ED Triage Notes (Signed)
Patient BIB EMS home with c/o n/v and hyperglycemia x3 days CBG 355. Vomiting blood, dark brown in color. Denies ABD pain/fever/chills. HR 150's, 160/100. Zofran 4mg  IM

## 2023-05-17 NOTE — H&P (Signed)
History and Physical    Patient: Zachary Vazquez ZOX:[id number] DOB: [birth year] DOA: 05/17/2023 DOS: the patient was seen and examined on 05/17/2023 PCP: Ivonne Andrew, NP  Patient coming from: Home  Chief Complaint:  Chief Complaint  Patient presents with   Emesis   Nausea   Hematemesis   Hyperglycemia   HPI: Zachary Vazquez is a 37 y.o. male with medical history significant of diabetes mellitus type 1 with frequent admissions for DKA with nausea and vomiting who presents with complaints of continued nausea and vomiting. He had been seen emergency department yesterday with complaints of nausea and vomiting.  Patient had been given 2 L and normal saline IV fluids, Reglan, and Phenergan prior to being discharged home.  At home patient reported that he had been using Reglan as well as Zofran without any improvement in symptoms.  He presented back to the hospital due to being unable to keep any significant food or liquids down.  He also reported seeing blood present in emesis.  Patient does not drink alcohol, but does intermittently using marijuana.  In the emergency department patient was noted to be afebrile with tachycardia and tachypnea.  Labs significant for WBC 15.7, sodium 130, CO2 21, BUN 25, creatinine 1.97, and anion gap 20.  Chest x-ray showed no acute abnormality or concern for free air underneath the diaphragm.   Patient was given 2.58 L of IV fluids and started on insulin drip per protocol.  He had also received Zofran, Compazine, Benadryl, and 20 mEq potassium chloride IV.  Review of Systems: As mentioned in the history of present illness. All other systems reviewed and are negative. Past Medical History:  Diagnosis Date   History of delayed wound healing 10/2019   Rash and nonspecific skin eruption 10/2019   Type 1 diabetes mellitus (HCC)    Vitamin D deficiency 09/2020   Wound of lower extremity, left, sequela 10/2019   Past Surgical History:  Procedure  Laterality Date   TONSILLECTOMY     Social History:  reports that he has been smoking cigarettes. He has a 15 pack-year smoking history. He has never used smokeless tobacco. He reports current alcohol use. He reports that he does not currently use drugs after having used the following drugs: Marijuana.  Allergies  Allergen Reactions   Clindamycin/Lincomycin Anaphylaxis   Bactrim [Sulfamethoxazole-Trimethoprim] Swelling and Other (See Comments)    Eyes swell and bumps appear on/near the eyes     Family History  Problem Relation Age of Onset   Hypertension Mother    Diabetes Mellitus I Brother     Prior to Admission medications   Medication Sig Start Date End Date Taking? Authorizing Provider  acetaminophen (TYLENOL) 325 MG tablet Take 2 tablets (650 mg total) by mouth every 6 (six) hours as needed for mild pain (or Fever >/= 101). 04/08/23   Vonna Drafts, MD  Continuous Blood Gluc Receiver (FREESTYLE LIBRE 2 READER) DEVI 1 application. by Does not apply route every 14 (fourteen) days. Patient not taking: Reported on 04/28/2023 01/10/22   Barbette Merino, NP  Continuous Blood Gluc Sensor (FREESTYLE LIBRE 2 SENSOR) MISC Use to check glucose Patient not taking: Reported on 04/28/2023 08/21/22   Ivonne Andrew, NP  docusate sodium (COLACE) 100 MG capsule Take 1 capsule (100 mg total) by mouth 2 (two) times daily. 04/14/23   Ivonne Andrew, NP  insulin aspart (NOVOLOG) 100 UNIT/ML injection Use as directed per sliding scale Patient taking differently: Inject into the  skin 3 (three) times daily with meals. Sliding scale for units 07/15/22   Ivonne Andrew, NP  insulin glargine (LANTUS) 100 UNIT/ML injection Inject 0.28 mLs (28 Units total) into the skin at bedtime. 04/14/23   Ivonne Andrew, NP  metoCLOPramide (REGLAN) 10 MG tablet Take 1 tablet (10 mg total) by mouth every 6 (six) hours. 05/16/23   Roxy Horseman, PA-C  ondansetron (ZOFRAN-ODT) 4 MG disintegrating tablet Take 1 tablet (4 mg  total) by mouth every 8 (eight) hours as needed for nausea or vomiting. 04/17/23   Ivonne Andrew, NP  pantoprazole (PROTONIX) 40 MG tablet Take 1 tablet (40 mg total) by mouth 2 (two) times daily before a meal for 15 days. Patient not taking: Reported on 05/16/2023 04/25/23 05/10/23  Glade Lloyd, MD  polyethylene glycol powder (GLYCOLAX/MIRALAX) 17 GM/SCOOP powder Mix 1capful (17 g) with 4-8 ounces with water/juice by mouth 2 (two) times daily as needed. Patient not taking: Reported on 05/16/2023 04/14/23   Ivonne Andrew, NP  Probiotic Product [facility]) CAPS Take 1 capsule by mouth daily. Patient not taking: Reported on 05/16/2023 04/14/23   Ivonne Andrew, NP    Physical Exam: Vitals:   05/17/23 1200 05/17/23 1215 05/17/23 1230 05/17/23 1245  BP: 131/88 (!) 132/91 (!) 137/93 (!) 131/94  Pulse: (!) 104 (!) 105 (!) 102 (!) 107  Resp: 17 18 16 18   Temp:      TempSrc:      SpO2: 97% 98% 98% 99%    Constitutional: Young male who appears to be acutely ill Eyes: PERRL, lids and conjunctivae normal ENMT: Mucous membranes are dry.  Fair dentition. Neck: normal, supple  Respiratory: clear to auscultation bilaterally, no wheezing, no crackles. Normal respiratory effort. No accessory muscle use.  Cardiovascular: Tachycardic.  No extremity edema.  Abdomen: No significant tenderness to palpation. Musculoskeletal: no clubbing / cyanosis. No joint deformity upper and lower extremities. Good ROM, no contractures. Normal muscle tone.  Skin: no rashes, lesions, ulcers. No induration.  Poor skin turgor. Neurologic: CN 2-12 grossly intact.  Strength 5/5 in all 4.  Psychiatric: Normal judgment and insight. Alert and oriented x 3. Normal mood.   Data Reviewed:  EKG revealed sinus tachycardia 129 bpm with left axis deviation.  Reviewed labs, imaging, and pertinent records as documented in this note.  Assessment and Plan:   Intractable nausea and vomiting DKA, type I Acute on admissions patient  was noted to have glucose elevated at 313 with CO2 21, anion gap of 20, and initial beta hydroxybutyrate 3.56.  Venous pH however was elevated at 7.677.  The pseudohyponatremia corrected 235 when adjusted for hyperglycemia.  Suspected patient had early signs of DKA which seemed to improve after initial IV fluid boluses.  Unclear if patient's nausea vomiting symptoms are secondary to DKA, gastroparesis, or possibly related to cannabinoid hyperemesis. -Admit to a progressive bed -Monitor intake and output -Hyperglycemia protocol order set utilized -Follow-up urinalysis -Continue insulin drip per protocol -Serial BMPs monitoring for closure of anion gap -Transition to subcu insulin once medically appropriate  Acute kidney injury Creatinine on admission elevated up to 1.97 with BUN 25.  Creatinine was noted to be 1.35 yesterday. -Check urinalysis -Continue IV fluids -Recheck kidney function in a.m.  Marijuana use Patient reports intermittent use of marijuana. -Check UDS  DVT prophylaxis: Lovenox Advance Care Planning:   Code Status: Full Code   Consults: None  Family Communication: None requested  Severity of Illness: The appropriate patient status for this  patient is OBSERVATION. Observation status is judged to be reasonable and necessary in order to provide the required intensity of service to ensure the patient's safety. The patient's presenting symptoms, physical exam findings, and initial radiographic and laboratory data in the context of their medical condition is felt to place them at decreased risk for further clinical deterioration. Furthermore, it is anticipated that the patient will be medically stable for discharge from the hospital within 2 midnights of admission.   Author: Clydie Braun, MD 05/17/2023 1:28 PM  For on call review [email or web address].

## 2023-05-18 ENCOUNTER — Encounter (HOSPITAL_COMMUNITY): Payer: Self-pay | Admitting: Internal Medicine

## 2023-05-18 DIAGNOSIS — R112 Nausea with vomiting, unspecified: Secondary | ICD-10-CM | POA: Diagnosis not present

## 2023-05-18 LAB — CBC
HCT: 40.1 % (ref 39.0–52.0)
Hemoglobin: 13.6 g/dL (ref 13.0–17.0)
MCH: 31.6 pg (ref 26.0–34.0)
MCHC: 33.9 g/dL (ref 30.0–36.0)
MCV: 93.3 fL (ref 80.0–100.0)
Platelets: 300 10*3/uL (ref 150–400)
RBC: 4.3 MIL/uL (ref 4.22–5.81)
RDW: 12.5 % (ref 11.5–15.5)
WBC: 18.3 10*3/uL — ABNORMAL HIGH (ref 4.0–10.5)
nRBC: 0 % (ref 0.0–0.2)

## 2023-05-18 LAB — BASIC METABOLIC PANEL
Anion gap: 15 (ref 5–15)
BUN: 20 mg/dL (ref 6–20)
CO2: 28 mmol/L (ref 22–32)
Calcium: 9.4 mg/dL (ref 8.9–10.3)
Chloride: 95 mmol/L — ABNORMAL LOW (ref 98–111)
Creatinine, Ser: 1.49 mg/dL — ABNORMAL HIGH (ref 0.61–1.24)
GFR, Estimated: >60 mL/min (ref >60)
Glucose, Bld: 148 mg/dL — ABNORMAL HIGH (ref 70–99)
Potassium: 3.4 mmol/L — ABNORMAL LOW (ref 3.5–5.1)
Sodium: 138 mmol/L (ref 135–145)

## 2023-05-18 LAB — URINALYSIS, ROUTINE W REFLEX MICROSCOPIC
Bacteria, UA: NONE SEEN
Bilirubin Urine: NEGATIVE
Glucose, UA: 150 mg/dL — AB
Hgb urine dipstick: NEGATIVE
Ketones, ur: 20 mg/dL — AB
Leukocytes,Ua: NEGATIVE
Nitrite: NEGATIVE
Protein, ur: 100 mg/dL — AB
Specific Gravity, Urine: 1.021 (ref 1.005–1.030)
pH: 7 (ref 5.0–8.0)

## 2023-05-18 LAB — GLUCOSE, CAPILLARY
Glucose-Capillary: 201 mg/dL — ABNORMAL HIGH (ref 70–99)
Glucose-Capillary: 220 mg/dL — ABNORMAL HIGH (ref 70–99)
Glucose-Capillary: 166 mg/dL — ABNORMAL HIGH (ref 70–99)
Glucose-Capillary: 165 mg/dL — ABNORMAL HIGH (ref 70–99)

## 2023-05-18 MED ORDER — MAGNESIUM SULFATE 2 GM/50ML IV SOLN
2.0000 g | Freq: Once | INTRAVENOUS | Status: AC
  Administered 2023-05-18: 2 g via INTRAVENOUS
  Filled 2023-05-18: qty 50

## 2023-05-18 MED ORDER — LISINOPRIL 20 MG PO TABS
20.0000 mg | ORAL_TABLET | Freq: Every day | ORAL | Status: DC
  Administered 2023-05-18 – 2023-05-20 (×3): 20 mg via ORAL
  Filled 2023-05-18 (×3): qty 1

## 2023-05-18 MED ORDER — METOCLOPRAMIDE HCL 5 MG PO TABS: 5.0000 mg | ORAL_TABLET | Freq: Three times a day (TID) | ORAL | Status: DC

## 2023-05-18 MED ORDER — POTASSIUM CHLORIDE 20 MEQ PO PACK
40.0000 meq | PACK | Freq: Once | ORAL | Status: AC
  Administered 2023-05-18: 40 meq via ORAL
  Filled 2023-05-18: qty 2

## 2023-05-18 MED ORDER — HALOPERIDOL LACTATE 5 MG/ML IJ SOLN
5.0000 mg | Freq: Once | INTRAMUSCULAR | Status: AC
  Administered 2023-05-18: 5 mg via INTRAVENOUS
  Filled 2023-05-18: qty 1

## 2023-05-18 NOTE — Plan of Care (Signed)
  Problem: Clinical Measurements: Goal: Ability to maintain clinical measurements within normal limits will improve Outcome: Progressing Goal: Will remain free from infection Outcome: Progressing Goal: Diagnostic test results will improve Outcome: Progressing Goal: Respiratory complications will improve Outcome: Progressing Goal: Cardiovascular complication will be avoided Outcome: Progressing   Problem: Activity: Goal: Risk for activity intolerance will decrease Outcome: Progressing   Problem: Nutrition: Goal: Adequate nutrition will be maintained Outcome: Progressing   Problem: Coping: Goal: Level of anxiety will decrease Outcome: Progressing   Problem: Elimination: Goal: Will not experience complications related to bowel motility Outcome: Progressing Goal: Will not experience complications related to urinary retention Outcome: Progressing   Problem: Pain Managment: Goal: General experience of comfort will improve Outcome: Progressing   Problem: Safety: Goal: Ability to remain free from injury will improve Outcome: Progressing   Problem: Skin Integrity: Goal: Risk for impaired skin integrity will decrease Outcome: Progressing   Problem: Education: Goal: Knowledge of General Education information will improve Description: Including pain rating scale, medication(s)/side effects and non-pharmacologic comfort measures Outcome: Progressing   Problem: Health Behavior/Discharge Planning: Goal: Ability to manage health-related needs will improve Outcome: Progressing   Problem: Clinical Measurements: Goal: Ability to maintain clinical measurements within normal limits will improve Outcome: Progressing Goal: Will remain free from infection Outcome: Progressing Goal: Diagnostic test results will improve Outcome: Progressing Goal: Respiratory complications will improve Outcome: Progressing Goal: Cardiovascular complication will be avoided Outcome: Progressing    Problem: Activity: Goal: Risk for activity intolerance will decrease Outcome: Progressing   Problem: Nutrition: Goal: Adequate nutrition will be maintained Outcome: Progressing   Problem: Coping: Goal: Level of anxiety will decrease Outcome: Progressing   Problem: Elimination: Goal: Will not experience complications related to bowel motility Outcome: Progressing Goal: Will not experience complications related to urinary retention Outcome: Progressing   Problem: Pain Managment: Goal: General experience of comfort will improve Outcome: Progressing   Problem: Safety: Goal: Ability to remain free from injury will improve Outcome: Progressing   Problem: Skin Integrity: Goal: Risk for impaired skin integrity will decrease Outcome: Progressing   Problem: Education: Goal: Ability to describe self-care measures that may prevent or decrease complications (Diabetes Survival Skills Education) will improve Outcome: Progressing Goal: Individualized Educational Video(s) Outcome: Progressing   Problem: Coping: Goal: Ability to adjust to condition or change in health will improve Outcome: Progressing   Problem: Fluid Volume: Goal: Ability to maintain a balanced intake and output will improve Outcome: Progressing   Problem: Health Behavior/Discharge Planning: Goal: Ability to identify and utilize available resources and services will improve Outcome: Progressing Goal: Ability to manage health-related needs will improve Outcome: Progressing   Problem: Metabolic: Goal: Ability to maintain appropriate glucose levels will improve Outcome: Progressing   Problem: Nutritional: Goal: Maintenance of adequate nutrition will improve Outcome: Progressing Goal: Progress toward achieving an optimal weight will improve Outcome: Progressing   Problem: Skin Integrity: Goal: Risk for impaired skin integrity will decrease Outcome: Progressing   Problem: Tissue Perfusion: Goal: Adequacy  of tissue perfusion will improve Outcome: Progressing

## 2023-05-18 NOTE — Plan of Care (Signed)
  Problem: Education: Goal: Ability to describe self-care measures that may prevent or decrease complications (Diabetes Survival Skills Education) will improve Outcome: Progressing Goal: Individualized Educational Video(s) Outcome: Progressing   Problem: Cardiac: Goal: Ability to maintain an adequate cardiac output will improve Outcome: Progressing   Problem: Health Behavior/Discharge Planning: Goal: Ability to identify and utilize available resources and services will improve Outcome: Progressing Goal: Ability to manage health-related needs will improve Outcome: Progressing   Problem: Fluid Volume: Goal: Ability to achieve a balanced intake and output will improve Outcome: Progressing   Problem: Metabolic: Goal: Ability to maintain appropriate glucose levels will improve Outcome: Progressing   Problem: Nutritional: Goal: Maintenance of adequate nutrition will improve Outcome: Progressing Goal: Maintenance of adequate weight for body size and type will improve Outcome: Progressing   Problem: Respiratory: Goal: Will regain and/or maintain adequate ventilation Outcome: Progressing   Problem: Urinary Elimination: Goal: Ability to achieve and maintain adequate renal perfusion and functioning will improve Outcome: Progressing   Problem: Education: Goal: Knowledge of General Education information will improve Description: Including pain rating scale, medication(s)/side effects and non-pharmacologic comfort measures Outcome: Progressing   Problem: Health Behavior/Discharge Planning: Goal: Ability to manage health-related needs will improve Outcome: Progressing   Problem: Clinical Measurements: Goal: Ability to maintain clinical measurements within normal limits will improve Outcome: Progressing Goal: Will remain free from infection Outcome: Progressing Goal: Diagnostic test results will improve Outcome: Progressing Goal: Respiratory complications will improve Outcome:  Progressing Goal: Cardiovascular complication will be avoided Outcome: Progressing   Problem: Activity: Goal: Risk for activity intolerance will decrease Outcome: Progressing   Problem: Nutrition: Goal: Adequate nutrition will be maintained Outcome: Progressing   Problem: Coping: Goal: Level of anxiety will decrease Outcome: Progressing   Problem: Elimination: Goal: Will not experience complications related to bowel motility Outcome: Progressing Goal: Will not experience complications related to urinary retention Outcome: Progressing   Problem: Pain Managment: Goal: General experience of comfort will improve Outcome: Progressing   Problem: Safety: Goal: Ability to remain free from injury will improve Outcome: Progressing   Problem: Skin Integrity: Goal: Risk for impaired skin integrity will decrease Outcome: Progressing   

## 2023-05-18 NOTE — Progress Notes (Signed)
Patient continues with persistent nausea/vomiting despite receiving IV Phenergan and Zofran at 0400. Notified MD received orders for Haldol.

## 2023-05-18 NOTE — Progress Notes (Signed)
PROGRESS NOTE    Zachary Vazquez  KGM:[id number] DOB: 08-06-1986 DOA: 05/17/2023 PCP: Ivonne Andrew, NP  Outpatient Specialists:     Brief Narrative:  Patient is a 37 year old male with past medical history significant for type 1 diabetes mellitus, recurrent admissions for diabetes ketoacidosis, nausea and vomiting.  Patient was admitted with another episode of nausea, vomiting and elevated blood sugar.  05/18/2023: Patient seen.  Patient continues to report nausea and vomiting.  Patient uses cannabis.  Blood pressure has been noted to be elevated.   Assessment & Plan:   Principal Problem:   Intractable nausea and vomiting Active Problems:   AKI (acute kidney injury) (HCC)   DKA, type 1 (HCC)   Marijuana use   Intractable nausea and vomiting DKA, type I Acute on admissions patient was noted to have glucose elevated at 313 with CO2 21, anion gap of 20, and initial beta hydroxybutyrate 3.56.  Venous pH however was elevated at 7.677.  The pseudohyponatremia corrected 235 when adjusted for hyperglycemia.  Suspected patient had early signs of DKA which seemed to improve after initial IV fluid boluses.  Unclear if patient's nausea vomiting symptoms are secondary to DKA, gastroparesis, or possibly related to cannabinoid hyperemesis. -Admit to a progressive bed -Monitor intake and output -Hyperglycemia protocol order set utilized -Follow-up urinalysis -Continue insulin drip per protocol -Serial BMPs monitoring for closure of anion gap -Transition to subcu insulin once medically appropriate 05/18/2023: Continue subcutaneous Semglee 20 units once daily, subcutaneous NovoLog 3 units with meals and sliding scale insulin coverage.   Acute kidney injury Creatinine on admission elevated up to 1.97 with BUN 25.  Creatinine was noted to be 1.35 yesterday. -Check urinalysis -Continue IV fluids -Recheck kidney function in a.m. 05/18/2023: Resolving.  Likely prerenal.  Serum creatinine is  1.49.   Marijuana use Patient reports intermittent use of marijuana. -Check UDS 05/18/2023: Counseled to quit marijuana use.  Hypokalemia: -Potassium is 3.4. -Continue to monitor and replete. -Last magnesium level was 1.7.  Rule out diabetes gastroparesis: -Discontinue Zofran and Phenergan. -Use Reglan 5 Mg p.o. 3 times daily. -Last EKG revealed QTc interval of 449 ms.     DVT prophylaxis: Lovenox. Code Status: Full code. Family Communication:  Disposition Plan: Home eventually.   Consultants:  None.  Procedures:  None.  Antimicrobials:  None.   Subjective: Patient continues to report nausea.  Objective: Vitals:   05/18/23 0058 05/18/23 0425 05/18/23 0745 05/18/23 0920  BP:  (!) 164/108 (!) 159/104 (!) 172/113  Pulse:   86   Resp:  18 16   Temp: 100 F (37.8 C) 99.9 F (37.7 C) 100.1 F (37.8 C) 100 F (37.8 C)  TempSrc: Oral Oral Oral Oral  SpO2:  97% 97% 99%  Weight:  74.7 kg    Height:        Intake/Output Summary (Last 24 hours) at 05/18/2023 1105 Last data filed at 05/18/2023 6440 Gross per 24 hour  Intake 2973.13 ml  Output 400 ml  Net 2573.13 ml   Filed Weights   05/17/23 2125 05/18/23 0425  Weight: 74.7 kg 74.7 kg    Examination: General exam: Appears calm and comfortable  Respiratory system: Clear to auscultation.  Cardiovascular system: S1 & S2 heard Gastrointestinal system: Abdomen is soft and nontender.  Central nervous system: Awake and alert.  Patient moves all extremities.   Extremities: No leg edema.  Data Reviewed: I have personally reviewed following labs and imaging studies  CBC: Recent Labs  Lab 05/15/23 2354  05/15/23 2356 05/17/23 1114 05/17/23 1115 05/17/23 1145 05/17/23 1329 05/18/23 0034  WBC 10.7*  --   --   --  15.7*  --  18.3*  HGB 16.3   < > 17.7* 17.7* 14.0 17.7* 13.6  HCT 48.6   < > 52.0 52.0 40.7 52.0 40.1  MCV 92.2  --   --   --  90.8  --  93.3  PLT 294  --   --   --  295  --  300   < > = values  in this interval not displayed.   Basic Metabolic Panel: Recent Labs  Lab 05/15/23 2354 05/15/23 2356 05/17/23 1039 05/17/23 1114 05/17/23 1115 05/17/23 1329 05/17/23 1609 05/18/23 0034  NA 127*   < > 130* 132* 133* 132* 135 138  K 3.9   < > 3.8 4.4 4.3 4.6 3.8 3.4*  CL 92*  --  89*  --  92*  --  95* 95*  CO2 21*  --  21*  --   --   --  28 28  GLUCOSE 113*  --  313*  --  316*  --  190* 148*  BUN 14  --  25*  --  27*  --  23* 20  CREATININE 1.35*  --  1.97*  --  1.90*  --  1.69* 1.49*  CALCIUM 10.0  --  9.6  --   --   --  9.1 9.4  MG  --   --  1.7  --   --   --   --   --    < > = values in this interval not displayed.   GFR: Estimated Creatinine Clearance: 72.4 mL/min (A) (by C-G formula based on SCr of 1.49 mg/dL (H)). Liver Function Tests: Recent Labs  Lab 05/15/23 2354 05/17/23 1039  AST 19 22  ALT 17 21  ALKPHOS 58 60  BILITOT 1.6* 1.2  PROT 8.0 7.4  ALBUMIN 4.7 4.5   Recent Labs  Lab 05/15/23 2354  LIPASE 24   No results for input(s): "AMMONIA" in the last 168 hours. Coagulation Profile: No results for input(s): "INR", "PROTIME" in the last 168 hours. Cardiac Enzymes: No results for input(s): "CKTOTAL", "CKMB", "CKMBINDEX", "TROPONINI" in the last 168 hours. BNP (last 3 results) No results for input(s): "PROBNP" in the last 8760 hours. HbA1C: No results for input(s): "HGBA1C" in the last 72 hours. CBG: Recent Labs  Lab 05/17/23 1823 05/17/23 1924 05/17/23 2023 05/17/23 2130 05/18/23 0618  GLUCAP 192* 162* 150* 167* 201*   Lipid Profile: No results for input(s): "CHOL", "HDL", "LDLCALC", "TRIG", "CHOLHDL", "LDLDIRECT" in the last 72 hours. Thyroid Function Tests: No results for input(s): "TSH", "T4TOTAL", "FREET4", "T3FREE", "THYROIDAB" in the last 72 hours. Anemia Panel: No results for input(s): "VITAMINB12", "FOLATE", "FERRITIN", "TIBC", "IRON", "RETICCTPCT" in the last 72 hours. Urine analysis:    Component Value Date/Time   COLORURINE  YELLOW 05/18/2023 0045   APPEARANCEUR CLEAR 05/18/2023 0045   APPEARANCEUR Clear 10/11/2020 1442   LABSPEC 1.021 05/18/2023 0045   LABSPEC 1.015 04/17/2023 1421   PHURINE 7.0 05/18/2023 0045   GLUCOSEU 150 (A) 05/18/2023 0045   HGBUR NEGATIVE 05/18/2023 0045   BILIRUBINUR NEGATIVE 05/18/2023 0045   BILIRUBINUR negative 04/17/2023 1421   BILIRUBINUR neg 01/09/2021 1044   BILIRUBINUR Negative 10/11/2020 1442   KETONESUR 20 (A) 05/18/2023 0045   PROTEINUR 100 (A) 05/18/2023 0045   UROBILINOGEN 1.0 07/13/2021 1101   UROBILINOGEN 0.2 01/19/2018 1610  NITRITE NEGATIVE 05/18/2023 0045   LEUKOCYTESUR NEGATIVE 05/18/2023 0045   Sepsis Labs: @LABRCNTIP (procalcitonin:4,lacticidven:4)  )No results found for this or any previous visit (from the past 240 hour(s)).       Radiology Studies: DG Chest Portable 1 View  Result Date: 05/17/2023 CLINICAL DATA:  Hematemesis. EXAM: PORTABLE CHEST 1 VIEW COMPARISON:  04/24/2023 FINDINGS: The lungs are clear without focal pneumonia, edema, pneumothorax or pleural effusion. The cardiopericardial silhouette is within normal limits for size. No acute bony abnormality. Telemetry leads overlie the chest. IMPRESSION: No active disease. Electronically Signed   By: Kennith Center M.D.   On: 05/17/2023 11:27        Scheduled Meds:  enoxaparin (LOVENOX) injection  40 mg Subcutaneous Q24H   insulin aspart  0-9 Units Subcutaneous TID AC & HS   insulin aspart  3 Units Subcutaneous TID WC   insulin glargine-yfgn  20 Units Subcutaneous Q24H   lisinopril  20 mg Oral Daily   metoprolol tartrate  12.5 mg Oral BID   sodium chloride flush  3 mL Intravenous Q12H   Continuous Infusions:  lactated ringers 100 mL/hr at 05/18/23 0618   promethazine (PHENERGAN) injection (IM or IVPB) 25 mg (05/18/23 0408)     LOS: 1 day    Time spent: 35 Minutes.    Berton Mount, MD  [facility] Pager #: [phone] 7PM-7AM contact night coverage as  above

## 2023-05-19 DIAGNOSIS — R112 Nausea with vomiting, unspecified: Secondary | ICD-10-CM | POA: Diagnosis not present

## 2023-05-19 LAB — CBC WITH DIFFERENTIAL/PLATELET
Abs Immature Granulocytes: 0.05 10*3/uL (ref 0.00–0.07)
Basophils Absolute: 0.1 10*3/uL (ref 0.0–0.1)
Basophils Relative: 0 %
Eosinophils Absolute: 0.1 10*3/uL (ref 0.0–0.5)
Eosinophils Relative: 1 %
HCT: 37.6 % — ABNORMAL LOW (ref 39.0–52.0)
Hemoglobin: 12.8 g/dL — ABNORMAL LOW (ref 13.0–17.0)
Immature Granulocytes: 0 %
Lymphocytes Relative: 15 %
Lymphs Abs: 2 10*3/uL (ref 0.7–4.0)
MCH: 31.4 pg (ref 26.0–34.0)
MCHC: 34 g/dL (ref 30.0–36.0)
MCV: 92.4 fL (ref 80.0–100.0)
Monocytes Absolute: 0.9 10*3/uL (ref 0.1–1.0)
Monocytes Relative: 7 %
Neutro Abs: 10.1 10*3/uL — ABNORMAL HIGH (ref 1.7–7.7)
Neutrophils Relative %: 77 %
Platelets: 265 10*3/uL (ref 150–400)
RBC: 4.07 MIL/uL — ABNORMAL LOW (ref 4.22–5.81)
RDW: 11.9 % (ref 11.5–15.5)
WBC: 13.2 10*3/uL — ABNORMAL HIGH (ref 4.0–10.5)
nRBC: 0 % (ref 0.0–0.2)

## 2023-05-19 LAB — GLUCOSE, CAPILLARY
Glucose-Capillary: 150 mg/dL — ABNORMAL HIGH (ref 70–99)
Glucose-Capillary: 64 mg/dL — ABNORMAL LOW (ref 70–99)
Glucose-Capillary: 183 mg/dL — ABNORMAL HIGH (ref 70–99)
Glucose-Capillary: 212 mg/dL — ABNORMAL HIGH (ref 70–99)
Glucose-Capillary: 200 mg/dL — ABNORMAL HIGH (ref 70–99)

## 2023-05-19 LAB — RENAL FUNCTION PANEL
Albumin: 3.3 g/dL — ABNORMAL LOW (ref 3.5–5.0)
Anion gap: 7 (ref 5–15)
BUN: 13 mg/dL (ref 6–20)
CO2: 29 mmol/L (ref 22–32)
Calcium: 8.8 mg/dL — ABNORMAL LOW (ref 8.9–10.3)
Chloride: 100 mmol/L (ref 98–111)
Creatinine, Ser: 1.21 mg/dL (ref 0.61–1.24)
GFR, Estimated: >60 mL/min (ref >60)
Glucose, Bld: 59 mg/dL — ABNORMAL LOW (ref 70–99)
Phosphorus: 2.4 mg/dL — ABNORMAL LOW (ref 2.5–4.6)
Potassium: 3.5 mmol/L (ref 3.5–5.1)
Sodium: 136 mmol/L (ref 135–145)

## 2023-05-19 LAB — MAGNESIUM: Magnesium: 1.9 mg/dL (ref 1.7–2.4)

## 2023-05-19 MED ORDER — METOCLOPRAMIDE HCL 5 MG PO TABS
5.0000 mg | ORAL_TABLET | Freq: Three times a day (TID) | ORAL | Status: DC
  Administered 2023-05-19 (×2): 5 mg via ORAL
  Filled 2023-05-19 (×2): qty 1

## 2023-05-19 MED ORDER — METOCLOPRAMIDE HCL 5 MG/ML IJ SOLN
5.0000 mg | Freq: Four times a day (QID) | INTRAMUSCULAR | Status: AC
  Administered 2023-05-19 – 2023-05-20 (×3): 5 mg via INTRAVENOUS
  Filled 2023-05-19 (×3): qty 2

## 2023-05-19 MED ORDER — CARVEDILOL 3.125 MG PO TABS
3.1250 mg | ORAL_TABLET | Freq: Two times a day (BID) | ORAL | Status: DC
  Administered 2023-05-19 – 2023-05-20 (×2): 3.125 mg via ORAL
  Filled 2023-05-19 (×2): qty 1

## 2023-05-19 MED ORDER — POTASSIUM PHOSPHATES 15 MMOLE/5ML IV SOLN
15.0000 mmol | Freq: Once | INTRAVENOUS | Status: AC
  Administered 2023-05-19: 15 mmol via INTRAVENOUS
  Filled 2023-05-19: qty 5

## 2023-05-19 MED ORDER — ONDANSETRON HCL 4 MG/2ML IJ SOLN
4.0000 mg | Freq: Four times a day (QID) | INTRAMUSCULAR | Status: DC | PRN
  Administered 2023-05-19: 4 mg via INTRAVENOUS
  Filled 2023-05-19: qty 2

## 2023-05-19 MED ORDER — KCL IN DEXTROSE-NACL 20-5-0.9 MEQ/L-%-% IV SOLN
INTRAVENOUS | Status: DC
  Filled 2023-05-19 (×2): qty 1000

## 2023-05-19 MED ORDER — MAGNESIUM SULFATE IN D5W 1-5 GM/100ML-% IV SOLN
1.0000 g | Freq: Once | INTRAVENOUS | Status: AC
  Administered 2023-05-19: 1 g via INTRAVENOUS
  Filled 2023-05-19: qty 100

## 2023-05-19 MED ORDER — CAPSAICIN 0.075 % EX CREA
TOPICAL_CREAM | Freq: Two times a day (BID) | CUTANEOUS | Status: DC
  Filled 2023-05-19: qty 57

## 2023-05-19 NOTE — Progress Notes (Addendum)
PROGRESS NOTE    Zachary Vazquez  NWG:[id number] DOB: 1986/05/30 DOA: 05/17/2023 PCP: Ivonne Andrew, NP  Outpatient Specialists:     Brief Narrative:  Patient is a 37 year old male with past medical history significant for type 1 diabetes mellitus, recurrent admissions for diabetes ketoacidosis, cannabis use, nausea and vomiting.  Patient was admitted with another episode of nausea, vomiting and elevated blood sugar.  05/19/2023: Patient seen.  Patient has continued to have significant nausea with vomiting.  Will change Reglan to IV.  Will proceed with gastric emptying study.  Capsicin cream to the abdomen.  If gastric emptying study comes back negative, will likely treat patient for possible cannabinoid hyperemesis (patient may need haloperidol or droperidol if there is a case).  Will continue to monitor and correct abnormal electrolytes.  Will keep patient n.p.o. except for ice chips and medication for now.  Monitor blood sugar closely.  Patient has been counseled to quit cannabis.  Assessment & Plan:   Principal Problem:   Intractable nausea and vomiting Active Problems:   AKI (acute kidney injury) (HCC)   DKA, type 1 (HCC)   Marijuana use   Intractable nausea and vomiting DKA, type I -Glucose elevated at 313 with CO2 21, anion gap of 20, and initial beta hydroxybutyrate 3.56.   -Venous pH however was elevated at 7.677.   -Unclear if patient's nausea vomiting is secondary to diabetic gastroparesis, or possibly related to cannabinoid hyperemesis. -I doubt DKA. -Gastric emptying studies. -Continue IV Reglan for now. -Capsaicin to abdominal area. -Monitor blood sugar closely. -Continue with subcutaneous Semglee 20 units daily, subcutaneous NovoLog 3 units 3 times daily sliding scale insulin coverage (Use Novolog with meals when patient is taking orally).    Acute kidney injury Creatinine on admission elevated up to 1.97 with BUN 25.  -Likely prerenal.   -Serum creatinine  is 1.21.   Marijuana use -Patient reports intermittent use of marijuana. -UDS was positive for tetrahydrocannabinol. -Counseled to quit marijuana use.  Hypokalemia: -Potassium is 3.5. -Continue to monitor and replete. -Last magnesium level was 1.9.  Hypophosphatemia: -IV K-Phos. -Continue to monitor and replete.  Rule out diabetes gastroparesis: -IV Reglan for now. -Gastric emptying studies. -Last EKG revealed QTc interval of 449 ms. -Repeat EKG today.  DVT prophylaxis: Lovenox. Code Status: Full code. Family Communication:  Disposition Plan: Home eventually.   Consultants:  None.  Procedures:  None.  Antimicrobials:  None.   Subjective: Patient continues to report nausea.  Objective: Vitals:   05/19/23 0450 05/19/23 0600 05/19/23 0822 05/19/23 1140  BP: (!) 167/97  (!) 164/104 (!) 155/91  Pulse: 76  79 80  Resp: 17 15 18 18   Temp:   100.1 F (37.8 C) 99.8 F (37.7 C)  TempSrc:   Oral Oral  SpO2: 99%  100% 98%  Weight:      Height:        Intake/Output Summary (Last 24 hours) at 05/19/2023 1207 Last data filed at 05/19/2023 1140 Gross per 24 hour  Intake 3917.92 ml  Output 2025 ml  Net 1892.92 ml   Filed Weights   05/17/23 2125 05/18/23 0425 05/19/23 0438  Weight: 74.7 kg 74.7 kg 76.2 kg    Examination: General exam: Appears calm and comfortable  Respiratory system: Clear to auscultation.  Cardiovascular system: S1 & S2 heard Gastrointestinal system: Abdomen is soft and nontender.  Central nervous system: Awake and alert.  Patient moves all extremities.   Extremities: No leg edema.  Data Reviewed: I have personally  reviewed following labs and imaging studies  CBC: Recent Labs  Lab 05/15/23 2354 05/15/23 2356 05/17/23 1115 05/17/23 1145 05/17/23 1329 05/18/23 0034 05/19/23 0056  WBC 10.7*  --   --  15.7*  --  18.3* 13.2*  NEUTROABS  --   --   --   --   --   --  10.1*  HGB 16.3   < > 17.7* 14.0 17.7* 13.6 12.8*  HCT 48.6   < >  52.0 40.7 52.0 40.1 37.6*  MCV 92.2  --   --  90.8  --  93.3 92.4  PLT 294  --   --  295  --  300 265   < > = values in this interval not displayed.   Basic Metabolic Panel: Recent Labs  Lab 05/15/23 2354 05/15/23 2356 05/17/23 1039 05/17/23 1114 05/17/23 1115 05/17/23 1329 05/17/23 1609 05/18/23 0034 05/19/23 0056 05/19/23 0057  NA 127*   < > 130*   < > 133* 132* 135 138  --  136  K 3.9   < > 3.8   < > 4.3 4.6 3.8 3.4*  --  3.5  CL 92*  --  89*  --  92*  --  95* 95*  --  100  CO2 21*  --  21*  --   --   --  28 28  --  29  GLUCOSE 113*  --  313*  --  316*  --  190* 148*  --  59*  BUN 14  --  25*  --  27*  --  23* 20  --  13  CREATININE 1.35*  --  1.97*  --  1.90*  --  1.69* 1.49*  --  1.21  CALCIUM 10.0  --  9.6  --   --   --  9.1 9.4  --  8.8*  MG  --   --  1.7  --   --   --   --   --  1.9  --   PHOS  --   --   --   --   --   --   --   --   --  2.4*   < > = values in this interval not displayed.   GFR: Estimated Creatinine Clearance: 89.9 mL/min (by C-G formula based on SCr of 1.21 mg/dL). Liver Function Tests: Recent Labs  Lab 05/15/23 2354 05/17/23 1039 05/19/23 0057  AST 19 22  --   ALT 17 21  --   ALKPHOS 58 60  --   BILITOT 1.6* 1.2  --   PROT 8.0 7.4  --   ALBUMIN 4.7 4.5 3.3*   Recent Labs  Lab 05/15/23 2354  LIPASE 24   No results for input(s): "AMMONIA" in the last 168 hours. Coagulation Profile: No results for input(s): "INR", "PROTIME" in the last 168 hours. Cardiac Enzymes: No results for input(s): "CKTOTAL", "CKMB", "CKMBINDEX", "TROPONINI" in the last 168 hours. BNP (last 3 results) No results for input(s): "PROBNP" in the last 8760 hours. HbA1C: No results for input(s): "HGBA1C" in the last 72 hours. CBG: Recent Labs  Lab 05/18/23 1532 05/18/23 2049 05/19/23 0504 05/19/23 0526 05/19/23 1137  GLUCAP 166* 165* 64* 150* 183*   Lipid Profile: No results for input(s): "CHOL", "HDL", "LDLCALC", "TRIG", "CHOLHDL", "LDLDIRECT" in the last  72 hours. Thyroid Function Tests: No results for input(s): "TSH", "T4TOTAL", "FREET4", "T3FREE", "THYROIDAB" in the last 72 hours. Anemia Panel: No results for  input(s): "VITAMINB12", "FOLATE", "FERRITIN", "TIBC", "IRON", "RETICCTPCT" in the last 72 hours. Urine analysis:    Component Value Date/Time   COLORURINE YELLOW 05/18/2023 0045   APPEARANCEUR CLEAR 05/18/2023 0045   APPEARANCEUR Clear 10/11/2020 1442   LABSPEC 1.021 05/18/2023 0045   LABSPEC 1.015 04/17/2023 1421   PHURINE 7.0 05/18/2023 0045   GLUCOSEU 150 (A) 05/18/2023 0045   HGBUR NEGATIVE 05/18/2023 0045   BILIRUBINUR NEGATIVE 05/18/2023 0045   BILIRUBINUR negative 04/17/2023 1421   BILIRUBINUR neg 01/09/2021 1044   BILIRUBINUR Negative 10/11/2020 1442   KETONESUR 20 (A) 05/18/2023 0045   PROTEINUR 100 (A) 05/18/2023 0045   UROBILINOGEN 1.0 07/13/2021 1101   UROBILINOGEN 0.2 01/19/2018 0838   NITRITE NEGATIVE 05/18/2023 0045   LEUKOCYTESUR NEGATIVE 05/18/2023 0045   Sepsis Labs: @LABRCNTIP (procalcitonin:4,lacticidven:4)  )No results found for this or any previous visit (from the past 240 hour(s)).       Radiology Studies: No results found.      Scheduled Meds:  capsicum   Topical BID   carvedilol  3.125 mg Oral BID   enoxaparin (LOVENOX) injection  40 mg Subcutaneous Q24H   insulin aspart  0-9 Units Subcutaneous TID AC & HS   insulin aspart  3 Units Subcutaneous TID WC   insulin glargine-yfgn  20 Units Subcutaneous Q24H   lisinopril  20 mg Oral Daily   metoCLOPramide (REGLAN) injection  5 mg Intravenous Q6H   sodium chloride flush  3 mL Intravenous Q12H   Continuous Infusions:  dextrose 5 % and 0.9 % NaCl with KCl 20 mEq/L 50 mL/hr at 05/19/23 1030   potassium PHOSPHATE IVPB (in mmol)       LOS: 2 days    Time spent: 35 Minutes.    Berton Mount, MD  [facility] Pager #: [phone] 7PM-7AM contact night coverage as above

## 2023-05-19 NOTE — TOC Initial Note (Signed)
Transition of Care [facility]) - Initial/Assessment Note    Patient Details  Name: Zachary Vazquez MRN: [id number] Date of Birth: 08/09/1986  Transition of Care [facility]) CM/SW Contact:    Leone Haven, RN Phone Number: 05/19/2023, 7:23 PM  Clinical Narrative:                 From home with his Mom, he has PCP, Dr. Tanda Rockers, he has insurance on file, was checked by Meriam Sprague in financial counseling ,insurance is active.  He states he currently does not have any HH services in place, he has no DME at home.  He states he will need a cab at dc for transport.  He states his mom is his support system.  [facility], pta self ambulatory.    Expected Discharge Plan: Home/Self Care Barriers to Discharge: Continued Medical Work up   Patient Goals and CMS Choice Patient states their goals for this hospitalization and ongoing recovery are:: return home   Choice offered to / list presented to : NA      Expected Discharge Plan and Services In-house Referral: NA Discharge Planning Services: CM Consult Post Acute Care Choice: NA Living arrangements for the past 2 months: Single Family Home                 DME Arranged: N/A DME Agency: NA       HH Arranged: NA          Prior Living Arrangements/Services Living arrangements for the past 2 months: Single Family Home Lives with:: Parents (Mother) Patient language and need for interpreter reviewed:: Yes Do you feel safe going back to the place where you live?: Yes      Need for Family Participation in Patient Care: Yes (Comment) Care giver support system in place?: Yes (comment)   Criminal Activity/Legal Involvement Pertinent to Current Situation/Hospitalization: No - Comment as needed  Activities of Daily Living Home Assistive Devices/Equipment: CBG Meter ADL Screening (condition at time of admission) Patient's cognitive ability adequate to safely complete daily activities?: Yes Is the patient deaf or  have difficulty hearing?: No Does the patient have difficulty seeing, even when wearing glasses/contacts?: No Does the patient have difficulty concentrating, remembering, or making decisions?: No Patient able to express need for assistance with ADLs?: Yes Does the patient have difficulty dressing or bathing?: No Independently performs ADLs?: Yes (appropriate for developmental age) Does the patient have difficulty walking or climbing stairs?: No Weakness of Legs: None Weakness of Arms/Hands: None  Permission Sought/Granted Permission sought to share information with : Case Manager Permission granted to share information with : Yes, Verbal Permission Granted              Emotional Assessment   Attitude/Demeanor/Rapport: Engaged Affect (typically observed): Appropriate Orientation: : Oriented to Self, Oriented to Place, Oriented to  Time, Oriented to Situation Alcohol / Substance Use: Not Applicable Psych Involvement: No (comment)  Admission diagnosis:  Hyperglycemia [R73.9] AKI (acute kidney injury) (HCC) [N17.9] DKA, type 1 (HCC) [E10.10] Nausea and vomiting, unspecified vomiting type [R11.2] Patient Active Problem List   Diagnosis Date Noted   Marijuana use 05/17/2023   DKA (diabetic ketoacidosis) (HCC) 04/24/2023   Hyperkalemia 04/24/2023   Nausea and vomiting 04/04/2023   Diabetic ketoacidosis associated with type 1 diabetes mellitus (HCC) 04/03/2023   Hypertension associated with diabetes (HCC) 04/03/2023   Hypoglycemia 08/27/2021   Acute metabolic encephalopathy 08/27/2021   Gastritis 05/14/2020   Uncontrolled type 1 diabetes  mellitus with ketoacidosis without coma, with long-term current use of insulin (HCC) 05/10/2020   SIRS (systemic inflammatory response syndrome) (HCC) 05/10/2020   Nicotine dependence, cigarettes, uncomplicated 05/10/2020   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   Hyperglycemia 04/27/2020   History of delayed wound healing 04/16/2020   Hemoglobin A1c  less than 7.0% 04/16/2020   Allergies 04/16/2020   Rash and nonspecific skin eruption 11/23/2019   Spider bite 11/18/2019   Wound of left leg 11/18/2019   Intractable nausea and vomiting 05/04/2019   DKA, type 1 (HCC) 05/04/2019   Nausea 12/02/2017   Tobacco use disorder 03/30/2017   Type 1 diabetes mellitus (HCC) 03/30/2017   Leukocytosis 03/30/2017   AKI (acute kidney injury) (HCC) 03/30/2017   PCP:  Ivonne Andrew, NP Pharmacy:   [facility] [address], [address] [place] [address] Phone: [phone] Fax: [phone]     Social Determinants of Health (SDOH) Social History: SDOH Screenings   Food Insecurity: No Food Insecurity (05/17/2023)  Housing: Low Risk  (05/17/2023)  Transportation Needs: No Transportation Needs (05/17/2023)  Utilities: Not At Risk (05/17/2023)  Depression (PHQ2-9): Low Risk  (04/14/2023)  Tobacco Use: High Risk (05/17/2023)   SDOH Interventions:     Readmission Risk Interventions    05/19/2023    7:20 PM  Readmission Risk Prevention Plan  Transportation Screening Complete  HRI or Home Care Consult Complete  Palliative Care Screening Not Applicable  Medication Review (RN Care Manager) Complete

## 2023-05-20 ENCOUNTER — Inpatient Hospital Stay (HOSPITAL_COMMUNITY): Payer: Medicaid Other

## 2023-05-20 ENCOUNTER — Other Ambulatory Visit: Payer: Self-pay

## 2023-05-20 DIAGNOSIS — R112 Nausea with vomiting, unspecified: Secondary | ICD-10-CM | POA: Diagnosis not present

## 2023-05-20 LAB — GLUCOSE, CAPILLARY
Glucose-Capillary: 127 mg/dL — ABNORMAL HIGH (ref 70–99)
Glucose-Capillary: 150 mg/dL — ABNORMAL HIGH (ref 70–99)

## 2023-05-20 LAB — CBG MONITORING, ED: Glucose-Capillary: 107 mg/dL — ABNORMAL HIGH (ref 70–99)

## 2023-05-20 LAB — BASIC METABOLIC PANEL
Anion gap: 11 (ref 5–15)
BUN: 12 mg/dL (ref 6–20)
CO2: 26 mmol/L (ref 22–32)
Calcium: 8.7 mg/dL — ABNORMAL LOW (ref 8.9–10.3)
Chloride: 100 mmol/L (ref 98–111)
Creatinine, Ser: 1.14 mg/dL (ref 0.61–1.24)
GFR, Estimated: >60 mL/min (ref >60)
Glucose, Bld: 141 mg/dL — ABNORMAL HIGH (ref 70–99)
Potassium: 3.9 mmol/L (ref 3.5–5.1)
Sodium: 137 mmol/L (ref 135–145)

## 2023-05-20 MED ORDER — ONDANSETRON 4 MG PO TBDP
4.0000 mg | ORAL_TABLET | Freq: Three times a day (TID) | ORAL | 0 refills | Status: DC | PRN
Start: 2023-05-20 — End: 2024-04-15
  Filled 2023-05-20: qty 20, 7d supply, fill #0

## 2023-05-20 MED ORDER — LISINOPRIL 20 MG PO TABS
20.0000 mg | ORAL_TABLET | Freq: Every day | ORAL | 0 refills | Status: DC
  Filled 2023-05-20: qty 30, 30d supply, fill #0

## 2023-05-20 MED ORDER — METOCLOPRAMIDE HCL 10 MG PO TABS: 10.0000 mg | ORAL_TABLET | Freq: Four times a day (QID) | ORAL | 0 refills | Status: DC

## 2023-05-20 MED ORDER — CARVEDILOL 3.125 MG PO TABS
3.1250 mg | ORAL_TABLET | Freq: Two times a day (BID) | ORAL | 0 refills | Status: DC
  Filled 2023-05-20: qty 60, 30d supply, fill #0

## 2023-05-20 MED ORDER — HYDROCORTISONE 1 % EX CREA: 1.0000 | TOPICAL_CREAM | Freq: Three times a day (TID) | CUTANEOUS | Status: DC | PRN

## 2023-05-20 NOTE — Discharge Summary (Signed)
Physician Discharge Summary  Zachary Vazquez QMV:[id number] DOB: [birth year] DOA: 05/17/2023  PCP: Ivonne Andrew, NP  Admit date: 05/17/2023  Discharge date: 05/20/2023  Admitted From:Home  Disposition:  Home  Recommendations for Outpatient Follow-up:  Follow up with PCP in 1-2 weeks Patient refused gastric emptying study and remains at high risk for readmission given his issues with recurrent DKA and ongoing marijuana use Given refills with Reglan and Zofran for nausea and vomiting Noted to be hypertensive and started on Coreg and lisinopril with prescription provided Continue other home medications as prior and monitor blood glucose carefully.  Home Health: None  Equipment/Devices: None  Discharge Condition:Stable  CODE STATUS: Full  Diet recommendation: Heart Healthy/carb hide  Brief/Interim Summary: Patient is a 37 year old male with past medical history significant for type 1 diabetes mellitus, recurrent admissions for diabetes ketoacidosis, cannabis use, nausea and vomiting.  Patient was admitted with another episode of nausea, vomiting and elevated blood sugar.  She appears to have cannabis hyperemesis syndrome versus diabetic gastroparesis which has not been formally diagnosed.  He was planned to undergo gastric emptying study, but has no further symptoms and refuses the study and would like to go home.  He is currently in stable condition for discharge and has some elevated blood pressure readings, however he missed some of his morning doses of antihypertensives on account of waiting for the study.  He has however had good blood pressure control with Coreg and lisinopril as prescribed.  No other acute events or concerns noted and he remains a high risk for readmission.  Discharge Diagnoses:  Principal Problem:   Intractable nausea and vomiting Active Problems:   DKA, type 1 (HCC)   AKI (acute kidney injury) (HCC)   Marijuana use  Principal discharge diagnosis:  Recurrent intractable nausea and vomiting related to cannabinoid hyperemesis syndrome versus gastroparesis.  Discharge Instructions  Discharge Instructions     Diet - low sodium heart healthy   Complete by: As directed    Increase activity slowly   Complete by: As directed       Allergies as of 05/20/2023       Reactions   Clindamycin/lincomycin Anaphylaxis   Bactrim [sulfamethoxazole-trimethoprim] Swelling, Other (See Comments)   Eyes swell and bumps appear on/near the eyes         Medication List     TAKE these medications    acetaminophen 325 MG tablet Commonly known as: TYLENOL Take 2 tablets (650 mg total) by mouth every 6 (six) hours as needed for mild pain (or Fever >/= 101).   carvedilol 3.125 MG tablet Commonly known as: COREG Take 1 tablet (3.125 mg total) by mouth 2 (two) times daily.   docusate sodium 100 MG capsule Commonly known as: Colace Take 1 capsule (100 mg total) by mouth 2 (two) times daily.   FreeStyle Onward 2 Reader [place] 1 application. by Does not apply route every 14 (fourteen) days.   FreeStyle Libre 2 Sensor Misc Use to check glucose   Lantus 100 UNIT/ML injection Generic drug: insulin glargine Inject 0.28 mLs (28 Units total) into the skin at bedtime.   lisinopril 20 MG tablet Commonly known as: ZESTRIL Take 1 tablet (20 mg total) by mouth daily. Start taking on: May 21, 2023   metoCLOPramide 10 MG tablet Commonly known as: REGLAN Take 1 tablet (10 mg total) by mouth every 6 (six) hours.   NovoLOG 100 UNIT/ML injection Generic drug: insulin aspart Use as directed per sliding scale What changed:  how to take this when to take this additional instructions   ondansetron 4 MG disintegrating tablet Commonly known as: ZOFRAN-ODT Take 1 tablet (4 mg total) by mouth every 8 (eight) hours as needed for nausea or vomiting.   pantoprazole 40 MG tablet Commonly known as: Protonix Take 1 tablet (40 mg total) by mouth 2 (two) times  daily before a meal for 15 days.   polyethylene glycol powder 17 GM/SCOOP powder Commonly known as: GLYCOLAX/MIRALAX Mix 1capful (17 g) with 4-8 ounces with water/juice by mouth 2 (two) times daily as needed.   Restora Caps Take 1 capsule by mouth daily.        Follow-up Information     Ivonne Andrew, NP. Schedule an appointment as soon as possible for a visit in 1 week(s).   Specialties: Pulmonary Disease, Endocrinology Contact information: [address] [place] [address] [phone]                Allergies  Allergen Reactions   Clindamycin/Lincomycin Anaphylaxis   Bactrim [Sulfamethoxazole-Trimethoprim] Swelling and Other (See Comments)    Eyes swell and bumps appear on/near the eyes     Consultations: None   Procedures/Studies: DG Chest Portable 1 View  Result Date: 05/17/2023 CLINICAL DATA:  Hematemesis. EXAM: PORTABLE CHEST 1 VIEW COMPARISON:  04/24/2023 FINDINGS: The lungs are clear without focal pneumonia, edema, pneumothorax or pleural effusion. The cardiopericardial silhouette is within normal limits for size. No acute bony abnormality. Telemetry leads overlie the chest. IMPRESSION: No active disease. Electronically Signed   By: Kennith Center M.D.   On: 05/17/2023 11:27   DG Chest Portable 1 View  Result Date: 04/24/2023 CLINICAL DATA:  37 year old male with hyperglycemia. Shortness of breath, nausea vomiting. EXAM: PORTABLE CHEST 1 VIEW COMPARISON:  Chest radiographs 11/14/2021 and earlier. FINDINGS: Portable AP semi upright view at 1034 hours. Normalized lung volumes. Normal cardiac size and mediastinal contours. Visualized tracheal air column is within normal limits. Allowing for portable technique the lungs are clear. No pneumothorax or pleural effusion. No osseous abnormality identified. Paucity of bowel gas in the visible abdomen. IMPRESSION: Negative portable chest. Electronically Signed   By: Odessa Fleming M.D.   On: 04/24/2023 10:58      Discharge Exam: Vitals:   05/20/23 0717 05/20/23 1102  BP: (!) 153/98 (!) 171/103  Pulse: 74 72  Resp: 17 18  Temp: 99.8 F (37.7 C) 99.3 F (37.4 C)  SpO2: 98% 99%   Vitals:   05/20/23 0022 05/20/23 0611 05/20/23 0717 05/20/23 1102  BP: (!) 164/106 (!) 160/105 (!) 153/98 (!) 171/103  Pulse:  94 74 72  Resp:  18 17 18   Temp:  99.7 F (37.6 C) 99.8 F (37.7 C) 99.3 F (37.4 C)  TempSrc:  Oral Oral Oral  SpO2:  98% 98% 99%  Weight:  74.3 kg    Height:        General: Pt is alert, awake, not in acute distress Cardiovascular: RRR, S1/S2 +, no rubs, no gallops Respiratory: CTA bilaterally, no wheezing, no rhonchi Abdominal: Soft, NT, ND, bowel sounds + Extremities: no edema, no cyanosis    The results of significant diagnostics from this hospitalization (including imaging, microbiology, ancillary and laboratory) are listed below for reference.     Microbiology: No results found for this or any previous visit (from the past 240 hour(s)).   Labs: BNP (last 3 results) No results for input(s): "BNP" in the last 8760 hours. Basic Metabolic Panel: Recent Labs  Lab 05/17/23 1039 05/17/23 1114 05/17/23 1115 05/17/23 1329 05/17/23 1609 05/18/23 0034 05/19/23 0056 05/19/23 0057 05/20/23 0245  NA 130*   < > 133* 132* 135 138  --  136 137  K 3.8   < > 4.3 4.6 3.8 3.4*  --  3.5 3.9  CL 89*  --  92*  --  95* 95*  --  100 100  CO2 21*  --   --   --  28 28  --  29 26  GLUCOSE 313*  --  316*  --  190* 148*  --  59* 141*  BUN 25*  --  27*  --  23* 20  --  13 12  CREATININE 1.97*  --  1.90*  --  1.69* 1.49*  --  1.21 1.14  CALCIUM 9.6  --   --   --  9.1 9.4  --  8.8* 8.7*  MG 1.7  --   --   --   --   --  1.9  --   --   PHOS  --   --   --   --   --   --   --  2.4*  --    < > = values in this interval not displayed.   Liver Function Tests: Recent Labs  Lab 05/15/23 2354 05/17/23 1039 05/19/23 0057  AST 19 22  --   ALT 17 21  --   ALKPHOS 58 60  --   BILITOT  1.6* 1.2  --   PROT 8.0 7.4  --   ALBUMIN 4.7 4.5 3.3*   Recent Labs  Lab 05/15/23 2354  LIPASE 24   No results for input(s): "AMMONIA" in the last 168 hours. CBC: Recent Labs  Lab 05/15/23 2354 05/15/23 2356 05/17/23 1115 05/17/23 1145 05/17/23 1329 05/18/23 0034 05/19/23 0056  WBC 10.7*  --   --  15.7*  --  18.3* 13.2*  NEUTROABS  --   --   --   --   --   --  10.1*  HGB 16.3   < > 17.7* 14.0 17.7* 13.6 12.8*  HCT 48.6   < > 52.0 40.7 52.0 40.1 37.6*  MCV 92.2  --   --  90.8  --  93.3 92.4  PLT 294  --   --  295  --  300 265   < > = values in this interval not displayed.   Cardiac Enzymes: No results for input(s): "CKTOTAL", "CKMB", "CKMBINDEX", "TROPONINI" in the last 168 hours. BNP: Invalid input(s): "POCBNP" CBG: Recent Labs  Lab 05/19/23 1137 05/19/23 1558 05/19/23 2101 05/20/23 0612 05/20/23 1100  GLUCAP 183* 212* 200* 127* 150*   D-Dimer No results for input(s): "DDIMER" in the last 72 hours. Hgb A1c No results for input(s): "HGBA1C" in the last 72 hours. Lipid Profile No results for input(s): "CHOL", "HDL", "LDLCALC", "TRIG", "CHOLHDL", "LDLDIRECT" in the last 72 hours. Thyroid function studies No results for input(s): "TSH", "T4TOTAL", "T3FREE", "THYROIDAB" in the last 72 hours.  Invalid input(s): "FREET3" Anemia work up No results for input(s): "VITAMINB12", "FOLATE", "FERRITIN", "TIBC", "IRON", "RETICCTPCT" in the last 72 hours. Urinalysis    Component Value Date/Time   COLORURINE YELLOW 05/18/2023 0045   APPEARANCEUR CLEAR 05/18/2023 0045   APPEARANCEUR Clear 10/11/2020 1442   LABSPEC 1.021 05/18/2023 0045   LABSPEC 1.015 04/17/2023 1421   PHURINE 7.0 05/18/2023 0045   GLUCOSEU 150 (A) 05/18/2023 0045   HGBUR NEGATIVE 05/18/2023 0045  BILIRUBINUR NEGATIVE 05/18/2023 0045   BILIRUBINUR negative 04/17/2023 1421   BILIRUBINUR neg 01/09/2021 1044   BILIRUBINUR Negative 10/11/2020 1442   KETONESUR 20 (A) 05/18/2023 0045   PROTEINUR 100 (A)  05/18/2023 0045   UROBILINOGEN 1.0 07/13/2021 1101   UROBILINOGEN 0.2 01/19/2018 0838   NITRITE NEGATIVE 05/18/2023 0045   LEUKOCYTESUR NEGATIVE 05/18/2023 0045   Sepsis Labs Recent Labs  Lab 05/15/23 2354 05/17/23 1145 05/18/23 0034 05/19/23 0056  WBC 10.7* 15.7* 18.3* 13.2*   Microbiology No results found for this or any previous visit (from the past 240 hour(s)).   Time coordinating discharge: 35 minutes  SIGNED:   Erick Blinks, DO [facility] 05/20/2023, 11:34 AM  If 7PM-7AM, please contact night-coverage [email or web address]

## 2023-05-20 NOTE — Plan of Care (Signed)
Patient ID: Zachary Vazquez, male   DOB: 1986/03/27, 37 y.o.   MRN: [id number]  Problem: Education: Goal: Knowledge of General Education information will improve Description: Including pain rating scale, medication(s)/side effects and non-pharmacologic comfort measures Outcome: Adequate for Discharge   Problem: Health Behavior/Discharge Planning: Goal: Ability to manage health-related needs will improve Outcome: Adequate for Discharge   Problem: Clinical Measurements: Goal: Ability to maintain clinical measurements within normal limits will improve Outcome: Adequate for Discharge Goal: Will remain free from infection Outcome: Adequate for Discharge Goal: Diagnostic test results will improve Outcome: Adequate for Discharge Goal: Respiratory complications will improve Outcome: Adequate for Discharge Goal: Cardiovascular complication will be avoided Outcome: Adequate for Discharge   Problem: Activity: Goal: Risk for activity intolerance will decrease Outcome: Adequate for Discharge   Problem: Nutrition: Goal: Adequate nutrition will be maintained Outcome: Adequate for Discharge   Problem: Coping: Goal: Level of anxiety will decrease Outcome: Adequate for Discharge   Problem: Elimination: Goal: Will not experience complications related to bowel motility Outcome: Adequate for Discharge Goal: Will not experience complications related to urinary retention Outcome: Adequate for Discharge   Problem: Pain Managment: Goal: General experience of comfort will improve Outcome: Adequate for Discharge   Problem: Safety: Goal: Ability to remain free from injury will improve Outcome: Adequate for Discharge   Problem: Skin Integrity: Goal: Risk for impaired skin integrity will decrease Outcome: Adequate for Discharge   Problem: Education: Goal: Knowledge of General Education information will improve Description: Including pain rating scale, medication(s)/side effects and  non-pharmacologic comfort measures Outcome: Adequate for Discharge   Problem: Health Behavior/Discharge Planning: Goal: Ability to manage health-related needs will improve Outcome: Adequate for Discharge   Problem: Clinical Measurements: Goal: Ability to maintain clinical measurements within normal limits will improve Outcome: Adequate for Discharge Goal: Will remain free from infection Outcome: Adequate for Discharge Goal: Diagnostic test results will improve Outcome: Adequate for Discharge Goal: Respiratory complications will improve Outcome: Adequate for Discharge Goal: Cardiovascular complication will be avoided Outcome: Adequate for Discharge   Problem: Activity: Goal: Risk for activity intolerance will decrease Outcome: Adequate for Discharge   Problem: Nutrition: Goal: Adequate nutrition will be maintained Outcome: Adequate for Discharge   Problem: Coping: Goal: Level of anxiety will decrease Outcome: Adequate for Discharge   Problem: Elimination: Goal: Will not experience complications related to bowel motility Outcome: Adequate for Discharge Goal: Will not experience complications related to urinary retention Outcome: Adequate for Discharge   Problem: Pain Managment: Goal: General experience of comfort will improve Outcome: Adequate for Discharge   Problem: Safety: Goal: Ability to remain free from injury will improve Outcome: Adequate for Discharge   Problem: Skin Integrity: Goal: Risk for impaired skin integrity will decrease Outcome: Adequate for Discharge   Problem: Education: Goal: Ability to describe self-care measures that may prevent or decrease complications (Diabetes Survival Skills Education) will improve Outcome: Adequate for Discharge Goal: Individualized Educational Video(s) Outcome: Adequate for Discharge   Problem: Coping: Goal: Ability to adjust to condition or change in health will improve Outcome: Adequate for Discharge   Problem:  Fluid Volume: Goal: Ability to maintain a balanced intake and output will improve Outcome: Adequate for Discharge   Problem: Health Behavior/Discharge Planning: Goal: Ability to identify and utilize available resources and services will improve Outcome: Adequate for Discharge Goal: Ability to manage health-related needs will improve Outcome: Adequate for Discharge   Problem: Metabolic: Goal: Ability to maintain appropriate glucose levels will improve Outcome: Adequate for Discharge   Problem: Nutritional:  Goal: Maintenance of adequate nutrition will improve Outcome: Adequate for Discharge Goal: Progress toward achieving an optimal weight will improve Outcome: Adequate for Discharge   Problem: Skin Integrity: Goal: Risk for impaired skin integrity will decrease Outcome: Adequate for Discharge   Problem: Tissue Perfusion: Goal: Adequacy of tissue perfusion will improve Outcome: Adequate for Discharge    Lidia Collum, RN

## 2023-05-20 NOTE — Plan of Care (Signed)
  Problem: Nutrition: Goal: Adequate nutrition will be maintained Outcome: Progressing   Problem: Health Behavior/Discharge Planning: Goal: Ability to identify and utilize available resources and services will improve Outcome: Progressing Goal: Ability to manage health-related needs will improve Outcome: Progressing   Problem: Metabolic: Goal: Ability to maintain appropriate glucose levels will improve Outcome: Progressing   Problem: Nutritional: Goal: Maintenance of adequate nutrition will improve Outcome: Progressing

## 2023-05-20 NOTE — TOC Transition Note (Addendum)
Transition of Care [facility]) - CM/SW Discharge Note   Patient Details  Name: Zachary Vazquez MRN: [id number] Date of Birth: [birth year]  Transition of Care [facility]) CM/SW Contact:  Leone Haven, RN Phone Number: 05/20/2023, 11:51 AM   Clinical Narrative:     Patient is for dc today, he has no needs. NCM assisted with cab Voucher.    Barriers to Discharge: Continued Medical Work up   Patient Goals and CMS Choice   Choice offered to / list presented to : NA  Discharge Placement                         Discharge Plan and Services Additional resources added to the After Visit Summary for   In-house Referral: NA Discharge Planning Services: CM Consult Post Acute Care Choice: NA          DME Arranged: N/A DME Agency: NA       HH Arranged: NA          Social Determinants of Health (SDOH) Interventions SDOH Screenings   Food Insecurity: No Food Insecurity (05/17/2023)  Housing: Low Risk  (05/17/2023)  Transportation Needs: No Transportation Needs (05/17/2023)  Utilities: Not At Risk (05/17/2023)  Depression (PHQ2-9): Low Risk  (04/14/2023)  Tobacco Use: High Risk (05/17/2023)     Readmission Risk Interventions    05/19/2023    7:20 PM  Readmission Risk Prevention Plan  Transportation Screening Complete  HRI or Home Care Consult Complete  Palliative Care Screening Not Applicable  Medication Review (RN Care Manager) Complete

## 2023-05-21 ENCOUNTER — Telehealth: Payer: Self-pay

## 2023-05-21 NOTE — Transitions of Care (Post Inpatient/ED Visit) (Signed)
05/21/2023  Name: Zachary Vazquez MRN: [id number] DOB: 1986-08-31  Today's TOC FU Call Status: Today's TOC FU Call Status:: Successful TOC FU Call Competed TOC FU Call Complete Date: 05/21/23  Transition Care Management Follow-up Telephone Call Date of Discharge: 05/20/23 Discharge Facility: [facility]) Type of Discharge: Inpatient Admission Primary Inpatient Discharge Diagnosis:: Intractable nausea and vomiting How have you been since you were released from the hospital?: Better Any questions or concerns?: No  Items Reviewed: Did you receive and understand the discharge instructions provided?: Yes Medications obtained,verified, and reconciled?: Yes (Medications Reviewed) Any new allergies since your discharge?: No Dietary orders reviewed?: Yes Do you have support at home?: Yes  Medications Reviewed Today: Medications Reviewed Today     Reviewed by Merleen Nicely, LPN (Licensed Practical Nurse) on 05/21/23 at 0919  Med List Status: <None>   Medication Order Taking? Sig Documenting Provider Last Dose Status Informant  acetaminophen (TYLENOL) 325 MG tablet [id number] Yes Take 2 tablets (650 mg total) by mouth every 6 (six) hours as needed for mild pain (or Fever >/= 101). Vonna Drafts, MD Taking Active Self, Pharmacy Records  carvedilol (COREG) 3.125 MG tablet [id number] Yes Take 1 tablet (3.125 mg total) by mouth 2 (two) times daily. Maurilio Lovely D, DO Taking Active   Continuous Blood Gluc Receiver (FREESTYLE LIBRE 2 READER) DEVI [id number] No 1 application. by Does not apply route every 14 (fourteen) days.  Patient not taking: Reported on 04/28/2023   Barbette Merino, NP Not Taking Active Self, Pharmacy Records  Continuous Blood Gluc Sensor (FREESTYLE LIBRE 2 SENSOR) [place] [id number] No Use to check glucose  Patient not taking: Reported on 04/28/2023   Ivonne Andrew, NP Not Taking Active Self, Pharmacy Records  docusate sodium (COLACE) 100 MG capsule [id number] Yes Take 1  capsule (100 mg total) by mouth 2 (two) times daily. Ivonne Andrew, NP Taking Active Self, Pharmacy Records  insulin aspart (NOVOLOG) 100 UNIT/ML injection [id number] Yes Use as directed per sliding scale Ivonne Andrew, NP Taking Active Self, Pharmacy Records           Med Note Antony Madura, Arn Medal   Thu Apr 24, 2023  2:14 PM)    insulin glargine (LANTUS) 100 UNIT/ML injection [id number] Yes Inject 0.28 mLs (28 Units total) into the skin at bedtime. Ivonne Andrew, NP Taking Active Self, Pharmacy Records  lisinopril (ZESTRIL) 20 MG tablet [id number] Yes Take 1 tablet (20 mg total) by mouth daily. Sherryll Burger, Pratik D, DO Taking Active   metoCLOPramide (REGLAN) 10 MG tablet [id number] Yes Take 1 tablet (10 mg total) by mouth every 6 (six) hours. Sherryll Burger, Pratik D, DO Taking Active   ondansetron (ZOFRAN-ODT) 4 MG disintegrating tablet [id number] Yes Take 1 tablet (4 mg total) by mouth every 8 (eight) hours as needed for nausea or vomiting. Sherryll Burger, Pratik D, DO Taking Active   pantoprazole (PROTONIX) 40 MG tablet [id number] No Take 1 tablet (40 mg total) by mouth 2 (two) times daily before a meal for 15 days.  Patient not taking: Reported on 05/17/2023   Glade Lloyd, MD Not Taking Expired 05/17/23 2359   polyethylene glycol powder (GLYCOLAX/MIRALAX) 17 GM/SCOOP powder [id number] No Mix 1capful (17 g) with 4-8 ounces with water/juice by mouth 2 (two) times daily as needed.  Patient not taking: Reported on 05/16/2023   Ivonne Andrew, NP Not Taking Active Self, Pharmacy Records  Probiotic Product [facility]) CAPS [id number] No Take 1 capsule by mouth daily.  Patient not  taking: Reported on 05/16/2023   Ivonne Andrew, NP Not Taking Active Self, Pharmacy Records            Home Care and Equipment/Supplies: Were Home Health Services Ordered?: No Any new equipment or medical supplies ordered?: No  Functional Questionnaire: Do you need assistance with bathing/showering or dressing?: No Do you need assistance  with meal preparation?: No Do you need assistance with eating?: No Do you have difficulty maintaining continence: No Do you need assistance with getting out of bed/getting out of a chair/moving?: No Do you have difficulty managing or taking your medications?: No  Follow up appointments reviewed: PCP Follow-up appointment confirmed?: (S) No (pt hung up before i could make an appt - will ask front dest to call pt to schedule fu appt prior to 06-03-23) MD Provider Line Number:[phone] Given: Yes Follow-up Provider: Lenora Boys NP [facility] Follow-up appointment confirmed?: No Do you need transportation to your follow-up appointment?: No Do you understand care options if your condition(s) worsen?: Yes-patient verbalized understanding    SIGNATURE  Woodfin Ganja LPN [facility] Nurse Health Advisor Direct Dial [phone]

## 2023-06-02 ENCOUNTER — Observation Stay (HOSPITAL_COMMUNITY): Payer: Medicaid Other

## 2023-06-02 ENCOUNTER — Other Ambulatory Visit: Payer: Self-pay

## 2023-06-02 ENCOUNTER — Encounter (HOSPITAL_COMMUNITY): Payer: Self-pay | Admitting: Radiology

## 2023-06-02 ENCOUNTER — Observation Stay (HOSPITAL_COMMUNITY)
Admission: EM | Admit: 2023-06-02 | Discharge: 2023-06-02 | Disposition: A | Discharge status: Home / Self Care | Payer: Medicaid Other | Attending: Internal Medicine | Admitting: Internal Medicine

## 2023-06-02 DIAGNOSIS — F17210 Nicotine dependence, cigarettes, uncomplicated: Secondary | ICD-10-CM | POA: Insufficient documentation

## 2023-06-02 DIAGNOSIS — Z794 Long term (current) use of insulin: Secondary | ICD-10-CM | POA: Diagnosis not present

## 2023-06-02 DIAGNOSIS — R000 Tachycardia, unspecified: Secondary | ICD-10-CM | POA: Diagnosis not present

## 2023-06-02 DIAGNOSIS — D72829 Elevated white blood cell count, unspecified: Secondary | ICD-10-CM | POA: Insufficient documentation

## 2023-06-02 DIAGNOSIS — Z79899 Other long term (current) drug therapy: Secondary | ICD-10-CM | POA: Insufficient documentation

## 2023-06-02 DIAGNOSIS — E1010 Type 1 diabetes mellitus with ketoacidosis without coma: Secondary | ICD-10-CM | POA: Insufficient documentation

## 2023-06-02 DIAGNOSIS — I10 Essential (primary) hypertension: Secondary | ICD-10-CM | POA: Insufficient documentation

## 2023-06-02 DIAGNOSIS — N179 Acute kidney failure, unspecified: Secondary | ICD-10-CM | POA: Diagnosis not present

## 2023-06-02 DIAGNOSIS — R112 Nausea with vomiting, unspecified: Secondary | ICD-10-CM | POA: Diagnosis present

## 2023-06-02 LAB — HEPATIC FUNCTION PANEL
ALT: 21 U/L (ref 0–44)
AST: 19 U/L (ref 15–41)
Albumin: 4.4 g/dL (ref 3.5–5.0)
Alkaline Phosphatase: 54 U/L (ref 38–126)
Bilirubin, Direct: 0.2 mg/dL (ref 0.0–0.2)
Indirect Bilirubin: 1.1 mg/dL — ABNORMAL HIGH (ref 0.3–0.9)
Total Bilirubin: 1.3 mg/dL — ABNORMAL HIGH (ref 0.3–1.2)
Total Protein: 7.7 g/dL (ref 6.5–8.1)

## 2023-06-02 LAB — CBC WITH DIFFERENTIAL/PLATELET
Abs Immature Granulocytes: 0.05 10*3/uL (ref 0.00–0.07)
Basophils Absolute: 0.1 10*3/uL (ref 0.0–0.1)
Basophils Relative: 1 %
Eosinophils Absolute: 0.1 10*3/uL (ref 0.0–0.5)
Eosinophils Relative: 1 %
HCT: 46 % (ref 39.0–52.0)
Hemoglobin: 15.3 g/dL (ref 13.0–17.0)
Immature Granulocytes: 0 %
Lymphocytes Relative: 10 %
Lymphs Abs: 1.2 10*3/uL (ref 0.7–4.0)
MCH: 31.4 pg (ref 26.0–34.0)
MCHC: 33.3 g/dL (ref 30.0–36.0)
MCV: 94.3 fL (ref 80.0–100.0)
Monocytes Absolute: 0.3 10*3/uL (ref 0.1–1.0)
Monocytes Relative: 3 %
Neutro Abs: 10.3 10*3/uL — ABNORMAL HIGH (ref 1.7–7.7)
Neutrophils Relative %: 85 %
Platelets: 371 10*3/uL (ref 150–400)
RBC: 4.88 MIL/uL (ref 4.22–5.81)
RDW: 12.5 % (ref 11.5–15.5)
WBC: 12.1 10*3/uL — ABNORMAL HIGH (ref 4.0–10.5)
nRBC: 0 % (ref 0.0–0.2)

## 2023-06-02 LAB — BASIC METABOLIC PANEL
Anion gap: 12 (ref 5–15)
BUN: 16 mg/dL (ref 6–20)
CO2: 24 mmol/L (ref 22–32)
Calcium: 9.8 mg/dL (ref 8.9–10.3)
Chloride: 98 mmol/L (ref 98–111)
Creatinine, Ser: 1.35 mg/dL — ABNORMAL HIGH (ref 0.61–1.24)
GFR, Estimated: >60 mL/min (ref >60)
Glucose, Bld: 158 mg/dL — ABNORMAL HIGH (ref 70–99)
Potassium: 4.5 mmol/L (ref 3.5–5.1)
Sodium: 134 mmol/L — ABNORMAL LOW (ref 135–145)

## 2023-06-02 LAB — CBG MONITORING, ED
Glucose-Capillary: 173 mg/dL — ABNORMAL HIGH (ref 70–99)
Glucose-Capillary: 188 mg/dL — ABNORMAL HIGH (ref 70–99)
Glucose-Capillary: 216 mg/dL — ABNORMAL HIGH (ref 70–99)
Glucose-Capillary: 264 mg/dL — ABNORMAL HIGH (ref 70–99)
Glucose-Capillary: 175 mg/dL — ABNORMAL HIGH (ref 70–99)
Glucose-Capillary: 145 mg/dL — ABNORMAL HIGH (ref 70–99)

## 2023-06-02 LAB — URINALYSIS, ROUTINE W REFLEX MICROSCOPIC
Bilirubin Urine: NEGATIVE
Glucose, UA: NEGATIVE mg/dL
Hgb urine dipstick: NEGATIVE
Ketones, ur: 20 mg/dL — AB
Leukocytes,Ua: NEGATIVE
Nitrite: NEGATIVE
Protein, ur: >=300 mg/dL — AB
Specific Gravity, Urine: 1.02 (ref 1.005–1.030)
pH: 8 (ref 5.0–8.0)

## 2023-06-02 LAB — I-STAT CHEM 8, ED
BUN: 16 mg/dL (ref 6–20)
Calcium, Ion: 1.13 mmol/L — ABNORMAL LOW (ref 1.15–1.40)
Chloride: 102 mmol/L (ref 98–111)
Creatinine, Ser: 1.4 mg/dL — ABNORMAL HIGH (ref 0.61–1.24)
Glucose, Bld: 152 mg/dL — ABNORMAL HIGH (ref 70–99)
HCT: 46 % (ref 39.0–52.0)
Hemoglobin: 15.6 g/dL (ref 13.0–17.0)
Potassium: 4.5 mmol/L (ref 3.5–5.1)
Sodium: 137 mmol/L (ref 135–145)
TCO2: 26 mmol/L (ref 22–32)

## 2023-06-02 LAB — BETA-HYDROXYBUTYRIC ACID: Beta-Hydroxybutyric Acid: 1.58 mmol/L — ABNORMAL HIGH (ref 0.05–0.27)

## 2023-06-02 LAB — MAGNESIUM: Magnesium: 2.3 mg/dL (ref 1.7–2.4)

## 2023-06-02 LAB — LIPASE, BLOOD: Lipase: 29 U/L (ref 11–51)

## 2023-06-02 MED ORDER — LACTATED RINGERS IV BOLUS
1000.0000 mL | Freq: Once | INTRAVENOUS | Status: AC
  Administered 2023-06-02: 1000 mL via INTRAVENOUS

## 2023-06-02 MED ORDER — ACETAMINOPHEN 325 MG PO TABS: 650.0000 mg | ORAL_TABLET | Freq: Four times a day (QID) | ORAL | Status: DC | PRN

## 2023-06-02 MED ORDER — ALBUTEROL SULFATE (2.5 MG/3ML) 0.083% IN NEBU: 2.5000 mg | INHALATION_SOLUTION | RESPIRATORY_TRACT | Status: DC | PRN

## 2023-06-02 MED ORDER — HYDROMORPHONE HCL 1 MG/ML IJ SOLN: 0.5000 mg | INTRAMUSCULAR | Status: DC | PRN

## 2023-06-02 MED ORDER — LISINOPRIL 10 MG PO TABS: 20.0000 mg | ORAL_TABLET | Freq: Every day | ORAL | Status: DC

## 2023-06-02 MED ORDER — LACTATED RINGERS IV BOLUS
2000.0000 mL | Freq: Once | INTRAVENOUS | Status: AC
  Administered 2023-06-02: 2000 mL via INTRAVENOUS

## 2023-06-02 MED ORDER — INSULIN GLARGINE-YFGN 100 UNIT/ML ~~LOC~~ SOLN
15.0000 [IU] | Freq: Every day | SUBCUTANEOUS | Status: DC
  Filled 2023-06-02: qty 0.15

## 2023-06-02 MED ORDER — INSULIN ASPART 100 UNIT/ML IJ SOLN
0.0000 [IU] | Freq: Every day | INTRAMUSCULAR | Status: DC
  Filled 2023-06-02: qty 0.05

## 2023-06-02 MED ORDER — ONDANSETRON HCL 4 MG/2ML IJ SOLN
4.0000 mg | Freq: Four times a day (QID) | INTRAMUSCULAR | Status: DC | PRN
  Filled 2023-06-02: qty 2

## 2023-06-02 MED ORDER — LORAZEPAM 2 MG/ML IJ SOLN
0.5000 mg | Freq: Once | INTRAMUSCULAR | Status: AC
  Administered 2023-06-02: 0.5 mg via INTRAVENOUS
  Filled 2023-06-02: qty 1

## 2023-06-02 MED ORDER — PANTOPRAZOLE SODIUM 40 MG IV SOLR
40.0000 mg | Freq: Every day | INTRAVENOUS | Status: DC
  Administered 2023-06-02: 40 mg via INTRAVENOUS
  Filled 2023-06-02: qty 10

## 2023-06-02 MED ORDER — SODIUM CHLORIDE 0.9 % IV SOLN
12.5000 mg | Freq: Once | INTRAVENOUS | Status: AC
  Administered 2023-06-02: 12.5 mg via INTRAVENOUS
  Filled 2023-06-02: qty 12.5

## 2023-06-02 MED ORDER — CARVEDILOL 3.125 MG PO TABS
3.1250 mg | ORAL_TABLET | Freq: Two times a day (BID) | ORAL | Status: DC
  Filled 2023-06-02: qty 1

## 2023-06-02 MED ORDER — ONDANSETRON HCL 4 MG PO TABS: 4.0000 mg | ORAL_TABLET | Freq: Four times a day (QID) | ORAL | Status: DC | PRN

## 2023-06-02 MED ORDER — DROPERIDOL 2.5 MG/ML IJ SOLN
1.2500 mg | Freq: Once | INTRAMUSCULAR | Status: AC
  Administered 2023-06-02: 1.25 mg via INTRAVENOUS
  Filled 2023-06-02: qty 2

## 2023-06-02 MED ORDER — SODIUM CHLORIDE 0.9 % IV SOLN: INTRAVENOUS | Status: DC

## 2023-06-02 MED ORDER — METOCLOPRAMIDE HCL 5 MG/ML IJ SOLN
20.0000 mg | Freq: Four times a day (QID) | INTRAVENOUS | Status: DC | PRN
  Administered 2023-06-02: 20 mg via INTRAVENOUS
  Filled 2023-06-02: qty 4

## 2023-06-02 MED ORDER — ACETAMINOPHEN 650 MG RE SUPP: 650.0000 mg | Freq: Four times a day (QID) | RECTAL | Status: DC | PRN

## 2023-06-02 MED ORDER — METOCLOPRAMIDE HCL 5 MG/ML IJ SOLN
10.0000 mg | Freq: Once | INTRAMUSCULAR | Status: AC
  Administered 2023-06-02: 10 mg via INTRAVENOUS
  Filled 2023-06-02: qty 2

## 2023-06-02 MED ORDER — INSULIN ASPART 100 UNIT/ML IJ SOLN
0.0000 [IU] | Freq: Three times a day (TID) | INTRAMUSCULAR | Status: DC
  Administered 2023-06-02: 3 [IU] via SUBCUTANEOUS
  Administered 2023-06-02: 8 [IU] via SUBCUTANEOUS
  Filled 2023-06-02: qty 0.15

## 2023-06-02 NOTE — ED Provider Notes (Signed)
[place] EMERGENCY DEPARTMENT AT [facility] Provider Note   CSN: [id number] Arrival date & time: 06/02/23  7035     History  Chief Complaint  Patient presents with   Emesis    Zachary Vazquez is a 37 y.o. male.  HPI Patient for nausea and vomiting.  This is a recurrent issue for him.  He states that earlier today, he felt unwell.  He does not further describe the symptoms that he was having this morning.  Over the past 8 hours, patient has had nausea, vomiting, and p.o. intolerance.  He has Zofran and Reglan at home.  He attempted to take these but was not able to tolerate any medications.  He denies any abdominal pain, chest pain, or shortness of breath.  He states that his sugars have been well-controlled.    Home Medications Prior to Admission medications   Medication Sig Start Date End Date Taking? Authorizing Provider  acetaminophen (TYLENOL) 325 MG tablet Take 2 tablets (650 mg total) by mouth every 6 (six) hours as needed for mild pain (or Fever >/= 101). 04/08/23  Yes Vonna Drafts, MD  carvedilol (COREG) 3.125 MG tablet Take 1 tablet (3.125 mg total) by mouth 2 (two) times daily. 05/20/23  Yes Shah, Pratik D, DO  docusate sodium (COLACE) 100 MG capsule Take 1 capsule (100 mg total) by mouth 2 (two) times daily. 04/14/23  Yes Ivonne Andrew, NP  insulin aspart (NOVOLOG) 100 UNIT/ML injection Use as directed per sliding scale Patient taking differently: Inject 0-10 Units into the skin as directed. Sliding Scale 07/15/22  Yes Ivonne Andrew, NP  insulin glargine (LANTUS) 100 UNIT/ML injection Inject 0.28 mLs (28 Units total) into the skin at bedtime. 04/14/23  Yes Ivonne Andrew, NP  lisinopril (ZESTRIL) 20 MG tablet Take 1 tablet (20 mg total) by mouth daily. 05/21/23  Yes Shah, Pratik D, DO  metoCLOPramide (REGLAN) 10 MG tablet Take 1 tablet (10 mg total) by mouth every 6 (six) hours. 05/20/23  Yes Sherryll Burger, Pratik D, DO  ondansetron (ZOFRAN-ODT) 4 MG  disintegrating tablet Take 1 tablet (4 mg total) by mouth every 8 (eight) hours as needed for nausea or vomiting. 05/20/23  Yes Sherryll Burger, Pratik D, DO  Continuous Blood Gluc Receiver (FREESTYLE LIBRE 2 READER) DEVI 1 application. by Does not apply route every 14 (fourteen) days. Patient not taking: Reported on 04/28/2023 01/10/22   Barbette Merino, NP  Continuous Blood Gluc Sensor (FREESTYLE LIBRE 2 SENSOR) MISC Use to check glucose Patient not taking: Reported on 04/28/2023 08/21/22   Ivonne Andrew, NP  pantoprazole (PROTONIX) 40 MG tablet Take 1 tablet (40 mg total) by mouth 2 (two) times daily before a meal for 15 days. Patient not taking: Reported on 05/17/2023 04/25/23 05/17/23  Glade Lloyd, MD  polyethylene glycol powder (GLYCOLAX/MIRALAX) 17 GM/SCOOP powder Mix 1capful (17 g) with 4-8 ounces with water/juice by mouth 2 (two) times daily as needed. Patient not taking: Reported on 05/16/2023 04/14/23   Ivonne Andrew, NP  Probiotic Product [facility]) CAPS Take 1 capsule by mouth daily. Patient not taking: Reported on 05/16/2023 04/14/23   Ivonne Andrew, NP      Allergies    Clindamycin/lincomycin and Bactrim [sulfamethoxazole-trimethoprim]    Review of Systems   Review of Systems  Gastrointestinal:  Positive for nausea and vomiting.  All other systems reviewed and are negative.   Physical Exam Updated Vital Signs BP (!) 136/90   Pulse (!) 106  Temp 98.3 F (36.8 C) (Oral)   Resp 10   Ht 5\' 11"  (1.803 m)   Wt 73.9 kg   SpO2 95%   BMI 22.73 kg/m  Physical Exam Vitals and nursing note reviewed.  Constitutional:      General: He is not in acute distress.    Appearance: Normal appearance. He is well-developed. He is not ill-appearing, toxic-appearing or diaphoretic.  HENT:     Head: Normocephalic and atraumatic.     Right Ear: External ear normal.     Left Ear: External ear normal.     Nose: Nose normal.     Mouth/Throat:     Mouth: Mucous membranes are moist.  Eyes:      Extraocular Movements: Extraocular movements intact.     Conjunctiva/sclera: Conjunctivae normal.  Cardiovascular:     Rate and Rhythm: Regular rhythm. Tachycardia present.  Pulmonary:     Effort: Pulmonary effort is normal. No respiratory distress.  Abdominal:     General: There is no distension.     Palpations: Abdomen is soft.     Tenderness: There is no abdominal tenderness.  Musculoskeletal:        General: No swelling. Normal range of motion.     Cervical back: Normal range of motion and neck supple.  Skin:    General: Skin is warm and dry.     Coloration: Skin is not jaundiced or pale.  Neurological:     General: No focal deficit present.     Mental Status: He is alert and oriented to person, place, and time.  Psychiatric:        Mood and Affect: Mood normal.        Behavior: Behavior normal.     ED Results / Procedures / Treatments   Labs (all labs ordered are listed, but only abnormal results are displayed) Labs Reviewed  BASIC METABOLIC PANEL - Abnormal; Notable for the following components:      Result Value   Sodium 134 (*)    Glucose, Bld 158 (*)    Creatinine, Ser 1.35 (*)    All other components within normal limits  HEPATIC FUNCTION PANEL - Abnormal; Notable for the following components:   Total Bilirubin 1.3 (*)    Indirect Bilirubin 1.1 (*)    All other components within normal limits  CBC WITH DIFFERENTIAL/PLATELET - Abnormal; Notable for the following components:   WBC 12.1 (*)    Neutro Abs 10.3 (*)    All other components within normal limits  URINALYSIS, ROUTINE W REFLEX MICROSCOPIC - Abnormal; Notable for the following components:   Ketones, ur 20 (*)    Protein, ur >=300 (*)    Bacteria, UA RARE (*)    All other components within normal limits  BETA-HYDROXYBUTYRIC ACID - Abnormal; Notable for the following components:   Beta-Hydroxybutyric Acid 1.58 (*)    All other components within normal limits  CBG MONITORING, ED - Abnormal; Notable for  the following components:   Glucose-Capillary 173 (*)    All other components within normal limits  I-STAT CHEM 8, ED - Abnormal; Notable for the following components:   Creatinine, Ser 1.40 (*)    Glucose, Bld 152 (*)    Calcium, Ion 1.13 (*)    All other components within normal limits  CBG MONITORING, ED - Abnormal; Notable for the following components:   Glucose-Capillary 188 (*)    All other components within normal limits  LIPASE, BLOOD  MAGNESIUM    EKG  EKG Interpretation Date/Time:  Monday June 02 2023 01:19:48 EDT Ventricular Rate:  100 PR Interval:  142 QRS Duration:  90 QT Interval:  341 QTC Calculation: 440 R Axis:   29  Text Interpretation: Sinus tachycardia Probable anteroseptal infarct, old Confirmed by Gloris Manchester [phone]) on 06/02/2023 2:46:46 AM  Radiology No results found.  Procedures Procedures    Medications Ordered in ED Medications  lactated ringers bolus 2,000 mL (0 mLs Intravenous Stopped 06/02/23 0537)  metoCLOPramide (REGLAN) injection 10 mg (10 mg Intravenous Given 06/02/23 0142)  promethazine (PHENERGAN) 12.5 mg in sodium chloride 0.9 % 50 mL IVPB (0 mg Intravenous Stopped 06/02/23 0537)  lactated ringers bolus 1,000 mL (1,000 mLs Intravenous New Bag/Given 06/02/23 0537)  droperidol (INAPSINE) 2.5 MG/ML injection 1.25 mg (1.25 mg Intravenous Given 06/02/23 0534)  LORazepam (ATIVAN) injection 0.5 mg (0.5 mg Intravenous Given 06/02/23 0535)    ED Course/ Medical Decision Making/ A&P                                 Medical Decision Making Amount and/or Complexity of Data Reviewed Labs: ordered.  Risk Prescription drug management.   This patient presents to the ED for concern of nausea and vomiting, this involves an extensive number of treatment options, and is a complaint that carries with it a high risk of complications and morbidity.  The differential diagnosis includes cyclic vomiting syndrome, DKA, dehydration, metabolic derangements, enteritis,  GERD, SBO   Co morbidities that complicate the patient evaluation  T1DM, THC dependence   Additional history obtained:  Additional history obtained from N/A External records from outside source obtained and reviewed including EMR   Lab Tests:  I Ordered, and personally interpreted labs.  The pertinent results include: Leukocytosis is present, likely stress to margination from vomiting today; creatinine is increased from baseline; although glucose is normal, he does have slight increase in beta hydroxybutyrate.   Cardiac Monitoring: / EKG:  The patient was maintained on a cardiac monitor.  I personally viewed and interpreted the cardiac monitored which showed an underlying rhythm of: Sinus rhythm   Problem List / ED Course / Critical interventions / Medication management  Patient presenting for nausea and vomiting.  He has had multiple ED visits for the same in the past.  He does seem that he has some sort of cyclic vomiting syndrome.  Symptoms today started 8 hours prior to arrival.  He is a type I diabetic and has had DKA in the past.  Initial sugar was reassuring in the 170s.  Given his history, will check further lab work.  2 L of IV fluid was ordered for hydration.  Reglan was ordered for nausea.  Patient labwork is notable for a increase in creatinine from baseline.  Bicarb is normal, however, he does have slightly increased beta-hydroxybutyrate.  After IV fluids and Reglan, patient endorsed ongoing nausea.  Phenergan was ordered.  On further reassessment, patient has ongoing nausea.  Droperidol and Ativan were ordered.  On further reassessment, patient reported that her nausea had improved by 50%.  He was still unable to tolerate water.  He was given ice chips.  On further reassessment, patient unable to tolerate ice chips.  Patient to be admitted for intractable nausea and vomiting. I ordered medication including IV fluids for hydration; Reglan, Phenergan, droperidol, Ativan for  nausea Reevaluation of the patient after these medicines showed that the patient improved I have reviewed the  patients home medicines and have made adjustments as needed   Social Determinants of Health:  Frequent hospitalizations         Final Clinical Impression(s) / ED Diagnoses Final diagnoses:  Intractable nausea and vomiting    Rx / DC Orders ED Discharge Orders     None         Gloris Manchester, MD 06/02/23 [phone]

## 2023-06-02 NOTE — ED Triage Notes (Signed)
Pt states he woke feeling off today. Pt states that he has been vomiting for the past 8 hours with no relief from his home nausea medication. Denies chest pain or shortness of breath.

## 2023-06-02 NOTE — H&P (Addendum)
History and Physical  Zachary Vazquez ZOX:[id number] DOB: [birth year] DOA: 06/02/2023  PCP: Ivonne Andrew, NP   Chief Complaint: Intractable vomiting  HPI: Zachary Vazquez is a 37 y.o. male with medical history significant for type 1 diabetes, prior hospital admission for DKA, cannabinoid hyperemesis syndrome, being admitted to the hospital with intractable nausea and vomiting.  Says that he was feeling fine until yesterday, when he started having nausea and vomiting, without fevers or chills, no abdominal pain.  He has had intermittent bowel movements since yesterday, but states they are not normal.  He has had some loose bowel movements, but intermittently some very hard small stools as well.  Noticed this morning that his emesis looks a little bit dark red, but has not seen any frank blood.  Also this morning while in the ER, he started having pain on the right side of his back, which she has never had before.  Since arrival in the emergency department, he has been hypertensive and tachycardic.  Denies pleuritic chest pain.  Lab work with creatinine increased to 1.40, beta hydroxybutyrate mildly elevated at 1.6 but otherwise largely unremarkable.  He was given 3 L of LR, as well as multiple rounds of pain and nausea medications.  Unable to tolerate even ice chips, so hospitalist was contacted for admission.  Review of Systems: Please see HPI for pertinent positives and negatives. A complete 10 system review of systems are otherwise negative.  Past Medical History:  Diagnosis Date   Cannabis hyperemesis syndrome concurrent with and due to cannabis abuse (HCC)    History of delayed wound healing 10/2019   Tetrahydrocannabinol (THC) use disorder, severe, dependence (HCC)    Type 1 diabetes mellitus (HCC)    Vitamin D deficiency 09/2020   Past Surgical History:  Procedure Laterality Date   TONSILLECTOMY      Social History:  reports that he has been smoking cigarettes. He has a 15  pack-year smoking history. He has never used smokeless tobacco. He reports current alcohol use. He reports current drug use. Drug: Marijuana.   Allergies  Allergen Reactions   Clindamycin/Lincomycin Anaphylaxis   Bactrim [Sulfamethoxazole-Trimethoprim] Swelling and Other (See Comments)    Eyes swell and bumps appear on/near the eyes     Family History  Problem Relation Age of Onset   Hypertension Mother    Diabetes Mellitus I Brother      Prior to Admission medications   Medication Sig Start Date End Date Taking? Authorizing Provider  acetaminophen (TYLENOL) 325 MG tablet Take 2 tablets (650 mg total) by mouth every 6 (six) hours as needed for mild pain (or Fever >/= 101). 04/08/23  Yes Vonna Drafts, MD  carvedilol (COREG) 3.125 MG tablet Take 1 tablet (3.125 mg total) by mouth 2 (two) times daily. 05/20/23  Yes Shah, Pratik D, DO  docusate sodium (COLACE) 100 MG capsule Take 1 capsule (100 mg total) by mouth 2 (two) times daily. 04/14/23  Yes Ivonne Andrew, NP  insulin aspart (NOVOLOG) 100 UNIT/ML injection Use as directed per sliding scale Patient taking differently: Inject 0-10 Units into the skin as directed. Sliding Scale 07/15/22  Yes Ivonne Andrew, NP  insulin glargine (LANTUS) 100 UNIT/ML injection Inject 0.28 mLs (28 Units total) into the skin at bedtime. 04/14/23  Yes Ivonne Andrew, NP  lisinopril (ZESTRIL) 20 MG tablet Take 1 tablet (20 mg total) by mouth daily. 05/21/23  Yes Shah, Pratik D, DO  metoCLOPramide (REGLAN) 10 MG tablet Take 1  tablet (10 mg total) by mouth every 6 (six) hours. 05/20/23  Yes Sherryll Burger, Pratik D, DO  ondansetron (ZOFRAN-ODT) 4 MG disintegrating tablet Take 1 tablet (4 mg total) by mouth every 8 (eight) hours as needed for nausea or vomiting. 05/20/23  Yes Sherryll Burger, Pratik D, DO  Continuous Blood Gluc Receiver (FREESTYLE LIBRE 2 READER) DEVI 1 application. by Does not apply route every 14 (fourteen) days. Patient not taking: Reported on 04/28/2023 01/10/22    Barbette Merino, NP  Continuous Blood Gluc Sensor (FREESTYLE LIBRE 2 SENSOR) MISC Use to check glucose Patient not taking: Reported on 04/28/2023 08/21/22   Ivonne Andrew, NP  pantoprazole (PROTONIX) 40 MG tablet Take 1 tablet (40 mg total) by mouth 2 (two) times daily before a meal for 15 days. Patient not taking: Reported on 05/17/2023 04/25/23 05/17/23  Glade Lloyd, MD  polyethylene glycol powder (GLYCOLAX/MIRALAX) 17 GM/SCOOP powder Mix 1capful (17 g) with 4-8 ounces with water/juice by mouth 2 (two) times daily as needed. Patient not taking: Reported on 05/16/2023 04/14/23   Ivonne Andrew, NP  Probiotic Product [facility]) CAPS Take 1 capsule by mouth daily. Patient not taking: Reported on 05/16/2023 04/14/23   Ivonne Andrew, NP    Physical Exam: BP (!) 136/90   Pulse (!) 106   Temp 98.3 F (36.8 C) (Oral)   Resp 10   Ht 5\' 11"  (1.803 m)   Wt 73.9 kg   SpO2 95%   BMI 22.73 kg/m   General:  Alert, oriented, calm, not in acute distress, but looks uncomfortable. Eyes: EOMI, clear conjuctivae, white sclerea Neck: supple, no masses, trachea mildline  Cardiovascular: Tachycardic and regular, no murmurs or rubs, no peripheral edema  Respiratory: clear to auscultation bilaterally, no wheezes, no crackles, no bruise or injury seen, but he is clearly tender to palpation over the right lateral chest.  No particular bony tenderness over the ribs, however soft tissue is quite tender Abdomen: soft, nontender, nondistended, normal bowel tones heard  Skin: dry, no rashes  Musculoskeletal: no joint effusions, normal range of motion  Psychiatric: appropriate affect, normal speech  Neurologic: extraocular muscles intact, clear speech, moving all extremities with intact sensorium          Labs on Admission:  Basic Metabolic Panel: Recent Labs  Lab 06/02/23 0126 06/02/23 0139  NA 134* 137  K 4.5 4.5  CL 98 102  CO2 24  --   GLUCOSE 158* 152*  BUN 16 16  CREATININE 1.35* 1.40*   CALCIUM 9.8  --   MG 2.3  --    Liver Function Tests: Recent Labs  Lab 06/02/23 0126  AST 19  ALT 21  ALKPHOS 54  BILITOT 1.3*  PROT 7.7  ALBUMIN 4.4   Recent Labs  Lab 06/02/23 0126  LIPASE 29   No results for input(s): "AMMONIA" in the last 168 hours. CBC: Recent Labs  Lab 06/02/23 0126 06/02/23 0139  WBC 12.1*  --   NEUTROABS 10.3*  --   HGB 15.3 15.6  HCT 46.0 46.0  MCV 94.3  --   PLT 371  --    Cardiac Enzymes: No results for input(s): "CKTOTAL", "CKMB", "CKMBINDEX", "TROPONINI" in the last 168 hours.  BNP (last 3 results) No results for input(s): "BNP" in the last 8760 hours.  ProBNP (last 3 results) No results for input(s): "PROBNP" in the last 8760 hours.  CBG: Recent Labs  Lab 06/02/23 0111 06/02/23 0358  GLUCAP 173* 188*    Radiological  Exams on Admission: No results found.  Assessment/Plan Zachary Vazquez is a 37 y.o. male with medical history significant for type 1 diabetes, prior hospital admission for DKA, cannabinoid hyperemesis syndrome, being admitted to the hospital with intractable nausea and vomiting.  Intractable nausea and vomiting-no obvious evidence of obstruction, acute infection May be due to cyclic vomiting from cannabinoid hyperemesis, or perhaps gastroparesis. -Observation admission -Continue IV fluids -IV Dilaudid as needed for pain -IV Zofran as needed for nausea, IV Reglan for intractable nausea and vomiting -Will obtain abdominal x-ray to rule out any evidence of obstruction or other acute process in the abdomen, though not highly suspected -N.p.o. for now with ice chips -IV PPI daily  Type 1 diabetes-well-controlled, with hemoglobin A1c 6.4 04/14/2023 -Continue home basal insulin at reduced dose nightly -Moderate dose sliding scale  AKI-in the setting of normal renal function, likely ATN from relative dehydration. -Avoid nephrotoxins (hold lisinopril) -Trend renal function with daily labs  Dark  emesis-patient is concerned there might be some hematemesis, though looks just dark brown to me.  He is hemodynamically stable, not anemic. -IV PPI -Monitor closely -Hemoccult emesis -Avoid chemical DVT prophylaxis for now  Leukocytosis-mild, without fever, likely reactive due to recent vomiting  Hypertension-continue Coreg  Tachycardia-likely due to relative dehydration, ongoing vomiting/discomfort, and possibly withdrawing from his beta-blocker.  Treating as above.  DVT prophylaxis: SCDs only    Code Status: Full Code  Consults called: None  Admission status: Observation  Time spent: 50 minutes   Sharlette Dense MD [facility] Pager [phone]  If 7PM-7AM, please contact night-coverage [email or web address] Password [facility]  06/02/2023, 7:27 AM

## 2023-06-02 NOTE — Discharge Summary (Signed)
Discharge Summary  Zachary Vazquez UEA:[id number] DOB: 06/27/1986  PCP: Zachary Andrew, NP  Admit date: 06/02/2023 Discharge date: 06/02/2023   Recommendations for Outpatient Follow-up:  Please follow up with your PCP with CBC and BMP in 1-2 weeks  Discharge Diagnoses:  Active Hospital Problems   Diagnosis Date Noted   Intractable vomiting with nausea 06/02/2023    Resolved Hospital Problems  No resolved problems to display.   Discharge Condition: Stable   Diet recommendation: Diet Orders (From admission, onward)     Start     Ordered   06/02/23 1642  Diet full liquid Room service appropriate? Yes; Fluid consistency: Thin  Diet effective now       Question Answer Comment  Room service appropriate? Yes   Fluid consistency: Thin      06/02/23 1641           HPI and Brief Hospital Course:  Zachary Vazquez is a 37 y.o. male with medical history significant for type 1 diabetes, prior hospital admission for DKA, cannabinoid hyperemesis syndrome, being admitted to the hospital with intractable nausea and vomiting.  Labs showed mild AKI, chest x-ray was unremarkable.  He was admitted to the hospital with supportive care, IV fluids, pain and nausea medication as needed.  This afternoon, his nausea and abdominal pain have improved.  He wants to go home.  His vital signs have improved, his tachycardia is resolved.  He is hemodynamically stable with slightly elevated blood pressure, improved tachycardia, afebrile.  He has tolerated liquid p.o. intake, and wants to go home.    He will be discharged home in stable condition.  Lisinopril was continued at the time of discharge, due to AKI.  Patient was instructed to resume lisinopril as an outpatient once he follows up with PCP.  Discharge Exam: BP (!) 150/94   Pulse 91   Temp 97.6 F (36.4 C) (Oral)   Resp 11   Ht 5\' 11"  (1.803 m)   Wt 73.9 kg   SpO2 96%   BMI 22.73 kg/m  See admission physical exam  Discharge  Instructions You were cared for by a hospitalist during your hospital stay. If you have any questions about your discharge medications or the care you received while you were in the hospital after you are discharged, you can call the unit and asked to speak with the hospitalist on call if the hospitalist that took care of you is not available. Once you are discharged, your primary care physician will handle any further medical issues. Please note that NO REFILLS for any discharge medications will be authorized once you are discharged, as it is imperative that you return to your primary care physician (or establish a relationship with a primary care physician if you do not have one) for your aftercare needs so that they can reassess your need for medications and monitor your lab values.   Allergies as of 06/02/2023       Reactions   Clindamycin/lincomycin Anaphylaxis   Bactrim [sulfamethoxazole-trimethoprim] Swelling, Other (See Comments)   Eyes swell and bumps appear on/near the eyes         Medication List     STOP taking these medications    lisinopril 20 MG tablet Commonly known as: ZESTRIL       TAKE these medications    acetaminophen 325 MG tablet Commonly known as: TYLENOL Take 2 tablets (650 mg total) by mouth every 6 (six) hours as needed for mild pain (or Fever >/=  101).   carvedilol 3.125 MG tablet Commonly known as: COREG Take 1 tablet (3.125 mg total) by mouth 2 (two) times daily.   docusate sodium 100 MG capsule Commonly known as: Colace Take 1 capsule (100 mg total) by mouth 2 (two) times daily.   FreeStyle Foley 2 Reader [place] 1 application. by Does not apply route every 14 (fourteen) days.   FreeStyle Libre 2 Sensor Misc Use to check glucose   Lantus 100 UNIT/ML injection Generic drug: insulin glargine Inject 0.28 mLs (28 Units total) into the skin at bedtime.   metoCLOPramide 10 MG tablet Commonly known as: REGLAN Take 1 tablet (10 mg total) by mouth  every 6 (six) hours.   NovoLOG 100 UNIT/ML injection Generic drug: insulin aspart Use as directed per sliding scale What changed:  how much to take how to take this when to take this additional instructions   ondansetron 4 MG disintegrating tablet Commonly known as: ZOFRAN-ODT Take 1 tablet (4 mg total) by mouth every 8 (eight) hours as needed for nausea or vomiting.   pantoprazole 40 MG tablet Commonly known as: Protonix Take 1 tablet (40 mg total) by mouth 2 (two) times daily before a meal for 15 days.   polyethylene glycol powder 17 GM/SCOOP powder Commonly known as: GLYCOLAX/MIRALAX Mix 1capful (17 g) with 4-8 ounces with water/juice by mouth 2 (two) times daily as needed.   Restora Caps Take 1 capsule by mouth daily.       Allergies  Allergen Reactions   Clindamycin/Lincomycin Anaphylaxis   Bactrim [Sulfamethoxazole-Trimethoprim] Swelling and Other (See Comments)    Eyes swell and bumps appear on/near the eyes      The results of significant diagnostics from this hospitalization (including imaging, microbiology, ancillary and laboratory) are listed below for reference.    Significant Diagnostic Studies: DG Abd 1 View  Result Date: 06/02/2023 CLINICAL DATA:  Nausea, vomiting EXAM: ABDOMEN - 1 VIEW COMPARISON:  04/05/2023 FINDINGS: Bowel gas pattern is nonspecific. No abnormal masses or calcifications are seen. Kidneys are partly obscured by bowel contents. Visualized bony structures are unremarkable. IMPRESSION: Nonspecific bowel gas pattern. Electronically Signed   By: Ernie Avena M.D.   On: 06/02/2023 07:50   DG Chest Portable 1 View  Result Date: 05/17/2023 CLINICAL DATA:  Hematemesis. EXAM: PORTABLE CHEST 1 VIEW COMPARISON:  04/24/2023 FINDINGS: The lungs are clear without focal pneumonia, edema, pneumothorax or pleural effusion. The cardiopericardial silhouette is within normal limits for size. No acute bony abnormality. Telemetry leads overlie the chest.  IMPRESSION: No active disease. Electronically Signed   By: Kennith Center M.D.   On: 05/17/2023 11:27    Microbiology: No results found for this or any previous visit (from the past 240 hour(s)).   Labs: Basic Metabolic Panel: Recent Labs  Lab 06/02/23 0126 06/02/23 0139  NA 134* 137  K 4.5 4.5  CL 98 102  CO2 24  --   GLUCOSE 158* 152*  BUN 16 16  CREATININE 1.35* 1.40*  CALCIUM 9.8  --   MG 2.3  --    Liver Function Tests: Recent Labs  Lab 06/02/23 0126  AST 19  ALT 21  ALKPHOS 54  BILITOT 1.3*  PROT 7.7  ALBUMIN 4.4   Recent Labs  Lab 06/02/23 0126  LIPASE 29   No results for input(s): "AMMONIA" in the last 168 hours. CBC: Recent Labs  Lab 06/02/23 0126 06/02/23 0139  WBC 12.1*  --   NEUTROABS 10.3*  --   HGB 15.3  15.6  HCT 46.0 46.0  MCV 94.3  --   PLT 371  --    Cardiac Enzymes: No results for input(s): "CKTOTAL", "CKMB", "CKMBINDEX", "TROPONINI" in the last 168 hours. BNP: BNP (last 3 results) No results for input(s): "BNP" in the last 8760 hours.  ProBNP (last 3 results) No results for input(s): "PROBNP" in the last 8760 hours.  CBG: Recent Labs  Lab 06/02/23 0358 06/02/23 0741 06/02/23 0909 06/02/23 1222 06/02/23 1634  GLUCAP 188* 216* 264* 175* 145*    Time spent: < 30 minutes were spent in preparing this discharge including medication reconciliation, counseling, and coordination of care.  Signed:   Sharlette Dense, MD  [facility] 06/02/2023, 5:32 PM

## 2023-06-03 ENCOUNTER — Telehealth: Payer: Self-pay

## 2023-06-03 NOTE — Transitions of Care (Post Inpatient/ED Visit) (Signed)
   06/03/2023  Name: Zachary Vazquez MRN: [id number] DOB: November 19, 1985  Today's TOC FU Call Status: Today's TOC FU Call Status:: Successful TOC FU Call Completed TOC FU Call Complete Date: 06/03/23  Transition Care Management Follow-up Telephone Call Date of Discharge: 06/02/23 Discharge Facility: [facility]) Type of Discharge: Emergency Department How have you been since you were released from the hospital?: Better Any questions or concerns?: No  Items Reviewed: Did you receive and understand the discharge instructions provided?: Yes Medications obtained,verified, and reconciled?: Yes (Medications Reviewed) Any new allergies since your discharge?: No Dietary orders reviewed?: Yes Do you have support at home?: Yes People in Home: parent(s)  Medications Reviewed Today: Medications Reviewed Today   Medications were not reviewed in this encounter     Home Care and Equipment/Supplies: Were Home Health Services Ordered?: No Any new equipment or medical supplies ordered?: No  Functional Questionnaire: Do you need assistance with bathing/showering or dressing?: No Do you need assistance with meal preparation?: No Do you need assistance with eating?: No Do you have difficulty maintaining continence: No Do you need assistance with getting out of bed/getting out of a chair/moving?: No Do you have difficulty managing or taking your medications?: No  Follow up appointments reviewed: PCP Follow-up appointment confirmed?: Yes Date of PCP follow-up appointment?: 06/04/23 Specialist Hospital Follow-up appointment confirmed?: No Do you need transportation to your follow-up appointment?: No Do you understand care options if your condition(s) worsen?: Yes-patient verbalized understanding    SIGNATURE TB,CMA

## 2023-06-04 ENCOUNTER — Ambulatory Visit: Payer: Medicaid Other | Admitting: Nurse Practitioner

## 2023-06-04 ENCOUNTER — Encounter: Payer: Self-pay | Admitting: Nurse Practitioner

## 2023-06-04 VITALS — BP 150/108 | HR 100 | Temp 98.1°F | Resp 14 | Ht 71.0 in | Wt 160.0 lb

## 2023-06-04 DIAGNOSIS — E109 Type 1 diabetes mellitus without complications: Secondary | ICD-10-CM

## 2023-06-04 DIAGNOSIS — R1110 Vomiting, unspecified: Secondary | ICD-10-CM | POA: Diagnosis not present

## 2023-06-04 DIAGNOSIS — I152 Hypertension secondary to endocrine disorders: Secondary | ICD-10-CM

## 2023-06-04 NOTE — Progress Notes (Signed)
Pt is here for ED f/u   Complaining of nausea

## 2023-06-04 NOTE — Progress Notes (Signed)
@Patient  ID: Zachary Vazquez, male    DOB: September 10, 1986, 37 y.o.   MRN: [id number]  Chief Complaint  Patient presents with   Hospitalization Follow-up    Referring provider: Ivonne Andrew, NP   HPI  37 year old male with history of type 1 diabetes   Patient presents today for ED follow-up.  He was admitted to the ED on 04/24/2023 for acute kidney injury related to uncontrolled diabetes, nausea, vomiting.  He returned to the ED on 06/02/2023 again with nausea and vomiting.  Patient refused gastric emptying study during hospital admission.  He does need a referral to GI.  Vomiting could also be due to marijuana abuse.  We will place urgent referral today.  We will place urgent referral back to endocrinology.  Referrals have been placed multiple times for this patient.  He states that he does not go to his appointments due to cost.  He now has Medicaid.  We will call to get an appointment scheduled.  Patient states that he is not taking blood pressure medications.  He states that he was told in the ED to hold lisinopril.  We will check labs today.  Patient is not taking Reglan or Zofran as directed.  He states that he does take insulins as directed.  Will consult with pharmacist for medication management with hypertension.  Denies f/c/s, n/v/d, hemoptysis, PND, leg swelling Denies chest pain or edema     Allergies  Allergen Reactions   Clindamycin/Lincomycin Anaphylaxis   Bactrim [Sulfamethoxazole-Trimethoprim] Swelling and Other (See Comments)    Eyes swell and bumps appear on/near the eyes     Immunization History  Administered Date(s) Administered   Tdap 12/15/2019    Past Medical History:  Diagnosis Date   Cannabis hyperemesis syndrome concurrent with and due to cannabis abuse (HCC)    History of delayed wound healing 10/2019   Tetrahydrocannabinol (THC) use disorder, severe, dependence (HCC)    Type 1 diabetes mellitus (HCC)    Vitamin D deficiency 09/2020    Tobacco  History: Social History   Tobacco Use  Smoking Status Every Day   Current packs/day: 1.00   Average packs/day: 1 pack/day for 15.0 years (15.0 ttl pk-yrs)   Types: Cigarettes  Smokeless Tobacco Never   Ready to quit: Not Answered Counseling given: Not Answered   Outpatient Encounter Medications as of 06/04/2023  Medication Sig   acetaminophen (TYLENOL) 325 MG tablet Take 2 tablets (650 mg total) by mouth every 6 (six) hours as needed for mild pain (or Fever >/= 101).   carvedilol (COREG) 3.125 MG tablet Take 1 tablet (3.125 mg total) by mouth 2 (two) times daily.   docusate sodium (COLACE) 100 MG capsule Take 1 capsule (100 mg total) by mouth 2 (two) times daily.   insulin aspart (NOVOLOG) 100 UNIT/ML injection Use as directed per sliding scale (Patient taking differently: Inject 0-10 Units into the skin as directed. Sliding Scale)   insulin glargine (LANTUS) 100 UNIT/ML injection Inject 0.28 mLs (28 Units total) into the skin at bedtime.   metoCLOPramide (REGLAN) 10 MG tablet Take 1 tablet (10 mg total) by mouth every 6 (six) hours.   ondansetron (ZOFRAN-ODT) 4 MG disintegrating tablet Take 1 tablet (4 mg total) by mouth every 8 (eight) hours as needed for nausea or vomiting.   Continuous Blood Gluc Receiver (FREESTYLE LIBRE 2 READER) [place] 1 application. by Does not apply route every 14 (fourteen) days. (Patient not taking: Reported on 04/28/2023)   Continuous Blood Gluc Sensor (  FREESTYLE LIBRE 2 SENSOR) MISC Use to check glucose (Patient not taking: Reported on 04/28/2023)   pantoprazole (PROTONIX) 40 MG tablet Take 1 tablet (40 mg total) by mouth 2 (two) times daily before a meal for 15 days. (Patient not taking: Reported on 05/17/2023)   polyethylene glycol powder (GLYCOLAX/MIRALAX) 17 GM/SCOOP powder Mix 1capful (17 g) with 4-8 ounces with water/juice by mouth 2 (two) times daily as needed. (Patient not taking: Reported on 05/16/2023)   Probiotic Product (RESTORA) CAPS Take 1 capsule by mouth  daily. (Patient not taking: Reported on 05/16/2023)   No facility-administered encounter medications on file as of 06/04/2023.     Review of Systems  Review of Systems  Constitutional: Negative.   HENT: Negative.    Cardiovascular: Negative.   Gastrointestinal: Negative.   Allergic/Immunologic: Negative.   Neurological: Negative.   Psychiatric/Behavioral: Negative.         Physical Exam  BP (!) 150/108 (BP Location: Right Arm, Patient Position: Sitting, Cuff Size: Normal)   Pulse 100   Temp 98.1 F (36.7 C)   Resp 14   Ht 5\' 11"  (1.803 m)   Wt 160 lb (72.6 kg)   SpO2 99%   BMI 22.32 kg/m   Wt Readings from Last 5 Encounters:  06/04/23 160 lb (72.6 kg)  06/02/23 163 lb (73.9 kg)  05/20/23 163 lb 12.8 oz (74.3 kg)  05/15/23 174 lb 2.6 oz (79 kg)  04/24/23 175 lb 14.8 oz (79.8 kg)     Physical Exam Vitals and nursing note reviewed.  Constitutional:      General: He is not in acute distress.    Appearance: He is well-developed.  Cardiovascular:     Rate and Rhythm: Normal rate and regular rhythm.  Pulmonary:     Effort: Pulmonary effort is normal.     Breath sounds: Normal breath sounds.  Skin:    General: Skin is warm and dry.  Neurological:     Mental Status: He is alert and oriented to person, place, and time.      Lab Results:  CBC    Component Value Date/Time   WBC 12.1 (H) 06/02/2023 0126   RBC 4.88 06/02/2023 0126   HGB 15.6 06/02/2023 0139   HGB 15.4 04/17/2023 1420   HCT 46.0 06/02/2023 0139   HCT 46.0 04/17/2023 1420   PLT 371 06/02/2023 0126   PLT 418 04/17/2023 1420   MCV 94.3 06/02/2023 0126   MCV 93 04/17/2023 1420   MCH 31.4 06/02/2023 0126   MCHC 33.3 06/02/2023 0126   RDW 12.5 06/02/2023 0126   RDW 12.6 04/17/2023 1420   LYMPHSABS 1.2 06/02/2023 0126   LYMPHSABS 2.0 07/13/2021 1158   MONOABS 0.3 06/02/2023 0126   EOSABS 0.1 06/02/2023 0126   EOSABS 0.5 (H) 07/13/2021 1158   BASOSABS 0.1 06/02/2023 0126   BASOSABS 0.1  07/13/2021 1158    BMET    Component Value Date/Time   NA 137 06/02/2023 0139   NA 136 04/17/2023 1420   K 4.5 06/02/2023 0139   CL 102 06/02/2023 0139   CO2 24 06/02/2023 0126   GLUCOSE 152 (H) 06/02/2023 0139   BUN 16 06/02/2023 0139   BUN 15 04/17/2023 1420   CREATININE 1.40 (H) 06/02/2023 0139   CALCIUM 9.8 06/02/2023 0126   GFRNONAA >60 06/02/2023 0126   GFRAA 82 10/11/2020 1444    BNP    Component Value Date/Time   BNP 41.9 11/17/2021 0053    ProBNP No results found for: "  PROBNP"  Imaging: DG Abd 1 View  Result Date: 06/02/2023 CLINICAL DATA:  Nausea, vomiting EXAM: ABDOMEN - 1 VIEW COMPARISON:  04/05/2023 FINDINGS: Bowel gas pattern is nonspecific. No abnormal masses or calcifications are seen. Kidneys are partly obscured by bowel contents. Visualized bony structures are unremarkable. IMPRESSION: Nonspecific bowel gas pattern. Electronically Signed   By: Ernie Avena M.D.   On: 06/02/2023 07:50   DG Chest Portable 1 View  Result Date: 05/17/2023 CLINICAL DATA:  Hematemesis. EXAM: PORTABLE CHEST 1 VIEW COMPARISON:  04/24/2023 FINDINGS: The lungs are clear without focal pneumonia, edema, pneumothorax or pleural effusion. The cardiopericardial silhouette is within normal limits for size. No acute bony abnormality. Telemetry leads overlie the chest. IMPRESSION: No active disease. Electronically Signed   By: Kennith Center M.D.   On: 05/17/2023 11:27     Assessment & Plan:   Chronic vomiting - Ambulatory referral to Gastroenterology  2. 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     Ivonne Andrew, NP 06/04/2023

## 2023-06-04 NOTE — Assessment & Plan Note (Signed)
-   Ambulatory referral to Gastroenterology  2. 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

## 2023-06-04 NOTE — Patient Instructions (Addendum)
1. Chronic vomiting  - Ambulatory referral to Gastroenterology  2. 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

## 2023-06-05 ENCOUNTER — Telehealth: Payer: Self-pay | Admitting: Internal Medicine

## 2023-06-05 ENCOUNTER — Encounter: Payer: Self-pay | Admitting: Gastroenterology

## 2023-06-05 ENCOUNTER — Other Ambulatory Visit: Payer: Medicaid Other | Admitting: Pharmacist

## 2023-06-05 ENCOUNTER — Other Ambulatory Visit: Payer: Self-pay

## 2023-06-05 MED ORDER — CARVEDILOL 3.125 MG PO TABS: 6.2500 mg | ORAL_TABLET | Freq: Two times a day (BID) | ORAL | Status: DC

## 2023-06-05 NOTE — Telephone Encounter (Signed)
Patient scheduled for 08/21/23 with Boone Master, PA and placed on wait list.

## 2023-06-05 NOTE — Telephone Encounter (Signed)
Error

## 2023-06-05 NOTE — Progress Notes (Signed)
06/05/2023 Name: Zachary Vazquez MRN: [id number] DOB: September 19, 1986  Chief Complaint  Patient presents with   Medication Management   Hypertension    Zachary Vazquez is a 37 y.o. year old male who presented for a telephone visit.   They were referred to the pharmacist by their PCP for assistance in managing hypertension.    Subjective:  Care Team: Primary Care Provider: Ivonne Andrew, NP ; Next Scheduled Visit: 07/16/23  Medication Access/Adherence  Current Pharmacy:  [facility] [address], [address] [place] [address] Phone: [phone] Fax: [phone]   Patient reports affordability concerns with their medications: Yes  Patient reports access/transportation concerns to their pharmacy: No  Patient reports adherence concerns with their medications:  No    Recent ED visits, hospitalizations or nausea/vomiting, DKA. Referred to GI for nausea/vomiting. Notes it is difficult to tell if metoclopramide helps or not.   Diabetes, Type 1  Current medications: Lantus 28 units QPM, Novolog per sliding scale; previously not interested in CGM  Referred to endocrinology.   Hypertension:  Current medications: carvedilol 3.125 mg twice daily - though noted he has only been taking once daily (AM), and cannot remember if he took yesterday before his appointment Medications previously tried: lisinopril - held at hospitalization due to AKI  Patient has an automated, upper arm home BP cuff Current blood pressure readings readings: reports they are generally in 140-150s at home on prior lisinopril therapy.   Reports a reading today of 170/114, HR 90. Denies chest pain, headaches, dizziness, blurred vision. This was before he took carvedilol.   Patient took carvedilol, checked his blood pressure (though did not wait an hour). He has   Patient denies hypotensive s/sx including dizziness, lightheadedness.  Patient  denies hypertensive symptoms including headache, chest pain, shortness of breath   Objective:  Lab Results  Component Value Date   HGBA1C 6.4 (A) 04/14/2023    Lab Results  Component Value Date   CREATININE 1.40 (H) 06/02/2023   BUN 16 06/02/2023   NA 137 06/02/2023   K 4.5 06/02/2023   CL 102 06/02/2023   CO2 24 06/02/2023    Lab Results  Component Value Date   CHOL 190 10/12/2021   HDL 58 10/12/2021   LDLCALC 121 (H) 10/12/2021   TRIG 57 10/12/2021   CHOLHDL 3.3 10/12/2021    Medications Reviewed Today     Reviewed by Alden Hipp, RPH-CPP (Pharmacist) on 06/05/23 at 1042  Med List Status: <None>   Medication Order Taking? Sig Documenting Provider Last Dose Status Informant  acetaminophen (TYLENOL) 325 MG tablet [id number]  Take 2 tablets (650 mg total) by mouth every 6 (six) hours as needed for mild pain (or Fever >/= 101). Vonna Drafts, MD  Active Self  carvedilol (COREG) 3.125 MG tablet [id number] Yes Take 1 tablet (3.125 mg total) by mouth 2 (two) times daily. Maurilio Lovely D, DO Taking Active Self  Continuous Blood Gluc Receiver (FREESTYLE LIBRE 2 READER) [place] [id number]  1 application. by Does not apply route every 14 (fourteen) days.  Patient not taking: Reported on 04/28/2023   Barbette Merino, NP  Active Self  Continuous Blood Gluc Sensor (FREESTYLE LIBRE 2 SENSOR) [place] [id number]  Use to check glucose  Patient not taking: Reported on 04/28/2023   Ivonne Andrew, NP  Active Self  docusate sodium (COLACE) 100 MG capsule [id number] Yes Take 1 capsule (100 mg total) by mouth 2 (two) times  daily. Ivonne Andrew, NP Taking Active Self  insulin aspart (NOVOLOG) 100 UNIT/ML injection [id number]  Use as directed per sliding scale  Patient taking differently: Inject 0-10 Units into the skin as directed. Sliding Scale   Ivonne Andrew, NP  Active Self           Med Note Antony Madura, Harlin Rain Apr 24, 2023  2:14 PM)    insulin glargine (LANTUS) 100 UNIT/ML injection  [id number] Yes Inject 0.28 mLs (28 Units total) into the skin at bedtime. Ivonne Andrew, NP Taking Active Self  metoCLOPramide (REGLAN) 10 MG tablet [id number] Yes Take 1 tablet (10 mg total) by mouth every 6 (six) hours. Sherryll Burger, Pratik D, DO Taking Active Self  ondansetron (ZOFRAN-ODT) 4 MG disintegrating tablet [id number] Yes Take 1 tablet (4 mg total) by mouth every 8 (eight) hours as needed for nausea or vomiting. Sherryll Burger, Pratik D, DO Taking Active Self  pantoprazole (PROTONIX) 40 MG tablet [id number] No Take 1 tablet (40 mg total) by mouth 2 (two) times daily before a meal for 15 days.  Patient not taking: Reported on 05/17/2023   Glade Lloyd, MD Not Taking Expired 05/17/23 2359   polyethylene glycol powder (GLYCOLAX/MIRALAX) 17 GM/SCOOP powder [id number]  Mix 1capful (17 g) with 4-8 ounces with water/juice by mouth 2 (two) times daily as needed.  Patient not taking: Reported on 05/16/2023   Ivonne Andrew, NP  Active Self  Probiotic Product [facility]) CAPS [id number]  Take 1 capsule by mouth daily.  Patient not taking: Reported on 05/16/2023   Ivonne Andrew, NP  Active Self              Assessment/Plan:   Diabetes: - Currently controlled - Recommend to connect with endocrinology. Continue current regimen at this time.  Hypertension: - Currently uncontrolled - Reviewed long term cardiovascular and renal outcomes of uncontrolled blood pressure - Reviewed appropriate blood pressure monitoring technique and reviewed goal blood pressure. Recommended to check home blood pressure and heart rate twice daily/ - Recommend to increase carvedilol to 6.25 mg twice daily. Will hold off on ACEI until able to recheck renal function.  - Patient will check blood pressure this afternoon and tomorrow. I will call tomorrow afternoon to follow up. Patient instructed to seek emergency medical attention if development of headaches, chest pain, vision changes, slurred speech.     Follow Up Plan: phone  call tomorrow  Catie Eppie Gibson, PharmD, BCACP, CPP Clinical Pharmacist [facility] [phone]

## 2023-06-05 NOTE — Telephone Encounter (Signed)
Okay to schedule in next available appointment slot with any provider. Things that the PCP could do in the interim would be to order a RUQ U/S to evaluate for gallstones and/or gastric emptying study to evaluate for gastroparesis

## 2023-06-05 NOTE — Telephone Encounter (Signed)
Urgent referral in [place] from PCP for chronic vomiting.  Patient stated he has been hospitalized 3-4 times in the past month.  No GI Hx per patient.  Please review and advise scheduling and urgency.  Thanks  Dr. Leonides Schanz DOD 8/8/24PM

## 2023-06-05 NOTE — Patient Instructions (Signed)
Jamauri,   Increase carvedilol to 6.25 mg (two tablets of 3.125 mg) twice daily. Please check a blood pressure this evening and check one tomorrow mid-day. I will call tomorrow afternoon to follow up.   If you develop any chest pain, vision changes, severe headaches, or speech changes, please seek emergent medical attention.   Catie Eppie Gibson, PharmD, BCACP, CPP Clinical Pharmacist [facility] [phone]

## 2023-06-06 ENCOUNTER — Other Ambulatory Visit: Payer: Self-pay

## 2023-06-06 ENCOUNTER — Other Ambulatory Visit: Payer: Medicaid Other | Admitting: Pharmacist

## 2023-06-06 NOTE — Telephone Encounter (Signed)
Contacted the patient. Moved appointment to Doug Sou, [facility] on 06/13/23 at 8:30 am. Patient accepts this appointment.

## 2023-06-13 ENCOUNTER — Ambulatory Visit (INDEPENDENT_AMBULATORY_CARE_PROVIDER_SITE_OTHER): Payer: Medicaid Other | Admitting: Gastroenterology

## 2023-06-13 ENCOUNTER — Other Ambulatory Visit: Payer: Self-pay

## 2023-06-13 ENCOUNTER — Ambulatory Visit: Payer: Medicaid Other | Admitting: Gastroenterology

## 2023-06-13 ENCOUNTER — Encounter: Payer: Self-pay | Admitting: Gastroenterology

## 2023-06-13 VITALS — BP 128/86 | HR 101 | Ht 71.0 in | Wt 161.0 lb

## 2023-06-13 DIAGNOSIS — R112 Nausea with vomiting, unspecified: Secondary | ICD-10-CM

## 2023-06-13 DIAGNOSIS — E109 Type 1 diabetes mellitus without complications: Secondary | ICD-10-CM | POA: Diagnosis not present

## 2023-06-13 MED ORDER — PANTOPRAZOLE SODIUM 40 MG PO TBEC
40.0000 mg | DELAYED_RELEASE_TABLET | Freq: Every day | ORAL | 1 refills | Status: DC
  Filled 2023-06-13: qty 90, 90d supply, fill #0
  Filled 2023-09-01: qty 90, 90d supply, fill #1

## 2023-06-13 NOTE — Progress Notes (Unsigned)
06/13/2023 Jeison Glazewski [id number] Mar 30, 1986   HISTORY OF PRESENT ILLNESS: ***         Past Medical History:  Diagnosis Date   Cannabis hyperemesis syndrome concurrent with and due to cannabis abuse (HCC)    History of delayed wound healing 10/2019   Tetrahydrocannabinol (THC) use disorder, severe, dependence (HCC)    Type 1 diabetes mellitus (HCC)    Vitamin D deficiency 09/2020   Past Surgical History:  Procedure Laterality Date   TONSILLECTOMY      reports that he has been smoking cigarettes. He has a 15 pack-year smoking history. He has never used smokeless tobacco. He reports current alcohol use. He reports current drug use. Drug: Marijuana. family history includes Colon cancer in his paternal grandmother; Diabetes Mellitus I in his brother; Hypertension in his mother. Allergies  Allergen Reactions   Clindamycin/Lincomycin Anaphylaxis   Bactrim [Sulfamethoxazole-Trimethoprim] Swelling and Other (See Comments)    Eyes swell and bumps appear on/near the eyes       Outpatient Encounter Medications as of 06/13/2023  Medication Sig   acetaminophen (TYLENOL) 325 MG tablet Take 2 tablets (650 mg total) by mouth every 6 (six) hours as needed for mild pain (or Fever >/= 101).   carvedilol (COREG) 3.125 MG tablet Take 2 tablets (6.25 mg total) by mouth 2 (two) times daily.   insulin aspart (NOVOLOG) 100 UNIT/ML injection Use as directed per sliding scale (Patient taking differently: Inject 0-10 Units into the skin as directed. Sliding Scale)   insulin glargine (LANTUS) 100 UNIT/ML injection Inject 0.28 mLs (28 Units total) into the skin at bedtime.   metoCLOPramide (REGLAN) 10 MG tablet Take 1 tablet (10 mg total) by mouth every 6 (six) hours.   ondansetron (ZOFRAN-ODT) 4 MG disintegrating tablet Take 1 tablet (4 mg total) by mouth every 8 (eight) hours as needed for nausea or vomiting.   polyethylene glycol powder (GLYCOLAX/MIRALAX) 17 GM/SCOOP powder Mix 1capful  (17 g) with 4-8 ounces with water/juice by mouth 2 (two) times daily as needed.   Continuous Blood Gluc Receiver (FREESTYLE LIBRE 2 READER) DEVI 1 application. by Does not apply route every 14 (fourteen) days. (Patient not taking: Reported on 04/28/2023)   Continuous Blood Gluc Sensor (FREESTYLE LIBRE 2 SENSOR) MISC Use to check glucose (Patient not taking: Reported on 04/28/2023)   [DISCONTINUED] docusate sodium (COLACE) 100 MG capsule Take 1 capsule (100 mg total) by mouth 2 (two) times daily.   [DISCONTINUED] pantoprazole (PROTONIX) 40 MG tablet Take 1 tablet (40 mg total) by mouth 2 (two) times daily before a meal for 15 days. (Patient not taking: Reported on 05/17/2023)   [DISCONTINUED] Probiotic Product (RESTORA) CAPS Take 1 capsule by mouth daily. (Patient not taking: Reported on 05/16/2023)   No facility-administered encounter medications on file as of 06/13/2023.     REVIEW OF SYSTEMS  : All other systems reviewed and negative except where noted in the History of Present Illness.   PHYSICAL EXAM: BP 128/86   Pulse (!) 101   Ht 5\' 11"  (1.803 m)   Wt 161 lb (73 kg)   BMI 22.45 kg/m  General: Well developed white male in no acute distress Head: Normocephalic and atraumatic Eyes:  sclerae anicteric,conjunctive pink. Ears: Normal auditory acuity Neck: Supple, no masses.  Lungs: Clear throughout to auscultation Heart: Regular rate and rhythm Abdomen: Soft, nontender, non distended. No masses or hepatomegaly noted. Normal bowel sounds Rectal: *** Musculoskeletal: Symmetrical with no gross deformities  Skin: No  lesions on visible extremities Extremities: No edema  Neurological: Alert oriented x 4, grossly nonfocal Cervical Nodes:  No significant cervical adenopathy Psychological:  Alert and cooperative. Normal mood and affect  ASSESSMENT AND PLAN:    CC:  Ivonne Andrew, NP

## 2023-06-13 NOTE — Patient Instructions (Signed)
We have sent the following medications to your pharmacy for you to pick up at your convenience:  Pantoprazole.  Take Reglan 10mg  twice a day.  Continue to stay off marijuana.  Miralax daily as discussed.  _______________________________________________________  If your blood pressure at your visit was 140/90 or greater, please contact your primary care physician to follow up on this.  _______________________________________________________  If you are age 38 or older, your body mass index should be between 23-30. Your Body mass index is 22.45 kg/m. If this is out of the aforementioned range listed, please consider follow up with your Primary Care Provider.  If you are age 42 or younger, your body mass index should be between 19-25. Your Body mass index is 22.45 kg/m. If this is out of the aformentioned range listed, please consider follow up with your Primary Care Provider.   ________________________________________________________  The [facility] providers would like to encourage you to use [facility] to communicate with providers for non-urgent requests or questions.  Due to long hold times on the telephone, sending your provider a message by [facility] may be a faster and more efficient way to get a response.  Please allow 48 business hours for a response.  Please remember that this is for non-urgent requests.  _______________________________________________________

## 2023-06-16 ENCOUNTER — Other Ambulatory Visit: Payer: Self-pay

## 2023-06-16 ENCOUNTER — Other Ambulatory Visit: Payer: Self-pay | Admitting: Nurse Practitioner

## 2023-06-17 ENCOUNTER — Encounter: Payer: Self-pay | Admitting: Gastroenterology

## 2023-06-17 ENCOUNTER — Other Ambulatory Visit: Payer: Self-pay

## 2023-06-17 MED ORDER — CARVEDILOL 3.125 MG PO TABS
3.1250 mg | ORAL_TABLET | Freq: Two times a day (BID) | ORAL | 0 refills | Status: DC
  Filled 2023-06-17: qty 60, 30d supply, fill #0

## 2023-06-17 NOTE — Telephone Encounter (Signed)
Please advise Kh 

## 2023-06-18 ENCOUNTER — Other Ambulatory Visit: Payer: Self-pay

## 2023-06-18 NOTE — Progress Notes (Signed)
Agree with assessment/plan.  Raj Gupta, MD [facility] [phone]  

## 2023-07-01 ENCOUNTER — Other Ambulatory Visit: Payer: Self-pay

## 2023-07-01 ENCOUNTER — Other Ambulatory Visit: Payer: Self-pay | Admitting: Nurse Practitioner

## 2023-07-02 ENCOUNTER — Other Ambulatory Visit: Payer: Self-pay

## 2023-07-02 MED ORDER — CARVEDILOL 3.125 MG PO TABS
3.1250 mg | ORAL_TABLET | Freq: Two times a day (BID) | ORAL | 0 refills | Status: DC
  Filled 2023-07-02 (×2): qty 60, 30d supply, fill #0

## 2023-07-02 MED ORDER — CARVEDILOL 6.25 MG PO TABS
6.2500 mg | ORAL_TABLET | Freq: Two times a day (BID) | ORAL | 1 refills | Status: DC
  Filled 2023-07-02: qty 180, 90d supply, fill #0

## 2023-07-02 NOTE — Addendum Note (Signed)
Addended by: Nilda Simmer T on: 07/02/2023 02:24 PM   Modules accepted: Orders

## 2023-07-03 ENCOUNTER — Other Ambulatory Visit: Payer: Self-pay

## 2023-07-16 ENCOUNTER — Ambulatory Visit: Payer: Medicaid Other | Admitting: Nurse Practitioner

## 2023-07-16 ENCOUNTER — Encounter: Payer: Self-pay | Admitting: Nurse Practitioner

## 2023-07-16 ENCOUNTER — Other Ambulatory Visit: Payer: Self-pay

## 2023-07-16 VITALS — BP 150/91 | HR 84 | Temp 98.4°F | Ht 71.0 in | Wt 171.2 lb

## 2023-07-16 DIAGNOSIS — I152 Hypertension secondary to endocrine disorders: Secondary | ICD-10-CM

## 2023-07-16 DIAGNOSIS — E109 Type 1 diabetes mellitus without complications: Secondary | ICD-10-CM

## 2023-07-16 DIAGNOSIS — E1010 Type 1 diabetes mellitus with ketoacidosis without coma: Secondary | ICD-10-CM

## 2023-07-16 LAB — POCT GLYCOSYLATED HEMOGLOBIN (HGB A1C): HbA1c, POC (controlled diabetic range): 6 % (ref 0.0–7.0)

## 2023-07-16 MED ORDER — CARVEDILOL 12.5 MG PO TABS
12.5000 mg | ORAL_TABLET | Freq: Two times a day (BID) | ORAL | 3 refills | Status: DC
Start: 2023-07-16 — End: 2023-10-15
  Filled 2023-07-16 – 2023-08-04 (×2): qty 60, 30d supply, fill #0
  Filled 2023-10-01: qty 60, 30d supply, fill #1

## 2023-07-16 NOTE — Progress Notes (Unsigned)
Subjective   Patient ID: Zachary Vazquez, male    DOB: 30-Jan-1986, 38 y.o.   MRN: [id number]  Chief Complaint  Patient presents with   Medical Management of Chronic Issues    Referring provider: Ivonne Andrew, NP  Jordache Mcelravy is a 37 y.o. 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  We have placed multiple referrals to endocrinology but patient refuses to go.  He does have type 1 diabetes.  He is scheduled to follow-up with GI in the near future for recent chronic vomiting episodes.  Patient states that he is currently working and is working on hours on alternating schedule.  Blood pressure is elevated in office today.  We will increase dosage of blood pressure medicine patient states his blood pressures have been running high at home as well.  Pharmacy will follow to help with medication management. Denies f/c/s, n/v/d, hemoptysis, PND, leg swelling Denies chest pain or edema     Allergies  Allergen Reactions   Clindamycin/Lincomycin Anaphylaxis   Bactrim [Sulfamethoxazole-Trimethoprim] Swelling and Other (See Comments)    Eyes swell and bumps appear on/near the eyes     Immunization History  Administered Date(s) Administered   Tdap 12/15/2019    Tobacco History: Social History   Tobacco Use  Smoking Status Every Day   Current packs/day: 1.00   Average packs/day: 1 pack/day for 15.0 years (15.0 ttl pk-yrs)   Types: Cigarettes  Smokeless Tobacco Never   Ready to quit: Not Answered Counseling given: Not Answered   Outpatient Encounter Medications as of 07/16/2023  Medication Sig   acetaminophen (TYLENOL) 325 MG tablet Take 2  tablets (650 mg total) by mouth every 6 (six) hours as needed for mild pain (or Fever >/= 101).   carvedilol (COREG) 12.5 MG tablet Take 1 tablet (12.5 mg total) by mouth 2 (two) times daily with a meal.   insulin aspart (NOVOLOG) 100 UNIT/ML injection Use as directed per sliding scale (Patient taking differently: Inject 0-10 Units into the skin as directed. Sliding Scale)   insulin glargine (LANTUS) 100 UNIT/ML injection Inject 0.28 mLs (28 Units total) into the skin at bedtime.   ondansetron (ZOFRAN-ODT) 4 MG disintegrating tablet Take 1 tablet (4 mg total) by mouth every 8 (eight) hours as needed for nausea or vomiting.   pantoprazole (PROTONIX) 40 MG tablet Take 1 tablet (40 mg total) by mouth daily.   polyethylene glycol powder (GLYCOLAX/MIRALAX) 17 GM/SCOOP powder Mix 1capful (17 g) with 4-8 ounces with water/juice by mouth 2 (two) times daily as needed.   [DISCONTINUED] carvedilol (COREG) 6.25 MG tablet Take 1 tablet (6.25 mg total) by mouth 2 (two) times daily.   Continuous Blood Gluc Receiver (FREESTYLE LIBRE 2 READER) DEVI 1 application. by Does not apply route every 14 (fourteen) days. (Patient not taking: Reported on 04/28/2023)   Continuous Blood Gluc Sensor (FREESTYLE LIBRE 2 SENSOR) MISC Use to check glucose (Patient not taking: Reported on 04/28/2023)   [DISCONTINUED] docusate sodium (COLACE) 100 MG capsule Take 1 capsule (100 mg total) by mouth 2 (two) times daily.   [DISCONTINUED] insulin glargine (LANTUS) 100 UNIT/ML injection Inject 0.35 mLs (35 Units total) into the skin once nightly at bedtime.   [  DISCONTINUED] lisinopril (ZESTRIL) 40 MG tablet Take 1 tablet (40 mg total) by mouth daily.   [DISCONTINUED] metoCLOPramide (REGLAN) 10 MG tablet Take 1 tablet (10 mg total) by mouth every 6 (six) hours. (Patient not taking: Reported on 07/16/2023)   [DISCONTINUED] metoCLOPramide (REGLAN) 5 MG tablet Take 1 tablet (5 mg total) by mouth every 8 (eight) hours as needed for nausea or vomiting.    [DISCONTINUED] nicotine (NICODERM CQ - DOSED IN MG/24 HOURS) 14 mg/24hr patch Place 1 patch (14 mg total) onto the skin daily. (Patient not taking: Reported on 04/14/2023)   [DISCONTINUED] Probiotic Product (RESTORA) CAPS Take 1 capsule by mouth daily. (Patient not taking: Reported on 05/16/2023)   No facility-administered encounter medications on file as of 07/16/2023.    Review of Systems  Review of Systems  Constitutional: Negative.   HENT: Negative.    Cardiovascular: Negative.   Gastrointestinal: Negative.   Allergic/Immunologic: Negative.   Neurological: Negative.   Psychiatric/Behavioral: Negative.       Objective:   BP (!) 150/91   Pulse 84   Temp 98.4 F (36.9 C) (Oral)   Ht 5\' 11"  (1.803 m)   Wt 171 lb 3.2 oz (77.7 kg)   SpO2 100%   BMI 23.88 kg/m   Wt Readings from Last 5 Encounters:  07/16/23 171 lb 3.2 oz (77.7 kg)  06/13/23 161 lb (73 kg)  06/04/23 160 lb (72.6 kg)  06/02/23 163 lb (73.9 kg)  05/20/23 163 lb 12.8 oz (74.3 kg)     Physical Exam Vitals and nursing note reviewed.  Constitutional:      General: He is not in acute distress.    Appearance: He is well-developed.  Cardiovascular:     Rate and Rhythm: Normal rate and regular rhythm.  Pulmonary:     Effort: Pulmonary effort is normal.     Breath sounds: Normal breath sounds.  Skin:    General: Skin is warm and dry.  Neurological:     Mental Status: He is alert and oriented to person, place, and time.       Assessment & Plan:   Type 1 diabetes mellitus without complication (HCC) -     POCT glycosylated hemoglobin (Hb A1C) -     CBC -     Comprehensive metabolic panel  Hypertension associated with diabetes (HCC) -     CBC -     Comprehensive metabolic panel -     Carvedilol; Take 1 tablet (12.5 mg total) by mouth 2 (two) times daily with a meal.  Dispense: 60 tablet; Refill: 3     Return in about 3 months (around 10/15/2023).   Ivonne Andrew, NP 07/17/2023

## 2023-07-16 NOTE — Progress Notes (Unsigned)
Patient stated he doesn't think there is anything he needs to talk to you about.

## 2023-07-16 NOTE — Patient Instructions (Addendum)
1. Type 1 diabetes mellitus without complication (HCC)  - POCT glycosylated hemoglobin (Hb A1C) - CBC - Comprehensive metabolic panel  2. Hypertension associated with diabetes (HCC)  - CBC - Comprehensive metabolic panel - carvedilol (COREG) 12.5 MG tablet; Take 1 tablet (12.5 mg total) by mouth 2 (two) times daily with a meal.  Dispense: 60 tablet; Refill: 3   Follow up:  Follow up in 3 months

## 2023-07-17 ENCOUNTER — Encounter: Payer: Self-pay | Admitting: Nurse Practitioner

## 2023-07-17 LAB — COMPREHENSIVE METABOLIC PANEL
ALT: 17 IU/L (ref 0–44)
AST: 16 IU/L (ref 0–40)
Albumin: 4.4 g/dL (ref 4.1–5.1)
Alkaline Phosphatase: 64 IU/L (ref 44–121)
BUN/Creatinine Ratio: 13 (ref 9–20)
BUN: 15 mg/dL (ref 6–20)
Bilirubin Total: 0.3 mg/dL (ref 0.0–1.2)
CO2: 20 mmol/L (ref 20–29)
Calcium: 9.5 mg/dL (ref 8.7–10.2)
Chloride: 106 mmol/L (ref 96–106)
Creatinine, Ser: 1.19 mg/dL (ref 0.76–1.27)
Globulin, Total: 1.9 g/dL (ref 1.5–4.5)
Glucose: 113 mg/dL — ABNORMAL HIGH (ref 70–99)
Potassium: 4.4 mmol/L (ref 3.5–5.2)
Sodium: 143 mmol/L (ref 134–144)
Total Protein: 6.3 g/dL (ref 6.0–8.5)
eGFR: 81 mL/min/{1.73_m2} (ref >59)

## 2023-07-17 LAB — CBC
Hematocrit: 40.2 % (ref 37.5–51.0)
Hemoglobin: 13.8 g/dL (ref 13.0–17.7)
MCH: 32.2 pg (ref 26.6–33.0)
MCHC: 34.3 g/dL (ref 31.5–35.7)
MCV: 94 fL (ref 79–97)
Platelets: 259 10*3/uL (ref 150–450)
RBC: 4.29 x10E6/uL (ref 4.14–5.80)
RDW: 12.2 % (ref 11.6–15.4)
WBC: 9.2 10*3/uL (ref 3.4–10.8)

## 2023-07-21 ENCOUNTER — Other Ambulatory Visit: Payer: Self-pay

## 2023-07-21 ENCOUNTER — Emergency Department (HOSPITAL_COMMUNITY): Payer: Medicaid Other

## 2023-07-21 ENCOUNTER — Emergency Department (HOSPITAL_COMMUNITY)
Admission: EM | Admit: 2023-07-21 | Discharge: 2023-07-22 | Disposition: A | Discharge status: Home / Self Care | Payer: Medicaid Other | Attending: Emergency Medicine | Admitting: Emergency Medicine

## 2023-07-21 DIAGNOSIS — E109 Type 1 diabetes mellitus without complications: Secondary | ICD-10-CM | POA: Diagnosis not present

## 2023-07-21 DIAGNOSIS — Z794 Long term (current) use of insulin: Secondary | ICD-10-CM | POA: Diagnosis not present

## 2023-07-21 DIAGNOSIS — R4182 Altered mental status, unspecified: Secondary | ICD-10-CM | POA: Insufficient documentation

## 2023-07-21 DIAGNOSIS — D72829 Elevated white blood cell count, unspecified: Secondary | ICD-10-CM | POA: Insufficient documentation

## 2023-07-21 LAB — CBG MONITORING, ED
Glucose-Capillary: 140 mg/dL — ABNORMAL HIGH (ref 70–99)
Glucose-Capillary: 144 mg/dL — ABNORMAL HIGH (ref 70–99)

## 2023-07-21 LAB — CBC WITH DIFFERENTIAL/PLATELET
Abs Immature Granulocytes: 0.08 10*3/uL — ABNORMAL HIGH (ref 0.00–0.07)
Basophils Absolute: 0.1 10*3/uL (ref 0.0–0.1)
Basophils Relative: 1 %
Eosinophils Absolute: 0.2 10*3/uL (ref 0.0–0.5)
Eosinophils Relative: 2 %
HCT: 40.4 % (ref 39.0–52.0)
Hemoglobin: 13.4 g/dL (ref 13.0–17.0)
Immature Granulocytes: 1 %
Lymphocytes Relative: 16 %
Lymphs Abs: 2 10*3/uL (ref 0.7–4.0)
MCH: 31 pg (ref 26.0–34.0)
MCHC: 33.2 g/dL (ref 30.0–36.0)
MCV: 93.5 fL (ref 80.0–100.0)
Monocytes Absolute: 0.6 10*3/uL (ref 0.1–1.0)
Monocytes Relative: 5 %
Neutro Abs: 9.4 10*3/uL — ABNORMAL HIGH (ref 1.7–7.7)
Neutrophils Relative %: 75 %
Platelets: 255 10*3/uL (ref 150–400)
RBC: 4.32 MIL/uL (ref 4.22–5.81)
RDW: 12.3 % (ref 11.5–15.5)
WBC: 12.4 10*3/uL — ABNORMAL HIGH (ref 4.0–10.5)
nRBC: 0 % (ref 0.0–0.2)

## 2023-07-21 LAB — I-STAT CG4 LACTIC ACID, ED
Lactic Acid, Venous: 3.9 mmol/L (ref 0.5–1.9)
Lactic Acid, Venous: 2.5 mmol/L (ref 0.5–1.9)

## 2023-07-21 LAB — COMPREHENSIVE METABOLIC PANEL
ALT: 24 U/L (ref 0–44)
AST: 27 U/L (ref 15–41)
Albumin: 3.7 g/dL (ref 3.5–5.0)
Alkaline Phosphatase: 45 U/L (ref 38–126)
Anion gap: 13 (ref 5–15)
BUN: 17 mg/dL (ref 6–20)
CO2: 23 mmol/L (ref 22–32)
Calcium: 8.9 mg/dL (ref 8.9–10.3)
Chloride: 100 mmol/L (ref 98–111)
Creatinine, Ser: 1.4 mg/dL — ABNORMAL HIGH (ref 0.61–1.24)
GFR, Estimated: >60 mL/min (ref >60)
Glucose, Bld: 181 mg/dL — ABNORMAL HIGH (ref 70–99)
Potassium: 4 mmol/L (ref 3.5–5.1)
Sodium: 136 mmol/L (ref 135–145)
Total Bilirubin: 0.5 mg/dL (ref 0.3–1.2)
Total Protein: 6.4 g/dL — ABNORMAL LOW (ref 6.5–8.1)

## 2023-07-21 LAB — MAGNESIUM: Magnesium: 1.9 mg/dL (ref 1.7–2.4)

## 2023-07-21 MED ORDER — LORAZEPAM 2 MG/ML IJ SOLN: 1.0000 mg | Freq: Once | INTRAMUSCULAR | Status: DC

## 2023-07-21 MED ORDER — ONDANSETRON HCL 4 MG/2ML IJ SOLN
4.0000 mg | Freq: Once | INTRAMUSCULAR | Status: AC
  Administered 2023-07-21: 4 mg via INTRAVENOUS
  Filled 2023-07-21: qty 2

## 2023-07-21 MED ORDER — DROPERIDOL 2.5 MG/ML IJ SOLN
2.5000 mg | Freq: Once | INTRAMUSCULAR | Status: AC
  Administered 2023-07-21: 2.5 mg via INTRAVENOUS
  Filled 2023-07-21: qty 2

## 2023-07-21 MED ORDER — LACTATED RINGERS IV BOLUS
1000.0000 mL | Freq: Once | INTRAVENOUS | Status: AC
  Administered 2023-07-21: 1000 mL via INTRAVENOUS

## 2023-07-21 NOTE — ED Triage Notes (Addendum)
Pt BIB [facility] from home. Family witnessed pt "posturing in and then full body jerking" and then became unresponsive. Pt's family was unable to arouse pt. Diaphoretic, tachypnic and agitated with EMS. Alert to self. Pt's family reports substance abuse hx. EMS placed pt on 2L [place].   CBG was 60 and EMS administered 1 mg glucagon and 12.5 mg D10   20RAC  EMS VS  BP 148/104 P 90 O2 96% 2L CBG 141

## 2023-07-21 NOTE — ED Provider Notes (Signed)
[place] EMERGENCY DEPARTMENT AT [facility] Provider Note   CSN: [id number] Arrival date & time: 07/21/23  1830     History  Chief Complaint  Patient presents with   Hypoglycemia    Zachary Vazquez is a 37 y.o. male.  HPI 37 year old male with past medical history of type 1 diabetes mellitus presenting to the emergency department for evaluation of altered mental status.  Per EMS, family witnessed the patient exhibiting some upper extremity posturing followed by full body jerking.  He then became unresponsive for a brief period of time.  When EMS arrived he was both diaphoretic and tachypneic.  EMS administered glucagon as well as 2.5 mg of D10.  Blood glucose was reportedly less than 60.  On my evaluation, patient states that he does not remember the above findings.  He says that he takes both long and short acting insulin.  Unclear if there have been any regimen changes.  He is not sure if he has missed any recent doses.  He reports feeling poorly the last several days with some nausea.  However, denies fever, chest pain, shortness of breath, abdominal pain, pain with urination, urinary frequency.    Home Medications Prior to Admission medications   Medication Sig Start Date End Date Taking? Authorizing Provider  acetaminophen (TYLENOL) 325 MG tablet Take 2 tablets (650 mg total) by mouth every 6 (six) hours as needed for mild pain (or Fever >/= 101). 04/08/23  Yes Vonna Drafts, MD  carvedilol (COREG) 12.5 MG tablet Take 1 tablet (12.5 mg total) by mouth 2 (two) times daily with a meal. 07/16/23  Yes Ivonne Andrew, NP  insulin aspart (NOVOLOG) 100 UNIT/ML injection Use as directed per sliding scale Patient taking differently: Inject 0-10 Units into the skin as directed. Sliding Scale 07/15/22  Yes Ivonne Andrew, NP  insulin glargine (LANTUS) 100 UNIT/ML injection Inject 0.28 mLs (28 Units total) into the skin at bedtime. 04/14/23  Yes Ivonne Andrew, NP   ondansetron (ZOFRAN-ODT) 4 MG disintegrating tablet Take 1 tablet (4 mg total) by mouth every 8 (eight) hours as needed for nausea or vomiting. 05/20/23  Yes Sherryll Burger, Pratik D, DO  pantoprazole (PROTONIX) 40 MG tablet Take 1 tablet (40 mg total) by mouth daily. 06/13/23  Yes Zehr, Shanda Bumps D, PA-C  polyethylene glycol powder (GLYCOLAX/MIRALAX) 17 GM/SCOOP powder Mix 1capful (17 g) with 4-8 ounces with water/juice by mouth 2 (two) times daily as needed. 04/14/23  Yes Ivonne Andrew, NP  Continuous Blood Gluc Receiver (FREESTYLE LIBRE 2 READER) DEVI 1 application. by Does not apply route every 14 (fourteen) days. Patient not taking: Reported on 04/28/2023 01/10/22   Barbette Merino, NP  Continuous Blood Gluc Sensor (FREESTYLE LIBRE 2 SENSOR) MISC Use to check glucose Patient not taking: Reported on 04/28/2023 08/21/22   Ivonne Andrew, NP      Allergies    Clindamycin/lincomycin and Bactrim [sulfamethoxazole-trimethoprim]    Review of Systems   Review of Systems  Physical Exam Updated Vital Signs BP (!) 139/93   Pulse 87   Temp 97.8 F (36.6 C) (Oral)   Resp 15   Ht 6' (1.829 m)   Wt 90.7 kg   SpO2 98%   BMI 27.12 kg/m  Physical Exam Constitutional:      General: He is not in acute distress.    Appearance: He is ill-appearing.     Comments: Slow to respond to questions  HENT:     Head: Normocephalic  and atraumatic.     Right Ear: External ear normal.     Left Ear: External ear normal.     Nose: Nose normal.     Mouth/Throat:     Mouth: Mucous membranes are moist.     Pharynx: Oropharynx is clear. No oropharyngeal exudate.     Comments: No tongue lacerations Eyes:     Conjunctiva/sclera: Conjunctivae normal.     Pupils: Pupils are equal, round, and reactive to light.  Cardiovascular:     Rate and Rhythm: Normal rate and regular rhythm.     Pulses: Normal pulses.  Pulmonary:     Effort: Pulmonary effort is normal. No respiratory distress.     Breath sounds: No wheezing.   Abdominal:     General: Abdomen is flat.     Palpations: Abdomen is soft.     Tenderness: There is no abdominal tenderness. There is no guarding or rebound.  Musculoskeletal:        General: Normal range of motion.     Cervical back: Normal range of motion. No tenderness.     Right lower leg: No edema.     Left lower leg: No edema.  Skin:    General: Skin is warm and dry.     Findings: No rash.  Neurological:     Mental Status: He is alert.     Cranial Nerves: No cranial nerve deficit.     Sensory: No sensory deficit.     Motor: No weakness.     Comments: Slow to respond to questions.  Alert and oriented otherwise     ED Results / Procedures / Treatments   Labs (all labs ordered are listed, but only abnormal results are displayed) Labs Reviewed  CBC WITH DIFFERENTIAL/PLATELET - Abnormal; Notable for the following components:      Result Value   WBC 12.4 (*)    Neutro Abs 9.4 (*)    Abs Immature Granulocytes 0.08 (*)    All other components within normal limits  COMPREHENSIVE METABOLIC PANEL - Abnormal; Notable for the following components:   Glucose, Bld 181 (*)    Creatinine, Ser 1.40 (*)    Total Protein 6.4 (*)    All other components within normal limits  CBG MONITORING, ED - Abnormal; Notable for the following components:   Glucose-Capillary 140 (*)    All other components within normal limits  I-STAT CG4 LACTIC ACID, ED - Abnormal; Notable for the following components:   Lactic Acid, Venous 3.9 (*)    All other components within normal limits  I-STAT CG4 LACTIC ACID, ED - Abnormal; Notable for the following components:   Lactic Acid, Venous 2.5 (*)    All other components within normal limits  CBG MONITORING, ED - Abnormal; Notable for the following components:   Glucose-Capillary 144 (*)    All other components within normal limits  MAGNESIUM  URINALYSIS, W/ REFLEX TO CULTURE (INFECTION SUSPECTED)  RAPID URINE DRUG SCREEN, HOSP PERFORMED     EKG None  Radiology CT Head Wo Contrast  Result Date: 07/21/2023 CLINICAL DATA:  Altered mental status. EXAM: CT HEAD WITHOUT CONTRAST TECHNIQUE: Contiguous axial images were obtained from the base of the skull through the vertex without intravenous contrast. RADIATION DOSE REDUCTION: This exam was performed according to the departmental dose-optimization program which includes automated exposure control, adjustment of the mA and/or kV according to patient size and/or use of iterative reconstruction technique. COMPARISON:  August 26, 2021 FINDINGS: Brain: No evidence of acute  infarction, hemorrhage, hydrocephalus, extra-axial collection or mass lesion/mass effect. Vascular: No hyperdense vessel or unexpected calcification. Skull: Normal. Negative for fracture or focal lesion. Sinuses/Orbits: Small polyps versus mucous retention cysts are seen along the medial wall of the left maxillary sinus. Other: None. IMPRESSION: No acute intracranial abnormality. Electronically Signed   By: Aram Candela M.D.   On: 07/21/2023 23:08   DG Chest Portable 1 View  Result Date: 07/21/2023 CLINICAL DATA:  Altered mental status EXAM: PORTABLE CHEST 1 VIEW COMPARISON:  Chest x-ray 05/17/2023 FINDINGS: The heart size and mediastinal contours are within normal limits. Both lungs are clear. The visualized skeletal structures are unremarkable. IMPRESSION: No active disease. Electronically Signed   By: Darliss Cheney M.D.   On: 07/21/2023 20:41    Procedures Procedures    Medications Ordered in ED Medications  LORazepam (ATIVAN) injection 1 mg (0 mg Intravenous Hold 07/21/23 2317)  ondansetron (ZOFRAN) injection 4 mg (4 mg Intravenous Given 07/21/23 1858)  lactated ringers bolus 1,000 mL (0 mLs Intravenous Stopped 07/21/23 2210)  droperidol (INAPSINE) 2.5 MG/ML injection 2.5 mg (2.5 mg Intravenous Given 07/21/23 2051)    ED Course/ Medical Decision Making/ A&P                                 Medical Decision  Making Amount and/or Complexity of Data Reviewed Labs: ordered. Radiology: ordered.  Risk Prescription drug management.   37 year old male with past medical history and HPI as above.  Vital signs stable upon arrival.,  Notably, patient is afebrile.  Point-of-care glucose was 140.  Patient did appear somewhat somnolent and was slow to respond to questions.  In addition, could not answer all questions appropriately, for example, could not tell me if he has been taking his medications or if there are any recent medication changes.  Etiology for patient's symptoms are unclear.  As his blood glucose was less than 60, this could represent a hypoglycemic event with seizure activity.  May also represent new seizure activity.  DKA is within a possibility, but low blood sugar and a point-of-care glucose of 140 would make this less likely.  Other infectious sources (Meningitis, encephalitis, brain abscess, HIV encephalopathy, neurosyphilis, CJD, toxo, tetanus, neurocysticercosis), Toxic (withdrawal, intoxication, hypo/hyperglycemia, hypo/hypernatremia, hypocalcemia, hypomagnesemia, alkalosis, uremia, hepatic failure, thyrotoxicosis), Neoplasm (brain tumor, CNS lymphoma) were considered.   Laboratory workup reviewed.  He has a mild leukocytosis of 12.4.  On metabolic panel, creatinine is 1.4, only slightly increased from baseline.  While glucose and metabolic panel was 181, his anion gap is normal.  Patient is not in DKA.  Chest x-ray reviewed and does not have any focal consolidation suggestive of pneumonia.  No pneumothorax. Head CT without acute intracranial abnormality.   On multiple reassessments, patient is poor appearing.  He exhibits some tremulousness, but his nausea and vomiting has subsided following the introduction of droperidol.     The plan for this patient was discussed with Dr. Wilkie Aye, who voiced agreement and who oversaw evaluation and treatment of this patient.    Final Clinical  Impression(s) / ED Diagnoses Final diagnoses:  Altered mental status, unspecified altered mental status type    Rx / DC Orders ED Discharge Orders     None

## 2023-07-21 NOTE — ED Notes (Signed)
Patient asked repeatedly to provide urine sample. He has already gone to the bathroom twice (walking without assistance) since 7pm, he has had 2 episodes of loose stools, I gave him the urinal and he just puked in it, second attempt he said he forgot . Patient back in his bed puking in a wall vac container. noted.

## 2023-07-22 LAB — URINALYSIS, W/ REFLEX TO CULTURE (INFECTION SUSPECTED)
Bacteria, UA: NONE SEEN
Bilirubin Urine: NEGATIVE
Glucose, UA: NEGATIVE mg/dL
Ketones, ur: 20 mg/dL — AB
Leukocytes,Ua: NEGATIVE
Nitrite: NEGATIVE
Protein, ur: 30 mg/dL — AB
Specific Gravity, Urine: 1.01 (ref 1.005–1.030)
pH: 8 (ref 5.0–8.0)

## 2023-07-22 LAB — RAPID URINE DRUG SCREEN, HOSP PERFORMED
Amphetamines: NOT DETECTED
Barbiturates: NOT DETECTED
Benzodiazepines: NOT DETECTED
Cocaine: NOT DETECTED
Opiates: NOT DETECTED
Tetrahydrocannabinol: POSITIVE — AB

## 2023-07-22 MED ORDER — LACTATED RINGERS IV BOLUS
500.0000 mL | Freq: Once | INTRAVENOUS | Status: AC
  Administered 2023-07-22: 500 mL via INTRAVENOUS

## 2023-07-22 MED ORDER — ONDANSETRON HCL 4 MG/2ML IJ SOLN
4.0000 mg | Freq: Once | INTRAMUSCULAR | Status: AC
  Administered 2023-07-22: 4 mg via INTRAVENOUS
  Filled 2023-07-22: qty 2

## 2023-07-22 NOTE — Discharge Instructions (Signed)
It is possible that you may have suffered a seizure-like episode.  We recommend outpatient recheck by a neurologist for further evaluation.  In the interim, follow-up with your primary care doctor.  Do not drive a motor vehicle or operate heavy machinery for 6 months or until cleared by a neurologist.  We also advised against the use of alcohol and illicit substances as this could precipitate symptoms.  Continue to have good glycemic control; remain compliant with your insulin and other prescribed medications.  Return precautions discussed and provided. Patient discharged in stable condition with no unaddressed concerns.

## 2023-07-22 NOTE — ED Provider Notes (Signed)
2:53 AM Patient with mild tachycardia. Will give additional IVF. C/o some persistent nausea; received Zofran and droperidol from prior provider.  In short, presenting for AMS, questionable seizure like activity. No hx of seizure disorder. No witness seizure like activity since arrival. He is a type 1 DM, but appears to have good glycemic control today. No concern for DKA. Clearing lactate may indicate prior seizure-like episode; nonspecific leukocytosis. Creatinine of 1.4 at baseline, per chart review. UDS positive for marijuana.  5:30 AM Patient up and ambulatory, tolerating PO fluids. Seen and assessed at bedside by MD Rancour who is in agreement with outpatient f/u, neurologic referral. Return precautions discussed and provided. Patient discharged in stable condition with no unaddressed concerns.   Antony Madura, PA-C 07/22/23 0530    Glynn Octave, MD 07/22/23 2005

## 2023-07-23 ENCOUNTER — Other Ambulatory Visit: Payer: Self-pay

## 2023-07-23 ENCOUNTER — Encounter: Payer: Self-pay | Admitting: Neurology

## 2023-07-30 ENCOUNTER — Telehealth: Payer: Self-pay | Admitting: Nurse Practitioner

## 2023-07-30 ENCOUNTER — Other Ambulatory Visit: Payer: Self-pay | Admitting: Nurse Practitioner

## 2023-07-30 ENCOUNTER — Other Ambulatory Visit: Payer: Self-pay

## 2023-07-30 DIAGNOSIS — E109 Type 1 diabetes mellitus without complications: Secondary | ICD-10-CM

## 2023-07-30 MED ORDER — FREESTYLE LIBRE 2 SENSOR MISC
11 refills | Status: DC
  Filled 2023-07-30: qty 2, 28d supply, fill #0

## 2023-07-30 NOTE — Telephone Encounter (Signed)
I spoke to [place] and she is working on the prior authorization for this.

## 2023-07-30 NOTE — Telephone Encounter (Signed)
Prescription Request  07/30/2023  LOV: 07/16/2023  What is the name of the medication or equipment? Freestyle libre  Have you contacted your pharmacy to request a refill? Yes   Which pharmacy would you like this sent to?  [facility]   Patient notified that their request is being sent to the clinical staff for review and that they should receive a response within 2 business days.   Please advise at [place] [phone]

## 2023-07-30 NOTE — Telephone Encounter (Signed)
Pt called stating he needed the actual freestyle libre, only the sensors. He needs a prescription for the actual machine. He says he is trying to get it thru medicaid but [facility] said that Archie Patten needs to reach out to Medicaid to see if they will cover it.    I think he is needing a prior authorization?    Can call back at [phone]

## 2023-07-31 ENCOUNTER — Telehealth: Payer: Self-pay

## 2023-07-31 ENCOUNTER — Other Ambulatory Visit: Payer: Self-pay

## 2023-07-31 NOTE — Telephone Encounter (Signed)
Pharmacy Patient Advocate Encounter   Received notification from CoverMyMeds that prior authorization for FREESTYLE LIBRE 2 SENSORS is required/requested.   Insurance verification completed.   The patient is insured through [facility] .   Per test claim: PA required; PA submitted to [facility] Medicaid via CoverMyMeds Key/confirmation #/EOC [id number] Status is pending

## 2023-07-31 NOTE — Telephone Encounter (Signed)
Pharmacy Patient Advocate Encounter  Received notification from [facility] that Prior Authorization for FREESTYLE LIBRE 2 SESORS has been APPROVED from 07/31/2023 to 01/29/2024   PA #/Case ID/Reference #: [id number]

## 2023-08-04 ENCOUNTER — Other Ambulatory Visit: Payer: Self-pay

## 2023-08-04 ENCOUNTER — Other Ambulatory Visit: Payer: Self-pay | Admitting: Nurse Practitioner

## 2023-08-04 DIAGNOSIS — E109 Type 1 diabetes mellitus without complications: Secondary | ICD-10-CM

## 2023-08-04 MED ORDER — INSULIN ASPART 100 UNIT/ML IJ SOLN
INTRAMUSCULAR | 0 refills | Status: DC
Start: 2023-08-04 — End: 2024-03-17
  Filled 2023-08-04: qty 10, 28d supply, fill #0
  Filled 2023-09-01: qty 10, 28d supply, fill #1
  Filled 2023-10-01: qty 30, 90d supply, fill #2
  Filled 2023-12-26: qty 30, 90d supply, fill #3
  Filled 2024-02-23: qty 30, 90d supply, fill #4

## 2023-08-04 MED ORDER — INSULIN GLARGINE 100 UNIT/ML ~~LOC~~ SOLN
28.0000 [IU] | Freq: Every day | SUBCUTANEOUS | 2 refills | Status: DC
  Filled 2023-08-04: qty 10, 35d supply, fill #0
  Filled 2023-09-01: qty 10, 35d supply, fill #1
  Filled 2023-10-01: qty 10, 35d supply, fill #2

## 2023-08-05 ENCOUNTER — Other Ambulatory Visit: Payer: Self-pay

## 2023-08-05 ENCOUNTER — Other Ambulatory Visit (INDEPENDENT_AMBULATORY_CARE_PROVIDER_SITE_OTHER): Payer: Medicaid Other | Admitting: Pharmacist

## 2023-08-05 DIAGNOSIS — E1010 Type 1 diabetes mellitus with ketoacidosis without coma: Secondary | ICD-10-CM

## 2023-08-05 MED ORDER — FREESTYLE LIBRE 3 PLUS SENSOR MISC
1 refills | Status: DC
  Filled 2023-08-05: qty 6, fill #0
  Filled 2023-08-05: qty 6, 84d supply, fill #0
  Filled 2023-08-05: qty 2, 28d supply, fill #0
  Filled 2023-09-01: qty 2, 28d supply, fill #1

## 2023-08-05 NOTE — Progress Notes (Signed)
08/05/2023 Name: Zachary Vazquez MRN: [id number] DOB: 25-Aug-1986  Chief Complaint  Patient presents with   Hypertension   Medication Management   Diabetes    Zachary Vazquez is a 37 y.o. year old male who presented for a telephone visit.   They were referred to the pharmacist by their PCP for assistance in managing diabetes, hypertension, and hyperlipidemia.    Subjective:  Care Team: Primary Care Provider: Ivonne Andrew, NP ; Next Scheduled Visit: 10/15/23  Medication Access/Adherence  Current Pharmacy:  [facility] [address], [address] [place] [address] Phone: [phone] Fax: [phone]   Patient reports affordability concerns with their medications: Yes  Patient reports access/transportation concerns to their pharmacy: No  Patient reports adherence concerns with their medications:  No     Diabetes:  Current medications: Lantus 25 units daily, Novolog per sliding scale - roughly based on what he is eating, estimates ~ 3-8 units with meals  Notes he has been using Novolog to correct high readings in ~ 400s, but has had hypoglycemic episodes after that, so realizes he should avoid that.   Has not picked up Libre CGM yet. Was told by [facility] it wasn't ready.   Patient reports hypoglycemic s/sx including dizziness, shakiness, sweating. 1 episode of seizure, 2 other episodes of passing out recently.  Is amenable to Endocrinology referral.   Hypertension:  Current medications: carvedilol 12.5 mg twice daily  Medications previously tried: lisinopril - AKI while hospitalized, but was dehydrated during DKA admission  Patient has a validated, automated, upper arm home BP cuff Current blood pressure readings readings: ~140s systolic, but notes that he has been taking right after drinking coffee and has not appropriately rested.   Objective:  Lab Results  Component Value Date   HGBA1C 6.0  07/16/2023    Lab Results  Component Value Date   CREATININE 1.40 (H) 07/21/2023   BUN 17 07/21/2023   NA 136 07/21/2023   K 4.0 07/21/2023   CL 100 07/21/2023   CO2 23 07/21/2023    Lab Results  Component Value Date   CHOL 190 10/12/2021   HDL 58 10/12/2021   LDLCALC 121 (H) 10/12/2021   TRIG 57 10/12/2021   CHOLHDL 3.3 10/12/2021    Medications Reviewed Today     Reviewed by Alden Hipp, RPH-CPP (Pharmacist) on 08/05/23 at 1602  Med List Status: <None>   Medication Order Taking? Sig Documenting Provider Last Dose Status Informant  acetaminophen (TYLENOL) 325 MG tablet [id number]  Take 2 tablets (650 mg total) by mouth every 6 (six) hours as needed for mild pain (or Fever >/= 101). Vonna Drafts, MD  Active Self  carvedilol (COREG) 12.5 MG tablet [id number] Yes Take 1 tablet (12.5 mg total) by mouth 2 (two) times daily with a meal. Ivonne Andrew, NP Taking Active Self  Continuous Glucose Sensor (FREESTYLE LIBRE 3 PLUS SENSOR) MISC [id number] Yes Change sensor every 14 days. Ivonne Andrew, NP Taking Active   insulin aspart (NOVOLOG) 100 UNIT/ML injection [id number] Yes Use as directed per sliding scale Ivonne Andrew, NP Taking Active            Med Note Clearance Coots, Jermale Crass T   Tue Aug 05, 2023  1:19 PM) 3-8 units with meals  insulin glargine (LANTUS) 100 UNIT/ML injection [id number] Yes Inject 0.28 mLs (28 Units total) into the skin at bedtime. Ivonne Andrew, NP Taking Active  Med Note Clearance Coots, Mellany Dinsmore T   Tue Aug 05, 2023  1:19 PM) 25 units  ondansetron (ZOFRAN-ODT) 4 MG disintegrating tablet [id number] No Take 1 tablet (4 mg total) by mouth every 8 (eight) hours as needed for nausea or vomiting.  Patient not taking: Reported on 08/05/2023   Maurilio Lovely D, DO Not Taking Active Self  pantoprazole (PROTONIX) 40 MG tablet [id number] Yes Take 1 tablet (40 mg total) by mouth daily. Zehr, Princella Pellegrini, PA-C Taking Active Self  polyethylene glycol powder  (GLYCOLAX/MIRALAX) 17 GM/SCOOP powder [id number]  Mix 1capful (17 g) with 4-8 ounces with water/juice by mouth 2 (two) times daily as needed. Ivonne Andrew, NP  Active Self              Assessment/Plan:   Diabetes: - Currently uncontrolled due to episodes of hypoglycemia.  - Contacted pharmacy. [facility] had Libre 2 filled, so asked them to back out this script, Libre 3 plus filled at [facility]. They will order and fill tomorrow. Patient aware to download [place] 3 app and will call me if unable to set up himself.  - Recommend to continue current regimen, will adjust once glucose readings are available.  - Collaborated with Endocrinology to have referral reopened and patient scheduled in December.   Hypertension: - Currently uncontrolled at home, but with inappropriate technique.  - Reviewed appropriate blood pressure monitoring technique and reviewed goal blood pressure. Recommended to check home blood pressure and heart rate daily, document, and provide at future visits.  - Recommend to continue carvedilol 12.5 mg twice daily at this time    Follow Up Plan: phone call next week  Catie Eppie Gibson, PharmD, BCACP, CPP Clinical Pharmacist [facility] [phone]

## 2023-08-05 NOTE — Patient Instructions (Addendum)
Lemar Lofty the [place] 3 app on your phone. Here is the website to walk you through how to set it up, but please call me if you have questions or concerns: https://[email or web address].[email or web address]/us-en/how-to-set-up.[email or web address]  Check your blood pressure twice weekly, and any time you have concerning symptoms like headache, chest pain, dizziness, shortness of breath, or vision changes.   Our goal is less than 130/80.  To appropriately check your blood pressure, make sure you do the following:  1) Avoid caffeine, exercise, or tobacco products for 30 minutes before checking. Empty your bladder. 2) Sit with your back supported in a flat-backed chair. Rest your arm on something flat (arm of the chair, table, etc). 3) Sit still with your feet flat on the floor, resting, for at least 5 minutes.  4) Check your blood pressure. Take 1-2 readings.  5) Write down these readings and bring with you to any provider appointments.  Bring your home blood pressure machine with you to a provider's office for accuracy comparison at least once a year.   Make sure you take your blood pressure medications before you come to any office visit, even if you were asked to fast for labs.    Please call with any questions or concerns.   Catie Clearance Coots, PharmD [phone]

## 2023-08-06 ENCOUNTER — Other Ambulatory Visit: Payer: Self-pay

## 2023-08-07 ENCOUNTER — Other Ambulatory Visit: Payer: Self-pay

## 2023-08-12 ENCOUNTER — Other Ambulatory Visit: Payer: Self-pay

## 2023-08-13 ENCOUNTER — Other Ambulatory Visit: Payer: Self-pay

## 2023-08-14 ENCOUNTER — Other Ambulatory Visit: Payer: Self-pay

## 2023-08-14 ENCOUNTER — Other Ambulatory Visit: Payer: Medicaid Other | Admitting: Pharmacist

## 2023-08-14 DIAGNOSIS — E109 Type 1 diabetes mellitus without complications: Secondary | ICD-10-CM

## 2023-08-14 NOTE — Progress Notes (Signed)
Care Coordination Call  Spoke with patient. Walked through connecting to [facility]. Discussed use of Libre. Scheduled follow up in 4 weeks.   Catie Eppie Gibson, PharmD, BCACP, CPP Clinical Pharmacist [facility] [phone]

## 2023-08-21 ENCOUNTER — Ambulatory Visit: Payer: Medicaid Other | Admitting: Gastroenterology

## 2023-09-01 ENCOUNTER — Other Ambulatory Visit: Payer: Self-pay

## 2023-09-03 ENCOUNTER — Ambulatory Visit: Payer: Medicaid Other | Admitting: Neurology

## 2023-09-03 ENCOUNTER — Other Ambulatory Visit: Payer: Self-pay

## 2023-09-11 ENCOUNTER — Other Ambulatory Visit: Payer: Medicaid Other | Admitting: Pharmacist

## 2023-09-11 ENCOUNTER — Other Ambulatory Visit: Payer: Self-pay

## 2023-09-11 DIAGNOSIS — E1010 Type 1 diabetes mellitus with ketoacidosis without coma: Secondary | ICD-10-CM

## 2023-09-11 MED ORDER — FREESTYLE LIBRE 3 PLUS SENSOR MISC
1 refills | Status: DC
  Filled 2023-09-11: qty 6, fill #0
  Filled 2023-10-01: qty 6, 90d supply, fill #0
  Filled 2023-12-26: qty 6, 90d supply, fill #1

## 2023-09-11 NOTE — Patient Instructions (Addendum)
Hi Hel[place],   Pleasure speaking with you today!  Please be sure to document your meals and insulin units injected in the FreeStyle app. Also, inject your Novolog (mealtime insulin) before meals.   Labs for endocrinology appointment: 10/06/2023 9:30 AM  Endocrinologist Dr. Roosevelt Locks; Next Scheduled Visit: 10/13/2023 at 9 am   Thank you!

## 2023-09-11 NOTE — Progress Notes (Signed)
09/11/2023 Name: Zachary Vazquez MRN: [id number] DOB: Jul 12, 1986  Chief Complaint  Patient presents with   Diabetes    Zachary Vazquez is a 37 y.o. year old male who presented for a telephone visit.   They were referred to the pharmacist by their PCP for assistance in managing type 1 diabetes, hypertension, and hyperlipidemia.    Subjective:  Care Team: Primary Care Provider: Ivonne Andrew, NP ; Next Scheduled Visit: 10/15/2023 Endocrinologist Dr. Roosevelt Locks; Next Scheduled Visit: 10/13/2023  Medication Access/Adherence  Current Pharmacy:  [facility] [address], [address] [place] [address] Phone: [phone] Fax: [phone]   Type 1 Diabetes:  Current medications:  Lantus 25 units daily, Novolog per sliding scale - roughly based on what he is eating, estimates ~ 3-8 units with meals  - Decides how much Novolog to inject based   [place] 3 - August 29, 2023 - September 11, 2023 % Time CGM is active: 98% Average Glucose: 186 mg/dL Glucose Management Indicator: 7.8%  Glucose Variability: 40.2% (goal <36%) Time in Goal:  - Time in range 70-180: 47% - Time above range: 50% - Time below range: 3% Observed patterns:   Patient reports hypoglycemic s/sx including dizziness, shakiness, sweating with 40s sometimes but not often. Patient denies hyperglycemic symptoms including polyuria, polydipsia, polyphagia, nocturia, neuropathy, blurred vision.  Current meal patterns (3 meals/day or smaller meals):  - Breakfast: on days that he doesn't work - sausage and egg biscuit, biscuits and gravy  - Lunch: ham sandwich and chips  - Supper: spaghetti, hamburgers  - Snacks: granola bars, cereals bars, wants to try protein shakes  - Drinks: water, coffee with creamer, soda sometimes with dinner   Current physical activity: doesn't right now. Walks up and down steps at work a lot   Hypertension:  Current  medications: carvedilol 12.5 mg twice daily at this time   Patient has a validated, automated, upper arm home BP cuff  Hyperlipidemia/ASCVD Risk Reduction  Current lipid lowering medications: none  ASCVD History: none Family History: HTN (mother) Risk Factors: HTN, tobacco use  The ASCVD Risk score (Arnett DK, et al., 2019) failed to calculate for the following reasons:   The 2019 ASCVD risk score is only valid for ages 80 to 22   PREVENT Risk Score: 10 year risk of CVD: 6.1% - 10 year risk of ASCVD: 3.8% - 10 year risk of HF: 3.3%   Objective:  Lab Results  Component Value Date   HGBA1C 6.0 07/16/2023    Lab Results  Component Value Date   CREATININE 1.40 (H) 07/21/2023   BUN 17 07/21/2023   NA 136 07/21/2023   K 4.0 07/21/2023   CL 100 07/21/2023   CO2 23 07/21/2023    Lab Results  Component Value Date   CHOL 190 10/12/2021   HDL 58 10/12/2021   LDLCALC 121 (H) 10/12/2021   TRIG 57 10/12/2021   CHOLHDL 3.3 10/12/2021    Medications Reviewed Today     Reviewed by Roslyn Smiling, [facility] (Pharmacist) on 09/11/23 at 1545  Med List Status: <None>   Medication Order Taking? Sig Documenting Provider Last Dose Status Informant  acetaminophen (TYLENOL) 325 MG tablet [id number] Yes Take 2 tablets (650 mg total) by mouth every 6 (six) hours as needed for mild pain (or Fever >/= 101). Vonna Drafts, MD Taking Active Self           Med Note Cornelius Moras, [name]   Thu  Sep 11, 2023  3:44 PM) For headaches  carvedilol (COREG) 12.5 MG tablet [id number]  Take 1 tablet (12.5 mg total) by mouth 2 (two) times daily with a meal. Ivonne Andrew, NP  Active Self  Continuous Glucose Sensor (FREESTYLE LIBRE 3 PLUS SENSOR) MISC [id number]  Change sensor every 14 days. Ivonne Andrew, NP  Active   insulin aspart (NOVOLOG) 100 UNIT/ML injection [id number] Yes Use as directed per sliding scale Ivonne Andrew, NP Taking Active            Med Note Clearance Coots, Clova Morlock T   Tue Aug 05, 2023  1:19 PM)  3-8 units with meals  insulin glargine (LANTUS) 100 UNIT/ML injection [id number] Yes Inject 0.28 mLs (28 Units total) into the skin at bedtime. Ivonne Andrew, NP Taking Active            Med Note Clearance Coots, Mersadie Kavanaugh T   Tue Aug 05, 2023  1:19 PM) 25 units  ondansetron (ZOFRAN-ODT) 4 MG disintegrating tablet [id number] No Take 1 tablet (4 mg total) by mouth every 8 (eight) hours as needed for nausea or vomiting.  Patient not taking: Reported on 08/05/2023   Maurilio Lovely D, DO Not Taking Active Self  pantoprazole (PROTONIX) 40 MG tablet [id number] Yes Take 1 tablet (40 mg total) by mouth daily. Zehr, Princella Pellegrini, PA-C Taking Active Self  polyethylene glycol powder (GLYCOLAX/MIRALAX) 17 GM/SCOOP powder [id number] Yes Mix 1capful (17 g) with 4-8 ounces with water/juice by mouth 2 (two) times daily as needed. Ivonne Andrew, NP Taking Active Self            Assessment/Plan:   Diabetes: - Currently uncontrolled - Reviewed long term cardiovascular and renal outcomes of uncontrolled blood sugar - Reviewed goal A1c, goal fasting, and goal 2 hour post prandial glucose - Reviewed lifestyle modifications including: increase exercise by walking if possible  - Recommend to continue current regimen of Lantus 25 units daily and Novolog per sliding scale  - Recommend to check glucose using CGM. Advised documenting insulin + insulin units and meals for endocrinology appointment in December  - Emphasized the importance of going to endocrinology appointment  - Recommended patient take Novolog before eating rather than during or after meals - Assisted in sending in prescription for 3 plus sensors   Follow Up Plan: Endocrinology and PCP  Roslyn Smiling, PharmD PGY1 Pharmacy Resident 09/11/2023 4:04 PM   I have reviewed the pharmacist's encounter and agree with their documentation.   Catie Eppie Gibson, PharmD, BCACP, CPP [facility] [phone]

## 2023-09-12 ENCOUNTER — Ambulatory Visit: Payer: Medicaid Other | Admitting: Gastroenterology

## 2023-09-30 ENCOUNTER — Other Ambulatory Visit: Payer: Self-pay

## 2023-09-30 DIAGNOSIS — E1010 Type 1 diabetes mellitus with ketoacidosis without coma: Secondary | ICD-10-CM

## 2023-10-01 ENCOUNTER — Other Ambulatory Visit: Payer: Self-pay

## 2023-10-03 ENCOUNTER — Other Ambulatory Visit: Payer: Self-pay

## 2023-10-06 ENCOUNTER — Other Ambulatory Visit: Payer: Medicaid Other

## 2023-10-07 ENCOUNTER — Ambulatory Visit: Payer: Medicaid Other | Admitting: Gastroenterology

## 2023-10-07 LAB — HEMOGLOBIN A1C
Hgb A1c MFr Bld: 7.4 %{Hb} — ABNORMAL HIGH (ref <5.7)
Mean Plasma Glucose: 166 mg/dL
eAG (mmol/L): 9.2 mmol/L

## 2023-10-13 ENCOUNTER — Ambulatory Visit (INDEPENDENT_AMBULATORY_CARE_PROVIDER_SITE_OTHER): Payer: Medicaid Other | Admitting: "Endocrinology

## 2023-10-13 ENCOUNTER — Other Ambulatory Visit: Payer: Self-pay

## 2023-10-13 ENCOUNTER — Encounter: Payer: Self-pay | Admitting: "Endocrinology

## 2023-10-13 VITALS — BP 128/80 | HR 84 | Ht 72.0 in | Wt 180.4 lb

## 2023-10-13 DIAGNOSIS — E1065 Type 1 diabetes mellitus with hyperglycemia: Secondary | ICD-10-CM | POA: Diagnosis not present

## 2023-10-13 DIAGNOSIS — G629 Polyneuropathy, unspecified: Secondary | ICD-10-CM | POA: Diagnosis not present

## 2023-10-13 MED ORDER — BAQSIMI ONE PACK 3 MG/DOSE NA POWD
1.0000 | NASAL | 3 refills | Status: AC | PRN
  Filled 2023-10-13: qty 2, 2d supply, fill #0

## 2023-10-13 NOTE — Patient Instructions (Signed)
Lantus 30 units at bedtime Novolog 4 units tidac + sliding scale 1:50>150, three times a day before each meal   ______________    Goals of DM therapy:  Morning Fasting blood sugar: 80-140  Blood sugar before meals: 80-140 Bed time blood sugar: 100-150  A1C <7%, limited only by hypoglycemia  1.Diabetes medications and their side effects discussed, including hypoglycemia    2. Check blood glucose:  a) Always check blood sugars before driving. Please see below (under hypoglycemia) on how to manage b) Check a minimum of 3 times/day or more as needed when having symptoms of hypoglycemia.   c) Try to check blood glucose before sleeping/in the middle of the night to ensure that it is remaining stable and not dropping less than 100 d) Check blood glucose more often if sick  3. Diet: a) 3 meals per day schedule b: Restrict carbs to 60-70 grams (4 servings) per meal c) Colorful vegetables - 3 servings a day, and low sugar fruit 2 servings/day Plate control method: 1/4 plate protein, 1/4 starch, 1/2 green, yellow, or red vegetables d) Avoid carbohydrate snacks unless hypoglycemic episode, or increased physical activity  4. Regular exercise as tolerated, preferably 3 or more hours a week  5. Hypoglycemia: a)  Do not drive or operate machinery without first testing blood glucose to assure it is over 90 mg%, or if dizzy, lightheaded, not feeling normal, etc, or  if foot or leg is numb or weak. b)  If blood glucose less than 70, take four 5gm Glucose tabs or 15-30 gm Glucose gel.  Repeat every 15 min as needed until blood sugar is >100 mg/dl. If hypoglycemia persists then call 911.   6. Sick day management: a) Check blood glucose more often b) Continue usual therapy if blood sugars are elevated.   7. Contact the doctor immediately if blood glucose is frequently <60 mg/dl, or an episode of severe hypoglycemia occurs (where someone had to give you glucose/  glucagon or if you passed out from  a low blood glucose), or if blood glucose is persistently >350 mg/dl, for further management  8. A change in level of physical activity or exercise and a change in diet may also affect your blood sugar. Check blood sugars more often and call if needed.  Instructions: 1. Bring glucose meter, blood glucose records on every visit for review 2. Continue to follow up with primary care physician and other providers for medical care 3. Yearly eye  and foot exam 4. Please get blood work done prior to the next appointment

## 2023-10-13 NOTE — Progress Notes (Signed)
Outpatient Endocrinology Note Zachary Crozet, MD  10/13/23   Zachary Vazquez November 13, 1985 [id number]  Referring Provider: Ivonne Andrew, NP Primary Care Provider: Ivonne Andrew, NP Reason for consultation: Subjective   Assessment & Plan  Diagnoses and all orders for this visit:  Uncontrolled type 1 diabetes mellitus with hyperglycemia (HCC)  Neuropathy  Other orders -     Glucagon (BAQSIMI ONE PACK) 3 MG/DOSE POWD; Place 1 Device into the nose as needed (Low blood sugar with impaired consciousness).    Diabetes Type I complicated by neuropathy, No results found for: "GFR" Hba1c goal less than 7, current Hba1c is  Lab Results  Component Value Date   HGBA1C 7.4 (H) 10/06/2023   Will recommend the following: Lantus 30 units at bedtime Novolog 4 units tidac + sliding scale 1:50>150, three times a day before each meal at least 4 hours apart Patient reports he is not interested in following any recommendations and does not intend to follow it  No known contraindications/side effects to any of above medications Glucagon discussed and prescribed with refills on 10/13/23   -Last LD and Tg are as follows: Lab Results  Component Value Date   LDLCALC 121 (H) 10/12/2021    Lab Results  Component Value Date   TRIG 57 10/12/2021   -not on statin  -Follow low fat diet and exercise   -Blood pressure goal <140/90 - Microalbumin/creatinine goal is < 30 -Last MA/Cr is as follows: No results found for: "MICROALBUR", "MALB24HUR" -not on ACE/ARB  -diet changes including salt restriction -limit eating outside -counseled BP targets per standards of diabetes care -uncontrolled blood pressure can lead to retinopathy, nephropathy and cardiovascular and atherosclerotic heart disease  Reviewed and counseled on: -A1C target -Blood sugar targets -Complications of uncontrolled diabetes  -Checking blood sugar before meals and bedtime and bring log next visit -All  medications with mechanism of action and side effects -Hypoglycemia management: rule of 15's, Glucagon Emergency Kit and medical alert ID -low-carb low-fat plate-method diet -At least 20 minutes of physical activity per day -Annual dilated retinal eye exam and foot exam -compliance and follow up needs -follow up as scheduled or earlier if problem gets worse  Call if blood sugar is less than 70 or consistently above 250    Take a 15 gm snack of carbohydrate at bedtime before you go to sleep if your blood sugar is less than 100.    If you are going to fast after midnight for a test or procedure, ask your physician for instructions on how to reduce/decrease your insulin dose.    Call if blood sugar is less than 70 or consistently above 250  -Treating a low sugar by rule of 15  (15 gms of sugar every 15 min until sugar is more than 70) If you feel your sugar is low, test your sugar to be sure If your sugar is low (less than 70), then take 15 grams of a fast acting Carbohydrate (3-4 glucose tablets or glucose gel or 4 ounces of juice or regular soda) Recheck your sugar 15 min after treating low to make sure it is more than 70 If sugar is still less than 70, treat again with 15 grams of carbohydrate          Don't drive the hour of hypoglycemia  If unconscious/unable to eat or drink by mouth, use glucagon injection or nasal spray baqsimi and call 911. Can repeat again in 15 min if still unconscious.  Return in about 4 weeks (around 11/10/2023).   I have reviewed current medications, nurse's notes, allergies, vital signs, past medical and surgical history, family medical history, and social history for this encounter. Counseled patient on symptoms, examination findings, lab findings, imaging results, treatment decisions and monitoring and prognosis. The patient understood the recommendations and agrees with the treatment plan. All questions regarding treatment plan were fully answered.  Zachary [place], MD  10/13/23    History of Present Illness Zachary Vazquez is a 37 y.o. year old male who presents for evaluation of Type I diabetes mellitus.  Aliyan Grizzle was first diagnosed at age 37. Diabetes education -  Home diabetes regimen: Lantus 30 units at bedtime Novolog 0-10 units before/after meals 1-6 time a day  COMPLICATIONS -  MI/Stroke -  retinopathy +  neuropathy -  nephropathy  SYMPTOMS REVIEWED + Polyuria - Weight loss - Blurred vision  BLOOD SUGAR DATA  CGM interpretation: At today's visit, we reviewed her CGM downloads. The full report is scanned in the media. Reviewing the CGM trends, BG are low across the day, which highs overnight.    Physical Exam  BP 128/80   Pulse 84   Ht 6' (1.829 m)   Wt 180 lb 6.4 oz (81.8 kg)   SpO2 99%   BMI 24.47 kg/m    Constitutional: well developed, well nourished Head: normocephalic, atraumatic Eyes: sclera anicteric, no redness Neck: supple Lungs: normal respiratory effort Neurology: alert and oriented Skin: dry, no appreciable rashes Musculoskeletal: no appreciable defects Psychiatric: normal mood and affect Diabetic Foot Exam - Simple   No data filed      Current Medications Patient's Medications  New Prescriptions   GLUCAGON (BAQSIMI ONE PACK) 3 MG/DOSE POWD    Place 1 Device into the nose as needed (Low blood sugar with impaired consciousness).  Previous Medications   ACETAMINOPHEN (TYLENOL) 325 MG TABLET    Take 2 tablets (650 mg total) by mouth every 6 (six) hours as needed for mild pain (or Fever >/= 101).   CARVEDILOL (COREG) 12.5 MG TABLET    Take 1 tablet (12.5 mg total) by mouth 2 (two) times daily with a meal.   CONTINUOUS GLUCOSE SENSOR (FREESTYLE LIBRE 3 PLUS SENSOR) MISC    Change sensor every 15 days.   INSULIN ASPART (NOVOLOG) 100 UNIT/ML INJECTION    Use as directed per sliding scale   INSULIN GLARGINE (LANTUS) 100 UNIT/ML INJECTION    Inject 0.28 mLs (28 Units total) into  the skin at bedtime.   ONDANSETRON (ZOFRAN-ODT) 4 MG DISINTEGRATING TABLET    Take 1 tablet (4 mg total) by mouth every 8 (eight) hours as needed for nausea or vomiting.   PANTOPRAZOLE (PROTONIX) 40 MG TABLET    Take 1 tablet (40 mg total) by mouth daily.   POLYETHYLENE GLYCOL POWDER (GLYCOLAX/MIRALAX) 17 GM/SCOOP POWDER    Mix 1capful (17 g) with 4-8 ounces with water/juice by mouth 2 (two) times daily as needed.  Modified Medications   No medications on file  Discontinued Medications   No medications on file    Allergies Allergies  Allergen Reactions   Clindamycin/Lincomycin Anaphylaxis   Bactrim [Sulfamethoxazole-Trimethoprim] Swelling and Other (See Comments)    Eyes swell and bumps appear on/near the eyes     Past Medical History Past Medical History:  Diagnosis Date   Cannabis hyperemesis syndrome concurrent with and due to cannabis abuse (HCC)    History of delayed wound healing 10/2019   Tetrahydrocannabinol (  THC) use disorder, severe, dependence (HCC)    Type 1 diabetes mellitus (HCC)    Vitamin D deficiency 09/2020    Past Surgical History Past Surgical History:  Procedure Laterality Date   TONSILLECTOMY      Family History family history includes Colon cancer in his paternal grandmother; Diabetes Mellitus I in his brother; Hypertension in his mother.  Social History Social History   Socioeconomic History   Marital status: Single    Spouse name: Not on file   Number of children: Not on file   Years of education: Not on file   Highest education level: Not on file  Occupational History   Not on file  Tobacco Use   Smoking status: Every Day    Current packs/day: 1.00    Average packs/day: 1 pack/day for 15.0 years (15.0 ttl pk-yrs)    Types: Cigarettes   Smokeless tobacco: Never  Vaping Use   Vaping status: Never Used  Substance and Sexual Activity   Alcohol use: Yes    Comment: occ   Drug use: Yes    Types: Marijuana    Comment: occ   Sexual  activity: Yes    Birth control/protection: None  Other Topics Concern   Not on file  Social History Narrative   Not on file   Social Drivers of Health   Financial Resource Strain: Not on file  Food Insecurity: No Food Insecurity (05/17/2023)   Hunger Vital Sign    Worried About Running Out of Food in the Last Year: Never true    Ran Out of Food in the Last Year: Never true  Transportation Needs: No Transportation Needs (05/17/2023)   PRAPARE - Administrator, Civil Service (Medical): No    Lack of Transportation (Non-Medical): No  Physical Activity: Not on file  Stress: Not on file  Social Connections: Not on file  Intimate Partner Violence: Not At Risk (05/17/2023)   Humiliation, Afraid, Rape, and Kick questionnaire    Fear of Current or Ex-Partner: No    Emotionally Abused: No    Physically Abused: No    Sexually Abused: No    Lab Results  Component Value Date   HGBA1C 7.4 (H) 10/06/2023   HGBA1C 6.0 07/16/2023   HGBA1C 6.4 (A) 04/14/2023   Lab Results  Component Value Date   CHOL 190 10/12/2021   Lab Results  Component Value Date   HDL 58 10/12/2021   Lab Results  Component Value Date   LDLCALC 121 (H) 10/12/2021   Lab Results  Component Value Date   TRIG 57 10/12/2021   Lab Results  Component Value Date   CHOLHDL 3.3 10/12/2021   Lab Results  Component Value Date   CREATININE 1.40 (H) 07/21/2023   No results found for: "GFR" No results found for: "MICROALBUR", "MALB24HUR"    Component Value Date/Time   NA 136 07/21/2023 1845   NA 143 07/16/2023 1605   K 4.0 07/21/2023 1845   CL 100 07/21/2023 1845   CO2 23 07/21/2023 1845   GLUCOSE 181 (H) 07/21/2023 1845   BUN 17 07/21/2023 1845   BUN 15 07/16/2023 1605   CREATININE 1.40 (H) 07/21/2023 1845   CALCIUM 8.9 07/21/2023 1845   PROT 6.4 (L) 07/21/2023 1845   PROT 6.3 07/16/2023 1605   ALBUMIN 3.7 07/21/2023 1845   ALBUMIN 4.4 07/16/2023 1605   AST 27 07/21/2023 1845   ALT 24  07/21/2023 1845   ALKPHOS 45 07/21/2023 1845   BILITOT  0.5 07/21/2023 1845   BILITOT 0.3 07/16/2023 1605   GFRNONAA >60 07/21/2023 1845   GFRAA 82 10/11/2020 1444      Latest Ref Rng & Units 07/21/2023    6:45 PM 07/16/2023    4:05 PM 06/02/2023    1:39 AM  BMP  Glucose 70 - 99 mg/dL 601  093  235   BUN 6 - 20 mg/dL 17  15  16    Creatinine 0.61 - 1.24 mg/dL 5.73  2.20  2.54   BUN/Creat Ratio 9 - 20  13    Sodium 135 - 145 mmol/L 136  143  137   Potassium 3.5 - 5.1 mmol/L 4.0  4.4  4.5   Chloride 98 - 111 mmol/L 100  106  102   CO2 22 - 32 mmol/L 23  20    Calcium 8.9 - 10.3 mg/dL 8.9  9.5         Component Value Date/Time   WBC 12.4 (H) 07/21/2023 1845   RBC 4.32 07/21/2023 1845   HGB 13.4 07/21/2023 1845   HGB 13.8 07/16/2023 1605   HCT 40.4 07/21/2023 1845   HCT 40.2 07/16/2023 1605   PLT 255 07/21/2023 1845   PLT 259 07/16/2023 1605   MCV 93.5 07/21/2023 1845   MCV 94 07/16/2023 1605   MCH 31.0 07/21/2023 1845   MCHC 33.2 07/21/2023 1845   RDW 12.3 07/21/2023 1845   RDW 12.2 07/16/2023 1605   LYMPHSABS 2.0 07/21/2023 1845   LYMPHSABS 2.0 07/13/2021 1158   MONOABS 0.6 07/21/2023 1845   EOSABS 0.2 07/21/2023 1845   EOSABS 0.5 (H) 07/13/2021 1158   BASOSABS 0.1 07/21/2023 1845   BASOSABS 0.1 07/13/2021 1158     Parts of this note may have been dictated using voice recognition software. There may be variances in spelling and vocabulary which are unintentional. Not all errors are proofread. Please notify the There[facility] Parkin if any discrepancies are noted or if the meaning of any statement is not clear.

## 2023-10-15 ENCOUNTER — Encounter: Payer: Self-pay | Admitting: Nurse Practitioner

## 2023-10-15 ENCOUNTER — Other Ambulatory Visit: Payer: Self-pay

## 2023-10-15 ENCOUNTER — Ambulatory Visit: Payer: Medicaid Other | Admitting: Nurse Practitioner

## 2023-10-15 VITALS — BP 137/100 | HR 88 | Temp 98.8°F | Wt 180.3 lb

## 2023-10-15 DIAGNOSIS — E1010 Type 1 diabetes mellitus with ketoacidosis without coma: Secondary | ICD-10-CM | POA: Diagnosis not present

## 2023-10-15 DIAGNOSIS — I152 Hypertension secondary to endocrine disorders: Secondary | ICD-10-CM | POA: Diagnosis not present

## 2023-10-15 MED ORDER — CARVEDILOL 25 MG PO TABS
25.0000 mg | ORAL_TABLET | Freq: Two times a day (BID) | ORAL | 3 refills | Status: DC
Start: 2023-10-15 — End: 2023-12-26
  Filled 2023-10-15: qty 60, 30d supply, fill #0

## 2023-10-15 NOTE — Progress Notes (Unsigned)
Subjective   Patient ID: Zachary Vazquez, male    DOB: June 26, 1986, 37 y.o.   MRN: [id number]  Chief Complaint  Patient presents with   Diabetes    Referring provider: Ivonne Andrew, NP  Zachary Vazquez is a 37 y.o. male with Past Medical History: No date: Cannabis hyperemesis syndrome concurrent with and due to  cannabis abuse (HCC) 10/2019: History of delayed wound healing No date: Tetrahydrocannabinol [facility]) use disorder, severe, dependence  (HCC) No date: Type 1 diabetes mellitus (HCC) 09/2020: Vitamin D deficiency   HPI  Patient presents for follow-up visit.  He has followed with endocrinology for type 1 diabetes since his last visit here.  A1c is stable in office today.  Patient states that he was unhappy with his endocrinology visit and would like a referral to a different endocrinologist.  Will place orders today.  We do have pharmacy fill on him for medication management as well. Denies f/c/s, n/v/d, hemoptysis, PND, leg swelling Denies chest pain or edema      Allergies  Allergen Reactions   Clindamycin/Lincomycin Anaphylaxis   Bactrim [Sulfamethoxazole-Trimethoprim] Swelling and Other (See Comments)    Eyes swell and bumps appear on/near the eyes     Immunization History  Administered Date(s) Administered   Tdap 12/15/2019    Tobacco History: Social History   Tobacco Use  Smoking Status Every Day   Current packs/day: 1.00   Average packs/day: 1 pack/day for 15.0 years (15.0 ttl pk-yrs)   Types: Cigarettes  Smokeless Tobacco Never   Ready to quit: No Counseling given: Yes   Outpatient Encounter Medications as of 10/15/2023  Medication Sig   acetaminophen (TYLENOL) 325 MG tablet Take 2 tablets (650 mg total) by mouth every 6 (six) hours as needed for mild pain (or Fever >/= 101).   carvedilol (COREG) 25 MG tablet Take 1 tablet (25 mg total) by mouth 2 (two) times daily with a meal.   Continuous Glucose Sensor (FREESTYLE LIBRE 3 PLUS  SENSOR) MISC Change sensor every 15 days.   insulin aspart (NOVOLOG) 100 UNIT/ML injection Use as directed per sliding scale   insulin glargine (LANTUS) 100 UNIT/ML injection Inject 0.28 mLs (28 Units total) into the skin at bedtime.   ondansetron (ZOFRAN-ODT) 4 MG disintegrating tablet Take 1 tablet (4 mg total) by mouth every 8 (eight) hours as needed for nausea or vomiting.   pantoprazole (PROTONIX) 40 MG tablet Take 1 tablet (40 mg total) by mouth daily.   polyethylene glycol powder (GLYCOLAX/MIRALAX) 17 GM/SCOOP powder Mix 1capful (17 g) with 4-8 ounces with water/juice by mouth 2 (two) times daily as needed.   [DISCONTINUED] carvedilol (COREG) 12.5 MG tablet Take 1 tablet (12.5 mg total) by mouth 2 (two) times daily with a meal.   Glucagon (BAQSIMI ONE PACK) 3 MG/DOSE POWD Place 1 Device into the nose as needed (Low blood sugar with impaired consciousness). (Patient not taking: Reported on 10/15/2023)   No facility-administered encounter medications on file as of 10/15/2023.    Review of Systems  Review of Systems  Constitutional: Negative.   HENT: Negative.    Cardiovascular: Negative.   Gastrointestinal: Negative.   Allergic/Immunologic: Negative.   Neurological: Negative.   Psychiatric/Behavioral: Negative.       Objective:   BP (!) 137/100   Pulse 88   Temp 98.8 F (37.1 C)   Wt 180 lb 4.8 oz (81.8 kg)   SpO2 100%   BMI 24.45 kg/m   Wt Readings from Last 5  Encounters:  10/15/23 180 lb 4.8 oz (81.8 kg)  10/13/23 180 lb 6.4 oz (81.8 kg)  07/21/23 200 lb (90.7 kg)  07/16/23 171 lb 3.2 oz (77.7 kg)  06/13/23 161 lb (73 kg)     Physical Exam Vitals and nursing note reviewed.  Constitutional:      General: He is not in acute distress.    Appearance: He is well-developed.  Cardiovascular:     Rate and Rhythm: Normal rate and regular rhythm.  Pulmonary:     Effort: Pulmonary effort is normal.     Breath sounds: Normal breath sounds.  Skin:    General: Skin is  warm and dry.  Neurological:     Mental Status: He is alert and oriented to person, place, and time.       Assessment & Plan:   Uncontrolled type 1 diabetes mellitus with ketoacidosis without coma, with long-term current use of insulin (HCC) -     POCT glycosylated hemoglobin (Hb A1C) -     Ambulatory referral to Endocrinology -     Comprehensive metabolic panel -     CBC  Hypertension associated with diabetes (HCC) -     Carvedilol; Take 1 tablet (25 mg total) by mouth 2 (two) times daily with a meal.  Dispense: 60 tablet; Refill: 3     Return in about 2 weeks (around 10/29/2023) for Nurse Visit - recheck BP.     Ivonne Andrew, NP 10/16/2023

## 2023-10-15 NOTE — Patient Instructions (Addendum)
1. Uncontrolled type 1 diabetes mellitus with ketoacidosis without coma, with long-term current use of insulin (HCC) (Primary)  - POCT glycosylated hemoglobin (Hb A1C) - Ambulatory referral to Endocrinology    Follow up:  Follow up in 3 months

## 2023-10-16 LAB — POCT GLYCOSYLATED HEMOGLOBIN (HGB A1C): Hemoglobin A1C: 5.5 % (ref 4.0–5.6)

## 2023-10-20 ENCOUNTER — Other Ambulatory Visit: Payer: Self-pay

## 2023-10-24 ENCOUNTER — Other Ambulatory Visit: Payer: Self-pay

## 2023-10-24 MED ORDER — MAGNESIUM CHLORIDE 64 MG PO TABS: ORAL_TABLET | ORAL | 0 refills | Status: AC

## 2023-10-24 MED ORDER — CARVEDILOL 25 MG PO TABS
25.0000 mg | ORAL_TABLET | Freq: Two times a day (BID) | ORAL | 0 refills | Status: DC
  Filled 2023-10-24 – 2023-11-24 (×2): qty 60, 30d supply, fill #0

## 2023-10-30 ENCOUNTER — Ambulatory Visit (INDEPENDENT_AMBULATORY_CARE_PROVIDER_SITE_OTHER): Payer: Medicaid Other

## 2023-10-30 ENCOUNTER — Other Ambulatory Visit: Payer: Self-pay

## 2023-10-30 ENCOUNTER — Other Ambulatory Visit: Payer: Self-pay | Admitting: Nurse Practitioner

## 2023-10-30 VITALS — BP 147/91 | HR 88 | Ht 71.0 in | Wt 180.0 lb

## 2023-10-30 DIAGNOSIS — I152 Hypertension secondary to endocrine disorders: Secondary | ICD-10-CM | POA: Diagnosis not present

## 2023-10-30 DIAGNOSIS — E1159 Type 2 diabetes mellitus with other circulatory complications: Secondary | ICD-10-CM

## 2023-10-30 MED ORDER — VALSARTAN 80 MG PO TABS
80.0000 mg | ORAL_TABLET | Freq: Every day | ORAL | 11 refills | Status: DC
  Filled 2023-10-30: qty 30, 30d supply, fill #0
  Filled 2023-11-24: qty 30, 30d supply, fill #1
  Filled 2023-12-26: qty 30, 30d supply, fill #2

## 2023-10-30 NOTE — Progress Notes (Signed)
   Blood Pressure Recheck Visit  Name: Jakeem Grape MRN: [id number] Date of Birth: 1986-10-06  Cobain Morici presents today for Blood Pressure recheck with clinical support staff.  Order for BP recheck by [name] FNP, ordered on 10/15/23.   BP Readings from Last 3 Encounters:  10/15/23 (!) 137/100  10/13/23 128/80  07/22/23 131/88    Current Outpatient Medications  Medication Sig Dispense Refill   acetaminophen  (TYLENOL ) 325 MG tablet Take 2 tablets (650 mg total) by mouth every 6 (six) hours as needed for mild pain (or Fever >/= 101).     carvedilol  (COREG ) 25 MG tablet Take 1 tablet (25 mg total) by mouth 2 (two) times daily with a meal. 60 tablet 3   Continuous Glucose Sensor (FREESTYLE LIBRE 3 PLUS SENSOR) MISC Change sensor every 15 days. 6 each 1   insulin  aspart (NOVOLOG ) 100 UNIT/ML injection Use as directed per sliding scale 230 mL 0   insulin  glargine (LANTUS ) 100 UNIT/ML injection Inject 0.28 mLs (28 Units total) into the skin at bedtime. 10 mL 2   Magnesium  Chloride 64 MG TABS Take 1 tablet (535 mg total) by mouth once daily for 5 days Each tablet contains 64 mg magnesium  = 535 mg magnesium  chloride 5 tablet 0   ondansetron  (ZOFRAN -ODT) 4 MG disintegrating tablet Take 1 tablet (4 mg total) by mouth every 8 (eight) hours as needed for nausea or vomiting. 20 tablet 0   pantoprazole  (PROTONIX ) 40 MG tablet Take 1 tablet (40 mg total) by mouth daily. 90 tablet 1   polyethylene glycol powder (GLYCOLAX /MIRALAX ) 17 GM/SCOOP powder Mix 1capful (17 g) with 4-8 ounces with water/juice by mouth 2 (two) times daily as needed. 510 g 1   carvedilol  (COREG ) 25 MG tablet Take 1 tablet (25 mg total) by mouth 2 (two) times daily with a meal. 60 tablet 0   Glucagon  (BAQSIMI  ONE PACK) 3 MG/DOSE POWD Place 1 Device into the nose as needed (Low blood sugar with impaired consciousness). (Patient not taking: Reported on 10/30/2023) 2 each 3   No current facility-administered  medications for this visit.    Hypertensive Medication Review: Patient states that they are taking all their hypertensive medications as prescribed and their last dose of hypertensive medications was this morning   Documentation of any medication adherence discrepancies: none  Provider Recommendation:  Spoke to [name], FNP and they stated: Recheck manually and follow up Catie Rudy on the 28th of January. Pt will start Valsartan  per Tonya and follow up with her in February.  Patient has been scheduled to follow up with 01/14/24 at 3 pm   Patient has been given provider's recommendations and does not have any questions or concerns at this time. Patient will contact the office for any future questions or concerns.   Suzen Shove RMA

## 2023-11-03 ENCOUNTER — Other Ambulatory Visit: Payer: Self-pay

## 2023-11-24 ENCOUNTER — Other Ambulatory Visit: Payer: Self-pay

## 2023-11-24 ENCOUNTER — Other Ambulatory Visit: Payer: Self-pay | Admitting: Nurse Practitioner

## 2023-11-24 DIAGNOSIS — E109 Type 1 diabetes mellitus without complications: Secondary | ICD-10-CM

## 2023-11-24 MED ORDER — INSULIN GLARGINE 100 UNIT/ML ~~LOC~~ SOLN
28.0000 [IU] | Freq: Every day | SUBCUTANEOUS | 2 refills | Status: DC
  Filled 2023-11-24: qty 10, 28d supply, fill #0
  Filled 2023-12-26: qty 10, 28d supply, fill #1
  Filled 2024-01-23: qty 10, 28d supply, fill #2

## 2023-11-25 ENCOUNTER — Other Ambulatory Visit: Payer: Self-pay

## 2023-11-27 ENCOUNTER — Other Ambulatory Visit: Payer: Self-pay

## 2023-12-17 ENCOUNTER — Emergency Department (HOSPITAL_COMMUNITY): Payer: Medicaid Other

## 2023-12-17 ENCOUNTER — Telehealth: Payer: Self-pay | Admitting: Nurse Practitioner

## 2023-12-17 ENCOUNTER — Encounter (HOSPITAL_COMMUNITY): Payer: Self-pay | Admitting: Emergency Medicine

## 2023-12-17 ENCOUNTER — Emergency Department (HOSPITAL_COMMUNITY)
Admission: EM | Admit: 2023-12-17 | Discharge: 2023-12-18 | Disposition: A | Discharge status: Home / Self Care | Payer: Medicaid Other | Attending: Emergency Medicine | Admitting: Emergency Medicine

## 2023-12-17 ENCOUNTER — Other Ambulatory Visit: Payer: Self-pay

## 2023-12-17 DIAGNOSIS — X58XXXA Exposure to other specified factors, initial encounter: Secondary | ICD-10-CM | POA: Insufficient documentation

## 2023-12-17 DIAGNOSIS — Z794 Long term (current) use of insulin: Secondary | ICD-10-CM | POA: Insufficient documentation

## 2023-12-17 DIAGNOSIS — R569 Unspecified convulsions: Secondary | ICD-10-CM | POA: Diagnosis not present

## 2023-12-17 DIAGNOSIS — E162 Hypoglycemia, unspecified: Secondary | ICD-10-CM

## 2023-12-17 DIAGNOSIS — S43025A Posterior dislocation of left humerus, initial encounter: Secondary | ICD-10-CM | POA: Insufficient documentation

## 2023-12-17 DIAGNOSIS — E119 Type 2 diabetes mellitus without complications: Secondary | ICD-10-CM | POA: Insufficient documentation

## 2023-12-17 DIAGNOSIS — S4992XA Unspecified injury of left shoulder and upper arm, initial encounter: Secondary | ICD-10-CM | POA: Diagnosis present

## 2023-12-17 LAB — COMPREHENSIVE METABOLIC PANEL
ALT: 20 U/L (ref 0–44)
AST: 33 U/L (ref 15–41)
Albumin: 4.5 g/dL (ref 3.5–5.0)
Alkaline Phosphatase: 53 U/L (ref 38–126)
Anion gap: 17 — ABNORMAL HIGH (ref 5–15)
BUN: 16 mg/dL (ref 6–20)
CO2: 18 mmol/L — ABNORMAL LOW (ref 22–32)
Calcium: 9.9 mg/dL (ref 8.9–10.3)
Chloride: 102 mmol/L (ref 98–111)
Creatinine, Ser: 1.56 mg/dL — ABNORMAL HIGH (ref 0.61–1.24)
GFR, Estimated: 59 mL/min — ABNORMAL LOW (ref >60)
Glucose, Bld: 70 mg/dL (ref 70–99)
Potassium: 3.7 mmol/L (ref 3.5–5.1)
Sodium: 137 mmol/L (ref 135–145)
Total Bilirubin: 1 mg/dL (ref 0.0–1.2)
Total Protein: 7.6 g/dL (ref 6.5–8.1)

## 2023-12-17 LAB — CBC WITH DIFFERENTIAL/PLATELET
Abs Immature Granulocytes: 0.08 10*3/uL — ABNORMAL HIGH (ref 0.00–0.07)
Basophils Absolute: 0.1 10*3/uL (ref 0.0–0.1)
Basophils Relative: 1 %
Eosinophils Absolute: 0.2 10*3/uL (ref 0.0–0.5)
Eosinophils Relative: 2 %
HCT: 46.2 % (ref 39.0–52.0)
Hemoglobin: 15.6 g/dL (ref 13.0–17.0)
Immature Granulocytes: 1 %
Lymphocytes Relative: 22 %
Lymphs Abs: 2.8 10*3/uL (ref 0.7–4.0)
MCH: 31.5 pg (ref 26.0–34.0)
MCHC: 33.8 g/dL (ref 30.0–36.0)
MCV: 93.1 fL (ref 80.0–100.0)
Monocytes Absolute: 0.7 10*3/uL (ref 0.1–1.0)
Monocytes Relative: 6 %
Neutro Abs: 8.5 10*3/uL — ABNORMAL HIGH (ref 1.7–7.7)
Neutrophils Relative %: 68 %
Platelets: 298 10*3/uL (ref 150–400)
RBC: 4.96 MIL/uL (ref 4.22–5.81)
RDW: 12.2 % (ref 11.5–15.5)
WBC: 12.4 10*3/uL — ABNORMAL HIGH (ref 4.0–10.5)
nRBC: 0 % (ref 0.0–0.2)

## 2023-12-17 LAB — CBG MONITORING, ED
Glucose-Capillary: 70 mg/dL (ref 70–99)
Glucose-Capillary: 54 mg/dL — ABNORMAL LOW (ref 70–99)

## 2023-12-17 MED ORDER — FENTANYL CITRATE PF 50 MCG/ML IJ SOSY
50.0000 ug | PREFILLED_SYRINGE | Freq: Once | INTRAMUSCULAR | Status: AC
  Administered 2023-12-17: 50 ug via INTRAVENOUS
  Filled 2023-12-17: qty 1

## 2023-12-17 MED ORDER — DEXTROSE 5 % IV BOLUS
250.0000 mL | Freq: Once | INTRAVENOUS | Status: AC
  Administered 2023-12-17: 250 mL via INTRAVENOUS

## 2023-12-17 MED ORDER — DEXTROSE 50 % IV SOLN
INTRAVENOUS | Status: AC
  Filled 2023-12-17: qty 50

## 2023-12-17 MED ORDER — LIDOCAINE HCL (PF) 1 % IJ SOLN
30.0000 mL | Freq: Once | INTRAMUSCULAR | Status: AC
  Administered 2023-12-17: 30 mL via INTRADERMAL
  Filled 2023-12-17: qty 30

## 2023-12-17 MED ORDER — HYDROMORPHONE HCL 1 MG/ML IJ SOLN
1.0000 mg | Freq: Once | INTRAMUSCULAR | Status: AC
  Administered 2023-12-17: 1 mg via INTRAVENOUS
  Filled 2023-12-17: qty 1

## 2023-12-17 MED ORDER — DEXTROSE 50 % IV SOLN
1.0000 | Freq: Once | INTRAVENOUS | Status: AC
  Administered 2023-12-17: 50 mL via INTRAVENOUS

## 2023-12-17 NOTE — Discharge Instructions (Addendum)
 We suspect you had a seizure from low blood sugar.  It is important that you eat appropriately when taking your insulin dose.  You should not drive or operate heavy machinery until cleared by your doctor.  Return to the ED with new or worsening symptoms.

## 2023-12-17 NOTE — ED Triage Notes (Signed)
 Patient BIB [facility].  EMS reports patient's mother found him unresponsive in a recliner and called 911.  EMS reports CBG read "lo" initially, so they gave him 25 G of d10.  Patient then started having posturing seizure like activity that last approx 30 secs.  EMS did assist with respirations about 2-3 mins.  20 L upper arm 100% 2 L Julian GCS 15 CBG 86 HR 102 175/114 Hx of htn and seizures RR 30 Etco2 35

## 2023-12-17 NOTE — ED Notes (Signed)
 Patient refused CBG check since he has a self-monitoring device, self-monitoring device app read 223

## 2023-12-17 NOTE — ED Provider Notes (Signed)
 [place] EMERGENCY DEPARTMENT AT [facility] Provider Note   CSN: [id number] Arrival date & time: 12/17/23  2059     History Chief Complaint  Patient presents with   Hypoglycemia   Seizures    HPI Zachary Vazquez is a 38 y.o. male presenting for chief complaint of seizure episode.  He is a 38 year old male on multiple medications for epilepsy. States that he also has diabetes, took his insulin but did not eat lunch today.  Witnessed tonic-clonic seizure found to have a blood glucose reading of undetectable and was given 25 g of IV dextrose.  Blood glucose responded, seizure abated. Patient states has been compliant on his medications otherwise. Denies fevers chills nausea vomiting syncope shortness of breath.  Does have substantial left shoulder pain on my evaluation.  History of substantial left shoulder pain after prior seizures as well.  Has some difficulty with range of motion.  Patient's recorded medical, surgical, social, medication list and allergies were reviewed in the Snapshot window as part of the initial history.   Review of Systems   Review of Systems  Constitutional:  Negative for chills and fever.  HENT:  Negative for ear pain and sore throat.   Eyes:  Negative for pain and visual disturbance.  Respiratory:  Negative for cough and shortness of breath.   Cardiovascular:  Negative for chest pain and palpitations.  Gastrointestinal:  Negative for abdominal pain and vomiting.  Genitourinary:  Negative for dysuria and hematuria.  Musculoskeletal:  Negative for arthralgias and back pain.  Skin:  Negative for color change and rash.  Neurological:  Positive for seizures. Negative for syncope.  All other systems reviewed and are negative.   Physical Exam Updated Vital Signs BP (!) 146/103   Pulse 100   Temp (!) 96.5 F (35.8 C) (Temporal)   Resp (!) 24   Wt 79.4 kg   SpO2 100%   BMI 24.41 kg/m  Physical Exam Vitals and nursing note reviewed.   Constitutional:      General: He is not in acute distress.    Appearance: He is well-developed.  HENT:     Head: Normocephalic and atraumatic.  Eyes:     Conjunctiva/sclera: Conjunctivae normal.  Cardiovascular:     Rate and Rhythm: Normal rate and regular rhythm.     Heart sounds: No murmur heard. Pulmonary:     Effort: Pulmonary effort is normal. No respiratory distress.     Breath sounds: Normal breath sounds.  Abdominal:     Palpations: Abdomen is soft.     Tenderness: There is no abdominal tenderness.  Musculoskeletal:        General: No swelling.     Cervical back: Neck supple.  Skin:    General: Skin is warm and dry.     Capillary Refill: Capillary refill takes less than 2 seconds.  Neurological:     Mental Status: He is alert.  Psychiatric:        Mood and Affect: Mood normal.      ED Course/ Medical Decision Making/ A&P Clinical Course as of 12/17/23 2320  Wed Dec 17, 2023  2132 DG Chest Portable 1 View [CC]    Clinical Course User Index [CC] Glyn Ade, MD    Procedures .Reduction of dislocation  Date/Time: 12/17/2023 11:19 PM  Performed by: Glyn Ade, MD Authorized by: Glyn Ade, MD  Consent: Verbal consent obtained. Risks and benefits: risks, benefits and alternatives were discussed Patient identity confirmed: verbally with patient Preparation:  Patient was prepped and draped in the usual sterile fashion. Local anesthesia used: yes Anesthesia: local infiltration  Anesthesia: Local anesthesia used: yes Local Anesthetic: lidocaine 1% without epinephrine Patient tolerance: patient tolerated the procedure well with no immediate complications Comments: Shoulder reduction left shoulder.  Timeout performed.  Local block achieved with joint capsule block with 20 mL lidocaine.  Patient tolerated procedure well.  Repeat x-ray following procedure demonstrated relocation of joint.  Placed into a sling at that time.      Medications  Ordered in ED Medications  dextrose 5 % bolus 250 mL (0 mLs Intravenous Stopped 12/17/23 2158)  fentaNYL (SUBLIMAZE) injection 50 mcg (50 mcg Intravenous Given 12/17/23 2137)  lidocaine (PF) (XYLOCAINE) 1 % injection 30 mL (30 mLs Intradermal Given 12/17/23 2203)  HYDROmorphone (DILAUDID) injection 1 mg (1 mg Intravenous Given 12/17/23 2204)  dextrose 50 % solution 50 mL (50 mLs Intravenous Given 12/17/23 2253)    Medical Decision Making:   38 year old male presenting with seizure episodes likely secondary to hypoglycemia due to taking insulin and not having the opportunity for p.o. meal at lunch. This appears to have triggered a seizure episode in the setting of underlying epilepsy. Fortunately he returned to baseline after IV glucose.  His glucose had already dropped back down to 70 from 180 by the time of my evaluation.  I ordered another dextrose bolus. Regarding his left shoulder pain is either muscular versus dislocation. Will give pain medication and IV to allow for better testing of range of motion of the joint and perform x-ray of the joint. Reassessment: Shoulder reduction successful per repeat x-ray.  Blood sugar still in the 50s.  He is p.o. being at this time.  Finishing his dextrose bolus and p.o. dextrose.  If his blood sugar is able to stay stable off of the dexterous, patient be stable for discharge. He is ambulatory tolerating p.o. intake and is requesting discharge and feels comfortable outpatient management as long as his blood sugar will hold.  Clinical Impression:  1. Seizures (HCC)      Data Unavailable   Final Clinical Impression(s) / ED Diagnoses Final diagnoses:  Seizures (HCC)    Rx / DC Orders ED Discharge Orders     None         Glyn Ade, MD 12/17/23 2320

## 2023-12-18 NOTE — ED Notes (Signed)
Patient ambulated with minimal assistance.

## 2023-12-18 NOTE — ED Provider Notes (Signed)
 Care assumed from Dr. Doran Durand.  Patient here with hypoglycemia and subsequent seizure with shoulder dislocation, now reduced.  Admits to taking his insulin but not eating well today.  Sugar has improved to over 200 based on patient's glucose monitor.  He is refusing further fingersticks.  He was able to tolerate juice and a sandwich.  Patient was able to eat a sandwich and tolerate p.o. and ambulate.  Most recent blood sugars 223 on his home device.  He declines further fingersticks.  Suspect hypoglycemia in setting of insulin and poor p.o. intake.  Discussed that he should always eat when he takes his insulin.  Return precautions discussed   Zachary Octave, MD 12/18/23 [phone]

## 2023-12-18 NOTE — ED Notes (Signed)
 Patient refused bus pass ,stated he never ride a bus .

## 2023-12-19 ENCOUNTER — Telehealth: Payer: Self-pay

## 2023-12-19 NOTE — Transitions of Care (Post Inpatient/ED Visit) (Signed)
   12/19/2023  Name: Kenden Brandt MRN: [id number] DOB: 06-16-1986  Today's TOC FU Call Status: Today's TOC FU Call Status:: Unsuccessful Call (1st Attempt) Unsuccessful Call (1st Attempt) Date: 12/19/23  Attempted to reach the patient regarding the most recent Inpatient/ED visit.  Follow Up Plan: Additional outreach attempts will be made to reach the patient to complete the Transitions of Care (Post Inpatient/ED visit) call.   Signature Renelda Loma RMA

## 2023-12-26 ENCOUNTER — Encounter: Payer: Self-pay | Admitting: Nurse Practitioner

## 2023-12-26 ENCOUNTER — Ambulatory Visit: Payer: Medicaid Other | Admitting: Nurse Practitioner

## 2023-12-26 ENCOUNTER — Other Ambulatory Visit: Payer: Self-pay

## 2023-12-26 VITALS — BP 138/83 | HR 89 | Temp 97.0°F | Wt 183.0 lb

## 2023-12-26 DIAGNOSIS — S43005D Unspecified dislocation of left shoulder joint, subsequent encounter: Secondary | ICD-10-CM | POA: Diagnosis not present

## 2023-12-26 DIAGNOSIS — E1010 Type 1 diabetes mellitus with ketoacidosis without coma: Secondary | ICD-10-CM

## 2023-12-26 DIAGNOSIS — I152 Hypertension secondary to endocrine disorders: Secondary | ICD-10-CM | POA: Diagnosis not present

## 2023-12-26 DIAGNOSIS — E1159 Type 2 diabetes mellitus with other circulatory complications: Secondary | ICD-10-CM

## 2023-12-26 MED ORDER — PANTOPRAZOLE SODIUM 40 MG PO TBEC
40.0000 mg | DELAYED_RELEASE_TABLET | Freq: Every day | ORAL | 1 refills | Status: DC
  Filled 2023-12-26: qty 90, 90d supply, fill #0
  Filled 2024-03-24: qty 90, 90d supply, fill #1

## 2023-12-26 MED ORDER — FREESTYLE LIBRE 3 PLUS SENSOR MISC
1 refills | Status: DC
  Filled 2024-03-24: qty 6, 90d supply, fill #0

## 2023-12-26 MED ORDER — CARVEDILOL 25 MG PO TABS
25.0000 mg | ORAL_TABLET | Freq: Two times a day (BID) | ORAL | 3 refills | Status: DC
  Filled 2023-12-26: qty 60, 30d supply, fill #0
  Filled 2024-01-23: qty 60, 30d supply, fill #1
  Filled 2024-02-23: qty 60, 30d supply, fill #2
  Filled 2024-03-24: qty 60, 30d supply, fill #3

## 2023-12-26 NOTE — Progress Notes (Signed)
 Subjective   Patient ID: Zachary Vazquez, male    DOB: 06-20-1986, 38 y.o.   MRN: [id number]  Chief Complaint  Patient presents with   Diabetes    Referring provider: Ivonne Andrew, NP  Zachary Vazquez is a 38 y.o. 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  He has followed with endocrinology for type 1 diabetes since his last visit here.  Requesting new referral for different endocrinologist.  We do have pharmacy fill on him for medication management as well. Patient did recently have a seizure due to low blood sugars and dislocated his left shoulder. Will place referral to ortho. Patient was treated in ED for this. Patient states that blood pressure is not managed at home. Will place referral to hypertension clinic. Denies f/c/s, n/v/d, hemoptysis, PND, leg swelling Denies chest pain or edema     Allergies  Allergen Reactions   Clindamycin/Lincomycin Anaphylaxis   Bactrim [Sulfamethoxazole-Trimethoprim] Swelling and Other (See Comments)    Eyes swell and bumps appear on/near the eyes     Immunization History  Administered Date(s) Administered   Tdap 12/15/2019    Tobacco History: Social History   Tobacco Use  Smoking Status Every Day   Current packs/day: 1.00   Average packs/day: 1 pack/day for 15.0 years (15.0 ttl pk-yrs)   Types: Cigarettes  Smokeless Tobacco Never   Ready to quit: Not Answered Counseling given: Not Answered   Outpatient Encounter Medications as of 12/26/2023  Medication Sig   acetaminophen (TYLENOL) 325 MG tablet Take 2 tablets (650 mg total) by mouth every 6 (six) hours as needed for mild pain (or Fever >/= 101).    insulin aspart (NOVOLOG) 100 UNIT/ML injection Use as directed per sliding scale   insulin glargine (LANTUS) 100 UNIT/ML injection Inject 0.28 mLs (28 Units total) into the skin at bedtime.   Magnesium Chloride 64 MG TABS Take 1 tablet (535 mg total) by mouth once daily for 5 days Each tablet contains 64 mg magnesium = 535 mg magnesium chloride   ondansetron (ZOFRAN-ODT) 4 MG disintegrating tablet Take 1 tablet (4 mg total) by mouth every 8 (eight) hours as needed for nausea or vomiting.   polyethylene glycol powder (GLYCOLAX/MIRALAX) 17 GM/SCOOP powder Mix 1capful (17 g) with 4-8 ounces with water/juice by mouth 2 (two) times daily as needed.   [DISCONTINUED] carvedilol (COREG) 25 MG tablet Take 1 tablet (25 mg total) by mouth 2 (two) times daily with a meal.   [DISCONTINUED] Continuous Glucose Sensor (FREESTYLE LIBRE 3 PLUS SENSOR) MISC Change sensor every 15 days.   [DISCONTINUED] valsartan (DIOVAN) 80 MG tablet Take 1 tablet (80 mg total) by mouth daily.   carvedilol (COREG) 25 MG tablet Take 1 tablet (25 mg total) by mouth 2 (two) times daily with a meal.   carvedilol (COREG) 25 MG tablet Take 1 tablet (25 mg total) by mouth 2 (two) times daily with a meal.   Continuous Glucose Sensor (FREESTYLE LIBRE 3 PLUS SENSOR) MISC Change sensor every 15 days.   Glucagon (BAQSIMI ONE PACK) 3 MG/DOSE POWD Place 1 Device into the nose as needed (Low blood sugar with impaired consciousness). (Patient not taking: Reported on 10/15/2023)   pantoprazole (PROTONIX) 40 MG  tablet Take 1 tablet (40 mg total) by mouth daily.   [DISCONTINUED] pantoprazole (PROTONIX) 40 MG tablet Take 1 tablet (40 mg total) by mouth daily. (Patient not taking: Reported on 12/26/2023)   No facility-administered encounter medications on file as of 12/26/2023.    Review of Systems  Review of Systems  Constitutional: Negative.   HENT: Negative.    Cardiovascular: Negative.   Gastrointestinal: Negative.   Allergic/Immunologic:  Negative.   Neurological: Negative.   Psychiatric/Behavioral: Negative.       Objective:   BP 138/83   Pulse 89   Temp (!) 97 F (36.1 C)   Wt 183 lb (83 kg)   SpO2 100%   BMI 25.52 kg/m   Wt Readings from Last 5 Encounters:  12/26/23 183 lb (83 kg)  12/17/23 175 lb (79.4 kg)  10/30/23 180 lb (81.6 kg)  10/15/23 180 lb 4.8 oz (81.8 kg)  10/13/23 180 lb 6.4 oz (81.8 kg)     Physical Exam Vitals and nursing note reviewed.  Constitutional:      General: He is not in acute distress.    Appearance: He is well-developed.  Cardiovascular:     Rate and Rhythm: Normal rate and regular rhythm.  Pulmonary:     Effort: Pulmonary effort is normal.     Breath sounds: Normal breath sounds.  Musculoskeletal:     Left shoulder: Tenderness present. No swelling. Decreased range of motion. Decreased strength.  Skin:    General: Skin is warm and dry.  Neurological:     Mental Status: He is alert and oriented to person, place, and time.       Assessment & Plan:   Uncontrolled type 1 diabetes mellitus with ketoacidosis without coma, with long-term current use of insulin (HCC) -     FreeStyle Libre 3 Plus Sensor; Change sensor every 15 days.  Dispense: 6 each; Refill: 1 -     AMB Referral [facility]I Care Management -     Ambulatory referral to Endocrinology  Hypertension associated with diabetes (HCC) -     Carvedilol; Take 1 tablet (25 mg total) by mouth 2 (two) times daily with a meal.  Dispense: 60 tablet; Refill: 3 -     Ambulatory referral to [facility]  Dislocation of left shoulder joint, subsequent encounter -     Ambulatory referral to Orthopedic Surgery  Other orders -     Pantoprazole Sodium; Take 1 tablet (40 mg total) by mouth daily.  Dispense: 90 tablet; Refill: 1     Return in about 3 months (around 03/24/2024).   Ivonne Andrew, NP 12/26/2023

## 2023-12-26 NOTE — Patient Instructions (Addendum)
 Exercises After Repair of Unstable Shoulder From Injury: Strengthening Intro Shoulder exercises can help you get better after an injury. Only do the exercises you were told to do. Make sure you know how to do the exercises safely. Follow the steps below. It's normal to feel mild discomfort. Stop if you feel pain or your pain gets worse. Do not start these exercises until told by your health care provider. Exercises for strength These exercises build strength and endurance in your shoulder. Endurance means being able to use your muscles for longer, even after they get tired. For isometric exercises, you push or pull against something that doesn't move. This helps your muscles get stronger without working your body much. Scapular retraction This exercise is also called shoulder blade squeezes. Sit with good posture in a stable chair. Do not let your back touch the back of the chair. Your arms should be at your sides with your elbows bent. You may rest your forearms on a pillow if that is more comfortable. Squeeze your shoulder blades together. Bring them down and back. Keep your shoulders level. Do not lift your shoulders up toward your ears. Hold this position for __________ seconds. Slowly go back to the starting position. Repeat __________ times. Do this exercise __________ times a day. Shoulder abduction, isometric In this exercise, you put pressure on your shoulder without moving your shoulder joint. Stand or sit about 4-6 inches (10-15 cm) away from a wall with your left / right side facing the wall. Bend your left / right elbow and gently press your elbow against the wall as if you're trying to move your arm out to your side. Increase the pressure slowly until you're pressing as hard as you can without shrugging or causing pain in your shoulder. Hold for __________ seconds. Slowly release the tension, and relax your muscles completely before you repeat the exercise. Repeat __________  times. Do this exercise __________ times a day. Shoulder flexion, isometric  Stand or sit in a doorway, facing the door frame. Keep your left / right arm straight and make a gentle fist with your hand. Place your fist against the door frame. Only your fist should be touching the frame. Keep your upper arm at your side. Gently press your fist against the door frame, as if you're trying to raise your arm above your head. Avoid shrugging your shoulder while you press your hand into the door frame. Keep your shoulder blade tucked down toward the middle of your back. Hold for __________ seconds. Slowly release the tension, and relax your muscles completely before you repeat the exercise. Repeat __________ times. Do this exercise __________ times a day. Internal rotation, isometric This is an exercise that you do by pressing your palm against a door frame without moving your shoulder joint. Stand or sit in a doorway, facing the door frame. Bend your left / right elbow and place the palm of your hand against the door frame. Only your palm should be touching the frame. Keep your upper arm at your side. Gently press your hand against the door frame, as if you're trying to push your arm toward your belly. Gradually increase the pressure until you're pressing as hard as you can. Stop increasing the pressure if you feel shoulder pain Avoid shrugging your shoulder while you press your hand into the door frame. Keep your shoulder blade tucked down toward the middle of your back. Hold for __________ seconds. Slowly release the tension, and relax your muscles completely  before you repeat the exercise. Repeat __________ times. Do this exercise __________ times a day. External rotation, isometric This is an exercise that you do by pressing the back of your wrist against a door frame without moving your shoulder joint. Stand or sit in a doorway, facing the door frame. Bend your left / right elbow and place the  back of your wrist against the door frame. Only the back of your wrist should be touching the frame. Keep your upper arm at your side. Gently press your wrist against the door frame, as if you're trying to push your arm away from your belly. Gradually increase the pressure until you're pressing as hard as you can. Stop increasing the pressure if you feel pain. Avoid shrugging your shoulder while you press your wrist into the door frame. Keep your shoulder blade tucked down toward the middle of your back. Hold for __________ seconds. Slowly release the tension, and relax your muscles completely before you repeat the exercise. Repeat __________ times. Do this exercise __________ times a day. This information is not intended to replace advice given to you by your health care provider. Make sure you discuss any questions you have with your health care provider. Document Revised: 09/17/2023 Document Reviewed: 07/21/2023 Elsevier Patient Education  2024 [employer].

## 2023-12-29 ENCOUNTER — Other Ambulatory Visit: Payer: Self-pay

## 2024-01-02 ENCOUNTER — Other Ambulatory Visit: Payer: Self-pay

## 2024-01-02 DIAGNOSIS — Z79899 Other long term (current) drug therapy: Secondary | ICD-10-CM

## 2024-01-02 NOTE — Progress Notes (Signed)
 01/02/2024 Name: Zachary Vazquez MRN: [id number] DOB: [birth year]  Chief Complaint  Patient presents with   Diabetes   Medication Management    Zachary Vazquez is a 38 y.o. year old male who presented for a telephone visit.   They were referred to the pharmacist by their PCP for assistance in managing diabetes. Per chart review, patient has a referral for HTN clinic    Subjective:  Care Team: Primary Care Provider: Ivonne Andrew, NP ; Next Scheduled Visit: 01/14/2024 Endocrinologist: 02/02/2024  Medication Access/Adherence  Current Pharmacy:  [facility] [address], [address] [place] [address] Phone: [phone] Fax: [phone]   Patient reports affordability concerns with their medications: No  Patient reports access/transportation concerns to their pharmacy: No  Patient reports adherence concerns with their medications:  Yes  Rare (maybe once a month for medications) -- The patient reports being compliant with his medication. However, upon reviewing his medication fill history, there is a several-month gap in the records. The patient explained that this gap could be due to 'issues on the pharmacy's end' related to billing."  Diabetes:  Current medications: Lantus 28 units nightly, Novolog 100 units/mL SSI (before meals)   Current glucose readings:  Using CGM meter; testing several times daily   Date of Download: 12/20/2023 - 01/02/2024      Patient reports hypoglycemic s/sx including dizziness, shakiness, sweating. Patient reports hyperglycemic symptoms including some polyuria, polydipsia, polyphagia, nocturia, neuropathy, blurred vision.  Current meal patterns:  - Breakfast: skips d/t early work time - Lunch sandwich and chips  - Supper inconsistently eats dinner; smoothie if nauseated  - Snacks : eat usually to regulate BG  - Drinks water, Powerade Zero  Current physical activity: Work  related (basically none); no traditional workout routine    Objective:  Lab Results  Component Value Date   HGBA1C 5.5 10/16/2023    Lab Results  Component Value Date   CREATININE 1.56 (H) 12/17/2023   BUN 16 12/17/2023   NA 137 12/17/2023   K 3.7 12/17/2023   CL 102 12/17/2023   CO2 18 (L) 12/17/2023    Lab Results  Component Value Date   CHOL 190 10/12/2021   HDL 58 10/12/2021   LDLCALC 121 (H) 10/12/2021   TRIG 57 10/12/2021   CHOLHDL 3.3 10/12/2021    Medications Reviewed Today   Medications were not reviewed in this encounter   **I was unable to complete a proper medication review. When asked about allergies, the patient stated, 'Everything is exactly the same, and nothing has changed.' When I inquired about the first medication on his list, he gave the same response.    Assessment/Plan:   Diabetes: - Currently uncontrolled per last data report of CGM, but prior A1c was below goal. He denies medication adherence issues and discussed the importance of compliance. Patient reports that he has a low appetite and avoids using insulin when he skips meals. Discussed re-trying Glucerna to help manage his caloric intake and diabetes. He has tried the Administrator and does not like but is willing to try chocolate and vanilla. Additionally, he is interested in an insulin pump such as the Omni pop. - Reviewed long term cardiovascular and renal outcomes of uncontrolled blood sugar - Reviewed goal A1c, goal fasting, and goal 2 hour post prandial glucose - Reviewed dietary modifications including Glucerna sample pack  - Reviewed lifestyle modifications including: 150 min/week of moderate exercise when able  -  Recommend to check glucose as many times as you have been  - May consider Glucerna supplement for patient as well as insulin pump given patients history of ED visits related to diabetes    Follow Up Plan: 4 weeks   Thank you for allowing pharmacy to be a part of  this patient's care. Cephus Shelling, PharmD Clinical Pharmacist Cell: [phone]

## 2024-01-07 ENCOUNTER — Telehealth: Payer: Self-pay

## 2024-01-07 NOTE — Progress Notes (Signed)
 Care Coordination Call   I contacted [employer] for general information regarding coverage for Glucerna and an insulin pump. While I was not provided with specific details, I was informed that coverage is available for most diabetic supplies, and that if the device exceeds $750, prior approval will be required.  Thank you for allowing pharmacy to be a part of this patient's care. Cephus Shelling, PharmD Clinical Pharmacist Cell: [phone]

## 2024-01-11 ENCOUNTER — Other Ambulatory Visit: Payer: Self-pay

## 2024-01-11 ENCOUNTER — Encounter (HOSPITAL_COMMUNITY): Payer: Self-pay

## 2024-01-11 ENCOUNTER — Emergency Department (HOSPITAL_COMMUNITY)
Admission: EM | Admit: 2024-01-11 | Discharge: 2024-01-11 | Disposition: A | Discharge status: Home / Self Care | Attending: Emergency Medicine | Admitting: Emergency Medicine

## 2024-01-11 DIAGNOSIS — R112 Nausea with vomiting, unspecified: Secondary | ICD-10-CM | POA: Diagnosis present

## 2024-01-11 DIAGNOSIS — E1165 Type 2 diabetes mellitus with hyperglycemia: Secondary | ICD-10-CM | POA: Diagnosis not present

## 2024-01-11 DIAGNOSIS — R739 Hyperglycemia, unspecified: Secondary | ICD-10-CM

## 2024-01-11 DIAGNOSIS — Z794 Long term (current) use of insulin: Secondary | ICD-10-CM | POA: Insufficient documentation

## 2024-01-11 LAB — I-STAT CHEM 8, ED
BUN: 26 mg/dL — ABNORMAL HIGH (ref 6–20)
Calcium, Ion: 1.08 mmol/L — ABNORMAL LOW (ref 1.15–1.40)
Chloride: 105 mmol/L (ref 98–111)
Creatinine, Ser: 1.6 mg/dL — ABNORMAL HIGH (ref 0.61–1.24)
Glucose, Bld: 281 mg/dL — ABNORMAL HIGH (ref 70–99)
HCT: 46 % (ref 39.0–52.0)
Hemoglobin: 15.6 g/dL (ref 13.0–17.0)
Potassium: 4.7 mmol/L (ref 3.5–5.1)
Sodium: 135 mmol/L (ref 135–145)
TCO2: 22 mmol/L (ref 22–32)

## 2024-01-11 LAB — BASIC METABOLIC PANEL
Anion gap: 17 — ABNORMAL HIGH (ref 5–15)
Anion gap: 10 (ref 5–15)
BUN: 25 mg/dL — ABNORMAL HIGH (ref 6–20)
BUN: 28 mg/dL — ABNORMAL HIGH (ref 6–20)
CO2: 19 mmol/L — ABNORMAL LOW (ref 22–32)
CO2: 22 mmol/L (ref 22–32)
Calcium: 9.9 mg/dL (ref 8.9–10.3)
Calcium: 8.8 mg/dL — ABNORMAL LOW (ref 8.9–10.3)
Chloride: 101 mmol/L (ref 98–111)
Chloride: 103 mmol/L (ref 98–111)
Creatinine, Ser: 1.62 mg/dL — ABNORMAL HIGH (ref 0.61–1.24)
Creatinine, Ser: 1.48 mg/dL — ABNORMAL HIGH (ref 0.61–1.24)
GFR, Estimated: 56 mL/min — ABNORMAL LOW (ref >60)
GFR, Estimated: >60 mL/min (ref >60)
Glucose, Bld: 293 mg/dL — ABNORMAL HIGH (ref 70–99)
Glucose, Bld: 287 mg/dL — ABNORMAL HIGH (ref 70–99)
Potassium: 4.7 mmol/L (ref 3.5–5.1)
Potassium: 4.4 mmol/L (ref 3.5–5.1)
Sodium: 137 mmol/L (ref 135–145)
Sodium: 135 mmol/L (ref 135–145)

## 2024-01-11 LAB — I-STAT VENOUS BLOOD GAS, ED
Acid-base deficit: 1 mmol/L (ref 0.0–2.0)
Bicarbonate: 21.4 mmol/L (ref 20.0–28.0)
Calcium, Ion: 1.1 mmol/L — ABNORMAL LOW (ref 1.15–1.40)
HCT: 45 % (ref 39.0–52.0)
Hemoglobin: 15.3 g/dL (ref 13.0–17.0)
O2 Saturation: 84 %
Potassium: 4.7 mmol/L (ref 3.5–5.1)
Sodium: 136 mmol/L (ref 135–145)
TCO2: 22 mmol/L (ref 22–32)
pCO2, Ven: 29.3 mmHg — ABNORMAL LOW (ref 44–60)
pH, Ven: 7.472 — ABNORMAL HIGH (ref 7.25–7.43)
pO2, Ven: 45 mmHg (ref 32–45)

## 2024-01-11 LAB — URINALYSIS, ROUTINE W REFLEX MICROSCOPIC
Bacteria, UA: NONE SEEN
Bilirubin Urine: NEGATIVE
Glucose, UA: 50 mg/dL — AB
Ketones, ur: 20 mg/dL — AB
Leukocytes,Ua: NEGATIVE
Nitrite: NEGATIVE
Protein, ur: 100 mg/dL — AB
Specific Gravity, Urine: 1.009 (ref 1.005–1.030)
pH: 5 (ref 5.0–8.0)

## 2024-01-11 LAB — CBC
HCT: 44.3 % (ref 39.0–52.0)
Hemoglobin: 15.3 g/dL (ref 13.0–17.0)
MCH: 32 pg (ref 26.0–34.0)
MCHC: 34.5 g/dL (ref 30.0–36.0)
MCV: 92.7 fL (ref 80.0–100.0)
Platelets: 277 10*3/uL (ref 150–400)
RBC: 4.78 MIL/uL (ref 4.22–5.81)
RDW: 12.4 % (ref 11.5–15.5)
WBC: 12 10*3/uL — ABNORMAL HIGH (ref 4.0–10.5)
nRBC: 0 % (ref 0.0–0.2)

## 2024-01-11 LAB — CBG MONITORING, ED: Glucose-Capillary: 277 mg/dL — ABNORMAL HIGH (ref 70–99)

## 2024-01-11 MED ORDER — DROPERIDOL 2.5 MG/ML IJ SOLN
1.2500 mg | Freq: Once | INTRAMUSCULAR | Status: AC
  Administered 2024-01-11: 1.25 mg via INTRAVENOUS
  Filled 2024-01-11: qty 2

## 2024-01-11 MED ORDER — METOCLOPRAMIDE HCL 5 MG/ML IJ SOLN
10.0000 mg | Freq: Once | INTRAMUSCULAR | Status: AC
  Administered 2024-01-11: 10 mg via INTRAVENOUS
  Filled 2024-01-11: qty 2

## 2024-01-11 MED ORDER — LACTATED RINGERS IV BOLUS
1000.0000 mL | Freq: Once | INTRAVENOUS | Status: AC
  Administered 2024-01-11: 1000 mL via INTRAVENOUS

## 2024-01-11 MED ORDER — ONDANSETRON 4 MG PO TBDP
4.0000 mg | ORAL_TABLET | Freq: Once | ORAL | Status: AC | PRN
  Administered 2024-01-11: 4 mg via ORAL
  Filled 2024-01-11: qty 1

## 2024-01-11 MED ORDER — DIPHENHYDRAMINE HCL 50 MG/ML IJ SOLN
12.5000 mg | Freq: Once | INTRAMUSCULAR | Status: AC
  Administered 2024-01-11: 12.5 mg via INTRAVENOUS
  Filled 2024-01-11: qty 1

## 2024-01-11 NOTE — ED Provider Notes (Signed)
 Care transferred to me.  Repeat metabolic panel shows that his bicarb is 22 and gap is 10 which are both improvements.  Creatinine is closer to baseline.  He feels well enough for discharge and is requesting discharge and has been drinking oral fluids.  Declines a prescription for antiemetic.  Will discharge home with return precautions.   Pricilla Loveless, MD 01/11/24 [phone]

## 2024-01-11 NOTE — ED Notes (Signed)
 Pt tolerating po fluids and ambulated to the restroom without assistance

## 2024-01-11 NOTE — Discharge Instructions (Signed)
 If you develop new or worsening or uncontrolled vomiting, blood in your vomit, diarrhea or bloody diarrhea, abdominal pain, fever, or any other new/concerning symptoms then return to the ER.

## 2024-01-11 NOTE — ED Notes (Addendum)
 Nt attempted to get EKG on pt. Pt stated "I am allergic to the electrodes, whenever I take them off they leave big red blisters". Holding off on EKG for now. MD notified

## 2024-01-11 NOTE — ED Provider Notes (Signed)
 [place] [facility] AT [facility] Provider Note   CSN: [id number] Arrival date & time: 01/11/24  1125     History {Add pertinent medical, surgical, social history, OB history to HPI:1} Chief Complaint  Patient presents with   Hyperglycemia    Zachary Vazquez is a 38 y.o. male.  38 year old male with a history of insulin-dependent diabetes and recurrent vomiting who presents to the emergency department with nausea and vomiting.  Last night patient started developing some nausea.  Since then has had approximately 5 episodes of nonbloody emesis.  No abdominal pain.  No fevers.  No diarrhea.  Has been compliant with his insulin use (NovoLog sliding scale and Lantus 30 units nightly).  Says he has had DKA in the past and is concerned that that may be what going on.  Also has had multiple visits to the emergency department with recurrent nausea and vomiting thought to be either diabetic gastroparesis or cannabinoid hyperemesis syndrome.  Says that he only smokes marijuana on occasion at this time.  Denies shortness of breath but has had increased thirst recently       Home Medications Prior to Admission medications   Medication Sig Start Date End Date Taking? Authorizing Provider  acetaminophen (TYLENOL) 325 MG tablet Take 2 tablets (650 mg total) by mouth every 6 (six) hours as needed for mild pain (or Fever >/= 101). 04/08/23   Vonna Drafts, MD  carvedilol (COREG) 25 MG tablet Take 1 tablet (25 mg total) by mouth 2 (two) times daily with a meal. 10/24/23     carvedilol (COREG) 25 MG tablet Take 1 tablet (25 mg total) by mouth 2 (two) times daily with a meal. 12/26/23   Ivonne Andrew, NP  Continuous Glucose Sensor (FREESTYLE LIBRE 3 PLUS SENSOR) MISC Change sensor every 15 days. 12/26/23   Ivonne Andrew, NP  Glucagon (BAQSIMI ONE PACK) 3 MG/DOSE POWD Place 1 Device into the nose as needed (Low blood sugar with impaired consciousness). Patient not taking:  Reported on 10/15/2023 10/13/23   Altamese , MD  insulin aspart (NOVOLOG) 100 UNIT/ML injection Use as directed per sliding scale 08/04/23   Ivonne Andrew, NP  insulin glargine (LANTUS) 100 UNIT/ML injection Inject 0.28 mLs (28 Units total) into the skin at bedtime. 11/24/23   Ivonne Andrew, NP  Magnesium Chloride 64 MG TABS Take 1 tablet (535 mg total) by mouth once daily for 5 days Each tablet contains 64 mg magnesium = 535 mg magnesium chloride 10/24/23     ondansetron (ZOFRAN-ODT) 4 MG disintegrating tablet Take 1 tablet (4 mg total) by mouth every 8 (eight) hours as needed for nausea or vomiting. 05/20/23   Sherryll Burger, Pratik D, DO  pantoprazole (PROTONIX) 40 MG tablet Take 1 tablet (40 mg total) by mouth daily. 12/26/23   Ivonne Andrew, NP  polyethylene glycol powder (GLYCOLAX/MIRALAX) 17 GM/SCOOP powder Mix 1capful (17 g) with 4-8 ounces with water/juice by mouth 2 (two) times daily as needed. 04/14/23   Ivonne Andrew, NP      Allergies    Clindamycin/lincomycin and Bactrim [sulfamethoxazole-trimethoprim]    Review of Systems   Review of Systems  Physical Exam Updated Vital Signs BP (!) 146/104 (BP Location: Left Arm)   Pulse (!) 110   Temp 97.8 F (36.6 C) (Oral)   Resp 19   Ht 5\' 11"  (1.803 m)   Wt 77.1 kg   SpO2 100%   BMI 23.71 kg/m  Physical Exam Vitals  and nursing note reviewed.  Constitutional:      General: He is not in acute distress.    Appearance: He is well-developed.  HENT:     Head: Normocephalic and atraumatic.     Right Ear: External ear normal.     Left Ear: External ear normal.     Nose: Nose normal.  Eyes:     Extraocular Movements: Extraocular movements intact.     Conjunctiva/sclera: Conjunctivae normal.     Pupils: Pupils are equal, round, and reactive to light.  Cardiovascular:     Rate and Rhythm: Normal rate and regular rhythm.     Heart sounds: Normal heart sounds.  Pulmonary:     Effort: Pulmonary effort is normal. No respiratory  distress.     Breath sounds: Normal breath sounds.  Abdominal:     General: There is no distension.     Palpations: Abdomen is soft. There is no mass.     Tenderness: There is no abdominal tenderness. There is no guarding.  Musculoskeletal:     Cervical back: Normal range of motion and neck supple.     Right lower leg: No edema.     Left lower leg: No edema.  Skin:    General: Skin is warm and dry.  Neurological:     Mental Status: He is alert. Mental status is at baseline.  Psychiatric:        Mood and Affect: Mood normal.        Behavior: Behavior normal.     ED Results / Procedures / Treatments   Labs (all labs ordered are listed, but only abnormal results are displayed) Labs Reviewed  URINALYSIS, ROUTINE W REFLEX MICROSCOPIC - Abnormal; Notable for the following components:      Result Value   APPearance HAZY (*)    Glucose, UA 50 (*)    Hgb urine dipstick SMALL (*)    Ketones, ur 20 (*)    Protein, ur 100 (*)    All other components within normal limits  CBG MONITORING, ED - Abnormal; Notable for the following components:   Glucose-Capillary 277 (*)    All other components within normal limits  CBC    EKG None  Radiology No results found.  Procedures .Ultrasound ED Peripheral IV (Provider)  Date/Time: 01/11/2024 12:30 PM  Performed by: Rondel Baton, MD Authorized by: Rondel Baton, MD   Procedure details:    Indications: hydration, multiple failed IV attempts and poor IV access     Skin Prep: chlorhexidine gluconate     Location:  Left anterior forearm   Angiocath:  20 G   Bedside Ultrasound Guided: Yes     Images: not archived     Patient tolerated procedure without complications: Yes     Dressing applied: Yes     {Document cardiac monitor, telemetry assessment procedure when appropriate:1}  Medications Ordered in ED Medications  ondansetron (ZOFRAN-ODT) disintegrating tablet 4 mg (4 mg Oral Given 01/11/24 1141)    ED Course/ Medical  Decision Making/ A&P   {   Click here for ABCD2, HEART and other calculatorsREFRESH Note before signing :1}                              Medical Decision Making Amount and/or Complexity of Data Reviewed Labs: ordered.  Risk Prescription drug management.   ***  {Document critical care time when appropriate:1} {Document review of labs and clinical decision  tools ie heart score, Chads2Vasc2 etc:1}  {Document your independent review of radiology images, and any outside records:1} {Document your discussion with family members, caretakers, and with consultants:1} {Document social determinants of health affecting pt's care:1} {Document your decision making why or why not admission, treatments were needed:1} Final Clinical Impression(s) / ED Diagnoses Final diagnoses:  None    Rx / DC Orders ED Discharge Orders     None

## 2024-01-11 NOTE — ED Notes (Signed)
CBG 272. 

## 2024-01-11 NOTE — ED Triage Notes (Signed)
 Pt came in via POV d/t feeling nauseous since last night. States that he is a diabetic & he feels like he is in DKA. Pt reports his monitor states his CBG is 247 during triage. A/Ox4, denies any pain, just continuous n/v.

## 2024-01-12 LAB — CBG MONITORING, ED: Glucose-Capillary: 272 mg/dL — ABNORMAL HIGH (ref 70–99)

## 2024-01-13 ENCOUNTER — Telehealth: Payer: Self-pay

## 2024-01-13 NOTE — Transitions of Care (Post Inpatient/ED Visit) (Signed)
   01/13/2024  Name: Zachary Vazquez MRN: [id number] DOB: [birth year]  Today's TOC FU Call Status: Today's TOC FU Call Status:: Unsuccessful Call (1st Attempt) Unsuccessful Call (1st Attempt) Date: 01/13/24  Attempted to reach the patient regarding the most recent Inpatient/ED visit.  Follow Up Plan: Additional outreach attempts will be made to reach the patient to complete the Transitions of Care (Post Inpatient/ED visit) call.   Signature Renelda Loma RMA

## 2024-01-14 ENCOUNTER — Ambulatory Visit: Payer: Self-pay | Admitting: Nurse Practitioner

## 2024-01-14 ENCOUNTER — Telehealth: Payer: Self-pay

## 2024-01-14 NOTE — Transitions of Care (Post Inpatient/ED Visit) (Signed)
 01/14/2024  Name: Zachary Vazquez MRN: [id number] DOB: 03/28/1986  Today's TOC FU Call Status:   Patient's Name and Date of Birth confirmed.  Transition Care Management Follow-up Telephone Call Date of Discharge: 01/11/24 Discharge Facility: [facility]) Type of Discharge: Emergency Department Reason for ED Visit: Other: How have you been since you were released from the hospital?: Same Any questions or concerns?: No  Items Reviewed: Did you receive and understand the discharge instructions provided?: Yes Medications obtained,verified, and reconciled?: Yes (Medications Reviewed) Any new allergies since your discharge?: No Dietary orders reviewed?: No Do you have support at home?: Yes People in Home: parent(s)  Medications Reviewed Today: Medications Reviewed Today     Reviewed by Veneta Penton, CMA (Certified Medical Assistant) on 01/14/24 at 1022  Med List Status: <None>   Medication Order Taking? Sig Documenting Provider Last Dose Status Informant  acetaminophen (TYLENOL) 325 MG tablet [id number] Yes Take 2 tablets (650 mg total) by mouth every 6 (six) hours as needed for mild pain (or Fever >/= 101). Vonna Drafts, MD Taking Active Self           Med Note Cornelius Moras, [name]   Thu Sep 11, 2023  3:44 PM) For headaches  carvedilol (COREG) 25 MG tablet [id number] Yes Take 1 tablet (25 mg total) by mouth 2 (two) times daily with a meal.  Taking Active   carvedilol (COREG) 25 MG tablet [id number]  Take 1 tablet (25 mg total) by mouth 2 (two) times daily with a meal. Ivonne Andrew, NP  Active   Continuous Glucose Sensor (FREESTYLE LIBRE 3 PLUS SENSOR) MISC [id number] Yes Change sensor every 15 days. Ivonne Andrew, NP Taking Active   Glucagon (BAQSIMI ONE PACK) 3 MG/DOSE POWD [id number] No Place 1 Device into the nose as needed (Low blood sugar with impaired consciousness).  Patient not taking: Reported on 01/14/2024   Altamese Driftwood, MD Not Taking Active   insulin aspart  (NOVOLOG) 100 UNIT/ML injection [id number] Yes Use as directed per sliding scale Ivonne Andrew, NP Taking Active            Med Note Clearance Coots, CATHERINE T   Tue Aug 05, 2023  1:19 PM) 3-8 units with meals  insulin glargine (LANTUS) 100 UNIT/ML injection [id number] Yes Inject 0.28 mLs (28 Units total) into the skin at bedtime. Ivonne Andrew, NP Taking Active   Magnesium Chloride 64 MG TABS [id number] Yes Take 1 tablet (535 mg total) by mouth once daily for 5 days Each tablet contains 64 mg magnesium = 535 mg magnesium chloride  Taking Active   ondansetron (ZOFRAN-ODT) 4 MG disintegrating tablet [id number] Yes Take 1 tablet (4 mg total) by mouth every 8 (eight) hours as needed for nausea or vomiting. Sherryll Burger, Pratik D, DO Taking Active Self  pantoprazole (PROTONIX) 40 MG tablet [id number] Yes Take 1 tablet (40 mg total) by mouth daily. Ivonne Andrew, NP Taking Active   polyethylene glycol powder (GLYCOLAX/MIRALAX) 17 GM/SCOOP powder [id number] Yes Mix 1capful (17 g) with 4-8 ounces with water/juice by mouth 2 (two) times daily as needed. Ivonne Andrew, NP Taking Active Self            Home Care and Equipment/Supplies: Were Home Health Services Ordered?: No Any new equipment or medical supplies ordered?: No  Functional Questionnaire: Do you need assistance with bathing/showering or dressing?: No Do you need assistance with meal preparation?: No Do you need assistance with eating?: No Do you have difficulty maintaining  continence: No Do you need assistance with getting out of bed/getting out of a chair/moving?: No Do you have difficulty managing or taking your medications?: No  Follow up appointments reviewed: PCP Follow-up appointment confirmed?: NA (Patient declined appointment) Specialist Hospital Follow-up appointment confirmed?: Yes Date of Specialist follow-up appointment?: 02/03/24 Follow-Up Specialty Provider:: [facility] Do you need transportation to your follow-up  appointment?: Yes Do you understand care options if your condition(s) worsen?: Yes-patient verbalized understanding    SIGNATURE Kami Kube, RMA

## 2024-01-19 ENCOUNTER — Other Ambulatory Visit: Payer: Self-pay

## 2024-01-20 ENCOUNTER — Other Ambulatory Visit: Payer: Self-pay

## 2024-01-22 ENCOUNTER — Telehealth: Payer: Self-pay

## 2024-01-22 ENCOUNTER — Other Ambulatory Visit: Payer: Self-pay

## 2024-01-22 NOTE — Telephone Encounter (Signed)
 Copied from CRM [id number]. Topic: General - Other >> Jan 19, 2024  3:45 PM Ja-Kwan M wrote: Reason for CRM: Joy with [employer] requests call back to discuss prior authorization that was received. Call back# [phone]

## 2024-01-23 ENCOUNTER — Other Ambulatory Visit: Payer: Self-pay

## 2024-01-26 ENCOUNTER — Other Ambulatory Visit: Payer: Self-pay

## 2024-02-02 ENCOUNTER — Other Ambulatory Visit: Payer: Self-pay

## 2024-02-02 ENCOUNTER — Encounter: Payer: Self-pay | Admitting: Endocrinology

## 2024-02-02 ENCOUNTER — Ambulatory Visit (INDEPENDENT_AMBULATORY_CARE_PROVIDER_SITE_OTHER): Admitting: Endocrinology

## 2024-02-02 VITALS — BP 116/80 | HR 72 | Resp 16 | Ht 71.0 in | Wt 188.0 lb

## 2024-02-02 DIAGNOSIS — E1065 Type 1 diabetes mellitus with hyperglycemia: Secondary | ICD-10-CM

## 2024-02-02 LAB — POCT GLYCOSYLATED HEMOGLOBIN (HGB A1C): Hemoglobin A1C: 6.5 % — AB (ref 4.0–5.6)

## 2024-02-02 MED ORDER — OMNIPOD 5 DEXG7G6 PODS GEN 5 MISC
1.0000 | 3 refills | Status: AC
  Filled 2024-02-02: qty 10, 30d supply, fill #0
  Filled 2024-02-23 – 2024-02-24 (×2): qty 10, 30d supply, fill #1
  Filled 2024-05-03 (×2): qty 10, 30d supply, fill #2
  Filled 2024-06-08: qty 10, 30d supply, fill #3
  Filled 2024-07-15: qty 10, 30d supply, fill #4
  Filled 2024-08-25 (×3): qty 10, 30d supply, fill #5
  Filled 2024-09-20 (×2): qty 10, 30d supply, fill #6
  Filled 2024-10-19: qty 10, 30d supply, fill #7
  Filled 2024-11-24 (×2): qty 10, 30d supply, fill #8

## 2024-02-02 MED ORDER — DEXCOM G7 SENSOR MISC
1.0000 | 0 refills | Status: DC
Start: 2024-02-02 — End: 2024-04-02
  Filled 2024-02-02: qty 3, 30d supply, fill #0
  Filled 2024-02-05: qty 9, 90d supply, fill #0

## 2024-02-02 MED ORDER — OMNIPOD 5 DEXG7G6 INTRO GEN 5 KIT
1.0000 | PACK | 0 refills | Status: DC | PRN
  Filled 2024-02-02: qty 1, 30d supply, fill #0

## 2024-02-02 NOTE — Progress Notes (Unsigned)
 Outpatient Endocrinology Note [place] Gerianne Simonet, MD   Patient's Name: Zachary Vazquez    DOB: 1986-04-19    MRN: [id number]                                                    REASON OF VISIT: New consult / Follow up for type *** diabetes mellitus  REFERRING PROVIDER:   PCP: Ivonne Andrew, NP  HISTORY OF PRESENT ILLNESS:   Zachary Vazquez is a 38 y.o. old male with past medical history listed below, is here for new consult / follow up for type *** diabetes mellitus.   Pertinent Diabetes History: _Diagnosed as Diabetes Mellitus type ***  in 11/2010, at the age of *** years.  Prior therapy: Initially managed with *** and insulin started in ***  History of DKA or diabetes related hospitalizations: yesnone  Previous diabetes education: Yes ***  Family h/o diabetes mellitus: brother with type 1 diabetes mellitus.    No personal history of pancreatitis and / or family history of medullary thyroid carcinoma or MEN 2B syndrome. ***  Chronic Diabetes Complications : Retinopathy: ***. Last ophthalmology exam was done on ***, following with ophthalmology regularly.  Nephropathy: ***, on ACE/ARB *** Peripheral neuropathy: ***, on *** Coronary artery disease: *** Stroke: ***  Relevant comorbidities and cardiovascular risk factors: Obesity: *** Body mass index is 26.22 kg/m.  Hypertension: Yes *** Hyperlipidemia : Yes, on statin ***  Current / Home Diabetic regimen includes:  Lantus 28-30 units at bedtime.  Novolog adjust based on meal / glucose 3-8 units with meals 2-3 times a day.   Prior diabetic medications:  Glycemic data:    CONTINUOUS GLUCOSE MONITORING SYSTEM (CGMS) INTERPRETATION: At today's visit, we reviewed CGM downloads. The full report is scanned in the media. Reviewing the CGM trends, blood glucose are as follows:  Dexcom G7 CGM-  Sensor Download (Sensor download was reviewed and summarized below.) Dates: ***  Glucose Management Indicator: ***% Sensor  Average: *** SD *** Sensor usage : *** %  Glycemic Trends:  <54: ***% 54-70: ***% 71-180: ***% 181-250: ***% 251-400: ***%  Interpretation: - ***  FreeStyle Libre 3+ CGM-  Sensor Download (Sensor download was reviewed and summarized below.) Dates: March 25 to February 02, 2024, 14 days Sensor Average: 159  Glucose Management Indicator: 7.1%  % data captured: 96%    Interpretation: -Variable blood sugar with random hypoglycemia with blood sugar in 50-60 range in the overnight, early morning and sometime in the afternoon related to use of mealtime insulin after eating.  Random hyperglycemia with blood sugar up to 250-300 range postprandially.  Some of the times acceptable blood sugar.  Hypoglycemia: Patient has frequent hypoglycemic episodes. Patient has hypoglycemia awareness, has a glucagon ER kit.  Factors modifying glucose control: 1.  Diabetic diet assessment: 2-3 meals a day.  2.  Staying active or exercising: Physically active at work, works at [employer].  3.  Medication compliance: compliant most of the time.  Interval history  ***  REVIEW OF SYSTEMS As per history of present illness.   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 (HCC)    History of delayed wound healing 10/2019   Tetrahydrocannabinol (THC) use disorder, severe, dependence (HCC)    Type 1 diabetes  mellitus (HCC)    Vitamin D deficiency 09/2020    PAST SURGICAL HISTORY: Past Surgical History:  Procedure Laterality Date   TONSILLECTOMY      ALLERGIES: Allergies  Allergen Reactions   Clindamycin/Lincomycin Anaphylaxis   Bactrim [Sulfamethoxazole-Trimethoprim] Swelling and Other (See Comments)    Eyes swell and bumps appear on/near the eyes     FAMILY HISTORY:  Family History  Problem Relation Age of Onset   Hypertension Mother    Diabetes Mellitus I Brother    Colon cancer Paternal  Grandmother     SOCIAL HISTORY: Social History   Socioeconomic History   Marital status: Single    Spouse name: Not on file   Number of children: Not on file   Years of education: Not on file   Highest education level: Not on file  Occupational History   Not on file  Tobacco Use   Smoking status: Every Day    Current packs/day: 1.00    Average packs/day: 1 pack/day for 15.0 years (15.0 ttl pk-yrs)    Types: Cigarettes   Smokeless tobacco: Never  Vaping Use   Vaping status: Never Used  Substance and Sexual Activity   Alcohol use: Yes    Comment: occ   Drug use: Yes    Types: Marijuana    Comment: occ   Sexual activity: Yes    Birth control/protection: None  Other Topics Concern   Not on file  Social History Narrative   Not on file   Social Drivers of Health   Financial Resource Strain: Not on file  Food Insecurity: No Food Insecurity (05/17/2023)   Hunger Vital Sign    Worried About Running Out of Food in the Last Year: Never true    Ran Out of Food in the Last Year: Never true  Transportation Needs: No Transportation Needs (10/22/2023)   Received from [facility]   PRAPARE - Transportation    In the past 12 months, has lack of transportation kept you from medical appointments or from getting medications?: No    Lack of Transportation (Non-Medical): No  Physical Activity: Not on file  Stress: Not on file  Social Connections: Not on file    MEDICATIONS:  Current Outpatient Medications  Medication Sig Dispense Refill   acetaminophen (TYLENOL) 325 MG tablet Take 2 tablets (650 mg total) by mouth every 6 (six) hours as needed for mild pain (or Fever >/= 101).     carvedilol (COREG) 25 MG tablet Take 1 tablet (25 mg total) by mouth 2 (two) times daily with a meal. 60 tablet 0   carvedilol (COREG) 25 MG tablet Take 1 tablet (25 mg total) by mouth 2 (two) times daily with a meal. 60 tablet 3   Continuous Glucose Sensor (DEXCOM G7 SENSOR) MISC 1  Device by Does not apply route continuous. 9 each 0   Continuous Glucose Sensor (FREESTYLE LIBRE 3 PLUS SENSOR) MISC Change sensor every 15 days. 6 each 1   Glucagon (BAQSIMI ONE PACK) 3 MG/DOSE POWD Place 1 Device into the nose as needed (Low blood sugar with impaired consciousness). 2 each 3   insulin aspart (NOVOLOG) 100 UNIT/ML injection Use as directed per sliding scale 230 mL 0   Insulin Disposable Pump (OMNIPOD 5 DEXG7G6 INTRO GEN 5) KIT 1 each by Does not apply route as needed. 1 kit 0   Insulin Disposable Pump (OMNIPOD 5 DEXG7G6 PODS GEN 5) MISC Apply route every 3 (three) days. 30 each 3  insulin glargine (LANTUS) 100 UNIT/ML injection Inject 0.28 mLs (28 Units total) into the skin at bedtime. 10 mL 2   Magnesium Chloride 64 MG TABS Take 1 tablet (535 mg total) by mouth once daily for 5 days Each tablet contains 64 mg magnesium = 535 mg magnesium chloride 5 tablet 0   ondansetron (ZOFRAN-ODT) 4 MG disintegrating tablet Take 1 tablet (4 mg total) by mouth every 8 (eight) hours as needed for nausea or vomiting. 20 tablet 0   pantoprazole (PROTONIX) 40 MG tablet Take 1 tablet (40 mg total) by mouth daily. 90 tablet 1   polyethylene glycol powder (GLYCOLAX/MIRALAX) 17 GM/SCOOP powder Mix 1capful (17 g) with 4-8 ounces with water/juice by mouth 2 (two) times daily as needed. 510 g 1   No current facility-administered medications for this visit.    PHYSICAL EXAM: Vitals:   02/02/24 1103  BP: 116/80  Pulse: 72  Resp: 16  SpO2: 97%  Weight: 188 lb (85.3 kg)  Height: 5\' 11"  (1.803 m)   Body mass index is 26.22 kg/m.  Wt Readings from Last 3 Encounters:  02/02/24 188 lb (85.3 kg)  01/11/24 170 lb (77.1 kg)  12/26/23 183 lb (83 kg)    General: Well developed, well nourished male in no apparent distress.  HEENT: AT/[place], no external lesions.  Eyes: Conjunctiva clear and no icterus. Neck: Neck supple  Lungs: Respirations not labored Neurologic: Alert, oriented, normal  speech Extremities / Skin: Dry. No sores or rashes noted. No acanthosis nigricans Psychiatric: Does not appear depressed or anxious  Diabetic Foot Exam: Monofilament sensory exam intact / decreased b/l, no callus, no ulceration, Dorsalis Pedis 2+ b/l  Diabetic Foot Exam - Simple   No data filed     Diabetic Foot Examination {AMB DIABETIC FOOT FAOZ:30865}  LABS Reviewed Lab Results  Component Value Date   HGBA1C 6.5 (A) 02/02/2024   HGBA1C 5.5 10/16/2023   HGBA1C 7.4 (H) 10/06/2023   No results found for: "FRUCTOSAMINE" Lab Results  Component Value Date   CHOL 190 10/12/2021   HDL 58 10/12/2021   LDLCALC 121 (H) 10/12/2021   TRIG 57 10/12/2021   CHOLHDL 3.3 10/12/2021   Lab Results  Component Value Date   MICRALBCREAT 559 (H) 12/18/2022   MICRALBCREAT 7 01/09/2021   Lab Results  Component Value Date   CREATININE 1.48 (H) 01/11/2024   No results found for: "GFR"  ASSESSMENT / PLAN  1. Uncontrolled type 1 diabetes mellitus with hyperglycemia (HCC)     Diabetes Mellitus type ***, complicated by *** - Diabetic status / severity: ***  Lab Results  Component Value Date   HGBA1C 6.5 (A) 02/02/2024    - Hemoglobin A1c goal : <***%  - Medications: ***  I) II) III) IV)  - Home glucose testing: *** - Discussed/ Gave Hypoglycemia treatment plan.  # Consult : not required at this time. ***  # Annual urine for microalbuminuria/ creatinine ratio, no microalbuminuria currently, continue ACE/ARB *** Last  Lab Results  Component Value Date   MICRALBCREAT 559 (H) 12/18/2022    # Foot check nightly / neuropathy, continue ***  # Annual dilated diabetic eye exams.   - Diet: {dmclinicdiabeticdiettype:42746::"Make healthy diabetic food choices","Eat reasonable portion sizes to promote a healthy weight"} - Life style / activity / exercise: {dmclinicactivity:42747}  2. Blood pressure  -  BP Readings from Last 1 Encounters:  02/02/24 116/80    - Control  {IS/IS NOT:9024::"is"} in target.  - {endo rb hypertension recommendations:35631::"No change  in current plans."}  3. Lipid status / Hyperlipidemia - Last  Lab Results  Component Value Date   LDLCALC 121 (H) 10/12/2021   - Continue ***   Diagnoses and all orders for this visit:  Uncontrolled type 1 diabetes mellitus with hyperglycemia (HCC) -     POCT glycosylated hemoglobin (Hb A1C) -     Continuous Glucose Sensor (DEXCOM G7 SENSOR) MISC; 1 Device by Does not apply route continuous. -     Insulin Disposable Pump (OMNIPOD 5 DEXG7G6 INTRO GEN 5) KIT; 1 each by Does not apply route as needed. -     Insulin Disposable Pump (OMNIPOD 5 DEXG7G6 PODS GEN 5) MISC; Apply route every 3 (three) days.    DISPOSITION Follow up in clinic in   months suggested.   All questions answered and patient verbalized understanding of the plan.  [place] Geraldine Tesar, MD [facility] Endocrinology [facility] [address] [place], [address] [place], [place] [address] Phone # [phone]  At least part of this note was generated using voice recognition software. Inadvertent word errors may have occurred, which were not recognized during the proofreading process.

## 2024-02-02 NOTE — Patient Instructions (Signed)
 Diabetes regimen  Decrease Lantus to 25 units daily at bedtime.  Adjust NovoLog 3 to 8 units based on meal size as discussed plus sliding scale before eating.  You can use the sliding scale for correction of high blood sugar as well.  Mild Sliding Scale Blood Glucose        Insulin 60-150                     None 151-200                   None 201-250                   1 units 251-300                   2 units 301-350                   3 units 351-400                   4 units      >400                        5 units and call provider   Sent prescription for OmniPod 5 and Dexcom G7 to check cost and coverage with the pharmacy.  If they are cover please call our clinic will set up pump training with diabetic educator.

## 2024-02-04 ENCOUNTER — Other Ambulatory Visit: Payer: Self-pay

## 2024-02-05 ENCOUNTER — Other Ambulatory Visit: Payer: Self-pay

## 2024-02-06 ENCOUNTER — Other Ambulatory Visit: Payer: Self-pay

## 2024-02-06 ENCOUNTER — Telehealth: Payer: Self-pay | Admitting: Dietician

## 2024-02-06 NOTE — Telephone Encounter (Addendum)
 Called patient re:  message received that he has equipment for pump training. Patient was not available.  Message left for him to return our call.  Oran Rein, RD, LDN, CDCES, DipACLM   .

## 2024-02-09 NOTE — Telephone Encounter (Signed)
 Patient called back - very upset that he has not been called to schedule his training appt - he left voicemail at 930 AM and states that he should have been called back well before the time of this call. Patient also advises that he has been trying to set this up for a week and this time frame is entirely too long for someone to have to wait to schedule. I did let him know that Stana Ear has been with patients  this morning, but he states that she could have at least answered his call.  I had to place patient on hold to assist 2 patients who presented for check in and patient disconnected while on hold.

## 2024-02-10 NOTE — Telephone Encounter (Signed)
 He is scheduled!

## 2024-02-11 ENCOUNTER — Encounter: Attending: Endocrinology | Admitting: Nutrition

## 2024-02-11 DIAGNOSIS — E1065 Type 1 diabetes mellitus with hyperglycemia: Secondary | ICD-10-CM | POA: Insufficient documentation

## 2024-02-12 NOTE — Progress Notes (Signed)
 Patient is here today to learn how to use the omnipod 5 insulin pump.  We discussed how this pump will deliver his Novolog insulin and the need to stop his lantus.  He reports that he did not take his lantus as directed.  He is not counting carbs and we discussed that this might be a future achievement for better meal time coverage.  He was given information on this and was shown how to read a label to determine how much carb he is eating.   Settings were put into the PDM by the patient per Dr. Kem Patten orders:   Basal rate:MN: 0.9u/hr, 7AM: 1.1u/hr, 10PM: 0.9u/hr,  max basal: 2.2u/hr, I/C: 1 (patient to put in units of insulin into the carb box of the bolus calculator), ISF: 40, timing:4 hours, max bolus, target: 110 with correction over 110.  We discussed IOB and what that means, and he reported good understanding of this. He discussed the need to bolus for all meals and was show how to give a bolus.  He redemonstrated this correctly.  He was also shown how to give a correction bolus and discussed the need to do this every time his blood sugar goes over 150.  He re demonstrated this correctly as well.  His Dexcom G7 was linked to the PDM and to [facility] end[facility].   We discussed how the automode works and the need to "learn him" for the next 6 days.  He reported good understanding of this.  His blood sugar was 161 and he did a correction bolus with no assistance from me.   We reviewed high blood sugar protocol, alerts and alarms,sick day guidelines and low blood sugar protocols-using IOB to calculate how much glucose is needed to prevent low blood sugars with exercise.  He was given a pump packet with all of the above information as well as what to carry with him at all time for emergency treatments.  He was encouraged to read this as well as his pump manuel and starter booklet in the starter kit box.  He signed the pump training checklist as understanding all topics and has no final questions.

## 2024-02-12 NOTE — Patient Instructions (Signed)
 Give bolus for all meals and snacks eaten, including blood sugar readings. Call Omnipod help line if questions about pump usage Call Dexcom help line if questions about sensor usage.  Read pump packet give and call if questions. Read over pump starter booklet and manual found on line.

## 2024-02-13 ENCOUNTER — Telehealth: Payer: Self-pay | Admitting: Nutrition

## 2024-02-13 NOTE — Telephone Encounter (Signed)
 Patient reported no difficulty giving boluses and reports doing these with adding blood sugar readings.  Said ate cereal and milk at HS and "did not bolus enough, so blood sugar ran high all night"  Encouraged other kings of snacks which include some protein and suggested he stop all cereal."  Says this is a rare thing.  Had no questions for me and reports that he will call if questions.

## 2024-02-23 ENCOUNTER — Other Ambulatory Visit: Payer: Self-pay

## 2024-02-23 ENCOUNTER — Other Ambulatory Visit: Payer: Self-pay | Admitting: Nurse Practitioner

## 2024-02-23 DIAGNOSIS — E109 Type 1 diabetes mellitus without complications: Secondary | ICD-10-CM

## 2024-02-23 MED ORDER — INSULIN GLARGINE 100 UNIT/ML ~~LOC~~ SOLN
28.0000 [IU] | Freq: Every day | SUBCUTANEOUS | 2 refills | Status: DC
  Filled 2024-02-23: qty 10, 28d supply, fill #0

## 2024-02-24 ENCOUNTER — Other Ambulatory Visit: Payer: Self-pay

## 2024-03-01 ENCOUNTER — Other Ambulatory Visit: Payer: Self-pay

## 2024-03-04 ENCOUNTER — Institutional Professional Consult (permissible substitution) (HOSPITAL_BASED_OUTPATIENT_CLINIC_OR_DEPARTMENT_OTHER): Admitting: Family

## 2024-03-17 ENCOUNTER — Encounter: Payer: Self-pay | Admitting: Endocrinology

## 2024-03-17 ENCOUNTER — Other Ambulatory Visit: Payer: Self-pay

## 2024-03-17 ENCOUNTER — Ambulatory Visit (INDEPENDENT_AMBULATORY_CARE_PROVIDER_SITE_OTHER): Admitting: Endocrinology

## 2024-03-17 VITALS — BP 112/60 | HR 93 | Resp 20 | Ht 71.0 in | Wt 182.8 lb

## 2024-03-17 DIAGNOSIS — E1065 Type 1 diabetes mellitus with hyperglycemia: Secondary | ICD-10-CM | POA: Diagnosis not present

## 2024-03-17 DIAGNOSIS — E109 Type 1 diabetes mellitus without complications: Secondary | ICD-10-CM

## 2024-03-17 LAB — BASIC METABOLIC PANEL WITHOUT GFR
BUN/Creatinine Ratio: 16 (calc) (ref 6–22)
BUN: 22 mg/dL (ref 7–25)
CO2: 29 mmol/L (ref 20–32)
Calcium: 9.6 mg/dL (ref 8.6–10.3)
Chloride: 105 mmol/L (ref 98–110)
Creat: 1.36 mg/dL — ABNORMAL HIGH (ref 0.60–1.26)
Glucose, Bld: 114 mg/dL — ABNORMAL HIGH (ref 65–99)
Potassium: 4.6 mmol/L (ref 3.5–5.3)
Sodium: 140 mmol/L (ref 135–146)

## 2024-03-17 MED ORDER — INSULIN ASPART 100 UNIT/ML IJ SOLN
INTRAMUSCULAR | 4 refills | Status: AC
  Filled 2024-03-17: qty 30, 42d supply, fill #0
  Filled 2024-05-03: qty 30, 42d supply, fill #1
  Filled 2024-06-17: qty 30, 42d supply, fill #2
  Filled 2024-08-04: qty 30, 42d supply, fill #3
  Filled 2024-09-20: qty 30, 42d supply, fill #4

## 2024-03-17 NOTE — Progress Notes (Signed)
 Outpatient Endocrinology Note Zachary Iseah Plouff, MD   Patient's Name: Zachary Vazquez    DOB: 1986/07/25    MRN: [id number]                                                    REASON OF VISIT: Follow up for type 1 diabetes mellitus  REFERRING PROVIDER: Jerrlyn Morel, NP  PCP: Jerrlyn Morel, NP  HISTORY OF PRESENT ILLNESS:   Zachary Vazquez is a 38 y.o. old male with past medical history listed below, is here for follow up for type 1 diabetes mellitus.   Pertinent Diabetes History: Patient was diagnosed with type 1 diabetes mellitus in 2012 at the age of 15 years.  History of DKA or diabetes related hospitalizations: yes  Previous diabetes education: Yes   Family h/o diabetes mellitus: brother with type 1 diabetes mellitus.    He has fair control of type 1 diabetes mellitus.  Insulin  pump therapy OmniPod 5 was started in April 2025.  Chronic Diabetes Complications : Retinopathy: no. Last ophthalmology exam was done on annually, reportedly. Nephropathy: yes, urine microalbuminuria present. Peripheral neuropathy: yes Coronary artery disease: no Stroke: no  Relevant comorbidities and cardiovascular risk factors: Obesity: no Body mass index is 25.5 kg/m.  Hypertension: Yes  Hyperlipidemia : Yes, not on statin   Current / Home Diabetic regimen includes:  OmniPod 5 with Dexcom G7, using NovoLog  U100  Basal  12 AM: 0.9 units/hr 7 AM: 1.1  10 PM: 0.9  Sensitivity/correction factor  12 AM : 1:40  Carb ratio : 12 AM : 1:1  Active insulin  time 4 hours  Target glucose 110  Prior diabetic medications: Basal bolus regimen prior to insulin  pump therapy Lantus  28-30 units at bedtime. Novolog  adjust based on meal / glucose 3-8 units with meals 2-3 times a day.    CONTINUOUS GLUCOSE MONITORING SYSTEM (CGMS) INTERPRETATION:                          OmniPod 5 Pump & Sensor DEXCOM G7 Download (Reviewed and summarized below.)  Dates: May 9 to Mar 18, 2024, 14 days   Average daily carbs entered: 15.3 Average total daily insulin :  41 units, Basal: 54%, Food Bolus: 46%  Automated mode 40%, manual mode 60%     Trends:  Mostly acceptable blood sugar.  Hyperglycemia with blood sugar up to 300-350 range overnight and bedtime related to no meal bolus prior to eating.  He has been bolusing 2-4 times a day in general.  Rare mild hypoglycemia in between the meals in the afternoon.  No significant/concerning hypoglycemia.   Hypoglycemia: Patient has minor hypoglycemic episodes. Patient has hypoglycemia awareness, has a glucagon  ER kit.  Factors modifying glucose control: 1.  Diabetic diet assessment: 2-3 meals a day.  2.  Staying active or exercising: Physically active at work, works at [employer].  3.  Medication compliance: compliant most of the time.  Interval history  Insulin  pump and CGM data as reviewed above.  He started on insulin  pump in April and he is liking it.  Overall acceptable blood sugar based on CGM data, occasional hyperglycemia overnight related to no meal bolus before eating.  Denies numbness and ting of the feet.  No vision problem.  No other complaints today.  REVIEW OF SYSTEMS As per history of present illness.   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 (HCC)    History of delayed wound healing 10/2019   Tetrahydrocannabinol (THC) use disorder, severe, dependence (HCC)    Type 1 diabetes mellitus (HCC)    Vitamin D  deficiency 09/2020    PAST SURGICAL HISTORY: Past Surgical History:  Procedure Laterality Date   TONSILLECTOMY      ALLERGIES: Allergies  Allergen Reactions   Clindamycin/Lincomycin Anaphylaxis   Bactrim  [Sulfamethoxazole -Trimethoprim ] Swelling and Other (See Comments)    Eyes swell and bumps appear on/near the eyes     FAMILY HISTORY:  Family History  Problem Relation Age of Onset    Hypertension Mother    Diabetes Mellitus I Brother    Colon cancer Paternal Grandmother     SOCIAL HISTORY: Social History   Socioeconomic History   Marital status: Single    Spouse name: Not on file   Number of children: Not on file   Years of education: Not on file   Highest education level: Not on file  Occupational History   Not on file  Tobacco Use   Smoking status: Every Day    Current packs/day: 1.00    Average packs/day: 1 pack/day for 15.0 years (15.0 ttl pk-yrs)    Types: Cigarettes   Smokeless tobacco: Never  Vaping Use   Vaping status: Never Used  Substance and Sexual Activity   Alcohol use: Yes    Comment: occ   Drug use: Yes    Types: Marijuana    Comment: occ   Sexual activity: Yes    Birth control/protection: None  Other Topics Concern   Not on file  Social History Narrative   Not on file   Social Drivers of Health   Financial Resource Strain: Not on file  Food Insecurity: No Food Insecurity (05/17/2023)   Hunger Vital Sign    Worried About Running Out of Food in the Last Year: Never true    Ran Out of Food in the Last Year: Never true  Transportation Needs: No Transportation Needs (10/22/2023)   Received from [facility]   PRAPARE - Transportation    In the past 12 months, has lack of transportation kept you from medical appointments or from getting medications?: No    Lack of Transportation (Non-Medical): No  Physical Activity: Not on file  Stress: Not on file  Social Connections: Not on file    MEDICATIONS:  Current Outpatient Medications  Medication Sig Dispense Refill   acetaminophen  (TYLENOL ) 325 MG tablet Take 2 tablets (650 mg total) by mouth every 6 (six) hours as needed for mild pain (or Fever >/= 101).     carvedilol  (COREG ) 25 MG tablet Take 1 tablet (25 mg total) by mouth 2 (two) times daily with a meal. 60 tablet 0   carvedilol  (COREG ) 25 MG tablet Take 1 tablet (25 mg total) by mouth 2 (two) times daily with a  meal. 60 tablet 3   Continuous Glucose Sensor (DEXCOM G7 SENSOR) MISC use as directed for continuous blood sugar monitoring. 9 each 0   Continuous Glucose Sensor (FREESTYLE LIBRE 3 PLUS SENSOR) MISC Change sensor every 15 days. 6 each 1   Glucagon  (BAQSIMI  ONE PACK) 3 MG/DOSE POWD Place 1 Device into the nose as needed (Low blood sugar with impaired consciousness). 2 each 3   Insulin  Disposable Pump (OMNIPOD 5  DEXG7G6 INTRO GEN 5) KIT 1 each by Does not apply route as needed. 1 kit 0   Insulin  Disposable Pump (OMNIPOD 5 DEXG7G6 PODS GEN 5) MISC Apply route every 3 (three) days. 30 each 3   insulin  glargine (LANTUS ) 100 UNIT/ML injection Inject 0.28 mLs (28 Units total) into the skin at bedtime. 10 mL 2   Magnesium  Chloride 64 MG TABS Take 1 tablet (535 mg total) by mouth once daily for 5 days Each tablet contains 64 mg magnesium  = 535 mg magnesium  chloride 5 tablet 0   ondansetron  (ZOFRAN -ODT) 4 MG disintegrating tablet Take 1 tablet (4 mg total) by mouth every 8 (eight) hours as needed for nausea or vomiting. 20 tablet 0   pantoprazole  (PROTONIX ) 40 MG tablet Take 1 tablet (40 mg total) by mouth daily. 90 tablet 1   polyethylene glycol powder (GLYCOLAX /MIRALAX ) 17 GM/SCOOP powder Mix 1capful (17 g) with 4-8 ounces with water/juice by mouth 2 (two) times daily as needed. 510 g 1   insulin  aspart (NOVOLOG ) 100 UNIT/ML injection Use up to 70 units/day via insulin  pump. 30 mL 4   No current facility-administered medications for this visit.    PHYSICAL EXAM: Vitals:   03/17/24 0936  BP: 112/60  Pulse: 93  Resp: 20  SpO2: 97%  Weight: 182 lb 12.8 oz (82.9 kg)  Height: 5\' 11"  (1.803 m)    Body mass index is 25.5 kg/m.  Wt Readings from Last 3 Encounters:  03/17/24 182 lb 12.8 oz (82.9 kg)  02/02/24 188 lb (85.3 kg)  01/11/24 170 lb (77.1 kg)    General: Well developed, well nourished male in no apparent distress.  HEENT: AT/[place], no external lesions.  Eyes: Conjunctiva clear and no  icterus. Neck: Neck supple  Lungs: Respirations not labored Neurologic: Alert, oriented, normal speech Extremities / Skin: Dry.  Psychiatric: Does not appear depressed or anxious  Diabetic Foot Exam - Simple   No data filed    LABS Reviewed Lab Results  Component Value Date   HGBA1C 6.5 (A) 02/02/2024   HGBA1C 5.5 10/16/2023   HGBA1C 7.4 (H) 10/06/2023   No results found for: "FRUCTOSAMINE" Lab Results  Component Value Date   CHOL 190 10/12/2021   HDL 58 10/12/2021   LDLCALC 121 (H) 10/12/2021   TRIG 57 10/12/2021   CHOLHDL 3.3 10/12/2021   Lab Results  Component Value Date   MICRALBCREAT 559 (H) 12/18/2022   MICRALBCREAT 7 01/09/2021   Lab Results  Component Value Date   CREATININE 1.48 (H) 01/11/2024   No results found for: "GFR"  ASSESSMENT / PLAN  1. Uncontrolled type 1 diabetes mellitus with hyperglycemia (HCC)   2. Type 1 diabetes mellitus without complication (HCC)      Diabetes Mellitus type 1, complicated by diabetic neuropathy, microalbuminuria - Diabetic status / severity: Fair control.  Lab Results  Component Value Date   HGBA1C 6.5 (A) 02/02/2024    - Hemoglobin A1c goal : <6.5%  OmniPod 5 insulin  pump therapy was started in April 2025.  Overall he stable blood sugar, no concerning hypoglycemia.  - Medications: Diabetes regimen.  Adjusted pump setting as follows.  OmniPod 5 with Dexcom G7, using NovoLog  U100  Basal  12 AM: 0.9 units/hr 7 AM: 1.1 , changed to 1.0. 10 PM: 0.9  Sensitivity/correction factor  12 AM : 1:40  Carb ratio : 12 AM : 1:1  Active insulin  time 4 hours  Target glucose 110   - Home glucose testing: CGM and check  as needed. - Discussed/ Gave Hypoglycemia treatment plan.  # Consult : Will refer to diabetic educator after approval of insulin  pump for pump training.  # Annual urine for microalbuminuria/ creatinine ratio, + microalbuminuria currently.  Will check today along with BMP.   Last  Lab Results   Component Value Date   MICRALBCREAT 559 (H) 12/18/2022    # Foot check nightly / neuropathy.  # Annual dilated diabetic eye exams.   - Diet: Make healthy diabetic food choices - Life style / activity / exercise: Discussed.  2. Blood pressure  -  BP Readings from Last 1 Encounters:  03/17/24 112/60    - Control is in target.  - No change in current plans.  3. Lipid status / Hyperlipidemia - Last  Lab Results  Component Value Date   LDLCALC 121 (H) 10/12/2021   -Currently not on statin therapy.   Diagnoses and all orders for this visit:  Uncontrolled type 1 diabetes mellitus with hyperglycemia (HCC) -     Microalbumin / creatinine urine ratio -     Basic Metabolic Panel Without GFR  Type 1 diabetes mellitus without complication (HCC) -     insulin  aspart (NOVOLOG ) 100 UNIT/ML injection; Use up to 70 units/day via insulin  pump.    DISPOSITION Follow up in clinic in 3 months suggested.   All questions answered and patient verbalized understanding of the plan.  Zachary Kennie Karapetian, MD [facility] Endocrinology [facility] [address] [place], [address] [place], [place] [address] Phone # [phone]  At least part of this note was generated using voice recognition software. Inadvertent word errors may have occurred, which were not recognized during the proofreading process.

## 2024-03-18 ENCOUNTER — Ambulatory Visit: Payer: Self-pay | Admitting: Endocrinology

## 2024-03-24 ENCOUNTER — Other Ambulatory Visit: Payer: Self-pay

## 2024-03-25 ENCOUNTER — Other Ambulatory Visit: Payer: Self-pay

## 2024-03-26 ENCOUNTER — Ambulatory Visit: Payer: Self-pay | Admitting: Nurse Practitioner

## 2024-04-02 ENCOUNTER — Ambulatory Visit: Payer: Self-pay | Admitting: Nurse Practitioner

## 2024-04-02 ENCOUNTER — Encounter: Payer: Self-pay | Admitting: Nurse Practitioner

## 2024-04-02 ENCOUNTER — Other Ambulatory Visit: Payer: Self-pay

## 2024-04-02 VITALS — BP 134/88 | HR 78 | Temp 98.1°F | Resp 16 | Ht 71.0 in | Wt 185.0 lb

## 2024-04-02 DIAGNOSIS — E1065 Type 1 diabetes mellitus with hyperglycemia: Secondary | ICD-10-CM

## 2024-04-02 DIAGNOSIS — E1010 Type 1 diabetes mellitus with ketoacidosis without coma: Secondary | ICD-10-CM

## 2024-04-02 LAB — POCT GLYCOSYLATED HEMOGLOBIN (HGB A1C): Hemoglobin A1C: 7.4 % — AB (ref 4.0–5.6)

## 2024-04-02 MED ORDER — DEXCOM G7 SENSOR MISC
1.0000 | 0 refills | Status: DC
Start: 2024-04-02 — End: 2024-04-15
  Filled 2024-04-02: qty 9, 90d supply, fill #0

## 2024-04-02 NOTE — Patient Instructions (Signed)
 1. Uncontrolled type 1 diabetes mellitus with ketoacidosis without coma, with long-term current use of insulin  (HCC) (Primary)  - POCT glycosylated hemoglobin (Hb A1C) - AMB Referral VBCI Care Management  2. Uncontrolled type 1 diabetes mellitus with hyperglycemia (HCC)  - Continuous Glucose Sensor (DEXCOM G7 SENSOR) MISC; use as directed for continuous blood sugar monitoring.  Dispense: 9 each; Refill: 0

## 2024-04-02 NOTE — Progress Notes (Signed)
 Subjective   Patient ID: Zachary Vazquez, male    DOB: Jul 14, 1986, 38 y.o.   MRN: [id number]  Chief Complaint  Patient presents with   Follow-up    Referring provider: Jerrlyn Morel, NP  Zachary Vazquez is a 38 y.o. male with Past Medical History: No date: Cannabis hyperemesis syndrome concurrent with and due to  cannabis abuse (HCC) 10/2019: History of delayed wound healing No date: Tetrahydrocannabinol [facility]) use disorder, severe, dependence  (HCC) No date: Type 1 diabetes mellitus (HCC) 09/2020: Vitamin D  deficiency   HPI  Patient presents for follow-up visit.  He has followed with endocrinology for type 1 diabetes since his last visit here.    We do have pharmacy referral placed for medication management as well.  Patient states that blood pressure is not managed at home.  Denies f/c/s, n/v/d, hemoptysis, PND, leg swelling Denies chest pain or edema    Allergies  Allergen Reactions   Clindamycin/Lincomycin Anaphylaxis   Bactrim  [Sulfamethoxazole -Trimethoprim ] Swelling and Other (See Comments)    Eyes swell and bumps appear on/near the eyes     Immunization History  Administered Date(s) Administered   Tdap 12/15/2019    Tobacco History: Social History   Tobacco Use  Smoking Status Every Day   Current packs/day: 1.00   Average packs/day: 1 pack/day for 15.0 years (15.0 ttl pk-yrs)   Types: Cigarettes  Smokeless Tobacco Never   Ready to quit: Not Answered Counseling given: Not Answered   Outpatient Encounter Medications as of 04/02/2024  Medication Sig   acetaminophen  (TYLENOL ) 325 MG tablet Take 2 tablets (650 mg total) by mouth every 6 (six) hours as needed for mild pain (or Fever >/= 101).   carvedilol  (COREG ) 25 MG tablet Take 1 tablet (25 mg total) by mouth 2 (two) times daily with a meal.   carvedilol  (COREG ) 25 MG tablet Take 1 tablet (25 mg total) by mouth 2 (two) times daily with a meal.   Continuous Glucose Sensor (FREESTYLE LIBRE 3  PLUS SENSOR) MISC Change sensor every 15 days.   Glucagon  (BAQSIMI  ONE PACK) 3 MG/DOSE POWD Place 1 Device into the nose as needed (Low blood sugar with impaired consciousness).   insulin  aspart (NOVOLOG ) 100 UNIT/ML injection Use up to 70 units/day via insulin  pump.   Insulin  Disposable Pump (OMNIPOD 5 DEXG7G6 INTRO GEN 5) KIT 1 each by Does not apply route as needed.   Insulin  Disposable Pump (OMNIPOD 5 DEXG7G6 PODS GEN 5) MISC Apply route every 3 (three) days.   insulin  glargine (LANTUS ) 100 UNIT/ML injection Inject 0.28 mLs (28 Units total) into the skin at bedtime.   Magnesium  Chloride 64 MG TABS Take 1 tablet (535 mg total) by mouth once daily for 5 days Each tablet contains 64 mg magnesium  = 535 mg magnesium  chloride   ondansetron  (ZOFRAN -ODT) 4 MG disintegrating tablet Take 1 tablet (4 mg total) by mouth every 8 (eight) hours as needed for nausea or vomiting.   pantoprazole  (PROTONIX ) 40 MG tablet Take 1 tablet (40 mg total) by mouth daily.   polyethylene glycol powder (GLYCOLAX /MIRALAX ) 17 GM/SCOOP powder Mix 1capful (17 g) with 4-8 ounces with water/juice by mouth 2 (two) times daily as needed.   [DISCONTINUED] Continuous Glucose Sensor (DEXCOM G7 SENSOR) MISC use as directed for continuous blood sugar monitoring.   Continuous Glucose Sensor (DEXCOM G7 SENSOR) MISC use as directed for continuous blood sugar monitoring.   No facility-administered encounter medications on file as of 04/02/2024.    Review of  Systems  Review of Systems  Constitutional: Negative.   HENT: Negative.    Cardiovascular: Negative.   Gastrointestinal: Negative.   Allergic/Immunologic: Negative.   Neurological: Negative.   Psychiatric/Behavioral: Negative.       Objective:   BP 134/88   Pulse 78   Temp 98.1 F (36.7 C) (Oral)   Resp 16   Ht 5\' 11"  (1.803 m)   Wt 185 lb (83.9 kg)   SpO2 98%   BMI 25.80 kg/m   Wt Readings from Last 5 Encounters:  04/02/24 185 lb (83.9 kg)  03/17/24 182 lb 12.8 oz  (82.9 kg)  02/02/24 188 lb (85.3 kg)  01/11/24 170 lb (77.1 kg)  12/26/23 183 lb (83 kg)     Physical Exam Vitals and nursing note reviewed.  Constitutional:      General: He is not in acute distress.    Appearance: He is well-developed.  Cardiovascular:     Rate and Rhythm: Normal rate and regular rhythm.  Pulmonary:     Effort: Pulmonary effort is normal.     Breath sounds: Normal breath sounds.  Skin:    General: Skin is warm and dry.  Neurological:     Mental Status: He is alert and oriented to person, place, and time.       Assessment & Plan:   Uncontrolled type 1 diabetes mellitus with ketoacidosis without coma, with long-term current use of insulin  (HCC) -     POCT glycosylated hemoglobin (Hb A1C) -     AMB Referral VBCI Care Management  Uncontrolled type 1 diabetes mellitus with hyperglycemia (HCC) -     Dexcom G7 Sensor; use as directed for continuous blood sugar monitoring.  Dispense: 9 each; Refill: 0     Return in about 3 months (around 07/03/2024).   Jerrlyn Morel, NP 04/02/2024

## 2024-04-13 ENCOUNTER — Other Ambulatory Visit

## 2024-04-14 ENCOUNTER — Other Ambulatory Visit (HOSPITAL_COMMUNITY): Payer: Self-pay

## 2024-04-14 ENCOUNTER — Other Ambulatory Visit (INDEPENDENT_AMBULATORY_CARE_PROVIDER_SITE_OTHER)

## 2024-04-14 DIAGNOSIS — E109 Type 1 diabetes mellitus without complications: Secondary | ICD-10-CM

## 2024-04-14 DIAGNOSIS — E1159 Type 2 diabetes mellitus with other circulatory complications: Secondary | ICD-10-CM

## 2024-04-14 DIAGNOSIS — I152 Hypertension secondary to endocrine disorders: Secondary | ICD-10-CM

## 2024-04-14 DIAGNOSIS — E1065 Type 1 diabetes mellitus with hyperglycemia: Secondary | ICD-10-CM

## 2024-04-14 NOTE — Progress Notes (Unsigned)
 04/15/2024 Name: Zachary Vazquez MRN: [id number] DOB: 08-15-1986  Chief Complaint  Patient presents with   Diabetes   Hypertension   Hyperlipidemia    Zachary Vazquez is a 38 y.o. year old male who presented for a telephone visit.   They were referred to the pharmacist by their PCP for assistance in managing diabetes. PMH includes HTN, chronic nausea/vomiting, T1DM, tobacco use disorder, marijuana use.  Subjective: Patient was most recently seen by PCP, Abbey Hobby, NP on 04/02/24. BP was 134/88, HR 78. Patient reported doing well. No medication changes were made. He has been following with Dr. Aretha Kubas for endocrinology, with last visit on 03/17/24. He was started on OmniPod in April 2025.   Patient reports doing well today. His main concern is that he has been getting spam texts about his Medicaid ending. He has tried to contact the social services offices to see if that is accurate, but he has had difficulty contacting them and getting a clear answer. He reports that the OmniPod has been a Secretary/administrator for his diabetes and he is very happy with it. He initially had some issues with connectivity between the Dexcom and OmniPod, but now that he is placing them in the same line of site, the issue is resolved.    Care Team: Primary Care Provider: Jerrlyn Morel, NP ; Next Scheduled Visit: 07/05/24 Endocrinologist Dr. [name], MD; Next Scheduled Visit: 06/23/24  Medication Access/Adherence  Current Pharmacy:  [facility] [address], [address] [place] [address] Phone: [phone] Fax: [phone]   Patient reports affordability concerns with their medications: No  Patient reports access/transportation concerns to their pharmacy: No  Patient reports adherence concerns with their medications:  No     Diabetes:  Current medications: insulin  aspart (Novolog ) - use up to 70 units per day via insulin  pump, OmniPod  5 Medications tried in the past: prior basal/bolus insulin  pen regimen: Lantus  28-30 units at bedtime. Novolog  3-8 units with meals 2-3 times per day.   Diabetes was diagnosed at age 82  OmniPod settings per Endocrinology: Basal 12AM: 0.9 units/hr 7AM: 1.1 units/hr 10PM: 0.9 units/hr  BG Target: 110 mg/dL ISF: 6:96 ICR: 1:1 (per [facility] report 03/17/24) - per DM educator note on 02/11/24: I/C: 1 (patient to put in units of insulin  into the carb box of the bolus calculator)   Current glucose readings: No access to Clarity - unable to review today. Patient reports BG are stable.  Using Dexcom G7 - reports that he did have some sensor failures and is going to run out before his next refill. He was counseled to request replacement Dexcom via customer support. He can fill sensors again at [facility] pharmacy on 05/06/24.   Per [facility] report uploaded by endo on 03/18/24:  - Most recent insulin  TDD: 41.3 - Average daily carbs entered: 15.3 - Average total daily insulin : 41 units, basal 54%, bolus 46% - Automated mode 40%, manual mode 60% (patient was having Dexcom connectivity issues)   Date of Download: 03/18/24 % Time CGM is active: 24.5% Average Glucose: 175 mg/dL Glucose Management Indicator: N/A  Glucose Variability: 38.4 (goal <36%) Time in Goal:  - Time in range 70-180: 59% - Time above range: 39% - Time below range: 2%  Patient reports hypoglycemic s/sx including dizziness, shakiness, sweating which he feels has been associated with miscounting carbohydrates. Patient denies hyperglycemic symptoms including polyuria, polydipsia, polyphagia, nocturia, neuropathy, blurred vision.  Current medication  access support: Medicaid insurance  Hypertension:  Current medications: carvedilol  25 mg BID Medications previously tried: lisinopril  - stopped during hospitalization 2/2 AKI  Patient has a validated, automated, upper arm home BP cuff Current blood pressure readings readings: not  checking  Patient denies hypotensive s/sx including dizziness, lightheadedness.  Patient denies hypertensive symptoms including headache, chest pain, shortness of breath  Hyperlipidemia/ASCVD Risk Reduction  Current lipid lowering medications: none Medications tried in the past: none  No known cardiac family hx  Clinical ASCVD: No  The ASCVD Risk score (Arnett DK, et al., 2019) failed to calculate for the following reasons:   The 2019 ASCVD risk score is only valid for ages 75 to 3    Objective:  Lab Results  Component Value Date   HGBA1C 7.4 (A) 04/02/2024   HGBA1C 6.5 (A) 02/02/2024   HGBA1C 5.5 10/16/2023    Lab Results  Component Value Date   CREATININE 1.36 (H) 03/17/2024   BUN 22 03/17/2024   NA 140 03/17/2024   K 4.6 03/17/2024   CL 105 03/17/2024   CO2 29 03/17/2024    Lab Results  Component Value Date   CHOL 190 10/12/2021   HDL 58 10/12/2021   LDLCALC 121 (H) 10/12/2021   TRIG 57 10/12/2021   CHOLHDL 3.3 10/12/2021    Medications Reviewed Today     Reviewed by Adra Alanis, RPH (Pharmacist) on 04/15/24 at [phone]  Med List Status: <None>   Medication Order Taking? Sig Documenting Provider Last Dose Status Informant  acetaminophen  (TYLENOL ) 325 MG tablet [id number]  Take 2 tablets (650 mg total) by mouth every 6 (six) hours as needed for mild pain (or Fever >/= 101). Edison Gore, MD  Active Self           Med Note Alva Jewels, [name]   Thu Sep 11, 2023  3:44 PM) For headaches  Blood Pressure Monitoring (BLOOD PRESSURE CUFF) MISC [id number] Yes Use to monitor blood pressure at least 3 times per week. Jerrlyn Morel, NP  Active      Discontinued 04/15/24 0915 (Duplicate)   carvedilol  (COREG ) 25 MG tablet [id number]  Take 1 tablet (25 mg total) by mouth 2 (two) times daily with a meal. Jerrlyn Morel, NP  Active   Continuous Glucose Sensor (DEXCOM G7 SENSOR) MISC [id number]  Use as directed for continuous blood sugar monitoring. Jerrlyn Morel, NP  Active      Discontinued 04/15/24 0915 (Change in therapy)   Glucagon  (BAQSIMI  ONE PACK) 3 MG/DOSE POWD [id number]  Place 1 Device into the nose as needed (Low blood sugar with impaired consciousness). Motwani, Komal, MD  Active   insulin  aspart (NOVOLOG ) 100 UNIT/ML injection [id number] Yes Use up to 70 units/day via insulin  pump. Thapa, [place], MD  Active   Insulin  Disposable Pump (OMNIPOD 5 DEXG7G6 INTRO GEN 5) KIT [id number]  1 each by Does not apply route as needed. Thapa, [place], MD  Active   Insulin  Disposable Pump (OMNIPOD 5 DEXG7G6 PODS GEN 5) MISC [id number] Yes Apply route every 3 (three) days. Thapa, [place], MD  Active   insulin  glargine (LANTUS ) 100 UNIT/ML injection [id number]  Inject 0.28 mLs (28 Units total) into the skin at bedtime. Jerrlyn Morel, NP  Active   Magnesium  Chloride 64 MG TABS [id number]  Take 1 tablet (535 mg total) by mouth once daily for 5 days Each tablet contains 64 mg magnesium  = 535 mg magnesium  chloride   Active   ondansetron  (ZOFRAN -ODT) 4 MG disintegrating tablet  [id number]  Take 1 tablet (4 mg total) by mouth every 8 (eight) hours as needed for nausea or vomiting. Mason Sole, Pratik D, DO  Consider Medication Status and Discontinue (Completed Course) Self  pantoprazole  (PROTONIX ) 40 MG tablet [id number] Yes Take 1 tablet (40 mg total) by mouth daily. Jerrlyn Morel, NP  Active   polyethylene glycol powder (GLYCOLAX /MIRALAX ) 17 GM/SCOOP powder [id number]  Mix 1capful (17 g) with 4-8 ounces with water/juice by mouth 2 (two) times daily as needed. Jerrlyn Morel, NP  Active Self            Assessment/Plan:   Diabetes: - Currently uncontrolled with last A1C of 7.4% above goal < 7%, and slightly worsened from 6.5% in the setting of switching to OmniPod insulin  pump (managed by endo). Expect control will improve with continued use and comfort with the insulin  pump. Encouraged patient to monitor OmniPod app to ensure he is in automated mode as much as possible - reviewed reasons  that he may be kicked out of automated mode (max basal for too long - possible bad pump site, loss of connectivity with CGM). Did not discuss with patient, but noted after visit that ICR is set to 1:1, and DM educator note stated that patient should put units of insulin  in carb box in OmniPod app. Unclear why he would not be using automatic bolus calculator with a set ICR. Will reach out to the DM educator and Dr. Aretha Kubas to clarify. Would anticipated ICR of 1:12 with current TDD of ~41 units/day. - Reviewed long term cardiovascular and renal outcomes of uncontrolled blood sugar - Reviewed goal A1c, goal fasting, and goal 2 hour post prandial glucose - Recommend to continue insulin  aspart (Novolog ) up to 70 units per day via OmniPod 5 - Will leave backup Rx for Lantus  28 units daily on medication list - Patient denies personal or family history of multiple endocrine neoplasia type 2, medullary thyroid  cancer; personal history of pancreatitis or gallbladder disease. - Recommend to check glucose continously with Dexcom G7 CGM. Provided resource for requesting replacement sensor.   Hypertension: - Currently uncontrolled with last BP above goal < 130/80 mmHg. Patient is due for updated UACR. If elevated, could consider cautiously reintroducing ACEi or ARB, as lisinopril  was d/c while he had an AKI during a previous hospitalization (in the setting of dehydration with DKA). Likely appropriate to resume at a low dose. - Reviewed long term cardiovascular and renal outcomes of uncontrolled blood pressure - Reviewed appropriate blood pressure monitoring technique and reviewed goal blood pressure. Will place orders for replacement BP cuff. Recommended to check home blood pressure and heart rate once daily and keep log to review at next visit.  - Recommend to continue carvedilol  25 mg BID  - Obtain UACR at next PCP appt.    Hyperlipidemia/ASCVD Risk Reduction: - Currently uncontrolled with last LDL-C of 120  mg/dL above goal < 010 mg/dL with U7OZ. Due for repeat lipid panel. Consider moderate intensity statin at follow-up. Did not discuss today.  - Reviewed long term complications of uncontrolled cholesterol    Follow Up Plan: Pharmacist telephone 06/14/24, Endo 06/23/24, PCP 07/05/24  Arthea Larsson, PharmD PGY1 Pharmacy Resident

## 2024-04-15 ENCOUNTER — Other Ambulatory Visit: Payer: Self-pay

## 2024-04-15 ENCOUNTER — Other Ambulatory Visit (HOSPITAL_COMMUNITY): Payer: Self-pay

## 2024-04-15 MED ORDER — CARVEDILOL 25 MG PO TABS
25.0000 mg | ORAL_TABLET | Freq: Two times a day (BID) | ORAL | 3 refills | Status: DC
  Filled 2024-04-15 – 2024-05-03 (×2): qty 60, 30d supply, fill #0
  Filled 2024-06-17: qty 60, 30d supply, fill #1
  Filled 2024-07-15: qty 60, 30d supply, fill #2
  Filled 2024-08-25 (×2): qty 60, 30d supply, fill #3

## 2024-04-15 MED ORDER — DEXCOM G7 SENSOR MISC
1.0000 | 3 refills | Status: DC
  Filled 2024-04-15: qty 9, 90d supply, fill #0
  Filled 2024-04-19 (×2): qty 1, 10d supply, fill #0

## 2024-04-15 MED ORDER — INSULIN GLARGINE 100 UNIT/ML ~~LOC~~ SOLN: 28.0000 [IU] | Freq: Every day | SUBCUTANEOUS | Status: DC

## 2024-04-15 MED ORDER — INSULIN GLARGINE 100 UNIT/ML ~~LOC~~ SOLN
28.0000 [IU] | Freq: Every day | SUBCUTANEOUS | 11 refills | Status: AC
  Filled 2024-04-15: qty 10, 35d supply, fill #0
  Filled 2024-05-03: qty 10, 28d supply, fill #0
  Filled 2024-06-17: qty 10, 28d supply, fill #1
  Filled 2024-07-15: qty 10, 28d supply, fill #2
  Filled 2024-08-25 (×2): qty 10, 28d supply, fill #3
  Filled 2024-09-20: qty 10, 28d supply, fill #4
  Filled 2024-10-19: qty 10, 28d supply, fill #5
  Filled 2024-11-24: qty 10, 28d supply, fill #6

## 2024-04-15 MED ORDER — BLOOD PRESSURE CUFF MISC: 0 refills | Status: AC

## 2024-04-19 ENCOUNTER — Telehealth: Payer: Self-pay

## 2024-04-19 ENCOUNTER — Other Ambulatory Visit: Payer: Self-pay

## 2024-04-19 NOTE — Telephone Encounter (Signed)
 Copied from CRM [id number]. Topic: Clinical - Prescription Issue >> Apr 19, 2024 12:03 PM Zachary Vazquez wrote: Reason for CRM: Continuous Glucose Sensor Zachary [name] 4/12 sensors are not working. He called dexcom customer service and he's waiting for a replacements. However he will need it soon as possible . He would like for the provider to send out new prescription so he can buy out of pocket. Patient stated he doesn't mind and he's nervous he will go to DKA. He stated can the prescription be sent to both pharmacy but prefers [facility].  [facility] [address], [address] [place] [address] Phone: [phone] Fax: [phone]  [facility]  [address] [place], [place] [address]

## 2024-04-20 ENCOUNTER — Other Ambulatory Visit: Payer: Self-pay

## 2024-04-20 DIAGNOSIS — E1065 Type 1 diabetes mellitus with hyperglycemia: Secondary | ICD-10-CM

## 2024-04-20 MED ORDER — DEXCOM G7 SENSOR MISC
1.0000 | 3 refills | Status: AC
  Filled 2024-05-03 – 2024-05-06 (×2): qty 9, 90d supply, fill #0
  Filled 2024-07-15: qty 9, 90d supply, fill #1
  Filled 2024-10-19: qty 9, 90d supply, fill #2

## 2024-04-21 ENCOUNTER — Other Ambulatory Visit: Payer: Self-pay

## 2024-04-29 ENCOUNTER — Other Ambulatory Visit: Payer: Self-pay

## 2024-05-03 ENCOUNTER — Other Ambulatory Visit: Payer: Self-pay

## 2024-05-04 ENCOUNTER — Telehealth: Payer: Self-pay | Admitting: Nurse Practitioner

## 2024-05-04 NOTE — Telephone Encounter (Signed)
 Copied from CRM [id number]. Topic: General - Other >> May 04, 2024  3:48 PM Carlatta H wrote:  Reason for CRM: Patient is requesting a letter for airport due to dexcom device//Please call patient to advise//He stated he needs it by Thursday  Consider carrying a letter from your doctor and/or a manufacturer-provided medical device card explaining the CGM and necessary precautions.

## 2024-05-06 ENCOUNTER — Other Ambulatory Visit: Payer: Self-pay

## 2024-05-11 ENCOUNTER — Other Ambulatory Visit: Payer: Self-pay | Admitting: Nurse Practitioner

## 2024-05-11 ENCOUNTER — Other Ambulatory Visit: Payer: Self-pay

## 2024-05-11 MED ORDER — PANTOPRAZOLE SODIUM 40 MG PO TBEC
40.0000 mg | DELAYED_RELEASE_TABLET | Freq: Every day | ORAL | 1 refills | Status: DC
  Filled 2024-05-11 – 2024-06-17 (×2): qty 90, 90d supply, fill #0

## 2024-05-12 ENCOUNTER — Other Ambulatory Visit (HOSPITAL_BASED_OUTPATIENT_CLINIC_OR_DEPARTMENT_OTHER): Payer: Self-pay

## 2024-05-26 ENCOUNTER — Other Ambulatory Visit: Payer: Self-pay

## 2024-05-26 ENCOUNTER — Emergency Department (HOSPITAL_COMMUNITY)
Admission: EM | Admit: 2024-05-26 | Discharge: 2024-05-27 | Disposition: A | Discharge status: Home / Self Care | Attending: Student | Admitting: Student

## 2024-05-26 ENCOUNTER — Encounter (HOSPITAL_COMMUNITY): Payer: Self-pay | Admitting: Emergency Medicine

## 2024-05-26 DIAGNOSIS — R112 Nausea with vomiting, unspecified: Secondary | ICD-10-CM | POA: Insufficient documentation

## 2024-05-26 DIAGNOSIS — E109 Type 1 diabetes mellitus without complications: Secondary | ICD-10-CM | POA: Insufficient documentation

## 2024-05-26 DIAGNOSIS — D72829 Elevated white blood cell count, unspecified: Secondary | ICD-10-CM | POA: Diagnosis not present

## 2024-05-26 DIAGNOSIS — Z794 Long term (current) use of insulin: Secondary | ICD-10-CM | POA: Diagnosis not present

## 2024-05-26 LAB — CBC WITH DIFFERENTIAL/PLATELET
Abs Immature Granulocytes: 0.05 K/uL (ref 0.00–0.07)
Basophils Absolute: 0.1 K/uL (ref 0.0–0.1)
Basophils Relative: 1 %
Eosinophils Absolute: 0.2 K/uL (ref 0.0–0.5)
Eosinophils Relative: 1 %
HCT: 42.9 % (ref 39.0–52.0)
Hemoglobin: 14.5 g/dL (ref 13.0–17.0)
Immature Granulocytes: 0 %
Lymphocytes Relative: 17 %
Lymphs Abs: 1.9 K/uL (ref 0.7–4.0)
MCH: 31.7 pg (ref 26.0–34.0)
MCHC: 33.8 g/dL (ref 30.0–36.0)
MCV: 93.7 fL (ref 80.0–100.0)
Monocytes Absolute: 0.4 K/uL (ref 0.1–1.0)
Monocytes Relative: 3 %
Neutro Abs: 8.6 K/uL — ABNORMAL HIGH (ref 1.7–7.7)
Neutrophils Relative %: 78 %
Platelets: 275 K/uL (ref 150–400)
RBC: 4.58 MIL/uL (ref 4.22–5.81)
RDW: 12.7 % (ref 11.5–15.5)
WBC: 11.1 K/uL — ABNORMAL HIGH (ref 4.0–10.5)
nRBC: 0 % (ref 0.0–0.2)

## 2024-05-26 LAB — URINALYSIS, W/ REFLEX TO CULTURE (INFECTION SUSPECTED)
Bilirubin Urine: NEGATIVE
Glucose, UA: 150 mg/dL — AB
Hgb urine dipstick: NEGATIVE
Ketones, ur: 5 mg/dL — AB
Leukocytes,Ua: NEGATIVE
Nitrite: NEGATIVE
Protein, ur: 30 mg/dL — AB
Specific Gravity, Urine: 1.012 (ref 1.005–1.030)
pH: 6 (ref 5.0–8.0)

## 2024-05-26 LAB — I-STAT VENOUS BLOOD GAS, ED
Acid-Base Excess: 4 mmol/L — ABNORMAL HIGH (ref 0.0–2.0)
Bicarbonate: 26.5 mmol/L (ref 20.0–28.0)
Calcium, Ion: 1.06 mmol/L — ABNORMAL LOW (ref 1.15–1.40)
HCT: 44 % (ref 39.0–52.0)
Hemoglobin: 15 g/dL (ref 13.0–17.0)
O2 Saturation: 98 %
Potassium: 5.1 mmol/L (ref 3.5–5.1)
Sodium: 131 mmol/L — ABNORMAL LOW (ref 135–145)
TCO2: 27 mmol/L (ref 22–32)
pCO2, Ven: 32.3 mmHg — ABNORMAL LOW (ref 44–60)
pH, Ven: 7.522 — ABNORMAL HIGH (ref 7.25–7.43)
pO2, Ven: 85 mmHg — ABNORMAL HIGH (ref 32–45)

## 2024-05-26 LAB — COMPREHENSIVE METABOLIC PANEL WITH GFR
ALT: 20 U/L (ref 0–44)
AST: 20 U/L (ref 15–41)
Albumin: 4 g/dL (ref 3.5–5.0)
Alkaline Phosphatase: 64 U/L (ref 38–126)
Anion gap: 11 (ref 5–15)
BUN: 18 mg/dL (ref 6–20)
CO2: 25 mmol/L (ref 22–32)
Calcium: 9.5 mg/dL (ref 8.9–10.3)
Chloride: 97 mmol/L — ABNORMAL LOW (ref 98–111)
Creatinine, Ser: 1.62 mg/dL — ABNORMAL HIGH (ref 0.61–1.24)
GFR, Estimated: 56 mL/min — ABNORMAL LOW (ref >60)
Glucose, Bld: 236 mg/dL — ABNORMAL HIGH (ref 70–99)
Potassium: 5 mmol/L (ref 3.5–5.1)
Sodium: 133 mmol/L — ABNORMAL LOW (ref 135–145)
Total Bilirubin: 1.3 mg/dL — ABNORMAL HIGH (ref 0.0–1.2)
Total Protein: 6.7 g/dL (ref 6.5–8.1)

## 2024-05-26 LAB — I-STAT CHEM 8, ED
BUN: 22 mg/dL — ABNORMAL HIGH (ref 6–20)
Calcium, Ion: 1.07 mmol/L — ABNORMAL LOW (ref 1.15–1.40)
Chloride: 99 mmol/L (ref 98–111)
Creatinine, Ser: 1.6 mg/dL — ABNORMAL HIGH (ref 0.61–1.24)
Glucose, Bld: 240 mg/dL — ABNORMAL HIGH (ref 70–99)
HCT: 45 % (ref 39.0–52.0)
Hemoglobin: 15.3 g/dL (ref 13.0–17.0)
Potassium: 5 mmol/L (ref 3.5–5.1)
Sodium: 132 mmol/L — ABNORMAL LOW (ref 135–145)
TCO2: 25 mmol/L (ref 22–32)

## 2024-05-26 LAB — CBG MONITORING, ED: Glucose-Capillary: 216 mg/dL — ABNORMAL HIGH (ref 70–99)

## 2024-05-26 LAB — BETA-HYDROXYBUTYRIC ACID: Beta-Hydroxybutyric Acid: 0.74 mmol/L — ABNORMAL HIGH (ref 0.05–0.27)

## 2024-05-26 MED ORDER — SODIUM CHLORIDE 0.9 % IV BOLUS
1000.0000 mL | Freq: Once | INTRAVENOUS | Status: AC
  Administered 2024-05-26: 1000 mL via INTRAVENOUS

## 2024-05-26 MED ORDER — METOCLOPRAMIDE HCL 5 MG/ML IJ SOLN
10.0000 mg | INTRAMUSCULAR | Status: AC
  Administered 2024-05-26: 10 mg via INTRAVENOUS
  Filled 2024-05-26: qty 2

## 2024-05-26 NOTE — ED Triage Notes (Signed)
 BIB EMS Hx of diabetes. Was out of town and went into DKA 1 week ago and was hospitalized. PT stated n/v today and feels like he is in DKA again. Pt vomiting during triage  Left ac 20g Cbg 233 89m zofran   500 ns Diabetic pump on left arm.

## 2024-05-26 NOTE — ED Provider Notes (Signed)
 [place] EMERGENC[facility] DEPARTMENT AT [facility] Provider Note   CSN: [id number] Arrival date & time: 05/26/24  2225     Patient presents with: No chief complaint on file.   Zachary Vazquez is a 38 y.o. male.  {Add pertinent medical, surgical, social history, OB history to YEP:67052} The history is provided by the patient and medical records.   38 year old male with history of type 1 diabetes, chronic vomiting, presenting to the ED with nausea and vomiting.  States he has not really been able to hold anything down all day today.  He denies any diarrhea.  No fever or chills.  No significant abdominal pain.  States blood sugars have been variable, was around 230 with EMS.  He did receive 500 cc bolus and 4 mg Zofran  with EMS.  He is still quite nauseated on arrival  Prior to Admission medications   Medication Sig Start Date End Date Taking? Authorizing Provider  acetaminophen  (TYLENOL ) 325 MG tablet Take 2 tablets (650 mg total) by mouth every 6 (six) hours as needed for mild pain (or Fever >/= 101). 04/08/23   Romelle Booty, MD  Blood Pressure Monitoring (BLOOD PRESSURE CUFF) MISC Use to monitor blood pressure at least 3 times per week. 04/15/24   Oley Bascom RAMAN, NP  carvedilol  (COREG ) 25 MG tablet Take 1 tablet (25 mg total) by mouth 2 (two) times daily with a meal. 04/15/24   Oley Bascom RAMAN, NP  Continuous Glucose Sensor (DEXCOM G7 SENSOR) MISC Use as directed for continuous blood sugar monitoring. Change sensor every 10 days. 04/20/24   Nichols, Tonya S, NP  Glucagon  (BAQSIMI  ONE PACK) 3 MG/DOSE POWD Place 1 Device into the nose as needed (Low blood sugar with impaired consciousness). 10/13/23   Motwani, Komal, MD  insulin  aspart (NOVOLOG ) 100 UNIT/ML injection Use up to 70 units/day via insulin  pump. 03/17/24   Thapa, Iraq, MD  Insulin  Disposable Pump (OMNIPOD 5 DEXG7G6 PODS GEN 5) MISC Apply route every 3 (three) days. 02/02/24   Thapa, [place], MD  insulin  glargine (LANTUS )  100 UNIT/ML injection Inject 0.28 mLs (28 Units total) into the skin at bedtime. This is a backup prescription in case of insulin  pump failure. 04/15/24   Oley Bascom RAMAN, NP  Magnesium  Chloride 64 MG TABS Take 1 tablet (535 mg total) by mouth once daily for 5 days Each tablet contains 64 mg magnesium  = 535 mg magnesium  chloride 10/24/23     pantoprazole  (PROTONIX ) 40 MG tablet Take 1 tablet (40 mg total) by mouth daily. 05/11/24   Oley Bascom RAMAN, NP  polyethylene glycol powder (GLYCOLAX /MIRALAX ) 17 GM/SCOOP powder Mix 1capful (17 g) with 4-8 ounces with water/juice by mouth 2 (two) times daily as needed. 04/14/23   Oley Bascom RAMAN, NP    Allergies: Clindamycin/lincomycin and Bactrim  [sulfamethoxazole -trimethoprim ]    Review of Systems  Gastrointestinal:  Positive for nausea and vomiting.  All other systems reviewed and are negative.   Updated Vital Signs There were no vitals taken for this visit.  Physical Exam Vitals and nursing note reviewed.  Constitutional:      Appearance: He is well-developed.     Comments: Pale in appearance  HENT:     Head: Normocephalic and atraumatic.     Mouth/Throat:     Mouth: Mucous membranes are dry.     Comments: Dry mucous membranes Eyes:     Conjunctiva/sclera: Conjunctivae normal.     Pupils: Pupils are equal, round, and reactive to light.  Cardiovascular:  Rate and Rhythm: Normal rate and regular rhythm.     Heart sounds: Normal heart sounds.  Pulmonary:     Effort: Pulmonary effort is normal.     Breath sounds: Normal breath sounds.  Abdominal:     General: Bowel sounds are normal.     Palpations: Abdomen is soft.     Tenderness: There is no abdominal tenderness.     Comments: Soft, non-tender  Musculoskeletal:        General: Normal range of motion.     Cervical back: Normal range of motion.  Skin:    General: Skin is warm and dry.  Neurological:     Mental Status: He is alert and oriented to person, place, and time.      (all labs ordered are listed, but only abnormal results are displayed) Labs Reviewed  CBC WITH DIFFERENTIAL/PLATELET - Abnormal; Notable for the following components:      Result Value   WBC 11.1 (*)    Neutro Abs 8.6 (*)    All other components within normal limits  COMPREHENSIVE METABOLIC PANEL WITH GFR - Abnormal; Notable for the following components:   Sodium 133 (*)    Chloride 97 (*)    Glucose, Bld 236 (*)    Creatinine, Ser 1.62 (*)    Total Bilirubin 1.3 (*)    GFR, Estimated 56 (*)    All other components within normal limits  URINALYSIS, W/ REFLEX TO CULTURE (INFECTION SUSPECTED) - Abnormal; Notable for the following components:   Glucose, UA 150 (*)    Ketones, ur 5 (*)    Protein, ur 30 (*)    Bacteria, UA RARE (*)    All other components within normal limits  BETA-HYDROXYBUTYRIC ACID - Abnormal; Notable for the following components:   Beta-Hydroxybutyric Acid 0.74 (*)    All other components within normal limits  I-STAT VENOUS BLOOD GAS, ED - Abnormal; Notable for the following components:   pH, Ven 7.522 (*)    pCO2, Ven 32.3 (*)    pO2, Ven 85 (*)    Acid-Base Excess 4.0 (*)    Sodium 131 (*)    Calcium , Ion 1.06 (*)    All other components within normal limits  I-STAT CHEM 8, ED - Abnormal; Notable for the following components:   Sodium 132 (*)    BUN 22 (*)    Creatinine, Ser 1.60 (*)    Glucose, Bld 240 (*)    Calcium , Ion 1.07 (*)    All other components within normal limits  CBG MONITORING, ED - Abnormal; Notable for the following components:   Glucose-Capillary 216 (*)    All other components within normal limits    EKG: None  Radiology: No results found.  {Document cardiac monitor, telemetry assessment procedure when appropriate:32947} Procedures   Medications Ordered in the ED  sodium chloride  0.9 % bolus 1,000 mL (has no administration in time range)  metoCLOPramide  (REGLAN ) injection 10 mg (has no administration in time range)       {Click here for ABCD2, HEART and other calculators REFRESH Note before signing:1}                              Medical Decision Making Amount and/or Complexity of Data Reviewed Labs: ordered. ECG/medicine tests: ordered.  Risk Prescription drug management.   38 year old male presenting to the ED with nausea and vomiting.  Reports recent hospitalization due to DKA.  Had  return of symptoms today.  CBG 233 with EMS.  Had 500cc fluid and zofran  with EMS.  He is afebrile and nontoxic in appearance here.  He is hemodynamically stable on room air.  Will check labs.  Plan for IV fluids, Reglan   Labs as above--minimal leukocytosis at 11.1.  Creatinine is 1.6, similar to prior.  Glucose 236 but maintains normal bicarb and anion gap.  pH is normal.  UA with minimal ketones but I suspect this is due to dehydration.  His labs are not consistent with DKA today.  Patient has not had further emesis after additional meds in the ER.  We have discussed his results.  He does have history of chronic vomiting so may be contributing.  Trialed ice chips but vomited.  Final diagnoses:  None    ED Discharge Orders     None

## 2024-05-27 ENCOUNTER — Other Ambulatory Visit: Payer: Self-pay

## 2024-05-27 MED ORDER — ONDANSETRON 4 MG PO TBDP
4.0000 mg | ORAL_TABLET | Freq: Three times a day (TID) | ORAL | 0 refills | Status: DC | PRN
Start: 2024-05-27 — End: 2024-06-07
  Filled 2024-05-27: qty 10, 4d supply, fill #0

## 2024-05-27 MED ORDER — PROMETHAZINE HCL 25 MG RE SUPP
25.0000 mg | Freq: Four times a day (QID) | RECTAL | 0 refills | Status: DC | PRN
Start: 2024-05-27 — End: 2024-06-29
  Filled 2024-05-27: qty 12, 3d supply, fill #0

## 2024-05-27 MED ORDER — DROPERIDOL 2.5 MG/ML IJ SOLN
2.5000 mg | Freq: Once | INTRAMUSCULAR | Status: AC
  Administered 2024-05-27: 2.5 mg via INTRAVENOUS
  Filled 2024-05-27: qty 2

## 2024-05-27 NOTE — ED Notes (Signed)
 Patient is resting comfortably.

## 2024-05-27 NOTE — Discharge Instructions (Signed)
 Labs today were not consistent with DKA. Take the prescribed medication as directed-- zofran  first line, phenergan  if still not tolerating. Follow-up with your primary care doctor. Return to the ED for new or worsening symptoms.

## 2024-05-28 ENCOUNTER — Other Ambulatory Visit: Payer: Self-pay

## 2024-06-01 ENCOUNTER — Telehealth: Payer: Self-pay

## 2024-06-01 NOTE — Transitions of Care (Post Inpatient/ED Visit) (Signed)
 06/01/2024  Name: Zachary Vazquez MRN: [id number] DOB: Dec 15, 1985  Today's TOC FU Call Status:   Patient's Name and Date of Birth confirmed.  Transition Care Management Follow-up Telephone Call Date of Discharge: 05/27/24 Discharge Facility: [facility]) Type of Discharge: Emergency Department How have you been since you were released from the hospital?: Same Any questions or concerns?: No  Items Reviewed: Did you receive and understand the discharge instructions provided?: Yes Medications obtained,verified, and reconciled?: Yes (Medications Reviewed) Any new allergies since your discharge?: No Dietary orders reviewed?: NA Do you have support at home?: Yes  Medications Reviewed Today: Medications Reviewed Today     Reviewed by Starlene Charlynn BIRCH, CMA (Certified Medical Assistant) on 06/01/24 at 1103  Med List Status: <None>   Medication Order Taking? Sig Documenting Provider Last Dose Status Informant  acetaminophen  (TYLENOL ) 325 MG tablet [id number] Yes Take 2 tablets (650 mg total) by mouth every 6 (six) hours as needed for mild pain (or Fever >/= 101). Romelle Booty, MD  Active Self           Med Note MARZETTA, [name]   Thu Sep 11, 2023  3:44 PM) For headaches  Blood Pressure Monitoring (BLOOD PRESSURE CUFF) MISC [id number] Yes Use to monitor blood pressure at least 3 times per week. Oley Bascom RAMAN, NP  Active   carvedilol  (COREG ) 25 MG tablet [id number] Yes Take 1 tablet (25 mg total) by mouth 2 (two) times daily with a meal. Oley Bascom RAMAN, NP  Active   Continuous Glucose Sensor (DEXCOM G7 SENSOR) MISC [id number] Yes Use as directed for continuous blood sugar monitoring. Change sensor every 10 days. Oley Bascom RAMAN, NP  Active   Glucagon  (BAQSIMI  ONE PACK) 3 MG/DOSE POWD [id number] Yes Place 1 Device into the nose as needed (Low blood sugar with impaired consciousness). Motwani, Komal, MD  Active   insulin  aspart (NOVOLOG ) 100 UNIT/ML injection [id number] Yes Use up to 70  units/day via insulin  pump. Thapa, [place], MD  Active   Insulin  Disposable Pump (OMNIPOD 5 DEXG7G6 PODS GEN 5) MISC [id number] Yes Apply route every 3 (three) days. Thapa, [place], MD  Active   insulin  glargine (LANTUS ) 100 UNIT/ML injection [id number] Yes Inject 0.28 mLs (28 Units total) into the skin at bedtime. This is a backup prescription in case of insulin  pump failure. Oley Bascom RAMAN, NP  Active   Magnesium  Chloride 64 MG TABS [id number] Yes Take 1 tablet (535 mg total) by mouth once daily for 5 days Each tablet contains 64 mg magnesium  = 535 mg magnesium  chloride   Active   ondansetron  (ZOFRAN -ODT) 4 MG disintegrating tablet [id number] Yes Take 1 tablet (4 mg total) by mouth every 8 (eight) hours as needed. Jarold Olam HERO, PA-C  Active   pantoprazole  (PROTONIX ) 40 MG tablet [id number] Yes Take 1 tablet (40 mg total) by mouth daily. Oley Bascom RAMAN, NP  Active   polyethylene glycol powder (GLYCOLAX /MIRALAX ) 17 GM/SCOOP powder [id number] Yes Mix 1capful (17 g) with 4-8 ounces with water/juice by mouth 2 (two) times daily as needed. Oley Bascom RAMAN, NP  Active Self  promethazine  (PHENERGAN ) 25 MG suppository [id number] Yes Place 1 suppository (25 mg total) rectally every 6 (six) hours as needed for nausea or vomiting. Jarold Olam HERO, PA-C  Active             Home Care and Equipment/Supplies: Were Home Health Services Ordered?: NA Any new equipment or medical supplies ordered?: NA  Functional Questionnaire: Do you need assistance  with bathing/showering or dressing?: No Do you need assistance with meal preparation?: No Do you need assistance with eating?: No Do you have difficulty maintaining continence: No Do you need assistance with getting out of bed/getting out of a chair/moving?: No Do you have difficulty managing or taking your medications?: No  Follow up appointments reviewed: PCP Follow-up appointment confirmed?: Yes Date of PCP follow-up appointment?: 06/07/24 Specialist  Hospital Follow-up appointment confirmed?: NA Do you need transportation to your follow-up appointment?: No Do you understand care options if your condition(s) worsen?: Yes-patient verbalized understanding  SDOH Interventions Today    Flowsheet Row Most Recent Value  SDOH Interventions   Food Insecurity Interventions Intervention Not Indicated  Housing Interventions Intervention Not Indicated  Transportation Interventions Intervention Not Indicated    SIGNATURE Brenan Mod[place], RMA

## 2024-06-07 ENCOUNTER — Inpatient Hospital Stay: Payer: Self-pay | Admitting: Nurse Practitioner

## 2024-06-07 ENCOUNTER — Ambulatory Visit: Payer: Self-pay | Admitting: Nurse Practitioner

## 2024-06-07 ENCOUNTER — Other Ambulatory Visit: Payer: Self-pay

## 2024-06-07 ENCOUNTER — Encounter: Payer: Self-pay | Admitting: Nurse Practitioner

## 2024-06-07 DIAGNOSIS — E1065 Type 1 diabetes mellitus with hyperglycemia: Secondary | ICD-10-CM

## 2024-06-07 MED ORDER — ONDANSETRON 4 MG PO TBDP
4.0000 mg | ORAL_TABLET | Freq: Three times a day (TID) | ORAL | 0 refills | Status: DC | PRN
  Filled 2024-06-07 (×2): qty 5, 2d supply, fill #0

## 2024-06-07 NOTE — Progress Notes (Signed)
 Subjective   Patient ID: Zachary Vazquez, male    DOB: 03-Oct-1986, 38 y.o.   MRN: [id number]  Chief Complaint  Patient presents with   Hospitalization Follow-up    Referring provider: Oley Bascom RAMAN, NP  Siegfried Vieth is a 38 y.o. male with Past Medical History: No date: Cannabis hyperemesis syndrome concurrent with and due to  cannabis abuse (HCC) 10/2019: History of delayed wound healing No date: Tetrahydrocannabinol (THC) use disorder, severe, dependence  (HCC) No date: Type 1 diabetes mellitus (HCC) 09/2020: Vitamin D  deficiency   HPI  Patient presents today for hospital follow-up.  He does need a refill on Zofran .  He was hospitalized for chronic vomiting.  He does have an upcoming appointment with GI to discuss this.  Overall stable in office today.  We will order repeat labs today. Denies f/c/s, n/v/d, hemoptysis, PND, leg swelling Denies chest pain or edema      Allergies  Allergen Reactions   Clindamycin/Lincomycin Anaphylaxis   Bactrim  [Sulfamethoxazole -Trimethoprim ] Swelling and Other (See Comments)    Eyes swell and bumps appear on/near the eyes     Immunization History  Administered Date(s) Administered   Tdap 12/15/2019    Tobacco History: Social History   Tobacco Use  Smoking Status Every Day   Current packs/day: 1.00   Average packs/day: 1 pack/day for 15.0 years (15.0 ttl pk-yrs)   Types: Cigarettes  Smokeless Tobacco Never   Ready to quit: Not Answered Counseling given: Yes   Outpatient Encounter Medications as of 06/07/2024  Medication Sig   acetaminophen  (TYLENOL ) 325 MG tablet Take 2 tablets (650 mg total) by mouth every 6 (six) hours as needed for mild pain (or Fever >/= 101).   Blood Pressure Monitoring (BLOOD PRESSURE CUFF) MISC Use to monitor blood pressure at least 3 times per week.   carvedilol  (COREG ) 25 MG tablet Take 1 tablet (25 mg total) by mouth 2 (two) times daily with a meal.   Continuous Glucose Sensor  (DEXCOM G7 SENSOR) MISC Use as directed for continuous blood sugar monitoring. Change sensor every 10 days.   Glucagon  (BAQSIMI  ONE PACK) 3 MG/DOSE POWD Place 1 Device into the nose as needed (Low blood sugar with impaired consciousness).   insulin  aspart (NOVOLOG ) 100 UNIT/ML injection Use up to 70 units/day via insulin  pump.   Insulin  Disposable Pump (OMNIPOD 5 DEXG7G6 PODS GEN 5) MISC Apply route every 3 (three) days.   insulin  glargine (LANTUS ) 100 UNIT/ML injection Inject 0.28 mLs (28 Units total) into the skin at bedtime. This is a backup prescription in case of insulin  pump failure.   Magnesium  Chloride 64 MG TABS Take 1 tablet (535 mg total) by mouth once daily for 5 days Each tablet contains 64 mg magnesium  = 535 mg magnesium  chloride   pantoprazole  (PROTONIX ) 40 MG tablet Take 1 tablet (40 mg total) by mouth daily.   polyethylene glycol powder (GLYCOLAX /MIRALAX ) 17 GM/SCOOP powder Mix 1capful (17 g) with 4-8 ounces with water/juice by mouth 2 (two) times daily as needed.   promethazine  (PHENERGAN ) 25 MG suppository Place 1 suppository (25 mg total) rectally every 6 (six) hours as needed for nausea or vomiting.   [DISCONTINUED] ondansetron  (ZOFRAN -ODT) 4 MG disintegrating tablet Take 1 tablet (4 mg total) by mouth every 8 (eight) hours as needed.   ondansetron  (ZOFRAN -ODT) 4 MG disintegrating tablet Take 1 tablet (4 mg total) by mouth every 8 (eight) hours as needed.   No facility-administered encounter medications on file as of 06/07/2024.  Review of Systems  Review of Systems  Constitutional: Negative.   HENT: Negative.    Cardiovascular: Negative.   Gastrointestinal: Negative.   Allergic/Immunologic: Negative.   Neurological: Negative.   Psychiatric/Behavioral: Negative.       Objective:   BP 128/77   Pulse 79   Temp 98.1 F (36.7 C) (Oral)   Wt 179 lb 12.8 oz (81.6 kg)   SpO2 99%   BMI 25.08 kg/m   Wt Readings from Last 5 Encounters:  06/07/24 179 lb 12.8 oz (81.6  kg)  05/26/24 184 lb 15.5 oz (83.9 kg)  04/02/24 185 lb (83.9 kg)  03/17/24 182 lb 12.8 oz (82.9 kg)  02/02/24 188 lb (85.3 kg)     Physical Exam Vitals and nursing note reviewed.  Constitutional:      General: He is not in acute distress.    Appearance: He is well-developed.  Cardiovascular:     Rate and Rhythm: Normal rate and regular rhythm.  Pulmonary:     Effort: Pulmonary effort is normal.     Breath sounds: Normal breath sounds.  Skin:    General: Skin is warm and dry.  Neurological:     Mental Status: He is alert and oriented to person, place, and time.       Assessment & Plan:   Uncontrolled type 1 diabetes mellitus with hyperglycemia (HCC) -     Microalbumin / creatinine urine ratio -     CBC -     Comprehensive metabolic panel with GFR  Other orders -     Ondansetron ; Take 1 tablet (4 mg total) by mouth every 8 (eight) hours as needed.  Dispense: 10 tablet; Refill: 0     Return if symptoms worsen or fail to improve.     Bascom GORMAN Borer, NP 06/07/2024

## 2024-06-08 ENCOUNTER — Ambulatory Visit: Payer: Self-pay | Admitting: Nurse Practitioner

## 2024-06-08 ENCOUNTER — Other Ambulatory Visit: Payer: Self-pay

## 2024-06-08 DIAGNOSIS — N289 Disorder of kidney and ureter, unspecified: Secondary | ICD-10-CM

## 2024-06-08 LAB — COMPREHENSIVE METABOLIC PANEL WITH GFR
ALT: 15 IU/L (ref 0–44)
AST: 15 IU/L (ref 0–40)
Albumin: 4.2 g/dL (ref 4.1–5.1)
Alkaline Phosphatase: 71 IU/L (ref 44–121)
BUN/Creatinine Ratio: 10 (ref 9–20)
BUN: 18 mg/dL (ref 6–20)
Bilirubin Total: 0.4 mg/dL (ref 0.0–1.2)
CO2: 21 mmol/L (ref 20–29)
Calcium: 9.7 mg/dL (ref 8.7–10.2)
Chloride: 98 mmol/L (ref 96–106)
Creatinine, Ser: 1.77 mg/dL — ABNORMAL HIGH (ref 0.76–1.27)
Globulin, Total: 2.8 g/dL (ref 1.5–4.5)
Glucose: 131 mg/dL — ABNORMAL HIGH (ref 70–99)
Potassium: 4.3 mmol/L (ref 3.5–5.2)
Sodium: 135 mmol/L (ref 134–144)
Total Protein: 7 g/dL (ref 6.0–8.5)
eGFR: 50 mL/min/1.73 — ABNORMAL LOW (ref >59)

## 2024-06-08 LAB — MICROALBUMIN / CREATININE URINE RATIO
Creatinine, Urine: 72.9 mg/dL
Microalb/Creat Ratio: 33 mg/g{creat} — ABNORMAL HIGH (ref 0–29)
Microalbumin, Urine: 24.2 ug/mL

## 2024-06-08 LAB — CBC
Hematocrit: 40.8 % (ref 37.5–51.0)
Hemoglobin: 13.1 g/dL (ref 13.0–17.7)
MCH: 30.8 pg (ref 26.6–33.0)
MCHC: 32.1 g/dL (ref 31.5–35.7)
MCV: 96 fL (ref 79–97)
Platelets: 247 x10E3/uL (ref 150–450)
RBC: 4.26 x10E6/uL (ref 4.14–5.80)
RDW: 12.8 % (ref 11.6–15.4)
WBC: 11 x10E3/uL — ABNORMAL HIGH (ref 3.4–10.8)

## 2024-06-11 ENCOUNTER — Encounter: Payer: Self-pay | Admitting: Nurse Practitioner

## 2024-06-11 ENCOUNTER — Other Ambulatory Visit: Payer: Self-pay

## 2024-06-11 MED ORDER — ONDANSETRON 4 MG PO TBDP
4.0000 mg | ORAL_TABLET | Freq: Three times a day (TID) | ORAL | 0 refills | Status: DC | PRN
  Filled 2024-06-11: qty 10, 4d supply, fill #0

## 2024-06-14 ENCOUNTER — Other Ambulatory Visit

## 2024-06-17 ENCOUNTER — Other Ambulatory Visit: Payer: Self-pay

## 2024-06-23 ENCOUNTER — Ambulatory Visit: Admitting: Endocrinology

## 2024-06-29 ENCOUNTER — Other Ambulatory Visit

## 2024-06-29 ENCOUNTER — Other Ambulatory Visit: Payer: Self-pay

## 2024-06-29 ENCOUNTER — Ambulatory Visit: Admitting: Gastroenterology

## 2024-06-29 ENCOUNTER — Encounter: Payer: Self-pay | Admitting: Gastroenterology

## 2024-06-29 VITALS — BP 120/88 | HR 71 | Ht 71.0 in | Wt 173.8 lb

## 2024-06-29 DIAGNOSIS — K5904 Chronic idiopathic constipation: Secondary | ICD-10-CM | POA: Diagnosis not present

## 2024-06-29 DIAGNOSIS — R112 Nausea with vomiting, unspecified: Secondary | ICD-10-CM | POA: Diagnosis not present

## 2024-06-29 DIAGNOSIS — E109 Type 1 diabetes mellitus without complications: Secondary | ICD-10-CM

## 2024-06-29 MED ORDER — ONDANSETRON 4 MG PO TBDP
4.0000 mg | ORAL_TABLET | Freq: Every day | ORAL | 0 refills | Status: AC | PRN
  Filled 2024-06-29: qty 30, 30d supply, fill #0

## 2024-06-29 MED ORDER — PRUCALOPRIDE SUCCINATE 2 MG PO TABS
2.0000 mg | ORAL_TABLET | Freq: Every day | ORAL | 1 refills | Status: DC
  Filled 2024-06-29: qty 90, 90d supply, fill #0

## 2024-06-29 NOTE — Progress Notes (Deleted)
 06/29/2024 Name: Zachary Vazquez MRN: [id number] DOB: 11/20/1985  No chief complaint on file.   Zachary Vazquez is a 38 y.o. year old male who presented for a telephone visit.   They were referred to the pharmacist by their PCP for assistance in managing diabetes. PMH includes HTN, chronic nausea/vomiting, T1DM, tobacco use disorder, marijuana use.  Subjective: Patient was most recently seen by PCP, [name], NP on 04/02/24. BP was 134/88, HR 78. Patient reported doing well. No medication changes were made. He has been following with Dr. Mercie for endocrinology, with last visit on 03/17/24. He was started on OmniPod in April 2025. Patient was last engaged by pharmacy via telephone on 04/14/24. At this time, patient reported doing well on his Omnipod, despite having some connectivity issues between the Dexcom and Omnipod. Issues resolved now that he is placing them in the same line of site. He did go to the ED on 05/26/24 for nausea and vomiting (chronic issue). His labs were not consistent with DKA. He was provided with prescriptions for promethazine  and ondansetron  PRN. He followed up with his PCP on 06/07/24. BMP stable, but Scr slightly worsened from previous. UACR improved from 559 mg/dL to 33 mg/dL. Patient has upcoming appt with GI to discuss chronic nausea/vomiting.   Today, ***   Reschedule with endo - missed 06/23/24 apt  Patient reports doing well today. His main concern is that he has been getting spam texts about his Medicaid ending. He has tried to contact the social services offices to see if that is accurate, but he has had difficulty contacting them and getting a clear answer. He reports that the OmniPod has been a Secretary/administrator for his diabetes and he is very happy with it. He initially had some issues with connectivity between the Dexcom and OmniPod, but now that he is placing them in the same line of site, the issue is resolved.    Care Team: Primary Care Provider:  Borer Bascom RAMAN, NP ; Next Scheduled Visit: 07/05/24 (cancel) - keep 09/09/24. Endocrinologist Dr. [name], MD; Next Scheduled Visit: 06/23/24 - needs to reschedule (no show)  Medication Access/Adherence  Current Pharmacy:  [facility] [address], [address] [place] [address] Phone: [phone] Fax: [phone]   Patient reports affordability concerns with their medications: No  Patient reports access/transportation concerns to their pharmacy: No  Patient reports adherence concerns with their medications:  No     Diabetes:  Current medications: insulin  aspart (Novolog ) - use up to 70 units per day via insulin  pump, OmniPod 5 Medications tried in the past: prior basal/bolus insulin  pen regimen: Lantus  28-30 units at bedtime. Novolog  3-8 units with meals 2-3 times per day.   Diabetes was diagnosed at age 39  OmniPod settings per Endocrinology: Basal 12AM: 0.9 units/hr 7AM: 1.1 units/hr 10PM: 0.9 units/hr  BG Target: 110 mg/dL ISF: 8:59 ICR: 1:1 (per [facility] report 03/17/24) - per DM educator note on 02/11/24: I/C: 1 (patient to put in units of insulin  into the carb box of the bolus calculator)   Current glucose readings: No access to Clarity - unable to review today. Patient reports BG are stable.  Using Dexcom G7 - reports that he did have some sensor failures and is going to run out before his next refill. He was counseled to request replacement Dexcom via customer support. He can fill sensors again at [facility] pharmacy on 05/06/24.   Per [facility] report uploaded  by endo on 03/18/24:  - Most recent insulin  TDD: 41.3 - Average daily carbs entered: 15.3 - Average total daily insulin : 41 units, basal 54%, bolus 46% - Automated mode 40%, manual mode 60% (patient was having Dexcom connectivity issues)   Date of Download: 03/18/24 % Time CGM is active: 24.5% Average Glucose: 175 mg/dL Glucose Management Indicator: N/A   Glucose Variability: 38.4 (goal <36%) Time in Goal:  - Time in range 70-180: 59% - Time above range: 39% - Time below range: 2%  Patient reports hypoglycemic s/sx including dizziness, shakiness, sweating which he feels has been associated with miscounting carbohydrates. Patient denies hyperglycemic symptoms including polyuria, polydipsia, polyphagia, nocturia, neuropathy, blurred vision.  Current medication access support: Medicaid insurance  Hypertension:  Current medications: carvedilol  25 mg BID Medications previously tried: lisinopril  - stopped during hospitalization 2/2 AKI  Patient has a validated, automated, upper arm home BP cuff Current blood pressure readings readings: not checking  Patient denies hypotensive s/sx including dizziness, lightheadedness.  Patient denies hypertensive symptoms including headache, chest pain, shortness of breath  Hyperlipidemia/ASCVD Risk Reduction  Current lipid lowering medications: none Medications tried in the past: none  No known cardiac family hx  Clinical ASCVD: No  The ASCVD Risk score (Arnett DK, et al., 2019) failed to calculate for the following reasons:   The 2019 ASCVD risk score is only valid for ages 9 to 23    Objective:  Lab Results  Component Value Date   HGBA1C 7.4 (A) 04/02/2024   HGBA1C 6.5 (A) 02/02/2024   HGBA1C 5.5 10/16/2023    Lab Results  Component Value Date   CREATININE 1.77 (H) 06/07/2024   BUN 18 06/07/2024   NA 135 06/07/2024   K 4.3 06/07/2024   CL 98 06/07/2024   CO2 21 06/07/2024    Lab Results  Component Value Date   CHOL 190 10/12/2021   HDL 58 10/12/2021   LDLCALC 121 (H) 10/12/2021   TRIG 57 10/12/2021   CHOLHDL 3.3 10/12/2021    Medications Reviewed Today   Medications were not reviewed in this encounter     Assessment/Plan:   Diabetes: - Currently uncontrolled with last A1C of 7.4% above goal < 7%, and slightly worsened from 6.5% in the setting of switching to OmniPod  insulin  pump (managed by endo). Expect control will improve with continued use and comfort with the insulin  pump. Encouraged patient to monitor OmniPod app to ensure he is in automated mode as much as possible - reviewed reasons that he may be kicked out of automated mode (max basal for too long - possible bad pump site, loss of connectivity with CGM). Did not discuss with patient, but noted after visit that ICR is set to 1:1, and DM educator note stated that patient should put units of insulin  in carb box in OmniPod app. Unclear why he would not be using automatic bolus calculator with a set ICR. Will reach out to the DM educator and Dr. Mercie to clarify. Would anticipated ICR of 1:12 with current TDD of ~41 units/day. - Reviewed long term cardiovascular and renal outcomes of uncontrolled blood sugar - Reviewed goal A1c, goal fasting, and goal 2 hour post prandial glucose - Recommend to continue insulin  aspart (Novolog ) up to 70 units per day via OmniPod 5 - Will leave backup Rx for Lantus  28 units daily on medication list - Patient denies personal or family history of multiple endocrine neoplasia type 2, medullary thyroid  cancer; personal history of pancreatitis or gallbladder disease. -  Recommend to check glucose continously with Dexcom G7 CGM. Provided resource for requesting replacement sensor.   Hypertension: - Currently uncontrolled with last BP above goal < 130/80 mmHg. Patient is due for updated UACR. If elevated, could consider cautiously reintroducing ACEi or ARB, as lisinopril  was d/c while he had an AKI during a previous hospitalization (in the setting of dehydration with DKA). Likely appropriate to resume at a low dose. - Reviewed long term cardiovascular and renal outcomes of uncontrolled blood pressure - Reviewed appropriate blood pressure monitoring technique and reviewed goal blood pressure. Will place orders for replacement BP cuff. Recommended to check home blood pressure and heart  rate once daily and keep log to review at next visit.  - Recommend to continue carvedilol  25 mg BID  - Obtain UACR at next PCP appt.    Hyperlipidemia/ASCVD Risk Reduction: - Currently uncontrolled with last LDL-C of 120 mg/dL above goal < 899 mg/dL with U8IF. Due for repeat lipid panel. Consider moderate intensity statin at follow-up. Did not discuss today.  - Reviewed long term complications of uncontrolled cholesterol    Follow Up Plan: Pharmacist telephone 06/14/24, Endo 06/23/24, PCP 07/05/24  Lorain Baseman, PharmD PGY1 Pharmacy Resident

## 2024-06-29 NOTE — Patient Instructions (Addendum)
 Nausea and vomiting  Recommend gastroparesis diet  Ondansetron  prn nausea , try to avoid daily use may worsen constipation Start Motegrity  2 mg po daily  Stop pantoprazole  40 mg po daily  Recommend marijuana cessation  _______________________________________________________  If your blood pressure at your visit was 140/90 or greater, please contact your primary care physician to follow up on this.  _______________________________________________________  If you are age 37 or older, your body mass index should be between 23-30. Your Body mass index is 24.24 kg/m. If this is out of the aforementioned range listed, please consider follow up with your Primary Care Provider.  If you are age 70 or younger, your body mass index should be between 19-25. Your Body mass index is 24.24 kg/m. If this is out of the aformentioned range listed, please consider follow up with your Primary Care Provider.   ________________________________________________________  The [facility] providers would like to encourage you to use MYCHART to communicate with providers for non-urgent requests or questions.  Due to long hold times on the telephone, sending your provider a message by [facility] may be a faster and more efficient way to get a response.  Please allow 48 business hours for a response.  Please remember that this is for non-urgent requests.  _______________________________________________________  [facility] is using a team-based approach to care.  Your team is made up of your doctor and two to three APPS. Our APPS (Nurse Practitioners and Physician Assistants) work with your physician to ensure care continuity for you. They are fully qualified to address your health concerns and develop a treatment plan. They communicate directly with your gastroenterologist to care for you. Seeing the Advanced Practice Practitioners on your physician's team can help you by facilitating care more promptly, often  allowing for earlier appointments, access to diagnostic testing, procedures, and other specialty referrals.   Thank you for trusting me with your gastrointestinal care. Deanna May, FNP-C

## 2024-06-29 NOTE — Progress Notes (Signed)
 Chief Complaint: nausea, vomiting, constipation Primary GI Doctor: Dr. Charlanne  HPI:  Patient is a  38  year old male patient with past medical history of type 1 diabetes, chronic vomiting, who presents or a evaluation of nausea, vomiting, and constipation.   Patient last seen in the GI office on 06/13/2023 by Harlene, [place] for recurrent nausea and vomiting.  At that time they recommended EGD and gastric emptying scan.  Patient could not take the time off to proceed with procedures.  Patient seen on 05/26/2024 for complaints of nausea and vomiting. CBG 233 with EMS.  Had 500cc fluid and zofran  with EMS. minimal leukocytosis at 11.1. Creatinine is 1.6, similar to prior. Glucose 236 but maintains normal bicarb and anion gap. pH is normal. UA with minimal ketones but I suspect this is due to dehydration. His labs are not consistent with DKA today. Will d/c home with zofran  and phenergan  suppositories.  Interval History    Patient presents for evaluation of chronic nausea and vomiting. He reports it has increased in severity  and frequency over the last several months.  Patient reports the ondansetron  as needed has helped.  Patient was prescribed pantoprazole  at the last GI appointment which he did not feel helped his symptoms.  Patient reports he has been trying to monitor his blood sugars more closely over the last year with a glucose monitor.  He reports he has made some dietary changes as well. His last was HA1c 7.4. He has lost about 10lbs in last 2 months. He has made some dietary changes.  Patient has issues with chronic constipation and currently has a bowel movement every 3 to 4 days.  Patient denies any abdominal pain or blood in stool.  Drink socially.   Patient smokes tobacco and marijuana daily.  Patient reports he is trying to stop smoking tobacco.  Wt Readings from Last 3 Encounters:  06/29/24 173 lb 12.8 oz (78.8 kg)  06/07/24 179 lb 12.8 oz (81.6 kg)  05/26/24 184 lb 15.5 oz (83.9 kg)      Past Medical History:  Diagnosis Date   Cannabis hyperemesis syndrome concurrent with and due to cannabis abuse (HCC)    History of delayed wound healing 10/2019   Tetrahydrocannabinol (THC) use disorder, severe, dependence (HCC)    Type 1 diabetes mellitus (HCC)    Vitamin D  deficiency 09/2020    Past Surgical History:  Procedure Laterality Date   TONSILLECTOMY      Current Outpatient Medications  Medication Sig Dispense Refill   acetaminophen  (TYLENOL ) 325 MG tablet Take 2 tablets (650 mg total) by mouth every 6 (six) hours as needed for mild pain (or Fever >/= 101).     Blood Pressure Monitoring (BLOOD PRESSURE CUFF) MISC Use to monitor blood pressure at least 3 times per week. 1 each 0   carvedilol  (COREG ) 25 MG tablet Take 1 tablet (25 mg total) by mouth 2 (two) times daily with a meal. 60 tablet 3   Continuous Glucose Sensor (DEXCOM G7 SENSOR) MISC Use as directed for continuous blood sugar monitoring. Change sensor every 10 days. 9 each 3   Glucagon  (BAQSIMI  ONE PACK) 3 MG/DOSE POWD Place 1 Device into the nose as needed (Low blood sugar with impaired consciousness). 2 each 3   insulin  aspart (NOVOLOG ) 100 UNIT/ML injection Use up to 70 units/day via insulin  pump. 30 mL 4   Insulin  Disposable Pump (OMNIPOD 5 DEXG7G6 PODS GEN 5) MISC Apply route every 3 (three) days. 30 each  3   insulin  glargine (LANTUS ) 100 UNIT/ML injection Inject 0.28 mLs (28 Units total) into the skin at bedtime. This is a backup prescription in case of insulin  pump failure. 10 mL 11   Magnesium  Chloride 64 MG TABS Take 1 tablet (535 mg total) by mouth once daily for 5 days Each tablet contains 64 mg magnesium  = 535 mg magnesium  chloride 5 tablet 0   ondansetron  (ZOFRAN -ODT) 4 MG disintegrating tablet Take 1 tablet (4 mg total) by mouth every 8 (eight) hours as needed. 10 tablet 0   pantoprazole  (PROTONIX ) 40 MG tablet Take 1 tablet (40 mg total) by mouth daily. 90 tablet 1   polyethylene glycol powder  (GLYCOLAX /MIRALAX ) 17 GM/SCOOP powder Mix 1capful (17 g) with 4-8 ounces with water/juice by mouth 2 (two) times daily as needed. 510 g 1   No current facility-administered medications for this visit.    Allergies as of 06/29/2024 - Review Complete 06/07/2024  Allergen Reaction Noted   Clindamycin/lincomycin Anaphylaxis 10/04/2014   Bactrim  [sulfamethoxazole -trimethoprim ] Swelling and Other (See Comments) 12/31/2019    Family History  Problem Relation Age of Onset   Hypertension Mother    Diabetes Mellitus I Brother    Colon cancer Paternal Grandmother     Review of Systems:    Constitutional: No weight loss, fever, chills, weakness or fatigue HEENT: Eyes: No change in vision               Ears, Nose, Throat:  No change in hearing or congestion Skin: No rash or itching Cardiovascular: No chest pain, chest pressure or palpitations   Respiratory: No SOB or cough Gastrointestinal: See HPI and otherwise negative Genitourinary: No dysuria or change in urinary frequency Neurological: No headache, dizziness or syncope Musculoskeletal: No new muscle or joint pain Hematologic: No bleeding or bruising Psychiatric: No history of depression or anxiety    Physical Exam:  Vital signs: BP 120/88   Pulse 71   Ht 5' 11 (1.803 m)   Wt 173 lb 12.8 oz (78.8 kg)   BMI 24.24 kg/m   Constitutional:   Pleasant male appears to be in NAD, Well developed, Well nourished, alert and cooperative Throat: Oral cavity and pharynx without inflammation, swelling or lesion.  Respiratory: Respirations even and unlabored. Lungs clear to auscultation bilaterally.   No wheezes, crackles, or rhonchi.  Cardiovascular: Normal S1, S2. Regular rate and rhythm. No peripheral edema, cyanosis or pallor.  Gastrointestinal:  Soft, nondistended, nontender. No rebound or guarding.  Hypoactive bowel sounds. No appreciable masses or hepatomegaly. Rectal:  Not performed.  Msk:  Symmetrical without gross deformities.  Without edema, no deformity or joint abnormality.  Neurologic:  Alert and  oriented x4;  grossly normal neurologically.  Skin:   Dry and intact without significant lesions or rashes.  RELEVANT LABS AND IMAGING: CBC    Latest Ref Rng & Units 06/07/2024    2:45 PM 05/26/2024   10:56 PM 05/26/2024   10:48 PM  CBC  WBC 3.4 - 10.8 x10E3/uL 11.0     Hemoglobin 13.0 - 17.7 g/dL 86.8  84.9  84.6   Hematocrit 37.5 - 51.0 % 40.8  44.0  45.0   Platelets 150 - 450 x10E3/uL 247        CMP     Latest Ref Rng & Units 06/07/2024    2:45 PM 05/26/2024   10:56 PM 05/26/2024   10:48 PM  CMP  Glucose 70 - 99 mg/dL 868   759   BUN 6 -  20 mg/dL 18   22   Creatinine 9.23 - 1.27 mg/dL 8.22   8.39   Sodium 865 - 144 mmol/L 135  131  132   Potassium 3.5 - 5.2 mmol/L 4.3  5.1  5.0   Chloride 96 - 106 mmol/L 98   99   CO2 20 - 29 mmol/L 21     Calcium  8.7 - 10.2 mg/dL 9.7     Total Protein 6.0 - 8.5 g/dL 7.0     Total Bilirubin 0.0 - 1.2 mg/dL 0.4     Alkaline Phos 44 - 121 IU/L 71     AST 0 - 40 IU/L 15     ALT 0 - 44 IU/L 15        Lab Results  Component Value Date   TSH 1.780 07/15/2022  6/28 echo-The estimated ejection fraction was in the range of 60% to 65%.   Assessment: Encounter Diagnoses  Name Primary?   Type 1 diabetes mellitus without complication (HCC) Yes   Nausea and vomiting, unspecified vomiting type    Chronic idiopathic constipation      38 year old male patient with type 1 diabetes who presents with chronic nausea and vomiting.  We discussed testing that could be conducted to evaluate for gastric emptying and/or other causes of nausea and vomiting.  The alternative therapy options would be following a gastroparesis diet along with starting Motegrity  2 mg p.o. daily which is a promotility agent to help with chronic constipation as well as can help with delayed gastric emptying.  Patient can use ondansetron  as needed for nausea.  Patient would like to hold off on any procedures at  this time.  If no improvement we discussed further evaluating with upper GI endoscopy and/or gastric emptying study.  I also recommend patient stop smoking marijuana. Can stop PPI therapy as it did not provided any benefit.   Plan: -Recommend gastroparesis diet - Can use Ondansetron  prn nausea -Start Motegrity  2 mg po daily  - stop pantoprazole  40 mg po daily  -marijuana cessation -follow-up with Dr. Charlanne in 3-4 mths  Thank you for the courtesy of this consult. Please call me with any questions or concerns.   Tippi Mccrae, FNP-C [facility] 06/29/2024, 3:25 PM  Cc: Oley Bascom RAMAN, NP

## 2024-06-30 ENCOUNTER — Other Ambulatory Visit: Payer: Self-pay

## 2024-07-01 ENCOUNTER — Telehealth: Payer: Self-pay

## 2024-07-01 ENCOUNTER — Other Ambulatory Visit (HOSPITAL_COMMUNITY): Payer: Self-pay

## 2024-07-01 ENCOUNTER — Other Ambulatory Visit: Payer: Self-pay

## 2024-07-01 ENCOUNTER — Other Ambulatory Visit: Payer: Self-pay | Admitting: Gastroenterology

## 2024-07-01 DIAGNOSIS — K5904 Chronic idiopathic constipation: Secondary | ICD-10-CM

## 2024-07-01 MED ORDER — LINACLOTIDE 72 MCG PO CAPS
72.0000 ug | ORAL_CAPSULE | Freq: Every day | ORAL | 0 refills | Status: DC
  Filled 2024-07-01: qty 30, 30d supply, fill #0

## 2024-07-01 NOTE — Telephone Encounter (Signed)
 Pharmacy Patient Advocate Encounter   Received notification from CoverMyMeds that prior authorization for Prucalopride Succinate  2MG  tablets is required/requested.   Insurance verification completed.   The patient is insured through [facility] MEDICAID .   Per test claim: PA required; PA submitted to above mentioned insurance via Latent Key/confirmation #/EOC [id number] Status is pending

## 2024-07-01 NOTE — Progress Notes (Signed)
 Linzess  for CIC sent. Insurance denied motegrity 

## 2024-07-01 NOTE — Telephone Encounter (Signed)
 Pharmacy Patient Advocate Encounter  Received notification from [facility] MEDICAID that Prior Authorization for Prucalopride Succinate  2MG  tablets has been DENIED.  Full denial letter will be uploaded to the media tab. See denial reason below.  Here are the policy requirements your request did not meet:   The request does not meet the [place]  Medicaid Preferred Drug List (PDL) trial and failure criteria for the use of a non-preferred drug. To meet the criteria for approval, you must first try two of the following preferred drugs on the [employer] Preferred Drug List (PDL) (this is a list of covered drugs):  Amitiza (Brand), Linzess , lubiprostone capsule (generic for Amitiza)  Our pharmacy records and information sent in by your doctor do not show that these drugs were tried and whether or not they helped your condition.  PA #/Case ID/Reference #: [id number]

## 2024-07-02 ENCOUNTER — Other Ambulatory Visit: Payer: Self-pay

## 2024-07-02 DIAGNOSIS — E1010 Type 1 diabetes mellitus with ketoacidosis without coma: Secondary | ICD-10-CM

## 2024-07-02 NOTE — Progress Notes (Signed)
 07/02/2024 Name: Zachary Vazquez MRN: [id number] DOB: September 25, 1986  Chief Complaint  Patient presents with   Diabetes    Zachary Vazquez is a 38 y.o. year old male who presented for a telephone visit.   They were referred to the pharmacist by their PCP for assistance in managing diabetes. PMH includes HTN, chronic nausea/vomiting, T1DM, tobacco use disorder, marijuana use.  Subjective: Patient was most recently seen by PCP, Zachary Borer, NP on 04/02/24. BP was 134/88, HR 78. Patient reported doing well. No medication changes were made. He has been following with Dr. Mercie for endocrinology, with last visit on 03/17/24. He was started on OmniPod in April 2025. Patient was last engaged by pharmacy via telephone on 04/14/24. At this time, patient reported doing well on his Omnipod, despite having some connectivity issues between the Dexcom and Omnipod. Issues resolved now that he is placing them in the same line of site. After the appt, I contacted Dr. Mercie and the [facility] to discuss concerns related to ICR of 1:1 programmed into his Omnipod, but did not receive a response. He did go to the ED on 05/26/24 for nausea and vomiting (chronic issue). His labs were not consistent with DKA. He was provided with prescriptions for promethazine  and ondansetron  PRN. He followed up with his PCP on 06/07/24. BMP stable, but Scr slightly worsened from previous. UACR improved from 559 mg/dL to 33 mg/dL. Patient had recent appt with GI to discuss chronic nausea/vomiting. He was instructed to start Linzess , continue ondansetron  PRN, and stop pantoprazole .   Today, patient reports doing well. He has not picked up the new medication prescribed by GI yet (Motegrity  was initially prescribed, but PA requires Linzess  first). He reports that BG have been ok. Denies recent connectivity issues with Dexcom and Omnipod. Has good supplies of insulin . He was not aware that he missed an endocrinology appt on 06/23/24.  He asks about  his referral to nephrology because he is concerned about his kidney function.    Care Team: Primary Care Provider: Borer Zachary RAMAN, NP ; Next Scheduled Visit: 09/09/24 Endocrinologist Dr. [name], MD; Next Scheduled Visit: 06/23/24 - needs to reschedule (no show)  Medication Access/Adherence  Current Pharmacy:  [facility] [address], [address] [place] [address] Phone: [phone] Fax: [phone]   Patient reports affordability concerns with their medications: No  Patient reports access/transportation concerns to their pharmacy: No  Patient reports adherence concerns with their medications:  No     Diabetes:  Current medications: insulin  aspart (Novolog ) - use up to 70 units per day via insulin  pump, OmniPod 5 Medications tried in the past: prior basal/bolus insulin  pen regimen: Lantus  28-30 units at bedtime. Novolog  3-8 units with meals 2-3 times per day.   Diabetes was diagnosed at age 52  OmniPod settings per Endocrinology: Basal 12AM: 0.9 units/hr 7AM: 1.1 units/hr 10PM: 0.9 units/hr  BG Target: 110 mg/dL ISF: 8:59 ICR: 1:1 (per [facility] report 03/17/24) - per DM educator note on 02/11/24: I/C: 1 (patient to put in units of insulin  into the carb box of the bolus calculator)   Current glucose readings: No access to Clarity - unable to review today. Patient reports BG are stable.  Using Dexcom G7 - denies connectivity issues. Previously counseled on how to obtain replacements.  Per [facility] report uploaded by endo on 03/18/24 (OLD):  - Most recent insulin  TDD: 41.3 - Average daily carbs entered: 15.3 - Average total daily  insulin : 41 units, basal 54%, bolus 46% - Automated mode 40%, manual mode 60% (patient was having Dexcom connectivity issues)   Date of Download: 03/18/24 (OLD) % Time CGM is active: 24.5% Average Glucose: 175 mg/dL Glucose Management Indicator: N/A  Glucose Variability: 38.4 (goal  <36%) Time in Goal:  - Time in range 70-180: 59% - Time above range: 39% - Time below range: 2%  Patient reports hypoglycemic s/sx including dizziness, shakiness, sweating which he feels has been associated with miscounting carbohydrates. Patient denies hyperglycemic symptoms including polyuria, polydipsia, polyphagia, nocturia, neuropathy, blurred vision.  Current medication access support: Medicaid insurance  Hypertension:  Current medications: carvedilol  25 mg BID Medications previously tried: lisinopril  - stopped during hospitalization 2/2 AKI  Patient has a validated, automated, upper arm home BP cuff Current blood pressure readings readings: not checking  Patient denies hypotensive s/sx including dizziness, lightheadedness.  Patient denies hypertensive symptoms including headache, chest pain, shortness of breath  Hyperlipidemia/ASCVD Risk Reduction  Current lipid lowering medications: none Medications tried in the past: none  No known cardiac family hx  Clinical ASCVD: No  The ASCVD Risk score (Arnett DK, et al., 2019) failed to calculate for the following reasons:   The 2019 ASCVD risk score is only valid for ages 22 to 4    Objective:  BP Readings from Last 3 Encounters:  06/29/24 120/88  06/07/24 128/77  05/27/24 (!) 144/94    Lab Results  Component Value Date   HGBA1C 7.4 (A) 04/02/2024   HGBA1C 6.5 (A) 02/02/2024   HGBA1C 5.5 10/16/2023    Lab Results  Component Value Date   CREATININE 1.77 (H) 06/07/2024   BUN 18 06/07/2024   NA 135 06/07/2024   K 4.3 06/07/2024   CL 98 06/07/2024   CO2 21 06/07/2024    Lab Results  Component Value Date   CHOL 190 10/12/2021   HDL 58 10/12/2021   LDLCALC 121 (H) 10/12/2021   TRIG 57 10/12/2021   CHOLHDL 3.3 10/12/2021    Medications Reviewed Today     Reviewed by [name], RPH (Pharmacist) on 07/02/24 at 1403  Med List Status: <None>   Medication Order Taking? Sig Documenting Provider Last Dose  Status Informant  acetaminophen  (TYLENOL ) 325 MG tablet [id number]  Take 2 tablets (650 mg total) by mouth every 6 (six) hours as needed for mild pain (or Fever >/= 101). Romelle Booty, MD  Active Self           Med Note MARZETTA, [name]   Thu Sep 11, 2023  3:44 PM) For headaches  Blood Pressure Monitoring (BLOOD PRESSURE CUFF) MISC [id number]  Use to monitor blood pressure at least 3 times per week. Oley Zachary RAMAN, NP  Active   carvedilol  (COREG ) 25 MG tablet [id number] Yes Take 1 tablet (25 mg total) by mouth 2 (two) times daily with a meal. Oley Zachary RAMAN, NP  Active   Continuous Glucose Sensor (DEXCOM G7 SENSOR) MISC [id number] Yes Use as directed for continuous blood sugar monitoring. Change sensor every 10 days. Nichols, Tonya S, NP  Active   Glucagon  (BAQSIMI  ONE PACK) 3 MG/DOSE POWD [id number]  Place 1 Device into the nose as needed (Low blood sugar with impaired consciousness). Motwani, Komal, MD  Active   insulin  aspart (NOVOLOG ) 100 UNIT/ML injection [id number] Yes Use up to 70 units/day via insulin  pump. Vazquez, Iraq, MD  Active   Insulin  Disposable Pump (OMNIPOD 5 DEXG7G6 PODS GEN 5) MISC [id number] Yes Apply route every 3 (three)  days. Vazquez, [place], MD  Active   insulin  glargine (LANTUS ) 100 UNIT/ML injection [id number]  Inject 0.28 mLs (28 Units total) into the skin at bedtime. This is a backup prescription in case of insulin  pump failure. Oley Zachary RAMAN, NP  Active   linaclotide  (LINZESS ) 72 MCG capsule [id number]  Take 1 capsule (72 mcg total) by mouth daily before breakfast. May, Deanna J, NP  Active   Magnesium  Chloride 64 MG TABS [id number]  Take 1 tablet (535 mg total) by mouth once daily for 5 days Each tablet contains 64 mg magnesium  = 535 mg magnesium  chloride   Active   ondansetron  (ZOFRAN -ODT) 4 MG disintegrating tablet [id number] Yes Take 1 tablet (4 mg total) by mouth daily as needed for refractory nausea / vomiting. May, Deanna J, NP  Active     Discontinued 07/02/24 1403 (Change  in therapy)            Med Note>> [name], [facility]   07/02/2024  2:03 PM Discontinued by GI 06/30/24    polyethylene glycol powder (GLYCOLAX /MIRALAX ) 17 GM/SCOOP powder [id number]  Mix 1capful (17 g) with 4-8 ounces with water/juice by mouth 2 (two) times daily as needed. Oley Zachary RAMAN, NP  Active Self            Assessment/Plan:   Diabetes: - Currently uncontrolled with last A1C of 7.4% above goal < 7%, and slightly worsened from 6.5% in the setting of switching to OmniPod insulin  pump (managed by endo). Expect control will improve with continued use and comfort with the insulin  pump. Assisted patient in rescheduling with the endocrinology office today. Will again reach out to Dr. Mercie to notify, per [facility] report uploaded on 03/18/24, ICR is set to 1:1 and DM educator note stated that patient should put units of insulin  in carb box in OmniPod app rather than allowing the pump to calculate the dose based on carbs consumed. Would anticipated ICR of 1:12 with current TDD of ~41 units/day. - Reviewed long term cardiovascular and renal outcomes of uncontrolled blood sugar - Reviewed goal A1c, goal fasting, and goal 2 hour post prandial glucose - Recommend to continue insulin  aspart (Novolog ) up to 70 units per day via OmniPod 5 - Will leave backup Rx for Lantus  28 units daily on medication list - Patient denies personal or family history of multiple endocrine neoplasia type 2, medullary thyroid  cancer; personal history of pancreatitis or gallbladder disease. - Recommend to check glucose continously with Dexcom G7 CGM. Will [facility] to [facility] account at follow-up.  - Provided patient with phone number to reschedule with Endo (scheduled for 07/12/24)   Hypertension: - Currently variably uncontrolled with last BP above goal < 130/80 mmHg. Repeat UACR was much better controlled at 33 mg/g, just slightly above 30 mg/g. Lisinopril  was previously d/c while he had an AKI  during previous hospitalization (in the setting of dehydration with DKA). Likely appropriate to resume at a low dose. Will delay initiation today, since patient was recently referred to nephrology.  - Reviewed long term cardiovascular and renal outcomes of uncontrolled blood pressure - Reviewed appropriate blood pressure monitoring technique and reviewed goal blood pressure.  - Recommend to continue carvedilol  25 mg BID  - Consider addition of low dose ACEi/ARB at follow-up   Hyperlipidemia/ASCVD Risk Reduction: - Currently uncontrolled with last LDL-C of 120 mg/dL above goal < 899 mg/dL with U8IF. Due for repeat lipid panel. Consider moderate intensity statin at follow-up. Did not discuss today.  -  Reviewed long term complications of uncontrolled cholesterol - Repeat lipid panel at PCP appt on 09/09/24    Follow Up Plan: Pharmacist telephone 08/11/24, Endo 07/12/24, PCP 09/09/24   Lorain Baseman, PharmD [facility] [phone]

## 2024-07-05 ENCOUNTER — Ambulatory Visit: Payer: Self-pay | Admitting: Nurse Practitioner

## 2024-07-12 ENCOUNTER — Ambulatory Visit: Payer: Self-pay | Admitting: Endocrinology

## 2024-07-12 ENCOUNTER — Encounter: Payer: Self-pay | Admitting: Endocrinology

## 2024-07-12 ENCOUNTER — Ambulatory Visit (INDEPENDENT_AMBULATORY_CARE_PROVIDER_SITE_OTHER): Admitting: Endocrinology

## 2024-07-12 VITALS — BP 110/60 | HR 72 | Resp 20 | Ht 71.0 in | Wt 170.0 lb

## 2024-07-12 DIAGNOSIS — E1065 Type 1 diabetes mellitus with hyperglycemia: Secondary | ICD-10-CM | POA: Diagnosis not present

## 2024-07-12 LAB — POCT GLYCOSYLATED HEMOGLOBIN (HGB A1C): Hemoglobin A1C: 7.4 % — AB (ref 4.0–5.6)

## 2024-07-12 NOTE — Progress Notes (Signed)
 Outpatient Endocrinology Note Zachary Lucianna Ostlund, MD   Patient's Name: Zachary Vazquez    DOB: 07/14/1986    MRN: [id number]                                                    REASON OF VISIT: Follow up for type 1 diabetes mellitus  REFERRING PROVIDER: Oley Bascom RAMAN, NP  PCP: Oley Bascom RAMAN, NP  HISTORY OF PRESENT ILLNESS:   Zachary Vazquez is a 38 y.o. old male with past medical history listed below, is here for follow up for type 1 diabetes mellitus.   Pertinent Diabetes History: Patient was diagnosed with type 1 diabetes mellitus in 2012 at the age of 15 years.  History of DKA or diabetes related hospitalizations: yes  Previous diabetes education: Yes   Family h/o diabetes mellitus: brother with type 1 diabetes mellitus.    He has fair control of type 1 diabetes mellitus.  Insulin  pump therapy OmniPod 5 was started in April 2025.  Chronic Diabetes Complications : Retinopathy: no. Last ophthalmology exam was done on annually, reportedly. Nephropathy: yes, urine microalbuminuria present. AKI on ? CKD.  Used to be on lisinopril  was discontinued in the past due to AKI. Peripheral neuropathy: yes Coronary artery disease: no Stroke: no  Relevant comorbidities and cardiovascular risk factors: Obesity: no Body mass index is 23.71 kg/m.  Hypertension: Yes  Hyperlipidemia : Yes, not on statin   Current / Home Diabetic regimen includes:  OmniPod 5 with Dexcom G7, using NovoLog  U100  Basal : 23.1 units/day  12 AM: 0.9 units/hr 7 AM: 1.1  10 PM: 0.9  Sensitivity/correction factor  12 AM : 1:40  Carb ratio : 12 AM : 1:1, bolusing carb count 2-10  Active insulin  time 4 hours  Target glucose 110  Prior diabetic medications: Basal bolus regimen prior to insulin  pump therapy Lantus  28-30 units at bedtime. Novolog  adjust based on meal / glucose 3-8 units with meals 2-3 times a day.    CONTINUOUS GLUCOSE MONITORING SYSTEM (CGMS) INTERPRETATION:                           OmniPod 5 Pump & Sensor DEXCOM G7 Download (Reviewed and summarized below.)  Dates: September 2 to September 15 , 2025, 14 days   Average daily carbs entered: 10.6g Average total daily insulin :  22.4 units, Basal: 65 %, Food Bolus: 35%  Automated mode 72%, manual mode 28%      Trends:  Random hyperglycemia postprandially with blood sugar up to the range of 250s related to late meal bolus and not enough meal bolus.  Occasional mild hypoglycemia when meal boluses late after eating.  Some of the days acceptable blood sugar.  No concerning hypoglycemia.  Hypoglycemia: Patient has minor hypoglycemic episodes. Patient has hypoglycemia awareness, has a glucagon  ER kit.  Factors modifying glucose control: 1.  Diabetic diet assessment: 2-3 meals a day.  2.  Staying active or exercising: Physically active at work, works at [employer].  3.  Medication compliance: compliant most of the time.  Interval history  Insulin  pump and CGM data as reviewed above.  Random hyperglycemia.  He has been out of automated more frequently as well.  Some of the times he does not have sensor on.  Recently has also been  following with pharmacist.  He has no numbness and tingling of the feet.  No other complaints today.  REVIEW OF SYSTEMS As per history of present illness.   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 (HCC)    History of delayed wound healing 10/2019   Tetrahydrocannabinol (THC) use disorder, severe, dependence (HCC)    Type 1 diabetes mellitus (HCC)    Vitamin D  deficiency 09/2020    PAST SURGICAL HISTORY: Past Surgical History:  Procedure Laterality Date   TONSILLECTOMY      ALLERGIES: Allergies  Allergen Reactions   Clindamycin/Lincomycin Anaphylaxis   Bactrim  [Sulfamethoxazole -Trimethoprim ] Swelling and Other (See Comments)    Eyes swell and bumps appear on/near the eyes      FAMILY HISTORY:  Family History  Problem Relation Age of Onset   Hypertension Mother    Diabetes Mellitus I Brother    Colon cancer Paternal Grandmother     SOCIAL HISTORY: Social History   Socioeconomic History   Marital status: Single    Spouse name: Not on file   Number of children: Not on file   Years of education: Not on file   Highest education level: Not on file  Occupational History   Not on file  Tobacco Use   Smoking status: Every Day    Current packs/day: 1.00    Average packs/day: 1 pack/day for 15.0 years (15.0 ttl pk-yrs)    Types: Cigarettes   Smokeless tobacco: Never  Vaping Use   Vaping status: Never Used  Substance and Sexual Activity   Alcohol use: Yes    Comment: occ   Drug use: Yes    Types: Marijuana    Comment: occ   Sexual activity: Yes    Birth control/protection: None  Other Topics Concern   Not on file  Social History Narrative   Not on file   Social Drivers of Health   Financial Resource Strain: Not on file  Food Insecurity: No Food Insecurity (06/01/2024)   Hunger Vital Sign    Worried About Running Out of Food in the Last Year: Never true    Ran Out of Food in the Last Year: Never true  Transportation Needs: No Transportation Needs (06/01/2024)   PRAPARE - Administrator, Civil Service (Medical): No    Lack of Transportation (Non-Medical): No  Physical Activity: Not on file  Stress: Not on file  Social Connections: Not on file    MEDICATIONS:  Current Outpatient Medications  Medication Sig Dispense Refill   acetaminophen  (TYLENOL ) 325 MG tablet Take 2 tablets (650 mg total) by mouth every 6 (six) hours as needed for mild pain (or Fever >/= 101).     Blood Pressure Monitoring (BLOOD PRESSURE CUFF) MISC Use to monitor blood pressure at least 3 times per week. 1 each 0   carvedilol  (COREG ) 25 MG tablet Take 1 tablet (25 mg total) by mouth 2 (two) times daily with a meal. 60 tablet 3   Continuous Glucose Sensor  (DEXCOM G7 SENSOR) MISC Use as directed for continuous blood sugar monitoring. Change sensor every 10 days. 9 each 3   Glucagon  (BAQSIMI  ONE PACK) 3 MG/DOSE POWD Place 1 Device into the nose as needed (Low blood sugar with impaired consciousness). 2 each 3   insulin  aspart (NOVOLOG ) 100 UNIT/ML injection Use up to 70 units/day via insulin  pump. 30 mL 4   Insulin  Disposable Pump (OMNIPOD 5  DEXG7G6 PODS GEN 5) MISC Apply route every 3 (three) days. 30 each 3   insulin  glargine (LANTUS ) 100 UNIT/ML injection Inject 0.28 mLs (28 Units total) into the skin at bedtime. This is a backup prescription in case of insulin  pump failure. 10 mL 11   linaclotide  (LINZESS ) 72 MCG capsule Take 1 capsule (72 mcg total) by mouth daily before breakfast. 30 capsule 0   Magnesium  Chloride 64 MG TABS Take 1 tablet (535 mg total) by mouth once daily for 5 days Each tablet contains 64 mg magnesium  = 535 mg magnesium  chloride 5 tablet 0   ondansetron  (ZOFRAN -ODT) 4 MG disintegrating tablet Take 1 tablet (4 mg total) by mouth daily as needed for refractory nausea / vomiting. 30 tablet 0   polyethylene glycol powder (GLYCOLAX /MIRALAX ) 17 GM/SCOOP powder Mix 1capful (17 g) with 4-8 ounces with water/juice by mouth 2 (two) times daily as needed. 510 g 1   No current facility-administered medications for this visit.    PHYSICAL EXAM: Vitals:   07/12/24 1048  BP: 110/60  Pulse: 72  Resp: 20  SpO2: 99%  Weight: 170 lb (77.1 kg)  Height: 5' 11 (1.803 m)     Body mass index is 23.71 kg/m.  Wt Readings from Last 3 Encounters:  07/12/24 170 lb (77.1 kg)  06/29/24 173 lb 12.8 oz (78.8 kg)  06/07/24 179 lb 12.8 oz (81.6 kg)    General: Well developed, well nourished male in no apparent distress.  HEENT: AT/[place], no external lesions.  Eyes: Conjunctiva clear and no icterus. Neck: Neck supple  Lungs: Respirations not labored Neurologic: Alert, oriented, normal speech Extremities / Skin: Dry.  Psychiatric: Does not  appear depressed or anxious  Diabetic Foot Exam - Simple   Simple Foot Form Diabetic Foot exam was performed with the following findings: Yes 07/12/2024 11:16 AM  Visual Inspection No deformities, no ulcerations, no other skin breakdown bilaterally: Yes Sensation Testing Intact to touch and monofilament testing bilaterally: Yes Pulse Check Posterior Tibialis and Dorsalis pulse intact bilaterally: Yes Comments    LABS Reviewed Lab Results  Component Value Date   HGBA1C 7.4 (A) 07/12/2024   HGBA1C 7.4 (A) 04/02/2024   HGBA1C 6.5 (A) 02/02/2024   No results found for: FRUCTOSAMINE Lab Results  Component Value Date   CHOL 190 10/12/2021   HDL 58 10/12/2021   LDLCALC 121 (H) 10/12/2021   TRIG 57 10/12/2021   CHOLHDL 3.3 10/12/2021   Lab Results  Component Value Date   MICRALBCREAT 33 (H) 06/07/2024   MICRALBCREAT 559 (H) 12/18/2022   Lab Results  Component Value Date   CREATININE 1.77 (H) 06/07/2024   No results found for: GFR  ASSESSMENT / PLAN  1. Uncontrolled type 1 diabetes mellitus with hyperglycemia (HCC)     Diabetes Mellitus type 1, complicated by diabetic neuropathy, microalbuminuria - Diabetic status / severity: Fair control.  Lab Results  Component Value Date   HGBA1C 7.4 (A) 07/12/2024    - Hemoglobin A1c goal : <6.5%  OmniPod 5 insulin  pump therapy was started in April 2025.  Overall he stable blood sugar, no concerning hypoglycemia.  Postprandial hyperglycemia related to late meal bolus, no meal bolus and not enough meal bolus.  - Medications: Diabetes regimen.  Adjusted pump setting as follows.  OmniPod 5 with Dexcom G7, using NovoLog  U100  Basal  12 AM: 0.9 units/hr 7 AM: 1.0 10 PM: 0.9  Sensitivity/correction factor  12 AM : 1:40, changed 1:35  Carb ratio : 12 AM :  1:1  Advised to use carb count 2-10, based on meal size.   Active insulin  time 4 hours  Target glucose 110  He reports he does not feel comfortable carb  counting yet.  Not able to keep carb ratio according to carb counting.  Will refer to diabetic educator to learn carb counting.  We can also do custom food carb counting small meal 30 gram, medium meal 60 g and large meal 90 g.  In that case we can do carb ratio of 1: 10.  For now we will keep the same carb ratio until he sees diabetic educator for learning carb counting.  - Home glucose testing: CGM and check as needed. - Discussed/ Gave Hypoglycemia treatment plan.  # Consult : Will refer to diabetic educator for continue pump training and carb counting.  # Annual urine for microalbuminuria/ creatinine ratio, + microalbuminuria currently.  He has AKI on ? CKD patient reports he has plan to see nephrology in the near future for evaluation of renal insufficiency.  He used to be on lisinopril  was stopped in the past due to AKI and changed to carvedilol  for blood pressure control.  He has improvement on urine microalbumin creatinine ratio.  Will hold off on restarting lisinopril  until evaluated by nephrology.  Last  Lab Results  Component Value Date   MICRALBCREAT 33 (H) 06/07/2024    # Foot check nightly / neuropathy.  # Annual dilated diabetic eye exams.   - Diet: Make healthy diabetic food choices - Life style / activity / exercise: Discussed.  2. Blood pressure  -  BP Readings from Last 1 Encounters:  07/12/24 110/60    - Control is in target.  - No change in current plans.  3. Lipid status / Hyperlipidemia - Last  Lab Results  Component Value Date   LDLCALC 121 (H) 10/12/2021   -Currently not on statin therapy.   Diagnoses and all orders for this visit:  Uncontrolled type 1 diabetes mellitus with hyperglycemia (HCC) -     POCT glycosylated hemoglobin (Hb A1C) -     Amb Referral to Nutrition and Diabetic Education    DISPOSITION Follow up in clinic in 3 months suggested.   All questions answered and patient verbalized understanding of the plan.  Zachary Kimbria Camposano,  MD [facility] Endocrinology [facility] [address], [address] [place], [place] [address] Phone # [phone]  At least part of this note was generated using voice recognition software. Inadvertent word errors may have occurred, which were not recognized during the proofreading process.

## 2024-07-15 ENCOUNTER — Other Ambulatory Visit: Payer: Self-pay

## 2024-07-16 ENCOUNTER — Other Ambulatory Visit: Payer: Self-pay

## 2024-08-04 ENCOUNTER — Other Ambulatory Visit: Payer: Self-pay

## 2024-08-04 ENCOUNTER — Other Ambulatory Visit: Payer: Self-pay | Admitting: Gastroenterology

## 2024-08-04 DIAGNOSIS — K5904 Chronic idiopathic constipation: Secondary | ICD-10-CM

## 2024-08-04 MED ORDER — LINACLOTIDE 72 MCG PO CAPS
72.0000 ug | ORAL_CAPSULE | Freq: Every day | ORAL | 0 refills | Status: DC
  Filled 2024-08-04: qty 30, 30d supply, fill #0

## 2024-08-06 ENCOUNTER — Telehealth: Payer: Self-pay | Admitting: Gastroenterology

## 2024-08-06 ENCOUNTER — Other Ambulatory Visit: Payer: Self-pay

## 2024-08-06 DIAGNOSIS — K5904 Chronic idiopathic constipation: Secondary | ICD-10-CM

## 2024-08-06 MED ORDER — LINACLOTIDE 72 MCG PO CAPS
72.0000 ug | ORAL_CAPSULE | Freq: Every day | ORAL | 2 refills | Status: DC
  Filled 2024-08-27: qty 90, 90d supply, fill #0

## 2024-08-06 NOTE — Telephone Encounter (Signed)
 Refill sent to pharmacy for Linzess  until patient appointment in December

## 2024-08-06 NOTE — Telephone Encounter (Signed)
Inbound call from patient requesting refill for Linzess. Please advise.  ?

## 2024-08-11 ENCOUNTER — Other Ambulatory Visit: Payer: Self-pay

## 2024-08-11 ENCOUNTER — Telehealth: Payer: Self-pay

## 2024-08-11 NOTE — Telephone Encounter (Signed)
 Call patient for scheduled telephone appt today. Reported he was not feeling well and requested to reschedule. Confirmed that he did not have any urgent needs as far as his medications and blood sugars. Confirmed that he is planning to attend appt with nutritionist on 08/23/24 to discuss carb counting.   Lorain Baseman, PharmD [facility] [phone]

## 2024-08-11 NOTE — Progress Notes (Deleted)
 08/11/2024 Name: Zachary Vazquez MRN: [id number] DOB: Mar 03, 1986  No chief complaint on file.   Zachary Vazquez is a 38 y.o. year old male who presented for a telephone visit.   They were referred to the pharmacist by their PCP for assistance in managing diabetes. PMH includes HTN, chronic nausea/vomiting, T1DM, tobacco use disorder, marijuana use.  Subjective: Patient was most recently seen by PCP, Zachary Borer, NP on 04/02/24. BP was 134/88, HR 78. Patient reported doing well. No medication changes were made. He has been following with Zachary Vazquez for endocrinology, with last visit on 03/17/24. He was started on OmniPod in April 2025. Patient was last engaged by pharmacy via telephone on 04/14/24. At this time, patient reported doing well on his Omnipod, despite having some connectivity issues between the Dexcom and Omnipod. Issues resolved now that he is placing them in the same line of site. After the appt, I contacted Zachary Vazquez and the CDC[facility] to discuss concerns related to ICR of 1:1 programmed into his Omnipod. He did go to the ED on 05/26/24 for nausea and vomiting (chronic issue). His labs were not consistent with DKA. He was provided with prescriptions for promethazine  and ondansetron  PRN. He followed up with his PCP on 06/07/24. BMP stable, but Scr slightly worsened from previous. UACR improved from 559 mg/dL to 33 mg/dL. Patient had recent appt with GI to discuss chronic nausea/vomiting. He was instructed to start Linzess , continue ondansetron  PRN, and stop pantoprazole . At pharmacy telephone call on 07/02/24, patient reported doing well. He was instructed to reschedule with endocrinology, who he saw on 07/12/24. A1C was stable at 7.4%. ISF was changed from 1:40 to 1:35. ICR was not updated, as patient did not feel comfortable carb counting yet - so it is still set at 1:1 and patient will inject 2-10 units with each meal.  Connect to clarity Nephrology?  Today, patient reports doing well.  He has not picked up the new medication prescribed by GI yet (Motegrity  was initially prescribed, but PA requires Linzess  first). He reports that BG have been ok. Denies recent connectivity issues with Dexcom and Omnipod. Has good supplies of insulin . He was not aware that he missed an endocrinology appt on 06/23/24.  He asks about his referral to nephrology because he is concerned about his kidney function.    Care Team: Primary Care Provider: Borer Zachary RAMAN, NP ; Next Scheduled Visit: 09/09/24 CDCES: 08/23/24 - Zachary Vazquez, RD Endocrinologist Dr. [name], MD; Next Scheduled Visit: 10/11/24   Medication Access/Adherence  Current Pharmacy:  [facility] [address], [address] [place] [address] Phone: [phone] Fax: [phone]   Patient reports affordability concerns with their medications: No  Patient reports access/transportation concerns to their pharmacy: No  Patient reports adherence concerns with their medications:  No     Diabetes:  Current medications: insulin  aspart (Novolog ) - use up to 70 units per day via insulin  pump, OmniPod 5 Medications tried in the past: prior basal/bolus insulin  pen regimen: Lantus  28-30 units at bedtime. Novolog  3-8 units with meals 2-3 times per day.   Diabetes was diagnosed at age 77  OmniPod settings per Endocrinology: Basal 12AM: 0.9 units/hr 7AM: 1.0  units/hr 10PM: 0.9 units/hr  BG Target: 110 mg/dL ISF: 8:64 ICR: 1:1 (patient to put in units of insulin  into the carb box of the bolus calculator)   Current glucose readings: No access to Clarity - unable to review today. Patient  reports BG are stable.  Using Dexcom G7 - denies connectivity issues. Previously counseled on how to obtain replacements.  Per [facility] report uploaded by endo on 03/18/24 (OLD):  - Most recent insulin  TDD: 41.3 - Average daily carbs entered: 15.3 - Average total daily insulin : 41 units, basal  54%, bolus 46% - Automated mode 40%, manual mode 60% (patient was having Dexcom connectivity issues)   Date of Download: 03/18/24 (OLD) % Time CGM is active: 24.5% Average Glucose: 175 mg/dL Glucose Management Indicator: N/A  Glucose Variability: 38.4 (goal <36%) Time in Goal:  - Time in range 70-180: 59% - Time above range: 39% - Time below range: 2%  Patient reports hypoglycemic s/sx including dizziness, shakiness, sweating which he feels has been associated with miscounting carbohydrates. Patient denies hyperglycemic symptoms including polyuria, polydipsia, polyphagia, nocturia, neuropathy, blurred vision.  Current medication access support: Medicaid insurance  Hypertension:  Current medications: carvedilol  25 mg BID Medications previously tried: lisinopril  - stopped during hospitalization 2/2 AKI  Patient has a validated, automated, upper arm home BP cuff Current blood pressure readings readings: not checking  Patient denies hypotensive s/sx including dizziness, lightheadedness.  Patient denies hypertensive symptoms including headache, chest pain, shortness of breath  Hyperlipidemia/ASCVD Risk Reduction  Current lipid lowering medications: none Medications tried in the past: none  No known cardiac family hx  Clinical ASCVD: No  The ASCVD Risk score (Arnett DK, et al., 2019) failed to calculate for the following reasons:   The 2019 ASCVD risk score is only valid for ages 64 to 68    Objective:  BP Readings from Last 3 Encounters:  07/12/24 110/60  06/29/24 120/88  06/07/24 128/77    Lab Results  Component Value Date   HGBA1C 7.4 (A) 07/12/2024   HGBA1C 7.4 (A) 04/02/2024   HGBA1C 6.5 (A) 02/02/2024    Lab Results  Component Value Date   CREATININE 1.77 (H) 06/07/2024   BUN 18 06/07/2024   NA 135 06/07/2024   K 4.3 06/07/2024   CL 98 06/07/2024   CO2 21 06/07/2024    Lab Results  Component Value Date   CHOL 190 10/12/2021   HDL 58 10/12/2021    LDLCALC 121 (H) 10/12/2021   TRIG 57 10/12/2021   CHOLHDL 3.3 10/12/2021    Medications Reviewed Today   Medications were not reviewed in this encounter     Assessment/Plan:   Diabetes: - Currently uncontrolled with last A1C of 7.4% above goal < 7%, and slightly worsened from 6.5% in the setting of switching to OmniPod insulin  pump (managed by endo). Expect control will improve with continued use and comfort with the insulin  pump. Assisted patient in rescheduling with the endocrinology office today. Will again reach out to Zachary Vazquez to notify, per [facility] report uploaded on 03/18/24, ICR is set to 1:1 and DM educator note stated that patient should put units of insulin  in carb box in OmniPod app rather than allowing the pump to calculate the dose based on carbs consumed. Would anticipated ICR of 1:12 with current TDD of ~41 units/day. - Reviewed long term cardiovascular and renal outcomes of uncontrolled blood sugar - Reviewed goal A1c, goal fasting, and goal 2 hour post prandial glucose - Recommend to continue insulin  aspart (Novolog ) up to 70 units per day via OmniPod 5 - Will leave backup Rx for Lantus  28 units daily on medication list - Patient denies personal or family history of multiple endocrine neoplasia type 2, medullary thyroid  cancer; personal history of pancreatitis or  gallbladder disease. - Recommend to check glucose continously with Dexcom G7 CGM. Will connect [facility] to [facility] account at follow-up.  - Provided patient with phone number to reschedule with Endo (scheduled for 07/12/24)   Hypertension: - Currently variably uncontrolled with last BP above goal < 130/80 mmHg. Repeat UACR was much better controlled at 33 mg/g, just slightly above 30 mg/g. Lisinopril  was previously d/c while he had an AKI during previous hospitalization (in the setting of dehydration with DKA). Likely appropriate to resume at a low dose. Will delay initiation today, since patient  was recently referred to nephrology.  - Reviewed long term cardiovascular and renal outcomes of uncontrolled blood pressure - Reviewed appropriate blood pressure monitoring technique and reviewed goal blood pressure.  - Recommend to continue carvedilol  25 mg BID  - Consider addition of low dose ACEi/ARB at follow-up   Hyperlipidemia/ASCVD Risk Reduction: - Currently uncontrolled with last LDL-C of 120 mg/dL above goal < 899 mg/dL with U8IF. Due for repeat lipid panel. Consider moderate intensity statin at follow-up. Did not discuss today.  - Reviewed long term complications of uncontrolled cholesterol - Repeat lipid panel at PCP appt on 09/09/24    Follow Up Plan: Pharmacist telephone 08/11/24, Endo 07/12/24, PCP 09/09/24   Lorain Baseman, PharmD [facility] [phone]

## 2024-08-23 ENCOUNTER — Ambulatory Visit: Admitting: Dietician

## 2024-08-25 ENCOUNTER — Other Ambulatory Visit: Payer: Self-pay | Admitting: Gastroenterology

## 2024-08-25 ENCOUNTER — Other Ambulatory Visit: Payer: Self-pay

## 2024-08-25 DIAGNOSIS — R112 Nausea with vomiting, unspecified: Secondary | ICD-10-CM

## 2024-08-27 ENCOUNTER — Other Ambulatory Visit: Payer: Self-pay

## 2024-09-01 ENCOUNTER — Other Ambulatory Visit: Payer: Self-pay

## 2024-09-09 ENCOUNTER — Encounter: Payer: Self-pay | Admitting: Nurse Practitioner

## 2024-09-09 ENCOUNTER — Ambulatory Visit (INDEPENDENT_AMBULATORY_CARE_PROVIDER_SITE_OTHER): Payer: Self-pay | Admitting: Nurse Practitioner

## 2024-09-09 VITALS — BP 128/86 | HR 80 | Wt 167.0 lb

## 2024-09-09 DIAGNOSIS — E1010 Type 1 diabetes mellitus with ketoacidosis without coma: Secondary | ICD-10-CM | POA: Diagnosis not present

## 2024-09-09 NOTE — Progress Notes (Unsigned)
 Subjective   Patient ID: Zachary Vazquez, male    DOB: 19-Jul-1986, 38 y.o.   MRN: [id number]  Chief Complaint  Patient presents with   91month follow up    Referring provider: Oley Bascom RAMAN, NP  Zachary Vazquez is a 38 y.o. 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  He has followed with endocrinology for type 1 diabetes since his last visit here.  His next upcoming appointment with endocrinology will be next week.  We do have pharmacy referral placed for medication management as well.  Overall patient has been doing well with no new issues or concerns.  Denies f/c/s, n/v/d, hemoptysis, PND, leg swelling Denies chest pain or edema          Allergies  Allergen Reactions   Clindamycin/Lincomycin Anaphylaxis   Bactrim  [Sulfamethoxazole -Trimethoprim ] Swelling and Other (See Comments)    Eyes swell and bumps appear on/near the eyes     Immunization History  Administered Date(s) Administered   Tdap 12/15/2019    Tobacco History: Social History   Tobacco Use  Smoking Status Every Day   Current packs/day: 1.00   Average packs/day: 1 pack/day for 15.0 years (15.0 ttl pk-yrs)   Types: Cigarettes  Smokeless Tobacco Never   Ready to quit: Not Answered Counseling given: Not Answered   Outpatient Encounter Medications as of 09/09/2024  Medication Sig   acetaminophen  (TYLENOL ) 325 MG tablet Take 2 tablets (650 mg total) by mouth every 6 (six) hours as needed for mild pain (or Fever >/= 101).   Blood Pressure Monitoring (BLOOD PRESSURE CUFF) MISC Use to monitor blood pressure at least 3 times per week.   carvedilol  (COREG ) 25 MG tablet Take 1 tablet (25 mg  total) by mouth 2 (two) times daily with a meal.   Continuous Glucose Sensor (DEXCOM G7 SENSOR) MISC Use as directed for continuous blood sugar monitoring. Change sensor every 10 days.   Glucagon  (BAQSIMI  ONE PACK) 3 MG/DOSE POWD Place 1 Device into the nose as needed (Low blood sugar with impaired consciousness).   insulin  aspart (NOVOLOG ) 100 UNIT/ML injection Use up to 70 units/day via insulin  pump.   Insulin  Disposable Pump (OMNIPOD 5 DEXG7G6 PODS GEN 5) MISC Apply route every 3 (three) days.   insulin  glargine (LANTUS ) 100 UNIT/ML injection Inject 0.28 mLs (28 Units total) into the skin at bedtime. This is a backup prescription in case of insulin  pump failure.   linaclotide  (LINZESS ) 72 MCG capsule Take 1 capsule (72 mcg total) by mouth daily before breakfast.   Magnesium  Chloride 64 MG TABS Take 1 tablet (535 mg total) by mouth once daily for 5 days Each tablet contains 64 mg magnesium  = 535 mg magnesium  chloride   ondansetron  (ZOFRAN -ODT) 4 MG disintegrating tablet Take 1 tablet (4 mg total) by mouth daily as needed for refractory nausea / vomiting.   polyethylene glycol powder (GLYCOLAX /MIRALAX ) 17 GM/SCOOP powder Mix 1capful (17 g) with 4-8 ounces with water/juice by mouth 2 (two) times daily as needed.   No facility-administered encounter medications on file as of 09/09/2024.    Review of Systems  Review of Systems  Constitutional: Negative.   HENT: Negative.    Cardiovascular: Negative.   Gastrointestinal: Negative.  Allergic/Immunologic: Negative.   Neurological: Negative.   Psychiatric/Behavioral: Negative.       Objective:   BP 128/86 (BP Location: Left Arm, Patient Position: Sitting)   Pulse 80   Wt 167 lb (75.8 kg)   SpO2 98%   BMI 23.29 kg/m   Wt Readings from Last 5 Encounters:  09/09/24 167 lb (75.8 kg)  07/12/24 170 lb (77.1 kg)  06/29/24 173 lb 12.8 oz (78.8 kg)  06/07/24 179 lb 12.8 oz (81.6 kg)  05/26/24 184 lb 15.5 oz (83.9 kg)     Physical  Exam Vitals and nursing note reviewed.  Constitutional:      General: He is not in acute distress.    Appearance: He is well-developed.  Cardiovascular:     Rate and Rhythm: Normal rate and regular rhythm.  Pulmonary:     Effort: Pulmonary effort is normal.     Breath sounds: Normal breath sounds.  Skin:    General: Skin is warm and dry.  Neurological:     Mental Status: He is alert and oriented to person, place, and time.       Assessment & Plan:    Uncontrolled type 1 diabetes mellitus with ketoacidosis without coma, with long-term current use of insulin  (HCC)  Continue to follow with endocrinology  Pharmacy will continue to follow for medication management as well.  Return in about 3 months (around 12/10/2024).       Bascom GORMAN Borer, NP 09/10/2024

## 2024-09-15 ENCOUNTER — Other Ambulatory Visit (HOSPITAL_COMMUNITY): Payer: Self-pay

## 2024-09-15 ENCOUNTER — Other Ambulatory Visit: Payer: Self-pay

## 2024-09-15 ENCOUNTER — Other Ambulatory Visit (INDEPENDENT_AMBULATORY_CARE_PROVIDER_SITE_OTHER): Payer: Self-pay

## 2024-09-15 DIAGNOSIS — I152 Hypertension secondary to endocrine disorders: Secondary | ICD-10-CM

## 2024-09-15 DIAGNOSIS — E1159 Type 2 diabetes mellitus with other circulatory complications: Secondary | ICD-10-CM

## 2024-09-15 MED ORDER — CARVEDILOL 25 MG PO TABS
25.0000 mg | ORAL_TABLET | Freq: Two times a day (BID) | ORAL | 3 refills | Status: AC
  Filled 2024-09-15 – 2024-09-20 (×2): qty 180, 90d supply, fill #0

## 2024-09-15 NOTE — Progress Notes (Signed)
 09/15/2024 Name: Zachary Vazquez MRN: [id number] DOB: 1986-06-21  Chief Complaint  Patient presents with   Diabetes   Hypertension    Authur Cubit is a 38 y.o. year old male who presented for a telephone visit.   They were referred to the pharmacist by their PCP for assistance in managing diabetes. PMH includes HTN, chronic nausea/vomiting, T1DM, tobacco use disorder, marijuana use.  Subjective: Patient was most recently seen by PCP, [name], NP on 04/02/24. BP was 134/88, HR 78. Patient reported doing well. No medication changes were made. He has been following with Dr. Mercie for endocrinology, with last visit on 03/17/24. He was started on OmniPod in April 2025. Patient was last engaged by pharmacy via telephone on 04/14/24. At this time, patient reported doing well on his Omnipod, despite having some connectivity issues between the Dexcom and Omnipod. Issues resolved now that he is placing them in the same line of site. After the appt, I contacted Dr. Mercie and the [facility] to discuss concerns related to ICR of 1:1 programmed into his Omnipod, but did not receive a response. He did go to the ED on 05/26/24 for nausea and vomiting (chronic issue). His labs were not consistent with DKA. He was provided with prescriptions for promethazine  and ondansetron  PRN. He followed up with his PCP on 06/07/24. BMP stable, but Scr slightly worsened from previous. UACR improved from 559 mg/dL to 33 mg/dL. Patient had recent appt with GI to discuss chronic nausea/vomiting. He was instructed to start Linzess , continue ondansetron  PRN, and stop pantoprazole . At pharmacy call on 07/02/24, patient was doing well with Dexcom and Omnipod. Helped him reschedule with endocrinology. He saw Dr. Mercie (Endo) on 07/12/24. ISF was adjusted to 1:35 and he was advised to dose prandial insulin  2-10 units with meals based on meal size. He was scheduled with nutrition for more education on carb counting, but did not attend  this appt.  Today, patient reports doing well. We connected him to the [facility] Clarity account. He reports he would like to reschedule his nutrition appt. Continues to be concerned that Medicaid is going to expire, but he has not been able to get an answer from the DSS office despite calling multiple times.    Care Team: Primary Care Provider: Borer Bascom RAMAN, NP ; Next Scheduled Visit: 12/10/24 Endocrinologist Dr. Sudan Thapa, MD; Next Scheduled Visit: 10/11/24   Medication Access/Adherence  Current Pharmacy:  [facility] [address], [address] [place] [address] Phone: [phone] Fax: [phone]   Patient reports affordability concerns with their medications: No  Patient reports access/transportation concerns to their pharmacy: No  Patient reports adherence concerns with their medications:  No     Diabetes:  Current medications: insulin  aspart (Novolog ) - use up to 70 units per day via insulin  pump, OmniPod 5 Medications tried in the past: prior basal/bolus insulin  pen regimen: Lantus  28-30 units at bedtime. Novolog  3-8 units with meals 2-3 times per day.   Diabetes was diagnosed at age 52  OmniPod settings per Endocrinology: Basal 12AM: 0.9 units/hr 7AM: 1.1 units/hr 10PM: 0.9 units/hr  BG Target: 110 mg/dL ISF: 8:64 ICR: 1:1 (per [facility] report 03/17/24) - per DM educator note on 02/11/24: I/C: 1 (patient to put in units of insulin  into the carb box of the bolus calculator) due to not feeling comfortable carb counting yet  Current glucose readings: Download 09/15/24     Per [facility] report uploaded by endo  on 07/12/24 - Average daily carbs entered: 10.6 (aka 10.6 units, since he has a carb ratio of 1:1 in his OmniPod) - Average total daily insulin : 22.4 units, basal 65%, bolus 35% - Automated mode 72%, manual mode 28%  Patient reports hypoglycemic s/sx including dizziness, shakiness, sweating which he feels has  been associated with miscounting carbohydrates or over-correcting, but feels he is improving with this. Patient denies hyperglycemic symptoms including polyuria, polydipsia, polyphagia, nocturia, neuropathy, blurred vision.  Current medication access support: Medicaid insurance  Hypertension:  Current medications: carvedilol  25 mg BID Medications previously tried: lisinopril  - stopped during hospitalization 2/2 AKI  Patient has a validated, automated, upper arm home BP cuff Current blood pressure readings readings: not checking, but is willing to start checking more regularly  Patient denies hypotensive s/sx including dizziness, lightheadedness.  Patient denies hypertensive symptoms including headache, chest pain, shortness of breath  Hyperlipidemia/ASCVD Risk Reduction  Current lipid lowering medications: none Medications tried in the past: none  No known cardiac family hx  Clinical ASCVD: No  The ASCVD Risk score (Arnett DK, et al., 2019) failed to calculate for the following reasons:   The 2019 ASCVD risk score is only valid for ages 56 to 65    Objective:  BP Readings from Last 3 Encounters:  09/09/24 128/86  07/12/24 110/60  06/29/24 120/88    Lab Results  Component Value Date   HGBA1C 7.4 (A) 07/12/2024   HGBA1C 7.4 (A) 04/02/2024   HGBA1C 6.5 (A) 02/02/2024    Lab Results  Component Value Date   CREATININE 1.77 (H) 06/07/2024   BUN 18 06/07/2024   NA 135 06/07/2024   K 4.3 06/07/2024   CL 98 06/07/2024   CO2 21 06/07/2024    Lab Results  Component Value Date   CHOL 190 10/12/2021   HDL 58 10/12/2021   LDLCALC 121 (H) 10/12/2021   TRIG 57 10/12/2021   CHOLHDL 3.3 10/12/2021    Medications Reviewed Today     Reviewed by [name], RPH (Pharmacist) on 09/15/24 at 1101  Med List Status: <None>   Medication Order Taking? Sig Documenting Provider Last Dose Status Informant  acetaminophen  (TYLENOL ) 325 MG tablet [id number]  Take 2 tablets (650 mg  total) by mouth every 6 (six) hours as needed for mild pain (or Fever >/= 101). Romelle Booty, MD  Active Self           Med Note MARZETTA, [name]   Thu Sep 11, 2023  3:44 PM) For headaches  Blood Pressure Monitoring (BLOOD PRESSURE CUFF) MISC [id number]  Use to monitor blood pressure at least 3 times per week. Oley Bascom RAMAN, NP  Active   carvedilol  (COREG ) 25 MG tablet [id number] Yes Take 1 tablet (25 mg total) by mouth 2 (two) times daily with a meal. Oley Bascom RAMAN, NP  Active   Continuous Glucose Sensor (DEXCOM G7 SENSOR) MISC [id number] Yes Use as directed for continuous blood sugar monitoring. Change sensor every 10 days. Oley Bascom RAMAN, NP  Active   Glucagon  (BAQSIMI  ONE PACK) 3 MG/DOSE POWD [id number]  Place 1 Device into the nose as needed (Low blood sugar with impaired consciousness). Motwani, Komal, MD  Active   insulin  aspart (NOVOLOG ) 100 UNIT/ML injection [id number]  Use up to 70 units/day via insulin  pump. Thapa, Sudan, MD  Active   Insulin  Disposable Pump (OMNIPOD 5 DEXG7G6 PODS GEN 5) MISC [id number]  Apply route every 3 (three) days. Thapa, Sudan, MD  Active   insulin  glargine (  LANTUS ) 100 UNIT/ML injection [id number]  Inject 0.28 mLs (28 Units total) into the skin at bedtime. This is a backup prescription in case of insulin  pump failure. Oley Bascom RAMAN, NP  Active   linaclotide  (LINZESS ) 72 MCG capsule [id number]  Take 1 capsule (72 mcg total) by mouth daily before breakfast. May, Deanna J, NP  Active   Magnesium  Chloride 64 MG TABS [id number]  Take 1 tablet (535 mg total) by mouth once daily for 5 days Each tablet contains 64 mg magnesium  = 535 mg magnesium  chloride   Active   ondansetron  (ZOFRAN -ODT) 4 MG disintegrating tablet [id number]  Take 1 tablet (4 mg total) by mouth daily as needed for refractory nausea / vomiting. May, Deanna J, NP  Active   polyethylene glycol powder (GLYCOLAX /MIRALAX ) 17 GM/SCOOP powder [id number]  Mix 1capful (17 g) with 4-8 ounces with water/juice by mouth  2 (two) times daily as needed. Oley Bascom RAMAN, NP  Active Self            Assessment/Plan:   Diabetes: - Currently uncontrolled with last A1C of 7.4% above goal < 7%, but 2 week time in range of 70% is close to goal > 70%. Patient continuing to improve with understanding of how to administer correctional and prandial insulin  with his OmniPod pump. Encouraged him to reschedule nutrition appt for carbohydrate counting education.  - Reviewed long term cardiovascular and renal outcomes of uncontrolled blood sugar - Reviewed goal A1c, goal fasting, and goal 2 hour post prandial glucose - Recommend to continue insulin  aspart (Novolog ) up to 70 units per day via OmniPod 5 - Will leave backup Rx for Lantus  28 units daily on medication list - Recommend to check glucose continously with Dexcom G7 CGM. Connected to [facility] Clarity account today - Encouraged to follow-up with endo as scheduled   Hypertension: - Currently  uncontrolled with last BP above goal < 130/80 mmHg. Repeat UACR was much better controlled at 33 mg/g, just slightly above 30 mg/g. Lisinopril  was previously d/c while he had an AKI during previous hospitalization (in the setting of dehydration with DKA). Likely appropriate to resume at a low dose. Previously deferred initiation because patient was supposed to see nephrology, but he has not scheduled this yet. Will follow-up on referral today. Encouraged patient to check home blood pressures, and if elevated, can consider re-starting low dose ARB for HTN and kidney protection at follow-up with close lab monitoring.  - Reviewed long term cardiovascular and renal outcomes of uncontrolled blood pressure - Reviewed appropriate blood pressure monitoring technique and reviewed goal blood pressure. Patient to send me home readings if they are consistently above 130/80 mmHg. - Recommend to continue carvedilol  25 mg BID    Hyperlipidemia/ASCVD Risk Reduction: - Currently uncontrolled with last  LDL-C of 120 mg/dL above goal < 899 mg/dL with U8IF. Due for repeat lipid panel. - Reviewed long term complications of uncontrolled cholesterol - Repeat lipid panel at next PCP appt    Follow Up Plan: Pharmacist telephone 11/02/24, Endo 10/11/24, PCP 12/03/24   Lorain Baseman, PharmD [facility] [phone]

## 2024-09-20 ENCOUNTER — Other Ambulatory Visit: Payer: Self-pay

## 2024-09-28 ENCOUNTER — Ambulatory Visit: Admitting: Gastroenterology

## 2024-10-11 ENCOUNTER — Ambulatory Visit: Payer: Self-pay | Admitting: Endocrinology

## 2024-10-11 ENCOUNTER — Ambulatory Visit (INDEPENDENT_AMBULATORY_CARE_PROVIDER_SITE_OTHER): Admitting: Endocrinology

## 2024-10-11 ENCOUNTER — Encounter: Payer: Self-pay | Admitting: Endocrinology

## 2024-10-11 VITALS — BP 122/82 | HR 79 | Resp 16 | Ht 71.0 in | Wt 172.2 lb

## 2024-10-11 DIAGNOSIS — E1065 Type 1 diabetes mellitus with hyperglycemia: Secondary | ICD-10-CM

## 2024-10-11 LAB — POCT GLYCOSYLATED HEMOGLOBIN (HGB A1C): Hemoglobin A1C: 7.4 % — AB (ref 4.0–5.6)

## 2024-10-11 NOTE — Progress Notes (Signed)
 Outpatient Endocrinology Note Magdelena Kinsella, MD   Patient's Name: Zachary Vazquez    DOB: 1986-09-07    MRN: [id number]                                                    REASON OF VISIT: Follow up for type 1 diabetes mellitus  REFERRING PROVIDER: Oley Bascom RAMAN, NP  PCP: Oley Bascom RAMAN, NP  HISTORY OF PRESENT ILLNESS:   Zachary Vazquez is a 38 y.o. old male with past medical history listed below, is here for follow up for type 1 diabetes mellitus.   Pertinent Diabetes History: Patient was diagnosed with type 1 diabetes mellitus in 2012 at the age of 15 years.  History of DKA or diabetes related hospitalizations: yes  Previous diabetes education: Yes   Family h/o diabetes mellitus: brother with type 1 diabetes mellitus.    He has fair control of type 1 diabetes mellitus.  Insulin  pump therapy OmniPod 5 was started in April 2025.  Chronic Diabetes Complications : Retinopathy: no. Last ophthalmology exam was done on annually, reportedly. Nephropathy: yes, urine microalbuminuria present. AKI on ? CKD.  Used to be on lisinopril  was discontinued in the past due to AKI. Peripheral neuropathy: yes Coronary artery disease: no Stroke: no  Relevant comorbidities and cardiovascular risk factors: Obesity: no Body mass index is 24.02 kg/m.  Hypertension: Yes  Hyperlipidemia : Yes, not on statin   Current / Home Diabetic regimen includes:  OmniPod 5 with Dexcom G7, using NovoLog  U100  Basal : 23.1 units/day  12 AM: 0.9 units/hr 7 AM: 1.1  10 PM: 0.9  Sensitivity/correction factor  12 AM : 1:35  Carb ratio : 12 AM : 1:1, bolusing carb count 2-10  Active insulin  time 4 hours  Target glucose 110  Prior diabetic medications: Basal bolus regimen prior to insulin  pump therapy Lantus  28-30 units at bedtime. Novolog  adjust based on meal / glucose 3-8 units with meals 2-3 times a day.    CONTINUOUS GLUCOSE MONITORING SYSTEM (CGMS) INTERPRETATION:                           OmniPod 5 Pump & Sensor DEXCOM G7 Download (Reviewed and summarized below.)  Dates: December 2 to December 15 , 2025, 14 days   Average daily carbs entered: 16.5g Average total daily insulin :45 units, Basal: 54 %, Food Bolus: 46%  Automated mode 55%, manual mode 45%    Trends:  Variable blood sugar with random hyperglycemia postprandially usually mild with blood sugar up to 250 range and occasionally up to 350-400 range including at night.  Hyperglycemia mostly related to late meal bolus and not enough meal bolus.  He has not been carb counting and bolusing based on experience and meal size.  He has been exiting out of auto mode frequently due to hyperglycemia and is staying on manual mode for several hours.  No concerning hypoglycemia.  Hypoglycemia: Patient has minor hypoglycemic episodes. Patient has hypoglycemia awareness, has a glucagon  ER kit.  Factors modifying glucose control: 1.  Diabetic diet assessment: 2-3 meals a day.  2.  Staying active or exercising: Physically active at work, works at [employer].  3.  Medication compliance: compliant most of the time.  Interval history  Pump and CGM data as reviewed above.  Variable blood sugar is still with postprandial hyperglycemia.  He reports sometime while sleeping due to pressure, sensor is not reading accurate glucose.  He has been using pump and sensor on the same side of the body.  Hemoglobin A1c 7.4%.  He still does not feel comfortable carb counting and reports he has a diabetic educator/dietitian visit next month.  No other complaints today.  REVIEW OF SYSTEMS As per history of present illness.   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    History of delayed wound healing 10/2019   Tetrahydrocannabinol (THC) use disorder, severe, dependence (HCC)    Type 1 diabetes mellitus (HCC)    Vitamin D  deficiency 09/2020     PAST SURGICAL HISTORY: Past Surgical History:  Procedure Laterality Date   TONSILLECTOMY      ALLERGIES: Allergies  Allergen Reactions   Clindamycin/Lincomycin Anaphylaxis   Bactrim  [Sulfamethoxazole -Trimethoprim ] Swelling and Other (See Comments)    Eyes swell and bumps appear on/near the eyes     FAMILY HISTORY:  Family History  Problem Relation Age of Onset   Hypertension Mother    Diabetes Mellitus I Brother    Colon cancer Paternal Grandmother     SOCIAL HISTORY: Social History   Socioeconomic History   Marital status: Single    Spouse name: Not on file   Number of children: Not on file   Years of education: Not on file   Highest education level: Not on file  Occupational History   Not on file  Tobacco Use   Smoking status: Every Day    Current packs/day: 1.00    Average packs/day: 1 pack/day for 15.0 years (15.0 ttl pk-yrs)    Types: Cigarettes   Smokeless tobacco: Never  Vaping Use   Vaping status: Never Used  Substance and Sexual Activity   Alcohol use: Yes    Comment: occ   Drug use: Yes    Types: Marijuana    Comment: occ   Sexual activity: Yes    Birth control/protection: None  Other Topics Concern   Not on file  Social History Narrative   Not on file   Social Drivers of Health   Tobacco Use: High Risk (10/11/2024)   Patient History    Smoking Tobacco Use: Every Day    Smokeless Tobacco Use: Never    Passive Exposure: Not on file  Financial Resource Strain: Not on file  Food Insecurity: No Food Insecurity (06/01/2024)   Epic    Worried About Programme Researcher, Broadcasting/film/video in the Last Year: Never true    [employer] of Food in the Last Year: Never true  Transportation Needs: No Transportation Needs (06/01/2024)   Epic    Lack of Transportation (Medical): No    Lack of Transportation (Non-Medical): No  Physical Activity: Not on file  Stress: Not on file  Social Connections: Not on file  Depression (PHQ2-9): Low Risk (04/02/2024)   Depression  (PHQ2-9)    PHQ-2 Score: 0  Alcohol Screen: Not on file  Housing: Unknown (06/01/2024)   Epic    Unable to Pay for Housing in the Last Year: No    Number of Times Moved in the Last Year: Not on file    Homeless in the Last Year: No  Utilities: Not At Risk (05/17/2023)   [facility] Utilities    Threatened with loss of utilities: No  Health Literacy: Not on file  MEDICATIONS:  Current Outpatient Medications  Medication Sig Dispense Refill   acetaminophen  (TYLENOL ) 325 MG tablet Take 2 tablets (650 mg total) by mouth every 6 (six) hours as needed for mild pain (or Fever >/= 101).     Blood Pressure Monitoring (BLOOD PRESSURE CUFF) MISC Use to monitor blood pressure at least 3 times per week. 1 each 0   carvedilol  (COREG ) 25 MG tablet Take 1 tablet (25 mg total) by mouth 2 (two) times daily with a meal. 180 tablet 3   Continuous Glucose Sensor (DEXCOM G7 SENSOR) MISC Use as directed for continuous blood sugar monitoring. Change sensor every 10 days. 9 each 3   Glucagon  (BAQSIMI  ONE PACK) 3 MG/DOSE POWD Place 1 Device into the nose as needed (Low blood sugar with impaired consciousness). 2 each 3   insulin  aspart (NOVOLOG ) 100 UNIT/ML injection Use up to 70 units/day via insulin  pump. 30 mL 4   Insulin  Disposable Pump (OMNIPOD 5 DEXG7G6 PODS GEN 5) MISC Apply route every 3 (three) days. 30 each 3   insulin  glargine (LANTUS ) 100 UNIT/ML injection Inject 0.28 mLs (28 Units total) into the skin at bedtime. This is a backup prescription in case of insulin  pump failure. 10 mL 11   linaclotide  (LINZESS ) 72 MCG capsule Take 1 capsule (72 mcg total) by mouth daily before breakfast. 30 capsule 2   Magnesium  Chloride 64 MG TABS Take 1 tablet (535 mg total) by mouth once daily for 5 days Each tablet contains 64 mg magnesium  = 535 mg magnesium  chloride 5 tablet 0   ondansetron  (ZOFRAN -ODT) 4 MG disintegrating tablet Take 1 tablet (4 mg total) by mouth daily as needed for refractory nausea / vomiting. 30 tablet 0    polyethylene glycol powder (GLYCOLAX /MIRALAX ) 17 GM/SCOOP powder Mix 1capful (17 g) with 4-8 ounces with water/juice by mouth 2 (two) times daily as needed. 510 g 1   No current facility-administered medications for this visit.    PHYSICAL EXAM: Vitals:   10/11/24 1105  BP: 122/82  Pulse: 79  Resp: 16  SpO2: 99%  Weight: 172 lb 3.2 oz (78.1 kg)  Height: 5' 11 (1.803 m)     Body mass index is 24.02 kg/m.  Wt Readings from Last 3 Encounters:  10/11/24 172 lb 3.2 oz (78.1 kg)  09/09/24 167 lb (75.8 kg)  07/12/24 170 lb (77.1 kg)    General: Well developed, well nourished male in no apparent distress.  HEENT: AT/[place], no external lesions.  Eyes: Conjunctiva clear and no icterus. Neck: Neck supple  Lungs: Respirations not labored Neurologic: Alert, oriented, normal speech Extremities / Skin: Dry.  Psychiatric: Does not appear depressed or anxious  Diabetic Foot Exam - Simple   No data filed    LABS Reviewed Lab Results  Component Value Date   HGBA1C 7.4 (A) 10/11/2024   HGBA1C 7.4 (A) 07/12/2024   HGBA1C 7.4 (A) 04/02/2024   No results found for: FRUCTOSAMINE Lab Results  Component Value Date   CHOL 190 10/12/2021   HDL 58 10/12/2021   LDLCALC 121 (H) 10/12/2021   TRIG 57 10/12/2021   CHOLHDL 3.3 10/12/2021   Lab Results  Component Value Date   MICRALBCREAT 33 (H) 06/07/2024   MICRALBCREAT 559 (H) 12/18/2022   Lab Results  Component Value Date   CREATININE 1.77 (H) 06/07/2024   No results found for: GFR  ASSESSMENT / PLAN  1. Uncontrolled type 1 diabetes mellitus with hyperglycemia (HCC)    Diabetes Mellitus type 1, complicated by  diabetic neuropathy, microalbuminuria - Diabetic status / severity: Uncontrolled  Lab Results  Component Value Date   HGBA1C 7.4 (A) 10/11/2024    - Hemoglobin A1c goal : <6.5%  OmniPod 5 insulin  pump therapy was started in April 2025.  Postprandial hyperglycemia related to late meal bolus, no meal bolus and not  enough meal bolus.  - Medications: Diabetes regimen.  No change in the pump setting.  Advised patient to bolus for meal 10 to 15 minutes before eating for all the meals.  He will have better blood sugar once he learns carb counting and bolusing for meals accordingly.  OmniPod 5 with Dexcom G7, using NovoLog  U100  Basal  12 AM: 0.9 units/hr 7 AM: 1.0 10 PM: 0.9  Sensitivity/correction factor  12 AM : 1:35  Carb ratio : 12 AM : 1:1  Advised to use carb count 2-10, based on meal size.  Advised to bolus 10 to 15 units before eating.  Active insulin  time 4 hours  Target glucose 110  He reports diabetic educator visit in the near future for carb counting training.  Referred to diabetic educator in the last visit. We can also do custom food carb counting small meal 30 gram, medium meal 60 g and large meal 90 g.  In that case we can do carb ratio of 1: 10.  For now we will keep the same carb ratio until he sees diabetic educator for learning carb counting.  - Home glucose testing: CGM and check as needed.  - Discussed/ Gave Hypoglycemia treatment plan.  # Consult : Refer to diabetic educator during last visit.  # Annual urine for microalbuminuria/ creatinine ratio, + microalbuminuria currently.  He had AKI on ? CKD patient reports he has plan to see nephrology in the near future for evaluation of renal insufficiency.  He used to be on lisinopril  was stopped in the past due to AKI and changed to carvedilol  for blood pressure control.  He has improvement on urine microalbumin creatinine ratio.  Will hold off on restarting lisinopril  until evaluated by ? nephrology.  Last  Lab Results  Component Value Date   MICRALBCREAT 33 (H) 06/07/2024    # Foot check nightly / neuropathy.  # Annual dilated diabetic eye exams.   - Diet: Make healthy diabetic food choices - Life style / activity / exercise: Discussed.  2. Blood pressure  -  BP Readings from Last 1 Encounters:  10/11/24  122/82    - Control is in target.  - No change in current plans.  3. Lipid status / Hyperlipidemia - Last  Lab Results  Component Value Date   LDLCALC 121 (H) 10/12/2021   -Currently not on statin therapy.   Diagnoses and all orders for this visit:  Uncontrolled type 1 diabetes mellitus with hyperglycemia (HCC) -     POCT glycosylated hemoglobin (Hb A1C)    DISPOSITION Follow up in clinic in 3 - 4 months suggested.   All questions answered and patient verbalized understanding of the plan.  Mackinzee Roszak, MD [facility] Endocrinology [facility] [address] [place], [address] [place], [place] [address] Phone # [phone]  At least part of this note was generated using voice recognition software. Inadvertent word errors may have occurred, which were not recognized during the proofreading process.

## 2024-10-19 ENCOUNTER — Other Ambulatory Visit: Payer: Self-pay

## 2024-11-02 ENCOUNTER — Other Ambulatory Visit (INDEPENDENT_AMBULATORY_CARE_PROVIDER_SITE_OTHER): Payer: Self-pay

## 2024-11-02 ENCOUNTER — Other Ambulatory Visit: Payer: Self-pay | Admitting: Gastroenterology

## 2024-11-02 ENCOUNTER — Other Ambulatory Visit: Payer: Self-pay

## 2024-11-02 DIAGNOSIS — K5904 Chronic idiopathic constipation: Secondary | ICD-10-CM

## 2024-11-02 DIAGNOSIS — I152 Hypertension secondary to endocrine disorders: Secondary | ICD-10-CM

## 2024-11-02 DIAGNOSIS — E1159 Type 2 diabetes mellitus with other circulatory complications: Secondary | ICD-10-CM

## 2024-11-02 MED ORDER — LINACLOTIDE 72 MCG PO CAPS
72.0000 ug | ORAL_CAPSULE | Freq: Every day | ORAL | 2 refills | Status: DC
  Filled 2024-11-02: qty 30, 30d supply, fill #0

## 2024-11-02 MED ORDER — LOSARTAN POTASSIUM 25 MG PO TABS
12.5000 mg | ORAL_TABLET | Freq: Every day | ORAL | 1 refills | Status: AC
  Filled 2024-11-02: qty 45, 90d supply, fill #0

## 2024-11-02 NOTE — Progress Notes (Signed)
 "  11/02/2024 Name: Zachary Vazquez MRN: [id number] DOB: 06/22/1986  No chief complaint on file.   Zachary Vazquez is a 39 y.o. year old male who presented for a telephone visit.   They were referred to the pharmacist by their PCP for assistance in managing diabetes. PMH includes HTN, chronic nausea/vomiting, T1DM, tobacco use disorder, marijuana use.  Subjective: Patient was most recently seen by PCP, [name], NP on 09/09/24. BP was 128/86 mmHg. No changes made. At pharmacy call on 09/15/24, connected patient to [facility] account. Encouraged follow-up with endocrinology, nephrology, and nutrition. He saw Dr. Mercie (Endo) on 10/11/24. No changes were made. He was encouraged to follow-up with nutrition for carb counting. A1C was stable at 7.4%.   Today, patient reports doing well. He has not reached out to nutrition yet, is willing to write down their phone number. Monitoring BP. DBP consistently slightly over 80 mmHg. No dizziness or lightheadedness. Willing to restart ACEi or ARB for kidney protection, as he has declined reaching out to nephrology to schedule appt at this time. Will encourage re-referral in the future.   Care Team: Primary Care Provider: Borer Bascom RAMAN, NP ; Next Scheduled Visit: 12/10/24 Endocrinologist Dr. Sudan Thapa, MD; Next Scheduled Visit: 02/09/25   Medication Access/Adherence  Current Pharmacy:  [facility] [address], [address] [place] [address] Phone: [phone] Fax: [phone]   Patient reports affordability concerns with their medications: No  Patient reports access/transportation concerns to their pharmacy: No  Patient reports adherence concerns with their medications:  No     Diabetes:  Current medications: insulin  aspart (Novolog ) - use up to 70 units per day via insulin  pump, OmniPod 5 Medications tried in the past: prior basal/bolus insulin  pen regimen: Lantus  28-30  units at bedtime. Novolog  3-8 units with meals 2-3 times per day.   Diabetes was diagnosed at age 62  OmniPod settings per Endocrinology: Basal  12AM: 0.9 units/hr 7AM: 1.1 units/hr 10PM: 0.9 units/hr  BG Target: 110 mg/dL ISF: 8:64 ICR: 1:1 (per [facility] report 03/17/24) - per DM educator note on 02/11/24: I/C: 1 (patient to put in units of insulin  into the carb box of the bolus calculator) due to not feeling comfortable carb counting yet  Current glucose readings: Download 11/02/24     Per [facility] report uploaded by endo on 10/11/24 - Average daily carbs entered: 16.5 (aka 16.5 units, since he has a carb ratio of 1:1 in his OmniPod) - Average total daily insulin : 45units, basal 54%, bolus 46% - Automated mode 55%, manual mode 45%  Patient reports hypoglycemic s/sx including dizziness, shakiness, sweating which he feels has been associated with miscounting carbohydrates or over-correcting, but feels he is improving with this. Patient denies hyperglycemic symptoms including polyuria, polydipsia, polyphagia, nocturia, neuropathy, blurred vision.  Current medication access support: Medicaid insurance  Hypertension:  Current medications: carvedilol  25 mg BID Medications previously tried: lisinopril  - stopped during hospitalization 2/2 AKI  Patient has a validated, automated, upper arm home BP cuff Current blood pressure readings readings: averaging 120s/80s  Patient denies hypotensive s/sx including dizziness, lightheadedness.  Patient denies hypertensive symptoms including headache, chest pain, shortness of breath  Hyperlipidemia/ASCVD Risk Reduction  Current lipid lowering medications: none Medications tried in the past: none  No known cardiac family hx  Clinical ASCVD: No  The ASCVD Risk score (Arnett DK, et al., 2019) failed to calculate for the following reasons:   The 2019 ASCVD risk score  is only valid for ages 3 to 68   * - Cholesterol units were assumed     Objective:  BP Readings from Last 3 Encounters:  10/11/24 122/82  09/09/24 128/86  07/12/24 110/60    Lab Results  Component Value Date   HGBA1C 7.4 (A) 10/11/2024   HGBA1C 7.4 (A) 07/12/2024   HGBA1C 7.4 (A) 04/02/2024    Lab Results  Component Value Date   CREATININE 1.77 (H) 06/07/2024   BUN 18 06/07/2024   NA 135 06/07/2024   K 4.3 06/07/2024   CL 98 06/07/2024   CO2 21 06/07/2024    Lab Results  Component Value Date   CHOL 190 10/12/2021   HDL 58 10/12/2021   LDLCALC 121 (H) 10/12/2021   TRIG 57 10/12/2021   CHOLHDL 3.3 10/12/2021    Medications Reviewed Today     Reviewed by [name], RPH-CPP (Pharmacist) on 11/02/24 at 1121  Med List Status: <None>   Medication Order Taking? Sig Documenting Provider Last Dose Status Informant  acetaminophen  (TYLENOL ) 325 MG tablet [id number]  Take 2 tablets (650 mg total) by mouth every 6 (six) hours as needed for mild pain (or Fever >/= 101). Romelle Booty, MD  Active Self           Med Note MARZETTA, [name]   Thu Sep 11, 2023  3:44 PM) For headaches  Blood Pressure Monitoring (BLOOD PRESSURE CUFF) MISC [id number]  Use to monitor blood pressure at least 3 times per week. Oley Bascom RAMAN, NP  Active   carvedilol  (COREG ) 25 MG tablet [id number] Yes Take 1 tablet (25 mg total) by mouth 2 (two) times daily with a meal. Oley Bascom RAMAN, NP  Active   Continuous Glucose Sensor (DEXCOM G7 SENSOR) MISC [id number] Yes Use as directed for continuous blood sugar monitoring. Change sensor every 10 days. Nichols, Tonya S, NP  Active   Glucagon  (BAQSIMI  ONE PACK) 3 MG/DOSE POWD [id number]  Place 1 Device into the nose as needed (Low blood sugar with impaired consciousness). Motwani, Komal, MD  Active   insulin  aspart (NOVOLOG ) 100 UNIT/ML injection [id number]  Use up to 70 units/day via insulin  pump. Thapa, Sudan, MD  Active   Insulin  Disposable Pump (OMNIPOD 5 DEXG7G6 PODS GEN 5) MISC [id number]  Apply route every 3 (three) days. Thapa,  Sudan, MD  Active   insulin  glargine (LANTUS ) 100 UNIT/ML injection [id number]  Inject 0.28 mLs (28 Units total) into the skin at bedtime. This is a backup prescription in case of insulin  pump failure. Oley Bascom RAMAN, NP  Active   linaclotide  (LINZESS ) 72 MCG capsule [id number] Yes Take 1 capsule (72 mcg total) by mouth daily before breakfast. May, Deanna J, NP  Active   Magnesium  Chloride 64 MG TABS [id number]  Take 1 tablet (535 mg total) by mouth once daily for 5 days Each tablet contains 64 mg magnesium  = 535 mg magnesium  chloride   Active   ondansetron  (ZOFRAN -ODT) 4 MG disintegrating tablet [id number]  Take 1 tablet (4 mg total) by mouth daily as needed for refractory nausea / vomiting. May, Deanna J, NP  Active   polyethylene glycol powder (GLYCOLAX /MIRALAX ) 17 GM/SCOOP powder [id number]  Mix 1capful (17 g) with 4-8 ounces with water/juice by mouth 2 (two) times daily as needed. Oley Bascom RAMAN, NP  Active Self            Assessment/Plan:   Diabetes: - Currently uncontrolled with last A1C of 7.4% above goal < 7%, but 2 week  time in range of 66% is close to goal > 70%. Expect variability to improve with introduction of carb counting. Encouraged him to reschedule nutrition appt for carbohydrate counting education.  - Reviewed long term cardiovascular and renal outcomes of uncontrolled blood sugar - Reviewed goal A1c, goal fasting, and goal 2 hour post prandial glucose - Recommend to continue insulin  aspart (Novolog ) up to 70 units per day via OmniPod 5 - Will leave backup Rx for Lantus  28 units daily on medication list - Recommend to check glucose continously with Dexcom G7 CGM.  - Provided with telephone number to schedule with nutrition   Hypertension: - Currently  uncontrolled with last BP slightly above goal < 130/80 mmHg. Repeat UACR was much better controlled at 33 mg/g, just slightly above 30 mg/g. Lisinopril  was previously d/c while he had an AKI during previous hospitalization  (in the setting of dehydration with DKA). Appropriate to resume ACEi or ARB at a low dose. Previously deferred initiation because patient was supposed to see nephrology, but he has not scheduled this yet.  - Reviewed long term cardiovascular and renal outcomes of uncontrolled blood pressure - Reviewed appropriate blood pressure monitoring technique and reviewed goal blood pressure.  - Recommend to continue carvedilol  25 mg BID  - Recommend to START losartan  12.5 mg daily.  - Repeat BMP in 3-4 weeks to monitor Scr and potassium   Hyperlipidemia/ASCVD Risk Reduction: - Currently uncontrolled with last LDL-C of 120 mg/dL above goal < 899 mg/dL with U8IF. Due for repeat lipid panel. - Reviewed long term complications of uncontrolled cholesterol - Repeat lipid panel at next PCP appt   Collaborated with pharmacy to send refill request for Linzess  to GI. Patient reminded of upcoming appt.    Follow Up Plan: Pharmacist telephone 12/21/24, PCP 12/10/24, Endo 02/09/25   Lorain Baseman, PharmD [facility] [phone]   "

## 2024-11-05 ENCOUNTER — Ambulatory Visit: Admitting: Gastroenterology

## 2024-11-05 ENCOUNTER — Encounter: Payer: Self-pay | Admitting: Gastroenterology

## 2024-11-05 ENCOUNTER — Other Ambulatory Visit: Payer: Self-pay

## 2024-11-05 VITALS — BP 110/76 | HR 76 | Ht 70.25 in | Wt 176.0 lb

## 2024-11-05 DIAGNOSIS — R112 Nausea with vomiting, unspecified: Secondary | ICD-10-CM

## 2024-11-05 DIAGNOSIS — K5904 Chronic idiopathic constipation: Secondary | ICD-10-CM | POA: Diagnosis not present

## 2024-11-05 MED ORDER — LINACLOTIDE 72 MCG PO CAPS
72.0000 ug | ORAL_CAPSULE | Freq: Every day | ORAL | 2 refills | Status: AC
  Filled 2024-11-05 – 2024-12-01 (×2): qty 90, 90d supply, fill #0

## 2024-11-05 NOTE — Patient Instructions (Addendum)
 Nausea Use ondansetron  prn  Continue gastroparesis diet  Constipation Continue Linzess  72 mcg po daily , take 1 capsule 30-45 minutes before breakfast with full glass of water  Would recommend following up with dietitian  as we discussed  If symptoms continue can consider following:  Gastric emptying study 4 hour  Endoscopy Colonoscopy    _______________________________________________________  If your blood pressure at your visit was 140/90 or greater, please contact your primary care physician to follow up on this.  _______________________________________________________  If you are age 49 or older, your body mass index should be between 23-30. Your Body mass index is 25.07 kg/m. If this is out of the aforementioned range listed, please consider follow up with your Primary Care Provider.  If you are age 78 or younger, your body mass index should be between 19-25. Your Body mass index is 25.07 kg/m. If this is out of the aformentioned range listed, please consider follow up with your Primary Care Provider.   ________________________________________________________  The [facility] providers would like to encourage you to use MYCHART to communicate with providers for non-urgent requests or questions.  Due to long hold times on the telephone, sending your provider a message by [facility] may be a faster and more efficient way to get a response.  Please allow 48 business hours for a response.  Please remember that this is for non-urgent requests.  _______________________________________________________  [facility] is using a team-based approach to care.  Your team is made up of your doctor and two to three APPS. Our APPS (Nurse Practitioners and Physician Assistants) work with your physician to ensure care continuity for you. They are fully qualified to address your health concerns and develop a treatment plan. They communicate directly with your gastroenterologist to care for  you. Seeing the Advanced Practice Practitioners on your physician's team can help you by facilitating care more promptly, often allowing for earlier appointments, access to diagnostic testing, procedures, and other specialty referrals.   Thank you for trusting me with your gastrointestinal care. Deanna May, FNP-C

## 2024-11-05 NOTE — Progress Notes (Signed)
 "  Chief Complaint:follow-up Primary GI Doctor: Dr. Charlanne  HPI:  Patient is a  39  year old male patient with past medical history of type 1 diabetes, chronic vomiting, who presents or a evaluation of nausea, vomiting, and constipation.    Pt seen seen in the GI office on 06/13/2023 by Harlene, PA for recurrent nausea and vomiting.  At that time they recommended EGD and gastric emptying scan.  Patient could not take the time off to proceed with procedures.  Patient seen at ED on 05/26/2024 for complaints of nausea and vomiting. CBG 233 with EMS.  Had 500cc fluid and zofran  with EMS. minimal leukocytosis at 11.1. Creatinine is 1.6, similar to prior. Glucose 236 but maintains normal bicarb and anion gap. pH is normal. UA with minimal ketones but I suspect this is due to dehydration. His labs are not consistent with DKA today. Will d/c home with zofran  and phenergan  suppositories.  Interval History Patient last seen in the GI office on 06/29/2024 by myself.  Patient presents for follow-up on constipation and nausea.  Patient currently taking Linzess  72 mcg po daily which he reports some days he had no BM and other days he will have multiple BM's. He reports he typically makes sure he is near a restroom.   Patient enquires about colonic cleanses for his constipation.   Patient reports the nausea has improved some with moving his bowels more regular.  He uses ondansetron  prn.  He has also tried to change his diet some. He does snack sometimes later in day and will wake up nauseated.   Wt Readings from Last 3 Encounters:  11/05/24 176 lb (79.8 kg)  10/11/24 172 lb 3.2 oz (78.1 kg)  09/09/24 167 lb (75.8 kg)     Past Medical History:  Diagnosis Date   Cannabis hyperemesis syndrome concurrent with and due to cannabis abuse    History of delayed wound healing 10/2019   Tetrahydrocannabinol (THC) use disorder, severe, dependence (HCC)    Type 1 diabetes mellitus (HCC)    Vitamin D  deficiency  09/2020    Past Surgical History:  Procedure Laterality Date   TONSILLECTOMY      Current Outpatient Medications  Medication Sig Dispense Refill   acetaminophen  (TYLENOL ) 325 MG tablet Take 2 tablets (650 mg total) by mouth every 6 (six) hours as needed for mild pain (or Fever >/= 101).     Blood Pressure Monitoring (BLOOD PRESSURE CUFF) MISC Use to monitor blood pressure at least 3 times per week. 1 each 0   carvedilol  (COREG ) 25 MG tablet Take 1 tablet (25 mg total) by mouth 2 (two) times daily with a meal. 180 tablet 3   Continuous Glucose Sensor (DEXCOM G7 SENSOR) MISC Use as directed for continuous blood sugar monitoring. Change sensor every 10 days. 9 each 3   Glucagon  (BAQSIMI  ONE PACK) 3 MG/DOSE POWD Place 1 Device into the nose as needed (Low blood sugar with impaired consciousness). 2 each 3   insulin  aspart (NOVOLOG ) 100 UNIT/ML injection Use up to 70 units/day via insulin  pump. 30 mL 4   Insulin  Disposable Pump (OMNIPOD 5 DEXG7G6 PODS GEN 5) MISC Apply route every 3 (three) days. 30 each 3   insulin  glargine (LANTUS ) 100 UNIT/ML injection Inject 0.28 mLs (28 Units total) into the skin at bedtime. This is a backup prescription in case of insulin  pump failure. 10 mL 11   linaclotide  (LINZESS ) 72 MCG capsule Take 1 capsule (72 mcg total) by mouth daily  before breakfast. 30 capsule 2   losartan  (COZAAR ) 25 MG tablet Take 0.5 tablets (12.5 mg total) by mouth daily. 45 tablet 1   Magnesium  Chloride 64 MG TABS Take 1 tablet (535 mg total) by mouth once daily for 5 days Each tablet contains 64 mg magnesium  = 535 mg magnesium  chloride 5 tablet 0   ondansetron  (ZOFRAN -ODT) 4 MG disintegrating tablet Take 1 tablet (4 mg total) by mouth daily as needed for refractory nausea / vomiting. 30 tablet 0   polyethylene glycol powder (GLYCOLAX /MIRALAX ) 17 GM/SCOOP powder Mix 1capful (17 g) with 4-8 ounces with water/juice by mouth 2 (two) times daily as needed. 510 g 1   No current  facility-administered medications for this visit.    Allergies as of 11/05/2024 - Review Complete 11/05/2024  Allergen Reaction Noted   Clindamycin/lincomycin Anaphylaxis 10/04/2014   Bactrim  [sulfamethoxazole -trimethoprim ] Swelling and Other (See Comments) 12/31/2019    Family History  Problem Relation Age of Onset   Hypertension Mother    Diabetes Mellitus I Brother    Colon cancer Paternal Grandmother    Review of Systems:    Constitutional: No weight loss, fever, chills, weakness or fatigue HEENT: Eyes: No change in vision               Ears, Nose, Throat:  No change in hearing or congestion Skin: No rash or itching Cardiovascular: No chest pain, chest pressure or palpitations   Respiratory: No SOB or cough Gastrointestinal: See HPI and otherwise negative Genitourinary: No dysuria or change in urinary frequency Neurological: No headache, dizziness or syncope Musculoskeletal: No new muscle or joint pain Hematologic: No bleeding or bruising Psychiatric: No history of depression or anxiety    Physical Exam:  Vital signs: BP 110/76 (BP Location: Left Arm, Patient Position: Sitting, Cuff Size: Normal)   Pulse 76   Ht 5' 10.25 (1.784 m) Comment: height measured without shoes  Wt 176 lb (79.8 kg)   BMI 25.07 kg/m   Constitutional:   Pleasant male appears to be in NAD, Well developed, Well nourished, alert and cooperative Throat: Oral cavity and pharynx without inflammation, swelling or lesion.  Respiratory: Respirations even and unlabored. Lungs clear to auscultation bilaterally.   No wheezes, crackles, or rhonchi.  Cardiovascular: Normal S1, S2. Regular rate and rhythm. No peripheral edema, cyanosis or pallor.  Gastrointestinal:  Soft, nondistended, nontender. No rebound or guarding.  Hypoactive bowel sounds. No appreciable masses or hepatomegaly. Rectal:  Not performed.  Msk:  Symmetrical without gross deformities. Without edema, no deformity or joint abnormality.   Neurologic:  Alert and  oriented x4;  grossly normal neurologically.  Skin:   Dry and intact without significant lesions or rashes.  RELEVANT LABS AND IMAGING: CBC    Latest Ref Rng & Units 06/07/2024    2:45 PM 05/26/2024   10:56 PM 05/26/2024   10:48 PM  CBC  WBC 3.4 - 10.8 x10E3/uL 11.0     Hemoglobin 13.0 - 17.7 g/dL 86.8  84.9  84.6   Hematocrit 37.5 - 51.0 % 40.8  44.0  45.0   Platelets 150 - 450 x10E3/uL 247        CMP     Latest Ref Rng & Units 06/07/2024    2:45 PM 05/26/2024   10:56 PM 05/26/2024   10:48 PM  CMP  Glucose 70 - 99 mg/dL 868   759   BUN 6 - 20 mg/dL 18   22   Creatinine 9.23 - 1.27 mg/dL 8.22  1.60   Sodium 134 - 144 mmol/L 135  131  132   Potassium 3.5 - 5.2 mmol/L 4.3  5.1  5.0   Chloride 96 - 106 mmol/L 98   99   CO2 20 - 29 mmol/L 21     Calcium  8.7 - 10.2 mg/dL 9.7     Total Protein 6.0 - 8.5 g/dL 7.0     Total Bilirubin 0.0 - 1.2 mg/dL 0.4     Alkaline Phos 44 - 121 IU/L 71     AST 0 - 40 IU/L 15     ALT 0 - 44 IU/L 15        Lab Results  Component Value Date   TSH 1.780 07/15/2022  6/28 echo-The estimated ejection fraction was in the range of 60% to 65%.   Assessment: Encounter Diagnoses  Name Primary?   Chronic idiopathic constipation Yes   Nausea and vomiting, unspecified vomiting type       40 year old male patient with type 1 diabetes who presents with chronic nausea and constipation, who has been doing better with being on pro secretory Linzess  72 mcg po daily.  We discussed increasing the dose but he would like to hold off for now. [employer]gr[employer]  was not covered. We discussed testing that could be conducted to evaluate for gastric emptying and/or other causes of nausea and vomiting.  Patient can use ondansetron  as needed for nausea.  Patient would like to hold off on any procedures at this time.  If no improvement we discussed further evaluating with upper GI endoscopy and/or gastric emptying study. Also recommend he make appt with  dietitian for help with his diet.  Plan: -Continue Linzess  72 mcg po daily , refilled  -Continue ondansetron  4mg  prn  -recommend scheduling appointment with dietitian -follow-up with Dr. Charlanne in 3-48mths   Thank you for the courtesy of this consult. Please call me with any questions or concerns.   Zachary Lemoine, FNP-C [facility] 11/05/2024, 3:26 PM  Cc: Oley Bascom RAMAN, NP  "

## 2024-11-24 ENCOUNTER — Other Ambulatory Visit: Payer: Self-pay

## 2024-12-01 ENCOUNTER — Other Ambulatory Visit: Payer: Self-pay

## 2024-12-02 ENCOUNTER — Other Ambulatory Visit: Payer: Self-pay

## 2024-12-03 ENCOUNTER — Other Ambulatory Visit: Payer: Self-pay

## 2024-12-10 ENCOUNTER — Ambulatory Visit: Payer: Self-pay | Admitting: Nurse Practitioner

## 2024-12-21 ENCOUNTER — Other Ambulatory Visit: Payer: Self-pay

## 2025-02-09 ENCOUNTER — Ambulatory Visit: Admitting: Endocrinology
